# Patient Record
Sex: Female | Born: 1960 | Race: Black or African American | Hispanic: No | Smoking: Never smoker
Health system: Southern US, Community
[De-identification: ages and names within clinical notes are randomized; demographics above are authoritative.]

## PROBLEM LIST (undated history)

## (undated) ENCOUNTER — Emergency Department (HOSPITAL_COMMUNITY): Payer: Self-pay | Source: Home / Self Care

## (undated) DIAGNOSIS — Z9289 Personal history of other medical treatment: Secondary | ICD-10-CM

## (undated) DIAGNOSIS — I2510 Atherosclerotic heart disease of native coronary artery without angina pectoris: Secondary | ICD-10-CM

## (undated) DIAGNOSIS — N179 Acute kidney failure, unspecified: Secondary | ICD-10-CM

## (undated) DIAGNOSIS — I5042 Chronic combined systolic (congestive) and diastolic (congestive) heart failure: Secondary | ICD-10-CM

## (undated) DIAGNOSIS — D509 Iron deficiency anemia, unspecified: Secondary | ICD-10-CM

## (undated) DIAGNOSIS — K219 Gastro-esophageal reflux disease without esophagitis: Secondary | ICD-10-CM

## (undated) DIAGNOSIS — E785 Hyperlipidemia, unspecified: Secondary | ICD-10-CM

## (undated) DIAGNOSIS — N184 Chronic kidney disease, stage 4 (severe): Secondary | ICD-10-CM

## (undated) DIAGNOSIS — I10 Essential (primary) hypertension: Secondary | ICD-10-CM

## (undated) DIAGNOSIS — Z794 Long term (current) use of insulin: Secondary | ICD-10-CM

## (undated) DIAGNOSIS — Z8719 Personal history of other diseases of the digestive system: Secondary | ICD-10-CM

## (undated) DIAGNOSIS — E119 Type 2 diabetes mellitus without complications: Secondary | ICD-10-CM

## (undated) DIAGNOSIS — I213 ST elevation (STEMI) myocardial infarction of unspecified site: Secondary | ICD-10-CM

## (undated) DIAGNOSIS — I255 Ischemic cardiomyopathy: Secondary | ICD-10-CM

## (undated) HISTORY — PX: OOPHORECTOMY: SHX86

---

## 2005-05-25 ENCOUNTER — Ambulatory Visit: Payer: Self-pay | Admitting: Cardiology

## 2005-06-15 ENCOUNTER — Ambulatory Visit: Payer: Self-pay | Admitting: Cardiology

## 2011-11-24 DIAGNOSIS — R079 Chest pain, unspecified: Secondary | ICD-10-CM

## 2011-11-25 DIAGNOSIS — R079 Chest pain, unspecified: Secondary | ICD-10-CM

## 2011-12-01 DIAGNOSIS — R079 Chest pain, unspecified: Secondary | ICD-10-CM

## 2012-07-05 DIAGNOSIS — E119 Type 2 diabetes mellitus without complications: Secondary | ICD-10-CM

## 2012-08-23 DIAGNOSIS — I10 Essential (primary) hypertension: Secondary | ICD-10-CM | POA: Diagnosis present

## 2013-03-20 DIAGNOSIS — R002 Palpitations: Secondary | ICD-10-CM | POA: Insufficient documentation

## 2013-03-20 DIAGNOSIS — R9431 Abnormal electrocardiogram [ECG] [EKG]: Secondary | ICD-10-CM | POA: Insufficient documentation

## 2013-03-20 DIAGNOSIS — R0609 Other forms of dyspnea: Secondary | ICD-10-CM | POA: Insufficient documentation

## 2013-04-20 DIAGNOSIS — E559 Vitamin D deficiency, unspecified: Secondary | ICD-10-CM | POA: Insufficient documentation

## 2014-02-12 HISTORY — PX: THROAT SURGERY: SHX803

## 2014-02-27 DIAGNOSIS — D490 Neoplasm of unspecified behavior of digestive system: Secondary | ICD-10-CM | POA: Insufficient documentation

## 2015-03-16 DIAGNOSIS — Z8711 Personal history of peptic ulcer disease: Secondary | ICD-10-CM

## 2015-03-16 HISTORY — DX: Personal history of peptic ulcer disease: Z87.11

## 2015-12-30 DIAGNOSIS — K641 Second degree hemorrhoids: Secondary | ICD-10-CM | POA: Insufficient documentation

## 2017-02-12 DIAGNOSIS — N179 Acute kidney failure, unspecified: Secondary | ICD-10-CM

## 2017-02-12 DIAGNOSIS — I213 ST elevation (STEMI) myocardial infarction of unspecified site: Secondary | ICD-10-CM

## 2017-02-12 HISTORY — DX: Acute kidney failure, unspecified: N17.9

## 2017-02-12 HISTORY — DX: ST elevation (STEMI) myocardial infarction of unspecified site: I21.3

## 2017-03-10 ENCOUNTER — Other Ambulatory Visit (HOSPITAL_COMMUNITY): Payer: Self-pay

## 2017-03-10 ENCOUNTER — Inpatient Hospital Stay (HOSPITAL_COMMUNITY)
Admission: EM | Disposition: A | Discharge status: Home / Self Care | Payer: Self-pay | Source: Ambulatory Visit | Attending: Cardiology

## 2017-03-10 ENCOUNTER — Inpatient Hospital Stay (HOSPITAL_COMMUNITY)
Admission: EM | Admit: 2017-03-10 | Discharge: 2017-03-16 | DRG: 248 | Disposition: A | Discharge status: Home / Self Care | Payer: Medicare Other | Source: Ambulatory Visit | Attending: Cardiology | Admitting: Cardiology

## 2017-03-10 ENCOUNTER — Other Ambulatory Visit: Payer: Self-pay

## 2017-03-10 ENCOUNTER — Encounter (HOSPITAL_COMMUNITY): Payer: Self-pay | Admitting: Cardiology

## 2017-03-10 DIAGNOSIS — Z794 Long term (current) use of insulin: Secondary | ICD-10-CM

## 2017-03-10 DIAGNOSIS — I5041 Acute combined systolic (congestive) and diastolic (congestive) heart failure: Secondary | ICD-10-CM | POA: Diagnosis present

## 2017-03-10 DIAGNOSIS — I25110 Atherosclerotic heart disease of native coronary artery with unstable angina pectoris: Secondary | ICD-10-CM | POA: Diagnosis present

## 2017-03-10 DIAGNOSIS — R05 Cough: Secondary | ICD-10-CM | POA: Diagnosis present

## 2017-03-10 DIAGNOSIS — I255 Ischemic cardiomyopathy: Secondary | ICD-10-CM | POA: Diagnosis present

## 2017-03-10 DIAGNOSIS — N170 Acute kidney failure with tubular necrosis: Secondary | ICD-10-CM | POA: Diagnosis not present

## 2017-03-10 DIAGNOSIS — E876 Hypokalemia: Secondary | ICD-10-CM | POA: Diagnosis present

## 2017-03-10 DIAGNOSIS — I2102 ST elevation (STEMI) myocardial infarction involving left anterior descending coronary artery: Secondary | ICD-10-CM | POA: Diagnosis present

## 2017-03-10 DIAGNOSIS — Z888 Allergy status to other drugs, medicaments and biological substances status: Secondary | ICD-10-CM | POA: Diagnosis not present

## 2017-03-10 DIAGNOSIS — D72829 Elevated white blood cell count, unspecified: Secondary | ICD-10-CM | POA: Diagnosis present

## 2017-03-10 DIAGNOSIS — R059 Cough, unspecified: Secondary | ICD-10-CM

## 2017-03-10 DIAGNOSIS — I2510 Atherosclerotic heart disease of native coronary artery without angina pectoris: Secondary | ICD-10-CM

## 2017-03-10 DIAGNOSIS — I2582 Chronic total occlusion of coronary artery: Secondary | ICD-10-CM | POA: Diagnosis present

## 2017-03-10 DIAGNOSIS — Z880 Allergy status to penicillin: Secondary | ICD-10-CM

## 2017-03-10 DIAGNOSIS — I2109 ST elevation (STEMI) myocardial infarction involving other coronary artery of anterior wall: Secondary | ICD-10-CM | POA: Diagnosis present

## 2017-03-10 DIAGNOSIS — N990 Postprocedural (acute) (chronic) kidney failure: Secondary | ICD-10-CM

## 2017-03-10 DIAGNOSIS — E1159 Type 2 diabetes mellitus with other circulatory complications: Secondary | ICD-10-CM

## 2017-03-10 DIAGNOSIS — E785 Hyperlipidemia, unspecified: Secondary | ICD-10-CM | POA: Diagnosis present

## 2017-03-10 DIAGNOSIS — E119 Type 2 diabetes mellitus without complications: Secondary | ICD-10-CM

## 2017-03-10 DIAGNOSIS — Z955 Presence of coronary angioplasty implant and graft: Secondary | ICD-10-CM

## 2017-03-10 DIAGNOSIS — I5023 Acute on chronic systolic (congestive) heart failure: Secondary | ICD-10-CM | POA: Diagnosis present

## 2017-03-10 DIAGNOSIS — Z8249 Family history of ischemic heart disease and other diseases of the circulatory system: Secondary | ICD-10-CM

## 2017-03-10 DIAGNOSIS — I110 Hypertensive heart disease with heart failure: Secondary | ICD-10-CM | POA: Diagnosis present

## 2017-03-10 DIAGNOSIS — E118 Type 2 diabetes mellitus with unspecified complications: Secondary | ICD-10-CM | POA: Diagnosis not present

## 2017-03-10 DIAGNOSIS — E11649 Type 2 diabetes mellitus with hypoglycemia without coma: Secondary | ICD-10-CM | POA: Diagnosis present

## 2017-03-10 DIAGNOSIS — R053 Chronic cough: Secondary | ICD-10-CM | POA: Diagnosis present

## 2017-03-10 DIAGNOSIS — R110 Nausea: Secondary | ICD-10-CM | POA: Diagnosis present

## 2017-03-10 DIAGNOSIS — D649 Anemia, unspecified: Secondary | ICD-10-CM | POA: Diagnosis present

## 2017-03-10 DIAGNOSIS — I10 Essential (primary) hypertension: Secondary | ICD-10-CM | POA: Diagnosis present

## 2017-03-10 DIAGNOSIS — Z91018 Allergy to other foods: Secondary | ICD-10-CM

## 2017-03-10 DIAGNOSIS — Z833 Family history of diabetes mellitus: Secondary | ICD-10-CM

## 2017-03-10 DIAGNOSIS — Z9189 Other specified personal risk factors, not elsewhere classified: Secondary | ICD-10-CM

## 2017-03-10 DIAGNOSIS — Z79899 Other long term (current) drug therapy: Secondary | ICD-10-CM

## 2017-03-10 DIAGNOSIS — D61818 Other pancytopenia: Secondary | ICD-10-CM | POA: Diagnosis present

## 2017-03-10 DIAGNOSIS — E782 Mixed hyperlipidemia: Secondary | ICD-10-CM | POA: Diagnosis present

## 2017-03-10 HISTORY — DX: Type 2 diabetes mellitus without complications: E11.9

## 2017-03-10 HISTORY — PX: CORONARY/GRAFT ACUTE MI REVASCULARIZATION: CATH118305

## 2017-03-10 HISTORY — DX: Hyperlipidemia, unspecified: E78.5

## 2017-03-10 HISTORY — DX: Type 2 diabetes mellitus without complications: Z79.4

## 2017-03-10 HISTORY — DX: Essential (primary) hypertension: I10

## 2017-03-10 HISTORY — PX: LEFT HEART CATH AND CORONARY ANGIOGRAPHY: CATH118249

## 2017-03-10 LAB — CBC
HCT: 34.7 % — ABNORMAL LOW (ref 36.0–46.0)
HEMATOCRIT: 34.3 % — AB (ref 36.0–46.0)
Hemoglobin: 10.8 g/dL — ABNORMAL LOW (ref 12.0–15.0)
Hemoglobin: 11.1 g/dL — ABNORMAL LOW (ref 12.0–15.0)
MCH: 26.3 pg (ref 26.0–34.0)
MCH: 26.4 pg (ref 26.0–34.0)
MCHC: 31.5 g/dL (ref 30.0–36.0)
MCHC: 32 g/dL (ref 30.0–36.0)
MCV: 83.5 fL (ref 78.0–100.0)
MCV: 82.6 fL (ref 78.0–100.0)
PLATELETS: 229 10*3/uL (ref 150–400)
Platelets: 244 10*3/uL (ref 150–400)
RBC: 4.11 MIL/uL (ref 3.87–5.11)
RBC: 4.2 MIL/uL (ref 3.87–5.11)
RDW: 14.1 % (ref 11.5–15.5)
RDW: 14.2 % (ref 11.5–15.5)
WBC: 15.6 10*3/uL — AB (ref 4.0–10.5)
WBC: 15.6 10*3/uL — ABNORMAL HIGH (ref 4.0–10.5)

## 2017-03-10 LAB — BASIC METABOLIC PANEL
ANION GAP: 13 (ref 5–15)
BUN: 19 mg/dL (ref 6–20)
CO2: 19 mmol/L — AB (ref 22–32)
Calcium: 8.7 mg/dL — ABNORMAL LOW (ref 8.9–10.3)
Chloride: 101 mmol/L (ref 101–111)
Creatinine, Ser: 1.5 mg/dL — ABNORMAL HIGH (ref 0.44–1.00)
GFR, EST AFRICAN AMERICAN: 44 mL/min — AB (ref >60)
GFR, EST NON AFRICAN AMERICAN: 38 mL/min — AB (ref >60)
GLUCOSE: 326 mg/dL — AB (ref 65–99)
POTASSIUM: 4.7 mmol/L (ref 3.5–5.1)
Sodium: 133 mmol/L — ABNORMAL LOW (ref 135–145)

## 2017-03-10 LAB — MAGNESIUM: Magnesium: 1.7 mg/dL (ref 1.7–2.4)

## 2017-03-10 LAB — HEMOGLOBIN A1C
HEMOGLOBIN A1C: 8.6 % — AB (ref 4.8–5.6)
MEAN PLASMA GLUCOSE: 200.12 mg/dL

## 2017-03-10 LAB — GLUCOSE, CAPILLARY
GLUCOSE-CAPILLARY: 348 mg/dL — AB (ref 65–99)
GLUCOSE-CAPILLARY: 130 mg/dL — AB (ref 65–99)
Glucose-Capillary: 346 mg/dL — ABNORMAL HIGH (ref 65–99)
Glucose-Capillary: 388 mg/dL — ABNORMAL HIGH (ref 65–99)

## 2017-03-10 LAB — CREATININE, SERUM
CREATININE: 1.35 mg/dL — AB (ref 0.44–1.00)
GFR, EST AFRICAN AMERICAN: 49 mL/min — AB (ref >60)
GFR, EST NON AFRICAN AMERICAN: 43 mL/min — AB (ref >60)

## 2017-03-10 LAB — MRSA PCR SCREENING: MRSA by PCR: NEGATIVE

## 2017-03-10 LAB — LIPID PANEL
CHOL/HDL RATIO: 4.3 ratio
Cholesterol: 204 mg/dL — ABNORMAL HIGH (ref 0–200)
HDL: 47 mg/dL (ref >40)
LDL CALC: 149 mg/dL — AB (ref 0–99)
Triglycerides: 41 mg/dL (ref <150)
VLDL: 8 mg/dL (ref 0–40)

## 2017-03-10 LAB — POCT ACTIVATED CLOTTING TIME: Activated Clotting Time: 279 seconds

## 2017-03-10 SURGERY — LEFT HEART CATH AND CORONARY ANGIOGRAPHY
Anesthesia: LOCAL

## 2017-03-10 MED ORDER — CARVEDILOL 12.5 MG PO TABS
12.5000 mg | ORAL_TABLET | Freq: Two times a day (BID) | ORAL | Status: DC
  Administered 2017-03-10 – 2017-03-11 (×2): 12.5 mg via ORAL
  Filled 2017-03-10 (×3): qty 1

## 2017-03-10 MED ORDER — TIROFIBAN HCL IN NACL 5-0.9 MG/100ML-% IV SOLN
INTRAVENOUS | Status: AC | PRN
  Administered 2017-03-10: 0.15 ug/kg/min via INTRAVENOUS

## 2017-03-10 MED ORDER — NITROGLYCERIN 1 MG/10 ML FOR IR/CATH LAB
INTRA_ARTERIAL | Status: AC
  Filled 2017-03-10: qty 10

## 2017-03-10 MED ORDER — TICAGRELOR 90 MG PO TABS
ORAL_TABLET | ORAL | Status: DC | PRN
  Administered 2017-03-10: 180 mg via ORAL

## 2017-03-10 MED ORDER — INSULIN ASPART 100 UNIT/ML ~~LOC~~ SOLN
0.0000 [IU] | Freq: Three times a day (TID) | SUBCUTANEOUS | Status: DC
Start: 2017-03-10 — End: 2017-03-16
  Administered 2017-03-10: 15 [IU] via SUBCUTANEOUS
  Administered 2017-03-11: 5 [IU] via SUBCUTANEOUS
  Administered 2017-03-11: 3 [IU] via SUBCUTANEOUS
  Administered 2017-03-12 – 2017-03-13 (×4): 2 [IU] via SUBCUTANEOUS
  Administered 2017-03-14 (×2): 5 [IU] via SUBCUTANEOUS
  Administered 2017-03-15 – 2017-03-16 (×3): 2 [IU] via SUBCUTANEOUS
  Administered 2017-03-16: 13 [IU] via SUBCUTANEOUS

## 2017-03-10 MED ORDER — HYDRALAZINE HCL 20 MG/ML IJ SOLN: 5.0000 mg | INTRAMUSCULAR | Status: AC | PRN

## 2017-03-10 MED ORDER — LABETALOL HCL 5 MG/ML IV SOLN: 10.0000 mg | INTRAVENOUS | Status: AC | PRN

## 2017-03-10 MED ORDER — SODIUM CHLORIDE 0.9% FLUSH: 3.0000 mL | INTRAVENOUS | Status: DC | PRN

## 2017-03-10 MED ORDER — PANTOPRAZOLE SODIUM 40 MG PO TBEC
40.0000 mg | DELAYED_RELEASE_TABLET | Freq: Every day | ORAL | Status: DC
  Administered 2017-03-10 – 2017-03-16 (×8): 40 mg via ORAL
  Filled 2017-03-10 (×7): qty 1

## 2017-03-10 MED ORDER — TICAGRELOR 90 MG PO TABS
ORAL_TABLET | ORAL | Status: AC
  Filled 2017-03-10: qty 2

## 2017-03-10 MED ORDER — SODIUM CHLORIDE 0.9 % WEIGHT BASED INFUSION
1.0000 mL/kg/h | INTRAVENOUS | Status: AC
  Administered 2017-03-10: 1 mL/kg/h via INTRAVENOUS

## 2017-03-10 MED ORDER — VITAMIN D (ERGOCALCIFEROL) 1.25 MG (50000 UNIT) PO CAPS
50000.0000 [IU] | ORAL_CAPSULE | ORAL | Status: DC
  Administered 2017-03-11: 50000 [IU] via ORAL
  Filled 2017-03-10: qty 1

## 2017-03-10 MED ORDER — TIROFIBAN HCL IN NACL 5-0.9 MG/100ML-% IV SOLN
INTRAVENOUS | Status: AC
  Filled 2017-03-10: qty 100

## 2017-03-10 MED ORDER — TIROFIBAN HCL IN NACL 5-0.9 MG/100ML-% IV SOLN
0.1500 ug/kg/min | INTRAVENOUS | Status: AC
  Administered 2017-03-10 – 2017-03-11 (×4): 0.15 ug/kg/min via INTRAVENOUS
  Filled 2017-03-10 (×4): qty 100

## 2017-03-10 MED ORDER — SODIUM CHLORIDE 0.9 % IV SOLN: 250.0000 mL | INTRAVENOUS | Status: DC | PRN

## 2017-03-10 MED ORDER — NITROGLYCERIN IN D5W 200-5 MCG/ML-% IV SOLN
INTRAVENOUS | Status: AC | PRN
  Administered 2017-03-10: 10 ug/min via INTRAVENOUS

## 2017-03-10 MED ORDER — ENOXAPARIN SODIUM 40 MG/0.4ML ~~LOC~~ SOLN
40.0000 mg | SUBCUTANEOUS | Status: DC
  Administered 2017-03-11 – 2017-03-16 (×5): 40 mg via SUBCUTANEOUS
  Filled 2017-03-10 (×5): qty 0.4

## 2017-03-10 MED ORDER — TIROFIBAN (AGGRASTAT) BOLUS VIA INFUSION
INTRAVENOUS | Status: DC | PRN
  Administered 2017-03-10: 2665 ug via INTRAVENOUS

## 2017-03-10 MED ORDER — NITROGLYCERIN 1 MG/10 ML FOR IR/CATH LAB
INTRA_ARTERIAL | Status: DC | PRN
  Administered 2017-03-10 (×2): 200 ug via INTRACORONARY

## 2017-03-10 MED ORDER — TICAGRELOR 90 MG PO TABS
90.0000 mg | ORAL_TABLET | Freq: Two times a day (BID) | ORAL | Status: DC
  Administered 2017-03-10 – 2017-03-16 (×12): 90 mg via ORAL
  Filled 2017-03-10 (×14): qty 1

## 2017-03-10 MED ORDER — NITROGLYCERIN IN D5W 200-5 MCG/ML-% IV SOLN
INTRAVENOUS | Status: AC
  Filled 2017-03-10: qty 250

## 2017-03-10 MED ORDER — ONDANSETRON HCL 4 MG/2ML IJ SOLN
4.0000 mg | Freq: Four times a day (QID) | INTRAMUSCULAR | Status: DC | PRN
  Administered 2017-03-10 – 2017-03-11 (×2): 4 mg via INTRAVENOUS
  Filled 2017-03-10 (×3): qty 2

## 2017-03-10 MED ORDER — PROMETHAZINE HCL 25 MG/ML IJ SOLN
25.0000 mg | Freq: Four times a day (QID) | INTRAMUSCULAR | Status: AC | PRN
  Administered 2017-03-10: 25 mg via INTRAVENOUS
  Filled 2017-03-10: qty 1

## 2017-03-10 MED ORDER — VERAPAMIL HCL 2.5 MG/ML IV SOLN
INTRAVENOUS | Status: DC | PRN
  Administered 2017-03-10: 10 mL via INTRA_ARTERIAL

## 2017-03-10 MED ORDER — LIDOCAINE HCL (PF) 1 % IJ SOLN
INTRAMUSCULAR | Status: DC | PRN
  Administered 2017-03-10: 2 mL via SUBCUTANEOUS

## 2017-03-10 MED ORDER — HEPARIN SODIUM (PORCINE) 1000 UNIT/ML IJ SOLN
INTRAMUSCULAR | Status: DC | PRN
  Administered 2017-03-10: 10000 [IU] via INTRAVENOUS

## 2017-03-10 MED ORDER — SODIUM CHLORIDE 0.9% FLUSH
3.0000 mL | Freq: Two times a day (BID) | INTRAVENOUS | Status: DC
  Administered 2017-03-10 – 2017-03-16 (×11): 3 mL via INTRAVENOUS

## 2017-03-10 MED ORDER — ASPIRIN 81 MG PO CHEW
81.0000 mg | CHEWABLE_TABLET | Freq: Every day | ORAL | Status: DC
  Administered 2017-03-11 – 2017-03-16 (×6): 81 mg via ORAL
  Filled 2017-03-10 (×7): qty 1

## 2017-03-10 MED ORDER — HEPARIN (PORCINE) IN NACL 2-0.9 UNIT/ML-% IJ SOLN
INTRAMUSCULAR | Status: AC
  Filled 2017-03-10: qty 1000

## 2017-03-10 MED ORDER — LIDOCAINE HCL (PF) 1 % IJ SOLN
INTRAMUSCULAR | Status: AC
  Filled 2017-03-10: qty 30

## 2017-03-10 MED ORDER — ATORVASTATIN CALCIUM 80 MG PO TABS
80.0000 mg | ORAL_TABLET | Freq: Every day | ORAL | Status: DC
  Administered 2017-03-10 – 2017-03-16 (×7): 80 mg via ORAL
  Filled 2017-03-10 (×8): qty 1

## 2017-03-10 MED ORDER — IOPAMIDOL (ISOVUE-370) INJECTION 76%
INTRAVENOUS | Status: AC
  Filled 2017-03-10: qty 125

## 2017-03-10 MED ORDER — ACETAMINOPHEN 325 MG PO TABS
650.0000 mg | ORAL_TABLET | ORAL | Status: DC | PRN
  Administered 2017-03-10 – 2017-03-15 (×5): 650 mg via ORAL
  Filled 2017-03-10 (×5): qty 2

## 2017-03-10 MED ORDER — NITROGLYCERIN IN D5W 200-5 MCG/ML-% IV SOLN: 0.0000 ug/min | INTRAVENOUS | Status: DC

## 2017-03-10 MED ORDER — INSULIN ASPART 100 UNIT/ML ~~LOC~~ SOLN
10.0000 [IU] | Freq: Three times a day (TID) | SUBCUTANEOUS | Status: DC
  Administered 2017-03-10 – 2017-03-16 (×16): 10 [IU] via SUBCUTANEOUS

## 2017-03-10 MED ORDER — INSULIN ASPART 100 UNIT/ML ~~LOC~~ SOLN: 0.0000 [IU] | Freq: Three times a day (TID) | SUBCUTANEOUS | Status: DC

## 2017-03-10 MED ORDER — IOPAMIDOL (ISOVUE-370) INJECTION 76%
INTRAVENOUS | Status: AC
  Filled 2017-03-10: qty 100

## 2017-03-10 MED ORDER — HEPARIN SODIUM (PORCINE) 1000 UNIT/ML IJ SOLN
INTRAMUSCULAR | Status: AC
  Filled 2017-03-10: qty 1

## 2017-03-10 MED ORDER — INSULIN DETEMIR 100 UNIT/ML ~~LOC~~ SOLN
50.0000 [IU] | Freq: Two times a day (BID) | SUBCUTANEOUS | Status: DC
  Administered 2017-03-10 – 2017-03-13 (×6): 50 [IU] via SUBCUTANEOUS
  Filled 2017-03-10 (×7): qty 0.5

## 2017-03-10 MED ORDER — VERAPAMIL HCL 2.5 MG/ML IV SOLN
INTRAVENOUS | Status: DC | PRN
  Administered 2017-03-10: 200 ug via INTRACORONARY

## 2017-03-10 MED ORDER — ENALAPRIL MALEATE 20 MG PO TABS
20.0000 mg | ORAL_TABLET | Freq: Two times a day (BID) | ORAL | Status: DC
  Administered 2017-03-10 – 2017-03-13 (×7): 20 mg via ORAL
  Filled 2017-03-10 (×10): qty 1

## 2017-03-10 SURGICAL SUPPLY — 17 items
BALLN SAPPHIRE 2.0X12 (BALLOONS) ×2
BALLN SAPPHIRE 2.5X12 (BALLOONS) ×2
BALLOON SAPPHIRE 2.0X12 (BALLOONS) IMPLANT
BALLOON SAPPHIRE 2.5X12 (BALLOONS) IMPLANT
CATH 5FR JL3.5 JR4 ANG PIG MP (CATHETERS) ×1 IMPLANT
CATH VISTA GUIDE 6FR XBLAD3.5 (CATHETERS) ×1 IMPLANT
DEVICE RAD COMP TR BAND LRG (VASCULAR PRODUCTS) ×1 IMPLANT
GLIDESHEATH SLEND SS 6F .021 (SHEATH) ×1 IMPLANT
GUIDEWIRE INQWIRE 1.5J.035X260 (WIRE) IMPLANT
INQWIRE 1.5J .035X260CM (WIRE) ×2
KIT ENCORE 26 ADVANTAGE (KITS) ×1 IMPLANT
KIT HEART LEFT (KITS) ×2 IMPLANT
PACK CARDIAC CATHETERIZATION (CUSTOM PROCEDURE TRAY) ×2 IMPLANT
STENT SYNERGY DES 3X32 (Permanent Stent) ×1 IMPLANT
TRANSDUCER W/STOPCOCK (MISCELLANEOUS) ×2 IMPLANT
TUBING CIL FLEX 10 FLL-RA (TUBING) ×2 IMPLANT
WIRE ASAHI PROWATER 180CM (WIRE) ×1 IMPLANT

## 2017-03-10 NOTE — Progress Notes (Signed)
Dr. [name] paged regarding various issues. Patient had a 15 beat run vtach was asymptomatic. CBGs have been 348-388. Right radial cath site has a continuous slow oozing. Dr. [name] paged and made aware and new orders received. Will implement and continue to monitor.

## 2017-03-10 NOTE — H&P (Signed)
History & Physical    Patient ID: Stephanie Manning MRN: [id number], DOB/AGE: [birth year]   Admit date: 03/10/2017   Primary Physician: No primary care provider on file. Primary Cardiologist: New  Patient Profile    56 yo female with PMH of IDDM, HL, HTN, anemia who presented to [facility] with chest pain.   Past Medical History   Past Medical History:  Diagnosis Date  . Anemia   . Diabetes mellitus, type II, insulin dependent ([place])   . Hyperlipidemia   . Hypertension      Allergies  Allergies  Allergen Reactions  . Amoxicillin Other (See Comments)    Yeast infection during treatment  . Hydrocodone Rash    Palpatations, gi upset  . Lisinopril Other (See Comments)    Chest pain, palpatations  . Meloxicam Nausea Only    History of Present Illness    Stephanie Manning is a 56 yo female with PMH of IDDM, HL, HTN, anemia. She reports being followed by her PCP for chronic illnesses. Remote hx of stress test in the past. She presented to [facility] earlier this morning with chest pain. Reports developing SScp around midnight. Symptoms persisted and she presented to the ED there around 4am. Initial EKG at 0409am showed SR with ST changes in the V2-V3 that were somewhat similar to previous EKG in 12/17.  Troponin was 0.10. She was placed on IV heparin. Labs showed ProBNP 2215, Cr 1.2, Hgb 10.7. CXR reported concern for edema. Repeat EKG at 0801am showed worsening ST elevation in v2-v3 and she continued to have ongoing chest pain. Code STEMI was called and she was transferred to [facility] emergently for cardiac cath. She reported at 10/10 chest pain on arrival.   Home Medications    Prior to Admission medications   Not on File    Family History    Family History  Problem Relation Age of Onset  . Hypertension Mother   . Heart disease Mother   . Diabetes Mother   . Stroke Mother     Social History    Social History   Socioeconomic History  . Marital status: Single    Spouse name: Not on file  . Number of children: Not on file  . Years of education: Not on file  . Highest education level: Not on file  Social Needs  . Financial resource strain: Not on file  . Food insecurity - worry: Not on file  . Food insecurity - inability: Not on file  . Transportation needs - medical: Not on file  . Transportation needs - non-medical: Not on file  Occupational History  . Not on file  Tobacco Use  . Smoking status: Former Smoker  Substance and Sexual Activity  . Alcohol use: Not on file  . Drug use: Not on file  . Sexual activity: Not on file  Other Topics Concern  . Not on file  Social History Narrative  . Not on file     Review of Systems    See HPI  All other systems reviewed and are otherwise negative except as noted above.  Physical Exam    Height 5\' 7"  (1.702 m), weight 235 lb (106.6 kg), SpO2 99 %.  General: AAF, pale and diaphoretic  Psych: Normal affect. Neuro: Alert and oriented X 3. Moves all extremities spontaneously. HEENT: Normal  Neck: Supple without bruits or JVD. Lungs:  Resp regular and unlabored, CTA. Heart: RRR no s3, s4, or murmurs. Abdomen: Soft, non-tender, non-distended, BS +  x 4.  Extremities: No clubbing, cyanosis or edema. DP/PT/Radials 2+ and equal bilaterally.  Labs    Troponin (Point of Care Test) No results for input(s): TROPIPOC in the last 72 hours. No results for input(s): CKTOTAL, CKMB, TROPONINI in the last 72 hours. No results found for: WBC, HGB, HCT, MCV, PLT No results for input(s): NA, K, CL, CO2, BUN, CREATININE, CALCIUM, PROT, BILITOT, ALKPHOS, ALT, AST, GLUCOSE in the last 168 hours.  Invalid input(s): LABALBU No results found for: CHOL, HDL, LDLCALC, TRIG No results found for: [facility]   Radiology Studies    No results found.  ECG & Cardiac Imaging    EKG: 0801am SR with with ST elevation in v2-v3  Assessment & Plan    56 yo female with PMH of IDDM, HL, HTN, anemia who presented to [facility] with chest pain.   1. STEMI: Developed chest pain around midnight and presented to [facility]. EKG there showed ST elevation in v2-v3, CODE STEMI called and transferred to [facility] for emergent cath. She was given 324 ASA and 4000 heparin bolus prior to transfer. Further recommendations post cath.   2. IDDM: Check Hgb A1c -- SSI while inpatient  3. HTN: On ACEi, amlodipine and BB as outpatient. Rec's post cath.  4. HL: Reports a hx of same, but was not on any medications at home.  -- check lipids -- add high dose statin  4. Anemia: Hgb 10.7 on admission -- follow CBC  5. Acute systolic HF: CXR with pulmonary edema, and elevated ProBNP at OSH. Given IV lasix prior to admission.  -- may need additional therapy post cath  Signed, Reino Bellis, NP-C Pager [phone] 03/10/2017, 9:38 AM

## 2017-03-11 ENCOUNTER — Inpatient Hospital Stay (HOSPITAL_COMMUNITY): Payer: Medicare Other

## 2017-03-11 ENCOUNTER — Encounter (HOSPITAL_COMMUNITY): Payer: Self-pay | Admitting: Cardiology

## 2017-03-11 DIAGNOSIS — R053 Chronic cough: Secondary | ICD-10-CM | POA: Diagnosis present

## 2017-03-11 DIAGNOSIS — R05 Cough: Secondary | ICD-10-CM

## 2017-03-11 DIAGNOSIS — I361 Nonrheumatic tricuspid (valve) insufficiency: Secondary | ICD-10-CM

## 2017-03-11 DIAGNOSIS — I25110 Atherosclerotic heart disease of native coronary artery with unstable angina pectoris: Secondary | ICD-10-CM | POA: Diagnosis present

## 2017-03-11 DIAGNOSIS — R110 Nausea: Secondary | ICD-10-CM | POA: Diagnosis present

## 2017-03-11 DIAGNOSIS — E785 Hyperlipidemia, unspecified: Secondary | ICD-10-CM

## 2017-03-11 LAB — CBC
HEMATOCRIT: 33.5 % — AB (ref 36.0–46.0)
Hemoglobin: 10.7 g/dL — ABNORMAL LOW (ref 12.0–15.0)
MCH: 26.4 pg (ref 26.0–34.0)
MCHC: 31.9 g/dL (ref 30.0–36.0)
MCV: 82.7 fL (ref 78.0–100.0)
Platelets: 242 10*3/uL (ref 150–400)
RBC: 4.05 MIL/uL (ref 3.87–5.11)
RDW: 14.5 % (ref 11.5–15.5)
WBC: 17 10*3/uL — AB (ref 4.0–10.5)

## 2017-03-11 LAB — GLUCOSE, CAPILLARY
GLUCOSE-CAPILLARY: 134 mg/dL — AB (ref 65–99)
Glucose-Capillary: 234 mg/dL — ABNORMAL HIGH (ref 65–99)
Glucose-Capillary: 160 mg/dL — ABNORMAL HIGH (ref 65–99)
Glucose-Capillary: 74 mg/dL (ref 65–99)

## 2017-03-11 LAB — BASIC METABOLIC PANEL
ANION GAP: 13 (ref 5–15)
BUN: 20 mg/dL (ref 6–20)
CHLORIDE: 100 mmol/L — AB (ref 101–111)
CO2: 22 mmol/L (ref 22–32)
Calcium: 9 mg/dL (ref 8.9–10.3)
Creatinine, Ser: 1.4 mg/dL — ABNORMAL HIGH (ref 0.44–1.00)
GFR, EST AFRICAN AMERICAN: 47 mL/min — AB (ref >60)
GFR, EST NON AFRICAN AMERICAN: 41 mL/min — AB (ref >60)
Glucose, Bld: 229 mg/dL — ABNORMAL HIGH (ref 65–99)
POTASSIUM: 3.4 mmol/L — AB (ref 3.5–5.1)
SODIUM: 135 mmol/L (ref 135–145)

## 2017-03-11 LAB — TROPONIN I
TROPONIN I: 17.49 ng/mL — AB (ref <0.03)
TROPONIN I: 17.88 ng/mL — AB (ref <0.03)

## 2017-03-11 LAB — ECHOCARDIOGRAM COMPLETE
Height: 67 in
Weight: 3814.4 oz

## 2017-03-11 MED ORDER — PERFLUTREN LIPID MICROSPHERE
INTRAVENOUS | Status: AC
  Administered 2017-03-11: 3 mL via INTRAVENOUS
  Filled 2017-03-11: qty 10

## 2017-03-11 MED ORDER — POTASSIUM CHLORIDE CRYS ER 20 MEQ PO TBCR
20.0000 meq | EXTENDED_RELEASE_TABLET | Freq: Once | ORAL | Status: AC
  Administered 2017-03-11: 20 meq via ORAL
  Filled 2017-03-11: qty 1

## 2017-03-11 MED ORDER — PERFLUTREN LIPID MICROSPHERE
3.0000 mL | INTRAVENOUS | Status: AC | PRN
  Administered 2017-03-11: 3 mL via INTRAVENOUS
  Filled 2017-03-11 (×2): qty 4

## 2017-03-11 MED ORDER — PROMETHAZINE HCL 25 MG/ML IJ SOLN
12.5000 mg | Freq: Four times a day (QID) | INTRAMUSCULAR | Status: DC | PRN
  Administered 2017-03-11 – 2017-03-12 (×5): 12.5 mg via INTRAVENOUS
  Filled 2017-03-11 (×5): qty 1

## 2017-03-11 MED ORDER — METOPROLOL TARTRATE 5 MG/5ML IV SOLN
5.0000 mg | Freq: Four times a day (QID) | INTRAVENOUS | Status: DC | PRN
  Administered 2017-03-11: 5 mg via INTRAVENOUS
  Filled 2017-03-11: qty 5

## 2017-03-11 MED ORDER — FUROSEMIDE 10 MG/ML IJ SOLN
20.0000 mg | Freq: Once | INTRAMUSCULAR | Status: AC
  Administered 2017-03-11: 20 mg via INTRAVENOUS
  Filled 2017-03-11: qty 2

## 2017-03-11 MED ORDER — HYDRALAZINE HCL 20 MG/ML IJ SOLN
10.0000 mg | Freq: Four times a day (QID) | INTRAMUSCULAR | Status: DC | PRN
  Administered 2017-03-11 – 2017-03-12 (×6): 10 mg via INTRAVENOUS
  Filled 2017-03-11 (×6): qty 1

## 2017-03-11 MED ORDER — POTASSIUM CHLORIDE CRYS ER 20 MEQ PO TBCR
40.0000 meq | EXTENDED_RELEASE_TABLET | Freq: Once | ORAL | Status: AC
  Administered 2017-03-11: 40 meq via ORAL
  Filled 2017-03-11: qty 2

## 2017-03-11 MED ORDER — CARVEDILOL 6.25 MG PO TABS
18.7500 mg | ORAL_TABLET | Freq: Two times a day (BID) | ORAL | Status: DC
  Administered 2017-03-11 – 2017-03-16 (×11): 18.75 mg via ORAL
  Filled 2017-03-11 (×11): qty 1

## 2017-03-11 MED FILL — Heparin Sodium (Porcine) 2 Unit/ML in Sodium Chloride 0.9%: INTRAMUSCULAR | Qty: 500 | Status: AC

## 2017-03-11 NOTE — Progress Notes (Signed)
Received order to administer 10 mg IV Hydralazine Q6 PRN for SBP>160 per MD Philip at [phone]. Will administer and continue to monitor.

## 2017-03-11 NOTE — Progress Notes (Signed)
CRITICAL VALUE ALERT  Critical Value: tropnin 17.49  Date & Time Notied:   03/11/2017 Provider Notified:1430  Orders Received/Actions taken:

## 2017-03-11 NOTE — Progress Notes (Signed)
Came to try to ambulate however pt feels poorly. C/o SOB, persistent cough, feeling hot, blood tinged phlegm. Seems to not be able to get comfortable. Gave her MI book, stent card, and DM diet to begin reading. Discussed Brilinta as well. Will f/u tomorrow. [phone] Yves Dill CES, ACSM 3:00 PM 03/11/2017

## 2017-03-11 NOTE — Progress Notes (Addendum)
Progress Note  Patient Name: Stephanie Manning Date of Encounter: 03/11/2017  Primary Cardiologist: No primary care provider on file.   Patient Profile     56 y.o. female with no prior history of CAD who was transferred from [facility] for an anterior STEMI.  She does have history of diabetes mellitus type 2 on insulin, hypertension and hyperlipidemia. Her STEMI anginal pain consult was consistent with acute severe chest pain radiating to the ear and jaw associated with nausea and vomiting.  Initial EKG was relatively unremarkable, however troponin was slightly elevated and second EKG was more consistent with ST elevations.  She was then brought to the proximal, Cath Lab.  Cath showed 100% proximal-mid LAD treated with DES stent.  She also has distal LAD occlusion at the apex along with 80% RCA as well as 90% distal circumflex disease.  Subjective   No further chest pain.  Has had nausea and persistent cough - both began prior to onset of chest pain.  He Received hydralazine this morning for elevated blood pressure.  Pressure is now better, rate is still borderline tachycardic.  Inpatient Medications    Scheduled Meds: . aspirin  81 mg Oral Daily  . atorvastatin  80 mg Oral q1800  . carvedilol  12.5 mg Oral BID WC  . enalapril  20 mg Oral BID  . enoxaparin (LOVENOX) injection  40 mg Subcutaneous Q24H  . insulin aspart  0-15 Units Subcutaneous TID WC  . insulin aspart  10 Units Subcutaneous TID WC  . insulin detemir  50 Units Subcutaneous BID  . pantoprazole  40 mg Oral Daily  . sodium chloride flush  3 mL Intravenous Q12H  . ticagrelor  90 mg Oral BID  . Vitamin D (Ergocalciferol)  50,000 Units Oral Q7 days   Continuous Infusions: . sodium chloride    . nitroGLYCERIN Stopped (03/11/17 0317)   PRN Meds: sodium chloride, acetaminophen, hydrALAZINE, promethazine, sodium chloride flush   Vital Signs    Vitals:   03/11/17 0700 03/11/17 0800 03/11/17 0808 03/11/17  0900  BP: 101/88 (!) 190/95 (!) 190/95 (!) 180/108  Pulse: 98 91 96 (!) 102  Resp: (!) 22 (!) 27 19 (!) 32  Temp:   99.1 F (37.3 C)   TempSrc:   Oral   SpO2: 97% 92% 95% 95%  Weight:      Height:        Intake/Output Summary (Last 24 hours) at 03/11/2017 1126 Last data filed at 03/11/2017 0900 Gross per 24 hour  Intake 1629.32 ml  Output 2553 ml  Net -923.68 ml   Filed Weights   03/10/17 0918 03/11/17 0530  Weight: 235 lb (106.6 kg) 238 lb 6.4 oz (108.1 kg)    Telemetry    SR-STachy (90s-low 100s) - Personally Reviewed  ECG    Sinus rhythm, rate 88 bpm.  Evolving changes of her anterior ST elevation MI with lateral T wave inversions (appears ischemic) and now biphasic ST elevation with T wave inversion and persistent LC elevations in V1 and V2- Personally Reviewed  Physical Exam   Physical Exam  Constitutional: She is oriented to person, place, and time. She appears well-developed and well-nourished. No distress.  Obese  HENT:  Head: Normocephalic and atraumatic.  Eyes: EOM are normal.  Neck: No hepatojugular reflux and no JVD present. Carotid bruit is not present.  Cardiovascular: Regular rhythm, normal heart sounds and normal pulses.  No extrasystoles are present. Tachycardia present. PMI is not displaced (difficult  to palpate). Exam reveals no gallop, no friction rub and no decreased pulses.  No murmur heard. Pulmonary/Chest: Effort normal and breath sounds normal. No respiratory distress. She has no wheezes. She has no rales.  Frequent coughing  Abdominal: Soft. Bowel sounds are normal. She exhibits no distension. There is no tenderness. There is no rebound.  Musculoskeletal: Normal range of motion. She exhibits no edema.  Neurological: She is alert and oriented to person, place, and time.  Psychiatric: She has a normal mood and affect. Her behavior is normal. Judgment and thought content normal.  A bit groggy from sedation (Phenergan)  Nursing note and vitals  reviewed.   Labs    Chemistry Recent Labs  Lab 03/10/17 1227 03/10/17 1731 03/11/17 0354  NA  --  133* 135  K  --  4.7 3.4*  CL  --  101 100*  CO2  --  19* 22  GLUCOSE  --  326* 229*  BUN  --  19 20  CREATININE 1.35* 1.50* 1.40*  CALCIUM  --  8.7* 9.0  GFRNONAA 43* 38* 41*  GFRAA 49* 44* 47*  ANIONGAP  --  13 13     Hematology Recent Labs  Lab 03/10/17 1227 03/10/17 1632 03/11/17 0354  WBC 15.6* 15.6* 17.0*  RBC 4.11 4.20 4.05  HGB 10.8* 11.1* 10.7*  HCT 34.3* 34.7* 33.5*  MCV 83.5 82.6 82.7  MCH 26.3 26.4 26.4  MCHC 31.5 32.0 31.9  RDW 14.1 14.2 14.5  PLT 244 229 242    Cardiac EnzymesNo results for input(s): TROPONINI in the last 168 hours. No results for input(s): TROPIPOC in the last 168 hours.   BNPNo results for input(s): BNP, PROBNP in the last 168 hours.   DDimer No results for input(s): DDIMER in the last 168 hours.   Radiology    No results found.  Cardiac Studies    Left Heart Cath/Coronary Angiography/PCI 03/10/2017: PCI pLAD 100% STENT SYNERGY DES 3X32   1. 3 vessel obstructive CAD    - 100% mid LAD occlusion - this is the culprit lesion    - severe disease in distal LCx/OM3. This area is too small for revascularization    - diffuse 80% proximal to mid RCA 2. Mildly elevated LVEDP 3. Successful PCI with stenting of the mid LAD with DES. Persistent total occlusion of the the distal LAD  Plan: DAPT for one year. IV Aggrastat for 18 hours. BP control with IV Ntg. Assess LV function with Echo. Would consider PCI of the RCA prior to DC home.     2D Echo Pending  Assessment & Plan    Principal Problem:   Acute ST elevation myocardial infarction (STEMI) involving left anterior descending (LAD) coronary artery (HCC) Active Problems:   Type 2 diabetes mellitus with complication, with long-term current use of insulin (HCC)   Essential hypertension   Hyperlipidemia with target low density lipoprotein (LDL) cholesterol less than 70 mg/dL    Chronic anemia   Coronary artery disease involving native coronary artery of native heart with unstable angina pectoris (HCC)   Persistent cough   Nausea   Anterior STEMI with multivessel CAD noted on cath: Status post PCI to the LAD.  Plan will be to consider staged PCI to the RCA either prior to discharge versus as an outpatient. -->  Will cycle troponin levels.  EKG still shows signs of evolving changes.  Echocardiogram pending  On aspirin plus Brilinta (DAPT times 1 year)  On moderate dose carvedilol along  with lisinopril --will titrate up beta-blocker.  Has successfully weaned off nitroglycerin.  LVEDP was mildly elevated per calf, auto diuresed roughly 1 L.  HTN: On carvedilol plus lisinopril along with as needed hydralazine given this morning. -If pressure continues to be elevated, would increase carvedilol dose  HLD: High-dose statin-atorvastatin 80 mg  Insulin dependent diabetes (A1c 8.6): On standing NovoLog plus Levemir with sliding scale.  Glucose this morning 234 will cover sliding scale.  Persistent cough: Will check chest x-ray to exclude infectious etiology given elevated white blood cell count of 17  Nausea: Currently getting Phenergan.  I am not sure what this is related to, but it seems to have predated her anginal pain.  Hypokalemia: Replaced with oral potassium  Plan for now will be to monitor in the ICU/CCU overnight and expect transfer out tomorrow if stable.  Will titrate up beta-blocker dose and give IV Lopressor as a as needed as well. If she feels better tomorrow we will transfer to telemetry.  Pending evaluation of her symptoms and echocardiogram results, would tentatively planned staged PCI of RCA on Monday.      For questions or updates, please contact [place] Please consult [email or web address] for contact info under Cardiology/STEMI.      Signed, Glenetta Hew, MD  03/11/2017, 11:26 AM

## 2017-03-11 NOTE — Plan of Care (Signed)
  Progressing Education: Knowledge of General Education information will improve 03/11/2017 0121 - Progressing by Arletta Bale, RN Health Behavior/Discharge Planning: Ability to manage health-related needs will improve 03/11/2017 0121 - Progressing by Arletta Bale, RN Clinical Measurements: Ability to maintain clinical measurements within normal limits will improve 03/11/2017 0121 - Progressing by Arletta Bale, RN Will remain free from infection 03/11/2017 0121 - Progressing by Arletta Bale, RN Diagnostic test results will improve 03/11/2017 0121 - Progressing by Arletta Bale, RN Respiratory complications will improve 03/11/2017 0121 - Progressing by Arletta Bale, RN Cardiovascular complication will be avoided 03/11/2017 0121 - Progressing by Arletta Bale, RN Activity: Risk for activity intolerance will decrease 03/11/2017 0121 - Progressing by Arletta Bale, RN Nutrition: Adequate nutrition will be maintained 03/11/2017 0121 - Progressing by Arletta Bale, RN Coping: Level of anxiety will decrease 03/11/2017 0121 - Progressing by Arletta Bale, RN Elimination: Will not experience complications related to bowel motility 03/11/2017 0121 - Progressing by Arletta Bale, RN Will not experience complications related to urinary retention 03/11/2017 0121 - Progressing by Arletta Bale, RN Pain Managment: General experience of comfort will improve 03/11/2017 0121 - Progressing by Arletta Bale, RN Safety: Ability to remain free from injury will improve 03/11/2017 0121 - Progressing by Arletta Bale, RN Skin Integrity: Risk for impaired skin integrity will decrease 03/11/2017 0121 - Progressing by Arletta Bale, RN Education: Understanding of CV disease, CV risk reduction, and recovery process will improve 03/11/2017 0121 - Progressing by Arletta Bale, RN Activity: Ability to return to baseline activity level will  improve 03/11/2017 0121 - Progressing by Arletta Bale, RN Cardiovascular: Ability to achieve and maintain adequate cardiovascular perfusion will improve 03/11/2017 0121 - Progressing by Arletta Bale, RN Health Behavior/Discharge Planning: Ability to safely manage health-related needs after discharge will improve 03/11/2017 0121 - Progressing by Arletta Bale, RN Education: Understanding of cardiac disease, CV risk reduction, and recovery process will improve 03/11/2017 0121 - Progressing by Arletta Bale, RN Understanding of medication regimen will improve 03/11/2017 0121 - Progressing by Arletta Bale, RN Activity: Ability to tolerate increased activity will improve 03/11/2017 0121 - Progressing by Arletta Bale, RN Cardiac: Ability to achieve and maintain adequate cardiopulmonary perfusion will improve 03/11/2017 0121 - Progressing by Arletta Bale, RN Health Behavior/Discharge Planning: Ability to safely manage health-related needs after discharge will improve 03/11/2017 0121 - Progressing by Arletta Bale, RN

## 2017-03-11 NOTE — Plan of Care (Signed)
Will have a repeat stent on Monday. Resting weekend, no further chest pain, but nausea, coughing. Increase ambulation this afternoon.

## 2017-03-11 NOTE — Progress Notes (Signed)
EKG CRITICAL VALUE     12 lead EKG performed.  Critical value noted. Norberta Keens, RN notified.   Erubiel Manasco H, CCT 03/11/2017 6:54 AM

## 2017-03-11 NOTE — Progress Notes (Signed)
Pt is currently hypertensive, SBP> 170 since 2300 12/27 & >160 post cath 12/27/ hr 60s-70s, restarted nitro gtt at 2208 12/27 have titrated up to 60 mcg since & BP remains elevated. Will not titrate any further since it has proven to be ineffective; Pt received afternoon dose of 12.5mg  carvedilol at 1649 and pm dose of 20 mg Enalapril at 2136. MD paged @ [phone] awaiting call back for further orders. Will continue to monitor.

## 2017-03-11 NOTE — Progress Notes (Signed)
Cards in house fellow [place] text paged regarding pt's productive cough, fine crackles in lungs, dyspnea. Given pt's HF may need some Lasix. Awaiting orders/callback.

## 2017-03-11 NOTE — Progress Notes (Signed)
  Echocardiogram 2D Echocardiogram has been performed.  Jannett Celestine 03/11/2017, 2:14 PM

## 2017-03-11 NOTE — Progress Notes (Signed)
Responded  And  Discussed  Pt  Symptoms of  Dyspnea,  Crackles. n exam , reviewed  Respiratory notes,  Ordered  Lasix 20 mg Reviewed  Labs, WBC 17.0,  AM cards team considering further  eval , ?hospitalist consult?underlying infection.

## 2017-03-12 LAB — GLUCOSE, CAPILLARY
GLUCOSE-CAPILLARY: 102 mg/dL — AB (ref 65–99)
Glucose-Capillary: 130 mg/dL — ABNORMAL HIGH (ref 65–99)
Glucose-Capillary: 126 mg/dL — ABNORMAL HIGH (ref 65–99)
Glucose-Capillary: 125 mg/dL — ABNORMAL HIGH (ref 65–99)
Glucose-Capillary: 60 mg/dL — ABNORMAL LOW (ref 65–99)
Glucose-Capillary: 83 mg/dL (ref 65–99)

## 2017-03-12 LAB — BASIC METABOLIC PANEL
Anion gap: 13 (ref 5–15)
BUN: 22 mg/dL — AB (ref 6–20)
CO2: 21 mmol/L — ABNORMAL LOW (ref 22–32)
CREATININE: 1.57 mg/dL — AB (ref 0.44–1.00)
Calcium: 9 mg/dL (ref 8.9–10.3)
Chloride: 99 mmol/L — ABNORMAL LOW (ref 101–111)
GFR calc Af Amer: 41 mL/min — ABNORMAL LOW (ref >60)
GFR, EST NON AFRICAN AMERICAN: 36 mL/min — AB (ref >60)
GLUCOSE: 139 mg/dL — AB (ref 65–99)
POTASSIUM: 3.3 mmol/L — AB (ref 3.5–5.1)
SODIUM: 133 mmol/L — AB (ref 135–145)

## 2017-03-12 LAB — TROPONIN I: TROPONIN I: 16.46 ng/mL — AB (ref <0.03)

## 2017-03-12 MED ORDER — FUROSEMIDE 10 MG/ML IJ SOLN
20.0000 mg | Freq: Once | INTRAMUSCULAR | Status: AC
  Administered 2017-03-12: 20 mg via INTRAVENOUS
  Filled 2017-03-12: qty 2

## 2017-03-12 MED ORDER — POTASSIUM CHLORIDE CRYS ER 20 MEQ PO TBCR
40.0000 meq | EXTENDED_RELEASE_TABLET | Freq: Once | ORAL | Status: AC
  Administered 2017-03-12: 40 meq via ORAL
  Filled 2017-03-12: qty 2

## 2017-03-12 MED ORDER — WHITE PETROLATUM EX OINT
TOPICAL_OINTMENT | CUTANEOUS | Status: AC
  Administered 2017-03-12: 12:00:00
  Filled 2017-03-12: qty 28.35

## 2017-03-12 NOTE — Progress Notes (Signed)
Paged Dr. Teena Dunk for potassium replacement for K 3.3. Received order for 40 mEq.

## 2017-03-12 NOTE — Progress Notes (Signed)
Continues with intermittent nausea, ate a small amount of breakfast. C/o being tired and washed out. asssited up to [facility], stead on feet. Dyspnea on exertion, o2 sats > 95%, placed on o2 for comfort at 2L.

## 2017-03-12 NOTE — Progress Notes (Signed)
Hypoglycemic Event  CBG: 60  Treatment: 15 GM carbohydrate snack  Symptoms: Shaky  Follow-up CBG: Time:2043 CBG Result:83  Possible Reasons for Event: Inadequate meal intake  Comments/MD notified: n/a    Alonna Buckler

## 2017-03-12 NOTE — Plan of Care (Signed)
Continues with nausea and activity intolerance due to extreme tiredness. Nutritionally only eating maybe 10% of meals even with phenergan given routinely to curb nausea.  BP less labile and HR in the 70's currently. Will continue to discuss medication maintenance and sugar control. Plan to do another stent on Monday.

## 2017-03-12 NOTE — Progress Notes (Signed)
[place] note re nausea and weakness. We will continue to follow as pt sleeping now. Graylon Good RN BSN 03/12/2017 2:18 PM

## 2017-03-12 NOTE — Progress Notes (Signed)
DAILY PROGRESS NOTE   Patient Name: Stephanie Manning Date of Encounter: 03/12/2017  Chief Complaint   Short of breath overnight, nauseated today, cough  Patient Profile   56 y.o. female with no prior history of CAD who was transferred from [facility] rocking him for an anterior STEMI.  She does have history of diabetes mellitus type 2 on insulin, hypertension and hyperlipidemia. Her STEMI anginal pain consult was consistent with acute severe chest pain radiating to the ear and jaw associated with nausea and vomiting.  Initial EKG was relatively unremarkable, however troponin was slightly elevated and second EKG was more consistent with ST elevations.  She was then brought to the proximal, Cath Lab.  Cath showed 100% proximal-mid LAD treated with DES stent.  She also has distal LAD occlusion at the apex along with 80% RCA as well as 90% distal circumflex disease.  Subjective   Breathing is better today, but has cough. Mild blood tinged sputum. Given lasix overnight - was hypokalemic, repleted. Diuresed 2.3L negative. BP elevated this am. Weight up? 3 lbs. Echo yesterday shows LVEF 30-35% with LAD territory WMA and also with inferoseptal hypokinesis and grade 2DD and elevated LV filling pressure - CXR yesterday also c/w CHF, although LVEDP was not significantly elevated at cath.  Objective   Vitals:   03/12/17 0602 03/12/17 0700 03/12/17 0745 03/12/17 0817  BP: 135/74   (!) 186/94  Pulse:  86    Resp:  (!) 26    Temp:   98.8 F (37.1 C)   TempSrc:   Oral   SpO2:  97%    Weight:      Height:        Intake/Output Summary (Last 24 hours) at 03/12/2017 0931 Last data filed at 03/12/2017 0700 Gross per 24 hour  Intake 223 ml  Output 2400 ml  Net -2177 ml   Filed Weights   03/10/17 0918 03/11/17 0530  Weight: 235 lb (106.6 kg) 238 lb 6.4 oz (108.1 kg)    Physical Exam   General appearance: alert, mild distress and moderately obese Neck: JVD - 5 cm above sternal notch, no  carotid bruit and thyroid not enlarged, symmetric, no tenderness/mass/nodules Lungs: diminished breath sounds bilaterally and rales RLL Heart: regular rate and rhythm Abdomen: soft, non-tender; bowel sounds normal; no masses,  no organomegaly Extremities: extremities normal, atraumatic, no cyanosis or edema Pulses: 2+ and symmetric Skin: Skin color, texture, turgor normal. No rashes or lesions Neurologic: Mental status: Alert, oriented, thought content appropriate Psych: Appears uncomfortable\  Inpatient Medications    Scheduled Meds: . aspirin  81 mg Oral Daily  . atorvastatin  80 mg Oral q1800  . carvedilol  18.75 mg Oral BID WC  . enalapril  20 mg Oral BID  . enoxaparin (LOVENOX) injection  40 mg Subcutaneous Q24H  . insulin aspart  0-15 Units Subcutaneous TID WC  . insulin aspart  10 Units Subcutaneous TID WC  . insulin detemir  50 Units Subcutaneous BID  . pantoprazole  40 mg Oral Daily  . sodium chloride flush  3 mL Intravenous Q12H  . ticagrelor  90 mg Oral BID  . Vitamin D (Ergocalciferol)  50,000 Units Oral Q7 days    Continuous Infusions: . sodium chloride      PRN Meds: sodium chloride, acetaminophen, hydrALAZINE, metoprolol tartrate, promethazine, sodium chloride flush   Labs   Results for orders placed or performed during the hospital encounter of 03/10/17 (from the past 48 hour(s))  Glucose, capillary  Status: Abnormal   Collection Time: 03/10/17 10:26 AM  Result Value Ref Range   Glucose-Capillary 346 (H) 65 - 99 mg/dL   Comment 1 Capillary Specimen   MRSA PCR Screening     Status: None   Collection Time: 03/10/17 10:35 AM  Result Value Ref Range   MRSA by PCR NEGATIVE NEGATIVE    Comment:        The GeneXpert MRSA Assay (FDA approved for NASAL specimens only), is one component of a comprehensive MRSA colonization surveillance program. It is not intended to diagnose MRSA infection nor to guide or monitor treatment for MRSA infections.   CBC      Status: Abnormal   Collection Time: 03/10/17 12:27 PM  Result Value Ref Range   WBC 15.6 (H) 4.0 - 10.5 K/uL   RBC 4.11 3.87 - 5.11 MIL/uL   Hemoglobin 10.8 (L) 12.0 - 15.0 g/dL   HCT 34.3 (L) 36.0 - 46.0 %   MCV 83.5 78.0 - 100.0 fL   MCH 26.3 26.0 - 34.0 pg   MCHC 31.5 30.0 - 36.0 g/dL   RDW 14.1 11.5 - 15.5 %   Platelets 244 150 - 400 K/uL  Creatinine, serum     Status: Abnormal   Collection Time: 03/10/17 12:27 PM  Result Value Ref Range   Creatinine, Ser 1.35 (H) 0.44 - 1.00 mg/dL   GFR calc non Af Amer 43 (L) >60 mL/min   GFR calc Af Amer 49 (L) >60 mL/min    Comment: (NOTE) The eGFR has been calculated using the CKD EPI equation. This calculation has not been validated in all clinical situations. eGFR's persistently <60 mL/min signify possible Chronic Kidney Disease.   Lipid panel     Status: Abnormal   Collection Time: 03/10/17 12:27 PM  Result Value Ref Range   Cholesterol 204 (H) 0 - 200 mg/dL   Triglycerides 41 <150 mg/dL   HDL 47 >40 mg/dL   Total CHOL/HDL Ratio 4.3 RATIO   VLDL 8 0 - 40 mg/dL   LDL Cholesterol 149 (H) 0 - 99 mg/dL    Comment:        Total Cholesterol/HDL:CHD Risk Coronary Heart Disease Risk Table                     Men   Women  1/2 Average Risk   3.4   3.3  Average Risk       5.0   4.4  2 X Average Risk   9.6   7.1  3 X Average Risk  23.4   11.0        Use the calculated Patient Ratio above and the CHD Risk Table to determine the patient's CHD Risk.        ATP III CLASSIFICATION (LDL):  <100     mg/dL   Optimal  100-129  mg/dL   Near or Above                    Optimal  130-159  mg/dL   Borderline  160-189  mg/dL   High  >190     mg/dL   Very High   Hemoglobin A1c     Status: Abnormal   Collection Time: 03/10/17 12:27 PM  Result Value Ref Range   Hgb A1c MFr Bld 8.6 (H) 4.8 - 5.6 %    Comment: (NOTE) Pre diabetes:          5.7%-6.4% Diabetes:              >  6.4% Glycemic control for   <7.0% adults with diabetes    Mean  Plasma Glucose 200.12 mg/dL  Glucose, capillary     Status: Abnormal   Collection Time: 03/10/17 12:33 PM  Result Value Ref Range   Glucose-Capillary 348 (H) 65 - 99 mg/dL   Comment 1 Capillary Specimen   CBC     Status: Abnormal   Collection Time: 03/10/17  4:32 PM  Result Value Ref Range   WBC 15.6 (H) 4.0 - 10.5 K/uL   RBC 4.20 3.87 - 5.11 MIL/uL   Hemoglobin 11.1 (L) 12.0 - 15.0 g/dL   HCT 34.7 (L) 36.0 - 46.0 %   MCV 82.6 78.0 - 100.0 fL   MCH 26.4 26.0 - 34.0 pg   MCHC 32.0 30.0 - 36.0 g/dL   RDW 14.2 11.5 - 15.5 %   Platelets 229 150 - 400 K/uL  Glucose, capillary     Status: Abnormal   Collection Time: 03/10/17  4:46 PM  Result Value Ref Range   Glucose-Capillary 388 (H) 65 - 99 mg/dL   Comment 1 Notify RN   Basic metabolic panel     Status: Abnormal   Collection Time: 03/10/17  5:31 PM  Result Value Ref Range   Sodium 133 (L) 135 - 145 mmol/L   Potassium 4.7 3.5 - 5.1 mmol/L   Chloride 101 101 - 111 mmol/L   CO2 19 (L) 22 - 32 mmol/L   Glucose, Bld 326 (H) 65 - 99 mg/dL   BUN 19 6 - 20 mg/dL   Creatinine, Ser 1.50 (H) 0.44 - 1.00 mg/dL   Calcium 8.7 (L) 8.9 - 10.3 mg/dL   GFR calc non Af Amer 38 (L) >60 mL/min   GFR calc Af Amer 44 (L) >60 mL/min    Comment: (NOTE) The eGFR has been calculated using the CKD EPI equation. This calculation has not been validated in all clinical situations. eGFR's persistently <60 mL/min signify possible Chronic Kidney Disease.    Anion gap 13 5 - 15  Magnesium     Status: None   Collection Time: 03/10/17  5:31 PM  Result Value Ref Range   Magnesium 1.7 1.7 - 2.4 mg/dL  Glucose, capillary     Status: Abnormal   Collection Time: 03/10/17  9:35 PM  Result Value Ref Range   Glucose-Capillary 130 (H) 65 - 99 mg/dL   Comment 1 Notify RN   Basic metabolic panel     Status: Abnormal   Collection Time: 03/11/17  3:54 AM  Result Value Ref Range   Sodium 135 135 - 145 mmol/L   Potassium 3.4 (L) 3.5 - 5.1 mmol/L   Chloride 100 (L)  101 - 111 mmol/L   CO2 22 22 - 32 mmol/L   Glucose, Bld 229 (H) 65 - 99 mg/dL   BUN 20 6 - 20 mg/dL   Creatinine, Ser 1.40 (H) 0.44 - 1.00 mg/dL   Calcium 9.0 8.9 - 10.3 mg/dL   GFR calc non Af Amer 41 (L) >60 mL/min   GFR calc Af Amer 47 (L) >60 mL/min    Comment: (NOTE) The eGFR has been calculated using the CKD EPI equation. This calculation has not been validated in all clinical situations. eGFR's persistently <60 mL/min signify possible Chronic Kidney Disease.    Anion gap 13 5 - 15  CBC     Status: Abnormal   Collection Time: 03/11/17  3:54 AM  Result Value Ref Range   WBC 17.0 (H) 4.0 -  10.5 K/uL   RBC 4.05 3.87 - 5.11 MIL/uL   Hemoglobin 10.7 (L) 12.0 - 15.0 g/dL   HCT 33.5 (L) 36.0 - 46.0 %   MCV 82.7 78.0 - 100.0 fL   MCH 26.4 26.0 - 34.0 pg   MCHC 31.9 30.0 - 36.0 g/dL   RDW 14.5 11.5 - 15.5 %   Platelets 242 150 - 400 K/uL  Glucose, capillary     Status: Abnormal   Collection Time: 03/11/17  8:10 AM  Result Value Ref Range   Glucose-Capillary 234 (H) 65 - 99 mg/dL   Comment 1 Notify RN   Troponin I     Status: Abnormal   Collection Time: 03/11/17 11:31 AM  Result Value Ref Range   Troponin I 17.49 (HH) <0.03 ng/mL    Comment: CRITICAL RESULT CALLED TO, READ BACK BY AND VERIFIED WITH: S CORREA,RN 1259 03/11/17 D BRADLEY   Glucose, capillary     Status: Abnormal   Collection Time: 03/11/17 11:56 AM  Result Value Ref Range   Glucose-Capillary 160 (H) 65 - 99 mg/dL   Comment 1 Notify RN   Glucose, capillary     Status: Abnormal   Collection Time: 03/11/17  4:47 PM  Result Value Ref Range   Glucose-Capillary 134 (H) 65 - 99 mg/dL   Comment 1 Notify RN   Troponin I     Status: Abnormal   Collection Time: 03/11/17  5:29 PM  Result Value Ref Range   Troponin I 17.88 (HH) <0.03 ng/mL    Comment: CRITICAL VALUE NOTED.  VALUE IS CONSISTENT WITH PREVIOUSLY REPORTED AND CALLED VALUE.  Glucose, capillary     Status: None   Collection Time: 03/11/17  9:07 PM    Result Value Ref Range   Glucose-Capillary 74 65 - 99 mg/dL   Comment 1 Notify RN   Troponin I     Status: Abnormal   Collection Time: 03/12/17 12:25 AM  Result Value Ref Range   Troponin I 16.46 (HH) <0.03 ng/mL    Comment: CRITICAL VALUE NOTED.  VALUE IS CONSISTENT WITH PREVIOUSLY REPORTED AND CALLED VALUE.  Basic metabolic panel     Status: Abnormal   Collection Time: 03/12/17 12:25 AM  Result Value Ref Range   Sodium 133 (L) 135 - 145 mmol/L   Potassium 3.3 (L) 3.5 - 5.1 mmol/L   Chloride 99 (L) 101 - 111 mmol/L   CO2 21 (L) 22 - 32 mmol/L   Glucose, Bld 139 (H) 65 - 99 mg/dL   BUN 22 (H) 6 - 20 mg/dL   Creatinine, Ser 1.57 (H) 0.44 - 1.00 mg/dL   Calcium 9.0 8.9 - 10.3 mg/dL   GFR calc non Af Amer 36 (L) >60 mL/min   GFR calc Af Amer 41 (L) >60 mL/min    Comment: (NOTE) The eGFR has been calculated using the CKD EPI equation. This calculation has not been validated in all clinical situations. eGFR's persistently <60 mL/min signify possible Chronic Kidney Disease.    Anion gap 13 5 - 15  Glucose, capillary     Status: Abnormal   Collection Time: 03/12/17  2:51 AM  Result Value Ref Range   Glucose-Capillary 130 (H) 65 - 99 mg/dL   Comment 1 Capillary Specimen   Glucose, capillary     Status: Abnormal   Collection Time: 03/12/17  7:44 AM  Result Value Ref Range   Glucose-Capillary 126 (H) 65 - 99 mg/dL   Comment 1 Notify RN  ECG   N/A  Telemetry   NSR - Personally Reviewed  Radiology    Dg Chest Port 1 View  Result Date: 03/11/2017 CLINICAL DATA:  Myocardial infarction. Cardiac stents placed yesterday. EXAM: PORTABLE CHEST 1 VIEW COMPARISON:  None. FINDINGS: Heart size normal. Mild interstitial edema is present bilaterally. There is no significant airspace consolidation. No definite effusions are present. IMPRESSION: 1. Mild interstitial edema without focal airspace consolidation or effusions. This may represent early congestive heart failure.  Electronically Signed   By: San Morelle M.D.   On: 03/11/2017 13:22    Cardiac Studies   LV EF: 30% -   35%  ------------------------------------------------------------------- History:   PMH:  Anterior STEMI. CAD Native Vessel 414.01  Coronary artery disease.  Risk factors:  Hypertension. Diabetes mellitus. Dyslipidemia.  ------------------------------------------------------------------- Study Conclusions  - Left ventricle: The cavity size was normal. There was moderate   concentric hypertrophy. Systolic function was moderately to   severely reduced. The estimated ejection fraction was in the   range of 30% to 35%. Features are consistent with a pseudonormal   left ventricular filling pattern, with concomitant abnormal   relaxation and increased filling pressure (grade 2 diastolic   dysfunction). Doppler parameters are consistent with elevated   ventricular end-diastolic filling pressure. - Aortic valve: Trileaflet; normal thickness leaflets. There was no   regurgitation. - Mitral valve: There was mild regurgitation. - Right ventricle: The cavity size was normal. Wall thickness was   normal. Systolic function was normal. - Tricuspid valve: There was mild regurgitation. - Pulmonary arteries: The main pulmonary artery was normal-sized.   Systolic pressure was within the normal range. - Inferior vena cava: The vessel was normal in size. - Pericardium, extracardiac: There was no pericardial effusion.  Impressions:  - There is akinesis of the mid anteroseptal, anterior and apical   anterior, septal and inferior walls. No thrombus is seen on the   Definity echocontrast images.  Assessment   1. Principal Problem: 2.   Acute ST elevation myocardial infarction (STEMI) involving left anterior descending (LAD) coronary artery ([place]) 3. Active Problems: 4.   Type 2 diabetes mellitus with complication, with long-term current use of insulin ([place]) 5.   Essential  hypertension 6.   Hyperlipidemia with target low density lipoprotein (LDL) cholesterol less than 70 mg/dL 7.   Chronic anemia 8.   Coronary artery disease involving native coronary artery of native heart with unstable angina pectoris (HCC) 9.   Persistent cough 10.   Nausea 11.   Plan   1. Nauseated today, but no chest pain. Hypokalemic this am - repleted. Plan for repeat cath Monday with PCI to RCA. LVEF 30-35% - diuresed 2L overnight. Will give additional Lasix today. Nausea may be related to motility disorder since she has IDDM - consider reglan. She is on phenergan and zofran.   Time Spent Directly with Patient:  I have spent a total of 25 minutes with the patient reviewing hospital notes, telemetry, EKGs, labs and examining the patient as well as establishing an assessment and plan that was discussed personally with the patient. > 50% of time was spent in direct patient care.  Length of Stay:  LOS: 2 days   Pixie Casino, MD, [facility], [place] Director of the [facility] Lipid [facility] &  Card[facility] Reduction [facility] Attending Cardiologist  Direct Dial: [phone]  Fax: [phone]  Website:  [email or web address] 03/12/2017, 9:31 AM

## 2017-03-13 DIAGNOSIS — I5021 Acute systolic (congestive) heart failure: Secondary | ICD-10-CM

## 2017-03-13 LAB — BASIC METABOLIC PANEL
Anion gap: 11 (ref 5–15)
BUN: 27 mg/dL — ABNORMAL HIGH (ref 6–20)
CO2: 21 mmol/L — ABNORMAL LOW (ref 22–32)
Calcium: 8.3 mg/dL — ABNORMAL LOW (ref 8.9–10.3)
Chloride: 98 mmol/L — ABNORMAL LOW (ref 101–111)
Creatinine, Ser: 1.73 mg/dL — ABNORMAL HIGH (ref 0.44–1.00)
GFR calc Af Amer: 37 mL/min — ABNORMAL LOW (ref >60)
GFR calc non Af Amer: 32 mL/min — ABNORMAL LOW (ref >60)
Glucose, Bld: 70 mg/dL (ref 65–99)
Potassium: 2.9 mmol/L — ABNORMAL LOW (ref 3.5–5.1)
Sodium: 130 mmol/L — ABNORMAL LOW (ref 135–145)

## 2017-03-13 LAB — CBC
HCT: 32.2 % — ABNORMAL LOW (ref 36.0–46.0)
Hemoglobin: 10.3 g/dL — ABNORMAL LOW (ref 12.0–15.0)
MCH: 26.4 pg (ref 26.0–34.0)
MCHC: 32 g/dL (ref 30.0–36.0)
MCV: 82.6 fL (ref 78.0–100.0)
Platelets: 225 10*3/uL (ref 150–400)
RBC: 3.9 MIL/uL (ref 3.87–5.11)
RDW: 15 % (ref 11.5–15.5)
WBC: 15 10*3/uL — ABNORMAL HIGH (ref 4.0–10.5)

## 2017-03-13 LAB — GLUCOSE, CAPILLARY
GLUCOSE-CAPILLARY: 141 mg/dL — AB (ref 65–99)
GLUCOSE-CAPILLARY: 72 mg/dL (ref 65–99)
GLUCOSE-CAPILLARY: 56 mg/dL — AB (ref 65–99)
Glucose-Capillary: 104 mg/dL — ABNORMAL HIGH (ref 65–99)
Glucose-Capillary: 61 mg/dL — ABNORMAL LOW (ref 65–99)
Glucose-Capillary: 78 mg/dL (ref 65–99)

## 2017-03-13 LAB — MAGNESIUM: MAGNESIUM: 1.9 mg/dL (ref 1.7–2.4)

## 2017-03-13 MED ORDER — SODIUM CHLORIDE 0.9 % IV SOLN: 250.0000 mL | INTRAVENOUS | Status: DC | PRN

## 2017-03-13 MED ORDER — INSULIN DETEMIR 100 UNIT/ML ~~LOC~~ SOLN
40.0000 [IU] | Freq: Every day | SUBCUTANEOUS | Status: DC
  Administered 2017-03-14 – 2017-03-15 (×2): 40 [IU] via SUBCUTANEOUS
  Filled 2017-03-13 (×5): qty 0.4

## 2017-03-13 MED ORDER — INSULIN DETEMIR 100 UNIT/ML ~~LOC~~ SOLN: 50.0000 [IU] | Freq: Every morning | SUBCUTANEOUS | Status: DC

## 2017-03-13 MED ORDER — SODIUM CHLORIDE 0.9% FLUSH: 3.0000 mL | INTRAVENOUS | Status: DC | PRN

## 2017-03-13 MED ORDER — INSULIN DETEMIR 100 UNIT/ML ~~LOC~~ SOLN
50.0000 [IU] | Freq: Every day | SUBCUTANEOUS | Status: DC
  Filled 2017-03-13: qty 0.5

## 2017-03-13 MED ORDER — SODIUM CHLORIDE 0.9 % IV SOLN
INTRAVENOUS | Status: DC
  Administered 2017-03-14: 09:00:00 via INTRAVENOUS

## 2017-03-13 MED ORDER — SODIUM CHLORIDE 0.9% FLUSH
3.0000 mL | Freq: Two times a day (BID) | INTRAVENOUS | Status: DC
  Administered 2017-03-13 – 2017-03-14 (×2): 3 mL via INTRAVENOUS

## 2017-03-13 MED ORDER — POTASSIUM CHLORIDE 10 MEQ/100ML IV SOLN
10.0000 meq | INTRAVENOUS | Status: AC
  Administered 2017-03-13 (×4): 10 meq via INTRAVENOUS
  Filled 2017-03-13 (×4): qty 100

## 2017-03-13 MED ORDER — INSULIN GLARGINE 100 UNIT/ML ~~LOC~~ SOLN: 40.0000 [IU] | Freq: Every day | SUBCUTANEOUS | Status: DC

## 2017-03-13 MED ORDER — POTASSIUM CHLORIDE CRYS ER 20 MEQ PO TBCR
40.0000 meq | EXTENDED_RELEASE_TABLET | Freq: Once | ORAL | Status: AC
  Administered 2017-03-13: 40 meq via ORAL
  Filled 2017-03-13: qty 2

## 2017-03-13 NOTE — Plan of Care (Signed)
Progressing, no nausea eating, blood sugars under control. Will go to cath lab tomorrow to have another stent.

## 2017-03-13 NOTE — Progress Notes (Signed)
Attempted to call education channel to view cath education. No answer will try again later. Patient had a good shift, no nausea or pain, ambulating to Bathroom in room. Alert talkative smiling.

## 2017-03-13 NOTE — Progress Notes (Signed)
DAILY PROGRESS NOTE   Patient Name: Stephanie Manning Date of Encounter: 03/13/2017  Chief Complaint   Breathing better today - less nauseated, cough improved  Patient Profile   55 y.o. female with no prior history of CAD who was transferred from [facility] rocking him for an anterior STEMI.  She does have history of diabetes mellitus type 2 on insulin, hypertension and hyperlipidemia. Her STEMI anginal pain consult was consistent with acute severe chest pain radiating to the ear and jaw associated with nausea and vomiting.  Initial EKG was relatively unremarkable, however troponin was slightly elevated and second EKG was more consistent with ST elevations.  She was then brought to the proximal, Cath Lab.  Cath showed 100% proximal-mid LAD treated with DES stent.  She also has distal LAD occlusion at the apex along with 80% RCA as well as 90% distal circumflex disease.  Subjective   Diuresed another 1.3L negative overnight- potassium low this am, repleted. Will add-on magnesium to today's labs and replete if necessary. Noted to have am hypoglycemia - will address.  Objective   Vitals:   03/13/17 0400 03/13/17 0600 03/13/17 0800 03/13/17 0803  BP: (!) 93/57 (!) 164/78 (!) 146/71   Pulse: 63 67 70   Resp: '12 13 15   ' Temp: 99 F (37.2 C)   98 F (36.7 C)  TempSrc: Oral   Oral  SpO2: 98% 100% 99%   Weight:      Height:        Intake/Output Summary (Last 24 hours) at 03/13/2017 6269 Last data filed at 03/13/2017 0654 Gross per 24 hour  Intake 440 ml  Output 1800 ml  Net -1360 ml   Filed Weights   03/10/17 0918 03/11/17 0530  Weight: 235 lb (106.6 kg) 238 lb 6.4 oz (108.1 kg)    Physical Exam   General appearance: alert, mild distress and moderately obese Neck: JVD - 1 cm above sternal notch, no carotid bruit and thyroid not enlarged, symmetric, no tenderness/mass/nodules Lungs: clear to auscultation bilaterally Heart: regular rate and rhythm Abdomen: soft, non-tender;  bowel sounds normal; no masses,  no organomegaly Extremities: extremities normal, atraumatic, no cyanosis or edema Pulses: 2+ and symmetric Skin: Skin color, texture, turgor normal. No rashes or lesions Neurologic: Grossly normal Psych: more comfortable today  Inpatient Medications    Scheduled Meds: . aspirin  81 mg Oral Daily  . atorvastatin  80 mg Oral q1800  . carvedilol  18.75 mg Oral BID WC  . enalapril  20 mg Oral BID  . enoxaparin (LOVENOX) injection  40 mg Subcutaneous Q24H  . insulin aspart  0-15 Units Subcutaneous TID WC  . insulin aspart  10 Units Subcutaneous TID WC  . insulin detemir  50 Units Subcutaneous BID  . pantoprazole  40 mg Oral Daily  . sodium chloride flush  3 mL Intravenous Q12H  . ticagrelor  90 mg Oral BID  . Vitamin D (Ergocalciferol)  50,000 Units Oral Q7 days    Continuous Infusions: . sodium chloride    . potassium chloride 10 mEq (03/13/17 0913)    PRN Meds: sodium chloride, acetaminophen, hydrALAZINE, metoprolol tartrate, promethazine, sodium chloride flush   Labs   Results for orders placed or performed during the hospital encounter of 03/10/17 (from the past 48 hour(s))  Troponin I     Status: Abnormal   Collection Time: 03/11/17 11:31 AM  Result Value Ref Range   Troponin I 17.49 (HH) <0.03 ng/mL    Comment: CRITICAL  RESULT CALLED TO, READ BACK BY AND VERIFIED WITH: S CORREA,RN 1259 03/11/17 D BRADLEY   Glucose, capillary     Status: Abnormal   Collection Time: 03/11/17 11:56 AM  Result Value Ref Range   Glucose-Capillary 160 (H) 65 - 99 mg/dL   Comment 1 Notify RN   Glucose, capillary     Status: Abnormal   Collection Time: 03/11/17  4:47 PM  Result Value Ref Range   Glucose-Capillary 134 (H) 65 - 99 mg/dL   Comment 1 Notify RN   Troponin I     Status: Abnormal   Collection Time: 03/11/17  5:29 PM  Result Value Ref Range   Troponin I 17.88 (HH) <0.03 ng/mL    Comment: CRITICAL VALUE NOTED.  VALUE IS CONSISTENT WITH  PREVIOUSLY REPORTED AND CALLED VALUE.  Glucose, capillary     Status: None   Collection Time: 03/11/17  9:07 PM  Result Value Ref Range   Glucose-Capillary 74 65 - 99 mg/dL   Comment 1 Notify RN   Troponin I     Status: Abnormal   Collection Time: 03/12/17 12:25 AM  Result Value Ref Range   Troponin I 16.46 (HH) <0.03 ng/mL    Comment: CRITICAL VALUE NOTED.  VALUE IS CONSISTENT WITH PREVIOUSLY REPORTED AND CALLED VALUE.  Basic metabolic panel     Status: Abnormal   Collection Time: 03/12/17 12:25 AM  Result Value Ref Range   Sodium 133 (L) 135 - 145 mmol/L   Potassium 3.3 (L) 3.5 - 5.1 mmol/L   Chloride 99 (L) 101 - 111 mmol/L   CO2 21 (L) 22 - 32 mmol/L   Glucose, Bld 139 (H) 65 - 99 mg/dL   BUN 22 (H) 6 - 20 mg/dL   Creatinine, Ser 1.57 (H) 0.44 - 1.00 mg/dL   Calcium 9.0 8.9 - 10.3 mg/dL   GFR calc non Af Amer 36 (L) >60 mL/min   GFR calc Af Amer 41 (L) >60 mL/min    Comment: (NOTE) The eGFR has been calculated using the CKD EPI equation. This calculation has not been validated in all clinical situations. eGFR's persistently <60 mL/min signify possible Chronic Kidney Disease.    Anion gap 13 5 - 15  Glucose, capillary     Status: Abnormal   Collection Time: 03/12/17  2:51 AM  Result Value Ref Range   Glucose-Capillary 130 (H) 65 - 99 mg/dL   Comment 1 Capillary Specimen   Glucose, capillary     Status: Abnormal   Collection Time: 03/12/17  7:44 AM  Result Value Ref Range   Glucose-Capillary 126 (H) 65 - 99 mg/dL   Comment 1 Notify RN   Glucose, capillary     Status: Abnormal   Collection Time: 03/12/17 11:28 AM  Result Value Ref Range   Glucose-Capillary 102 (H) 65 - 99 mg/dL   Comment 1 Notify RN   Glucose, capillary     Status: Abnormal   Collection Time: 03/12/17  4:10 PM  Result Value Ref Range   Glucose-Capillary 125 (H) 65 - 99 mg/dL   Comment 1 Notify RN   Glucose, capillary     Status: Abnormal   Collection Time: 03/12/17  8:22 PM  Result Value Ref  Range   Glucose-Capillary 60 (L) 65 - 99 mg/dL   Comment 1 Capillary Specimen    Comment 2 Notify RN   Glucose, capillary     Status: None   Collection Time: 03/12/17  8:43 PM  Result Value Ref Range  Glucose-Capillary 83 65 - 99 mg/dL   Comment 1 Capillary Specimen    Comment 2 Notify RN   Basic metabolic panel     Status: Abnormal   Collection Time: 03/13/17  2:53 AM  Result Value Ref Range   Sodium 130 (L) 135 - 145 mmol/L   Potassium 2.9 (L) 3.5 - 5.1 mmol/L   Chloride 98 (L) 101 - 111 mmol/L   CO2 21 (L) 22 - 32 mmol/L   Glucose, Bld 70 65 - 99 mg/dL   BUN 27 (H) 6 - 20 mg/dL   Creatinine, Ser 1.73 (H) 0.44 - 1.00 mg/dL   Calcium 8.3 (L) 8.9 - 10.3 mg/dL   GFR calc non Af Amer 32 (L) >60 mL/min   GFR calc Af Amer 37 (L) >60 mL/min    Comment: (NOTE) The eGFR has been calculated using the CKD EPI equation. This calculation has not been validated in all clinical situations. eGFR's persistently <60 mL/min signify possible Chronic Kidney Disease.    Anion gap 11 5 - 15  CBC     Status: Abnormal   Collection Time: 03/13/17  2:53 AM  Result Value Ref Range   WBC 15.0 (H) 4.0 - 10.5 K/uL   RBC 3.90 3.87 - 5.11 MIL/uL   Hemoglobin 10.3 (L) 12.0 - 15.0 g/dL   HCT 32.2 (L) 36.0 - 46.0 %   MCV 82.6 78.0 - 100.0 fL   MCH 26.4 26.0 - 34.0 pg   MCHC 32.0 30.0 - 36.0 g/dL   RDW 15.0 11.5 - 15.5 %   Platelets 225 150 - 400 K/uL    ECG   N/A  Telemetry   NSR - Personally Reviewed  Radiology    Dg Chest Port 1 View  Result Date: 03/11/2017 CLINICAL DATA:  Myocardial infarction. Cardiac stents placed yesterday. EXAM: PORTABLE CHEST 1 VIEW COMPARISON:  None. FINDINGS: Heart size normal. Mild interstitial edema is present bilaterally. There is no significant airspace consolidation. No definite effusions are present. IMPRESSION: 1. Mild interstitial edema without focal airspace consolidation or effusions. This may represent early congestive heart failure. Electronically  Signed   By: San Morelle M.D.   On: 03/11/2017 13:22    Cardiac Studies   LV EF: 30% -   35%  ------------------------------------------------------------------- History:   PMH:  Anterior STEMI. CAD Native Vessel 414.01  Coronary artery disease.  Risk factors:  Hypertension. Diabetes mellitus. Dyslipidemia.  ------------------------------------------------------------------- Study Conclusions  - Left ventricle: The cavity size was normal. There was moderate   concentric hypertrophy. Systolic function was moderately to   severely reduced. The estimated ejection fraction was in the   range of 30% to 35%. Features are consistent with a pseudonormal   left ventricular filling pattern, with concomitant abnormal   relaxation and increased filling pressure (grade 2 diastolic   dysfunction). Doppler parameters are consistent with elevated   ventricular end-diastolic filling pressure. - Aortic valve: Trileaflet; normal thickness leaflets. There was no   regurgitation. - Mitral valve: There was mild regurgitation. - Right ventricle: The cavity size was normal. Wall thickness was   normal. Systolic function was normal. - Tricuspid valve: There was mild regurgitation. - Pulmonary arteries: The main pulmonary artery was normal-sized.   Systolic pressure was within the normal range. - Inferior vena cava: The vessel was normal in size. - Pericardium, extracardiac: There was no pericardial effusion.  Impressions:  - There is akinesis of the mid anteroseptal, anterior and apical   anterior, septal and inferior walls.  No thrombus is seen on the   Definity echocontrast images.  Assessment   Principal Problem:   Acute ST elevation myocardial infarction (STEMI) involving left anterior descending (LAD) coronary artery (HCC) Active Problems:   Type 2 diabetes mellitus with complication, with long-term current use of insulin (HCC)   Essential hypertension   Hyperlipidemia with  target low density lipoprotein (LDL) cholesterol less than 70 mg/dL   Chronic anemia   Coronary artery disease involving native coronary artery of native heart with unstable angina pectoris (HCC)   Persistent cough   Nausea   Plan   1. Cough and breathing improved with diuresis. Nausea somewhat improved - able to eat last night. BG 70 this am - she is on BID lantus.  Leukocytosis is resolving. Creatinine up some from 1.57 -> 1.73, in the face of aggressive diuresis. Will hold on diuretics today - gentle hydration tomorrow am for planned PCI. Keep NPO p MN.  Time Spent Directly with Patient:  I have spent a total of 15 minutes with the patient reviewing hospital notes, telemetry, EKGs, labs and examining the patient as well as establishing an assessment and plan that was discussed personally with the patient. > 50% of time was spent in direct patient care.  Length of Stay:  LOS: 3 days   Pixie Casino, MD, [facility] [place] Director of the Advanced Lipid Disorders &  Cardiovascular Risk Reduction Clinic Attending Cardiologist  Direct Dial: [phone]  Fax: [phone]  Website:  [email or web address] 03/13/2017, 9:17 AM

## 2017-03-13 NOTE — Progress Notes (Signed)
CRITICAL VALUE ALERT  Critical Value:  k 2.9  Date & Time Notied:  03/13/17 4:41 AM  Provider Notified: Delfina Redwood md   Orders Received/Actions taken: 40 PO replacement, 40 IV replacement

## 2017-03-14 DIAGNOSIS — I5041 Acute combined systolic (congestive) and diastolic (congestive) heart failure: Secondary | ICD-10-CM | POA: Diagnosis present

## 2017-03-14 DIAGNOSIS — I5023 Acute on chronic systolic (congestive) heart failure: Secondary | ICD-10-CM | POA: Diagnosis present

## 2017-03-14 DIAGNOSIS — N990 Postprocedural (acute) (chronic) kidney failure: Secondary | ICD-10-CM

## 2017-03-14 DIAGNOSIS — I255 Ischemic cardiomyopathy: Secondary | ICD-10-CM

## 2017-03-14 LAB — GLUCOSE, CAPILLARY
GLUCOSE-CAPILLARY: 114 mg/dL — AB (ref 65–99)
GLUCOSE-CAPILLARY: 92 mg/dL (ref 65–99)
GLUCOSE-CAPILLARY: 212 mg/dL — AB (ref 65–99)
GLUCOSE-CAPILLARY: 167 mg/dL — AB (ref 65–99)
Glucose-Capillary: 142 mg/dL — ABNORMAL HIGH (ref 65–99)
Glucose-Capillary: 237 mg/dL — ABNORMAL HIGH (ref 65–99)

## 2017-03-14 LAB — BASIC METABOLIC PANEL
ANION GAP: 11 (ref 5–15)
BUN: 33 mg/dL — ABNORMAL HIGH (ref 6–20)
CHLORIDE: 102 mmol/L (ref 101–111)
CO2: 19 mmol/L — AB (ref 22–32)
CREATININE: 2.05 mg/dL — AB (ref 0.44–1.00)
Calcium: 8.1 mg/dL — ABNORMAL LOW (ref 8.9–10.3)
GFR calc non Af Amer: 26 mL/min — ABNORMAL LOW (ref >60)
GFR, EST AFRICAN AMERICAN: 30 mL/min — AB (ref >60)
Glucose, Bld: 117 mg/dL — ABNORMAL HIGH (ref 65–99)
Potassium: 3.6 mmol/L (ref 3.5–5.1)
SODIUM: 132 mmol/L — AB (ref 135–145)

## 2017-03-14 MED ORDER — LOPERAMIDE HCL 2 MG PO CAPS
2.0000 mg | ORAL_CAPSULE | ORAL | Status: DC | PRN
  Administered 2017-03-14: 2 mg via ORAL
  Filled 2017-03-14: qty 1

## 2017-03-14 NOTE — Plan of Care (Signed)
Patient feeling much better this afternoon. She has been up walking with cardiac rehab and also ate most of her lunch. Her energy level is increasing. She is very receptive to education about the condition.

## 2017-03-14 NOTE — Progress Notes (Signed)
   Paperwork submitted and faxed for [place]. Notified Rep of request.   Signed, Reino Bellis, NP-C 03/14/2017, 3:26 PM Pager: [phone]

## 2017-03-14 NOTE — Progress Notes (Signed)
Patient has had several rounds of diarrhea overnight and this morning. She has been given imodium per order. She is currently receiving fluids and has been able to eat a small meal. She states she feels weak but is improving. She feels the loose stool is from eating some "greens" that did not agree with her as well as some apple juice. MD is aware, no changes at this time.

## 2017-03-14 NOTE — Progress Notes (Signed)
CARDIAC REHAB PHASE I   PRE:  Rate/Rhythm: 87 SR    BP: sitting 123/71    SaO2: 97 RA  MODE:  Ambulation: 290 ft   POST:  Rate/Rhythm: 96 SR    BP: sitting 143/77     SaO2: 97 RA   Pt able to ambulate this pm. Only c/o is feeling like she can't swallow well, like something is stuck. Sts she has been having this after eating here in the hospital. She is on Protonix at home. Sts she is slightly SOB but much improved compared to at home for the past 6 months. Sts she hasn't been able to walk to her mailbox  With stopping to rest for 6 months.  To recliner. Ed completed. Voiced understanding but does seem sleepy at times. Discussed MI, stent, Brilinta, restrictions, HF daily wts, low sodium, exercise, NTG, and CRPII. Voiced understanding. Will refer to [facility]. Encouraged more walking. [place], ACSM 03/14/2017 2:59 PM

## 2017-03-14 NOTE — Progress Notes (Signed)
Per Clinical biochemist on Brilinta  # 8.  S/W KARLON[facility] @ [facility] # [id number] OPT- 2   BRILINTA 90 MG BID   COVER- YES  CO-PAY- $ 8.50  PRIOR APPROVAL- NO   PREFERRED PHARMACY : [facility]

## 2017-03-14 NOTE — Plan of Care (Signed)
  Progressing Education: Knowledge of General Education information will improve 03/14/2017 0351 - Progressing by Alonna Buckler, RN Health Behavior/Discharge Planning: Ability to manage health-related needs will improve 03/14/2017 0351 - Progressing by Alonna Buckler, RN Clinical Measurements: Ability to maintain clinical measurements within normal limits will improve 03/14/2017 0351 - Progressing by Alonna Buckler, RN Activity: Risk for activity intolerance will decrease 03/14/2017 0351 - Progressing by Alonna Buckler, RN Elimination: Will not experience complications related to bowel motility 03/14/2017 0351 - Progressing by Alonna Buckler, RN Will not experience complications related to urinary retention 03/14/2017 0351 - Progressing by Alonna Buckler, RN Pain Managment: General experience of comfort will improve 03/14/2017 0351 - Progressing by Alonna Buckler, RN Safety: Ability to remain free from injury will improve 03/14/2017 0351 - Progressing by Alonna Buckler, RN Skin Integrity: Risk for impaired skin integrity will decrease 03/14/2017 0351 - Progressing by Alonna Buckler, RN Activity: Ability to return to baseline activity level will improve 03/14/2017 0351 - Progressing by Alonna Buckler, RN Cardiovascular: Ability to achieve and maintain adequate cardiovascular perfusion will improve 03/14/2017 0351 - Progressing by Alonna Buckler, RN Health Behavior/Discharge Planning: Ability to safely manage health-related needs after discharge will improve 03/14/2017 0351 - Progressing by Alonna Buckler, RN Activity: Ability to tolerate increased activity will improve 03/14/2017 0351 - Progressing by Alonna Buckler, RN

## 2017-03-14 NOTE — Care Management Note (Addendum)
Case Management Note Marvetta Gibbons RN, BSN Unit 4E-Case Manager--2 H coverage [phone]  Patient Details  Name: Stephanie Manning MRN: [id number] Date of Birth: 14-Jan-1961  Subjective/Objective:  Pt admitted with STEMI, s/p DES - started on Brilinta                  Action/Plan:  PTA Pt lived at home, independent- referral for Brilinta needs- spoke with pt at bedside- who states she has Medicare insurance with drug coverage through [employer]. Uses [facility] in [place]. Benefits check submitted to see if insurance opened today to find out copay cost- 30 day free card provided to pt to use on discharge. CM will f/u with pt   Expected Discharge Date:  03/15/17               Expected Discharge Plan:  Home/Self Care  In-House Referral:     Discharge planning Services  CM Consult, Medication Assistance  Post Acute Care Choice:    Choice offered to:     DME Arranged:    DME Agency:     HH Arranged:    HH Agency:     Status of Service:  In process, will continue to follow  If discussed at Long Length of Stay Meetings, dates discussed:    Discharge Disposition:   Additional Comments:  Dawayne Patricia, RN 03/14/2017, 1:43 PM

## 2017-03-14 NOTE — Progress Notes (Signed)
Report called to [facility] on 6E. Patient will be going to room 29. All personal items accounted for. Patient has notified family of the room change.

## 2017-03-14 NOTE — Progress Notes (Addendum)
Progress Note  Patient Name: Stephanie Manning Date of Encounter: 03/14/2017  Primary Cardiologist: No primary care provider on file.   Patient Profile     56 y.o. female with no prior history of CAD who was transferred from [facility] rocking him for an anterior STEMI.  She does have history of diabetes mellitus type 2 on insulin, hypertension and hyperlipidemia. Her STEMI anginal pain consult was consistent with acute severe chest pain radiating to the ear and jaw associated with nausea and vomiting.  Initial EKG was relatively unremarkable, however troponin was slightly elevated and second EKG was more consistent with ST elevations.  She was then brought to the proximal, Cath Lab.  Cath showed 100% proximal-mid LAD treated with DES stent.  She also has distal LAD occlusion at the apex along with 80% RCA as well as 90% distal circumflex disease.  Subjective   No further chest pain.  No further nausea Has had some loose stools overnight, likely related to something she ate.  Feeling better this morning however.  Breathing is notably improved.  Able to lie down flat without difficulty.  Inpatient Medications    Scheduled Meds: . aspirin  81 mg Oral Daily  . atorvastatin  80 mg Oral q1800  . carvedilol  18.75 mg Oral BID WC  . enoxaparin (LOVENOX) injection  40 mg Subcutaneous Q24H  . insulin aspart  0-15 Units Subcutaneous TID WC  . insulin aspart  10 Units Subcutaneous TID WC  . insulin detemir  50 Units Subcutaneous Daily   And  . insulin detemir  40 Units Subcutaneous QHS  . pantoprazole  40 mg Oral Daily  . sodium chloride flush  3 mL Intravenous Q12H  . sodium chloride flush  3 mL Intravenous Q12H  . ticagrelor  90 mg Oral BID  . Vitamin D (Ergocalciferol)  50,000 Units Oral Q7 days   Continuous Infusions: . sodium chloride    . sodium chloride    . sodium chloride 50 mL/hr at 03/14/17 0842   PRN Meds: sodium chloride, sodium chloride, acetaminophen, hydrALAZINE,  loperamide, metoprolol tartrate, promethazine, sodium chloride flush, sodium chloride flush   Vital Signs    Vitals:   03/14/17 0600 03/14/17 0700 03/14/17 0811 03/14/17 0850  BP: 125/80 127/71  131/69  Pulse: 73 75  83  Resp: 13 18    Temp:   98.1 F (36.7 C)   TempSrc:   Oral   SpO2: 96% 97%    Weight:      Height:        Intake/Output Summary (Last 24 hours) at 03/14/2017 0916 Last data filed at 03/14/2017 0830 Gross per 24 hour  Intake 280 ml  Output 1851 ml  Net -1571 ml   Filed Weights   03/10/17 0918 03/11/17 0530  Weight: 235 lb (106.6 kg) 238 lb 6.4 oz (108.1 kg)    Telemetry    SR- (96s-low 80s) - Personally Reviewed  ECG    No new EKG  Physical Exam   Physical Exam  Constitutional: She is oriented to person, place, and time. She appears well-developed and well-nourished. No distress.  Obese  HENT:  Head: Normocephalic and atraumatic.  Eyes: EOM are normal.  Neck: No hepatojugular reflux and no JVD present. Carotid bruit is not present.  Cardiovascular: Normal rate, regular rhythm, normal heart sounds, intact distal pulses and normal pulses.  No extrasystoles are present. PMI is not displaced (difficult to palpate). Exam reveals no gallop and no friction rub.  No murmur heard. Pulmonary/Chest: Effort normal and breath sounds normal. No respiratory distress. She has no wheezes. She has no rales.  Frequent coughing  Abdominal: Soft. Bowel sounds are normal. She exhibits no distension. There is no tenderness. There is no rebound.  Musculoskeletal: Normal range of motion. She exhibits no edema.  Neurological: She is alert and oriented to person, place, and time.  Skin: Skin is warm and dry.  Psychiatric: She has a normal mood and affect. Her behavior is normal. Judgment and thought content normal.  A bit groggy from sedation (Phenergan)  Nursing note and vitals reviewed.   Labs    Chemistry Recent Labs  Lab 03/12/17 0025 03/13/17 0253  03/14/17 0226  NA 133* 130* 132*  K 3.3* 2.9* 3.6  CL 99* 98* 102  CO2 21* 21* 19*  GLUCOSE 139* 70 117*  BUN 22* 27* 33*  CREATININE 1.57* 1.73* 2.05*  CALCIUM 9.0 8.3* 8.1*  GFRNONAA 36* 32* 26*  GFRAA 41* 37* 30*  ANIONGAP 13 11 11      Hematology Recent Labs  Lab 03/10/17 1632 03/11/17 0354 03/13/17 0253  WBC 15.6* 17.0* 15.0*  RBC 4.20 4.05 3.90  HGB 11.1* 10.7* 10.3*  HCT 34.7* 33.5* 32.2*  MCV 82.6 82.7 82.6  MCH 26.4 26.4 26.4  MCHC 32.0 31.9 32.0  RDW 14.2 14.5 15.0  PLT 229 242 225    Cardiac Enzymes Recent Labs  Lab 03/11/17 1131 03/11/17 1729 03/12/17 0025  TROPONINI 17.49* 17.88* 16.46*   No results for input(s): TROPIPOC in the last 168 hours.   BNPNo results for input(s): BNP, PROBNP in the last 168 hours.   DDimer No results for input(s): DDIMER in the last 168 hours.   Radiology    No results found.  Cardiac Studies    Left Heart Cath/Coronary Angiography/PCI 03/10/2017: PCI pLAD 100% STENT SYNERGY DES 3X32   1. 3 vessel obstructive CAD    - 100% mid LAD occlusion - this is the culprit lesion    - severe disease in distal LCx/OM3. This area is too small for revascularization    - diffuse 80% proximal to mid RCA 2. Mildly elevated LVEDP 3. Successful PCI with stenting of the mid LAD with DES. Persistent total occlusion of the the distal LAD  Plan: DAPT for one year. IV Aggrastat for 18 hours. BP control with IV Ntg. Assess LV function with Echo. Would consider PCI of the RCA prior to DC home.    2D Echo 03/11/2017: Moderate concentric LVH with akinesis of the mid anterior septal, anterior and apical anterior as well as septal and inferior walls.  Is severely reduced EF of 30-35%.  GRII DD.   Assessment & Plan    Principal Problem:   Acute ST elevation myocardial infarction (STEMI) involving left anterior descending (LAD) coronary artery (HCC) Active Problems:   Coronary artery disease involving native coronary artery of native  heart with unstable angina pectoris (HCC)   Acute combined systolic and diastolic heart failure (HCC)   Type 2 diabetes mellitus with complication, with long-term current use of insulin (HCC)   Essential hypertension   Hyperlipidemia with target low density lipoprotein (LDL) cholesterol less than 70 mg/dL   Chronic anemia   Persistent cough   Nausea   Acute renal failure d/t procedure   Ischemic cardiomyopathy   Anterior STEMI with multivessel CAD noted on cath: Status post PCI to the LAD.   Echocardiogram revealed severely reduced EF of 30-35%, anterior akinesis consistent with a  large LAD infarct this coincides with elevated LVEDP on cardiac catheterization..    On aspirin plus Brilinta (DAPT times 1 year)  Remains on carvedilol and lisinopril (holding lisinopril secondary to renal insufficiency)  Had planned on staged PCI to RCA today, but deferred due to worsening renal function.  At this point, would probably prefer to stabilize her medically and consider staged PCI at a later date when she has recovered more from her MI and CHF.  Ischemic cardiomyopathy with acute combined systolic and diastolic heart failure post MI: EF 30-35% by echo Was diuresed over the weekend, but this was discontinued secondary to acute renal insufficiency.  Now diuretics are on hold.->   Hold lisinopril due to renal insufficiency. ->  Would consider converting to [facility] once her renal insufficiency improves.  Acute renal insufficiency: Likely related to contrast and IV Lasix diuresis (combination ATN and contrast-induced)  Gentle hydration today as she is not still not eating and drinking well and having loose stools  Holding Lasix and lisinopril  HTN: Blood pressure currently stable  On carvedilol plus lisinopril -however will hold lisinopril today.  As needed hydralazine ordered (if renal function does not improve, would use hydralazine/nitrate in the above ACE inhibitor/ARB)  HLD: Continue  high-dose atorvastatin 80 mg daily  Insulin dependent diabetes (A1c 8.6): On standing NovoLog plus twice daily Levemir with sliding scale.    Stable glucose levels -if anything, somewhat hypoglycemic.  Will change Levemir to once daily as inpatient (may need to reassess for discharge) she usually doses herself based upon blood sugars. -   Persistent cough: Now resolved chest x-ray was nonfocal.  White blood cell count reducing  Nausea: Resolved, now having some loose stools  Hypokalemia: Continue to replace with oral potassium   Plan for now is to monitor her recovery renal function and ensure no further heart failure exacerbation.  Hold diuretic and ACE inhibitor for now.  Would like to convert to [facility] as outpatient. With low EF from an acute anterior infarct, will consider LifeVest for discharge.  Okay to transfer to telemetry. Anticipate at least 2 more days inpatient.   For questions or updates, please contact [place] Please consult [email or web address] for contact info under Cardiology/STEMI.      Signed, Glenetta Hew, MD  03/14/2017, 9:16 AM

## 2017-03-15 DIAGNOSIS — D509 Iron deficiency anemia, unspecified: Secondary | ICD-10-CM

## 2017-03-15 DIAGNOSIS — Z9189 Other specified personal risk factors, not elsewhere classified: Secondary | ICD-10-CM

## 2017-03-15 HISTORY — DX: Iron deficiency anemia, unspecified: D50.9

## 2017-03-15 LAB — COMPREHENSIVE METABOLIC PANEL
ALBUMIN: 2.6 g/dL — AB (ref 3.5–5.0)
ALK PHOS: 59 U/L (ref 38–126)
ALT: 22 U/L (ref 14–54)
AST: 30 U/L (ref 15–41)
Anion gap: 11 (ref 5–15)
BILIRUBIN TOTAL: 0.6 mg/dL (ref 0.3–1.2)
BUN: 35 mg/dL — AB (ref 6–20)
CO2: 20 mmol/L — ABNORMAL LOW (ref 22–32)
CREATININE: 1.86 mg/dL — AB (ref 0.44–1.00)
Calcium: 8 mg/dL — ABNORMAL LOW (ref 8.9–10.3)
Chloride: 103 mmol/L (ref 101–111)
GFR calc Af Amer: 34 mL/min — ABNORMAL LOW (ref >60)
GFR, EST NON AFRICAN AMERICAN: 29 mL/min — AB (ref >60)
GLUCOSE: 159 mg/dL — AB (ref 65–99)
POTASSIUM: 3.6 mmol/L (ref 3.5–5.1)
Sodium: 134 mmol/L — ABNORMAL LOW (ref 135–145)
TOTAL PROTEIN: 6.3 g/dL — AB (ref 6.5–8.1)

## 2017-03-15 LAB — CBC
HEMATOCRIT: 29.4 % — AB (ref 36.0–46.0)
Hemoglobin: 9.3 g/dL — ABNORMAL LOW (ref 12.0–15.0)
MCH: 26.3 pg (ref 26.0–34.0)
MCHC: 31.6 g/dL (ref 30.0–36.0)
MCV: 83.3 fL (ref 78.0–100.0)
PLATELETS: 224 10*3/uL (ref 150–400)
RBC: 3.53 MIL/uL — ABNORMAL LOW (ref 3.87–5.11)
RDW: 14.9 % (ref 11.5–15.5)
WBC: 11.7 10*3/uL — ABNORMAL HIGH (ref 4.0–10.5)

## 2017-03-15 LAB — GLUCOSE, CAPILLARY
GLUCOSE-CAPILLARY: 89 mg/dL (ref 65–99)
GLUCOSE-CAPILLARY: 193 mg/dL — AB (ref 65–99)
Glucose-Capillary: 131 mg/dL — ABNORMAL HIGH (ref 65–99)
Glucose-Capillary: 140 mg/dL — ABNORMAL HIGH (ref 65–99)
Glucose-Capillary: 143 mg/dL — ABNORMAL HIGH (ref 65–99)
Glucose-Capillary: 161 mg/dL — ABNORMAL HIGH (ref 65–99)

## 2017-03-15 MED ORDER — NITROGLYCERIN 0.4 MG SL SUBL
SUBLINGUAL_TABLET | SUBLINGUAL | Status: AC
  Filled 2017-03-15: qty 1

## 2017-03-15 MED ORDER — POTASSIUM CHLORIDE CRYS ER 20 MEQ PO TBCR: 30.0000 meq | EXTENDED_RELEASE_TABLET | Freq: Two times a day (BID) | ORAL | Status: DC

## 2017-03-15 MED ORDER — ALUM & MAG HYDROXIDE-SIMETH 200-200-20 MG/5ML PO SUSP
30.0000 mL | ORAL | Status: DC | PRN
  Administered 2017-03-15: 30 mL via ORAL
  Filled 2017-03-15: qty 30

## 2017-03-15 MED ORDER — NITROGLYCERIN 0.4 MG SL SUBL
0.4000 mg | SUBLINGUAL_TABLET | SUBLINGUAL | Status: DC | PRN
  Administered 2017-03-15: 0.4 mg via SUBLINGUAL

## 2017-03-15 MED ORDER — POTASSIUM CHLORIDE CRYS ER 20 MEQ PO TBCR
20.0000 meq | EXTENDED_RELEASE_TABLET | Freq: Two times a day (BID) | ORAL | Status: DC
  Administered 2017-03-15 – 2017-03-16 (×3): 20 meq via ORAL
  Filled 2017-03-15 (×4): qty 1

## 2017-03-15 NOTE — Progress Notes (Signed)
Progress Note  Patient Name: Stephanie Manning Date of Encounter: 03/15/2017  Primary Cardiologist: No primary care provider on file.   Patient Profile     57 y.o. female with no prior history of CAD who was transferred from [facility] rocking him for an anterior STEMI.  She does have history of diabetes mellitus type 2 on insulin, hypertension and hyperlipidemia. Her STEMI anginal pain consult was consistent with acute severe chest pain radiating to the ear and jaw associated with nausea and vomiting.  Initial EKG was relatively unremarkable, however troponin was slightly elevated and second EKG was more consistent with ST elevations.  She was then brought to the proximal, Cath Lab.  Cath showed 100% proximal-mid LAD treated with DES stent.  She also has distal LAD occlusion at the apex along with 80% RCA as well as 90% distal circumflex disease.  Subjective   Transferred out of ICU yesterday.  Did relatively well until roughly midnight when she had some chest tightness and indigestion.  Minimal relief was able to nitroglycerin, improved relief with Maalox. -She notes that she still feels a little bit bloated and gassy, but no more diarrhea.  Overall, she says she feels much better  Paperwork initiated for LifeVest  Inpatient Medications    Scheduled Meds: . aspirin  81 mg Oral Daily  . atorvastatin  80 mg Oral q1800  . carvedilol  18.75 mg Oral BID WC  . enoxaparin (LOVENOX) injection  40 mg Subcutaneous Q24H  . insulin aspart  0-15 Units Subcutaneous TID WC  . insulin aspart  10 Units Subcutaneous TID WC  . insulin detemir  40 Units Subcutaneous QHS  . nitroGLYCERIN      . pantoprazole  40 mg Oral Daily  . sodium chloride flush  3 mL Intravenous Q12H  . ticagrelor  90 mg Oral BID  . Vitamin D (Ergocalciferol)  50,000 Units Oral Q7 days   Continuous Infusions: . sodium chloride     PRN Meds: sodium chloride, acetaminophen, alum & mag hydroxide-simeth, hydrALAZINE, loperamide,  metoprolol tartrate, nitroGLYCERIN, promethazine, sodium chloride flush   Vital Signs    Vitals:   03/14/17 2044 03/15/17 0010 03/15/17 0510 03/15/17 0831  BP: 135/64 121/73 125/69 110/69  Pulse: 80 78 72 85  Resp: 18 19 14    Temp: 98.4 F (36.9 C)  98.1 F (36.7 C)   TempSrc: Oral  Oral   SpO2: 100% 100% 98%   Weight:   237 lb 4.8 oz (107.6 kg)   Height:        Intake/Output Summary (Last 24 hours) at 03/15/2017 1038 Last data filed at 03/15/2017 0859 Gross per 24 hour  Intake 890 ml  Output 1100 ml  Net -210 ml   Filed Weights   03/11/17 0530 03/14/17 1700 03/15/17 0510  Weight: 238 lb 6.4 oz (108.1 kg) 237 lb 14.4 oz (107.9 kg) 237 lb 4.8 oz (107.6 kg)    Telemetry    SR- (70-80s) - Personally Reviewed  ECG    EKG from midnight: Sinus rhythm, rate 80 bpm.  LVH with lateral T wave inversions/repolarization abnormality versus ischemia (versus evolutionary changes of anterior MI)  Physical Exam   Physical Exam  Constitutional: She is oriented to person, place, and time. She appears well-developed and well-nourished. No distress.  Obese; feels much better than she has over the last few days.  HENT:  Head: Normocephalic and atraumatic.  Eyes: EOM are normal.  Neck: No hepatojugular reflux and no JVD present. Carotid  bruit is not present.  Cardiovascular: Normal rate, regular rhythm, normal heart sounds, intact distal pulses and normal pulses.  No extrasystoles are present. PMI is not displaced (difficult to palpate). Exam reveals no gallop and no friction rub.  No murmur heard. Pulmonary/Chest: Effort normal and breath sounds normal. No respiratory distress. She has no wheezes. She has no rales.  Abdominal: Soft. Bowel sounds are normal. She exhibits no distension. There is no tenderness. There is no rebound.  Musculoskeletal: Normal range of motion. She exhibits no edema.  Neurological: She is alert and oriented to person, place, and time.  Skin: Skin is warm and dry.    Psychiatric: She has a normal mood and affect. Her behavior is normal. Judgment and thought content normal.  Nursing note and vitals reviewed.   Labs    Chemistry Recent Labs  Lab 03/13/17 0253 03/14/17 0226 03/15/17 0253  NA 130* 132* 134*  K 2.9* 3.6 3.6  CL 98* 102 103  CO2 21* 19* 20*  GLUCOSE 70 117* 159*  BUN 27* 33* 35*  CREATININE 1.73* 2.05* 1.86*  CALCIUM 8.3* 8.1* 8.0*  PROT  --   --  6.3*  ALBUMIN  --   --  2.6*  AST  --   --  30  ALT  --   --  22  ALKPHOS  --   --  59  BILITOT  --   --  0.6  GFRNONAA 32* 26* 29*  GFRAA 37* 30* 34*  ANIONGAP 11 11 11      Hematology Recent Labs  Lab 03/11/17 0354 03/13/17 0253 03/15/17 0253  WBC 17.0* 15.0* 11.7*  RBC 4.05 3.90 3.53*  HGB 10.7* 10.3* 9.3*  HCT 33.5* 32.2* 29.4*  MCV 82.7 82.6 83.3  MCH 26.4 26.4 26.3  MCHC 31.9 32.0 31.6  RDW 14.5 15.0 14.9  PLT 242 225 224    Cardiac Enzymes Recent Labs  Lab 03/11/17 1131 03/11/17 1729 03/12/17 0025  TROPONINI 17.49* 17.88* 16.46*   No results for input(s): TROPIPOC in the last 168 hours.   BNPNo results for input(s): BNP, PROBNP in the last 168 hours.   DDimer No results for input(s): DDIMER in the last 168 hours.   Radiology    No results found.  Cardiac Studies    Left Heart Cath/Coronary Angiography/PCI 03/10/2017: PCI pLAD 100% STENT SYNERGY DES 3X32   1. 3 vessel obstructive CAD    - 100% mid LAD occlusion - this is the culprit lesion    - severe disease in distal LCx/OM3. This area is too small for revascularization    - diffuse 80% proximal to mid RCA - consider staged PCI 2. Mildly elevated LVEDP 3. Successful DES PCI of the mid LAD with Persistent total occlusion of the the distal LAD  Plan: DAPT for one year. IV Aggrastat for 18 hours. BP control with IV Ntg. Assess LV function with Echo. Would consider PCI of the RCA prior to DC home.    2D Echo 03/11/2017: Moderate concentric LVH with akinesis of the mid anterior septal,  anterior and apical anterior as well as septal and inferior walls.  Is severely reduced EF of 30-35%.  GRII DD.   Assessment & Plan    Principal Problem:   Acute ST elevation myocardial infarction (STEMI) involving left anterior descending (LAD) coronary artery (HCC) Active Problems:   Coronary artery disease involving native coronary artery of native heart with unstable angina pectoris (HCC)   Acute combined systolic and diastolic heart  failure (HCC)   At risk for sudden cardiac death   Type 2 diabetes mellitus with complication, with long-term current use of insulin (HCC)   Essential hypertension   Hyperlipidemia with target low density lipoprotein (LDL) cholesterol less than 70 mg/dL   Chronic anemia   Persistent cough   Nausea   Acute renal failure d/t procedure   Ischemic cardiomyopathy   Anterior STEMI with multivessel CAD noted on cath: Status post PCI to the culprit lesion in LAD.  Echo confirmed severely reduced EF of 30-35% with anterior akinesis consistent with LAD infarct.  Also findings consistent with elevated LVEDP.   Continue DAPT times 1 year (aspirin/Brilinta)  On stable dose carvedilol.  Lisinopril being held because of renal insufficiency.  Original plan was staged PCI to the RCA on Monday 12/31.  Deferred due to worsening renal function.   Depending on symptoms, potentially best bet would be to discharge home and have her return in 2-3 weeks for PCI to the RCA. -->  However, his renal function stabilized, and her discharge will be delayed secondary to LifeVest, can consider staged PCI tomorrow (will determine based on renal function and any potential recurrent chest pain in the morning)   Ischemic cardiomyopathy with acute combined systolic and diastolic heart failure post MI: EF 30-35% by echo Diuresis over the weekend discontinued secondary to worsening renal insufficiency.  Now diuretic on hold along with ACE inhibitor.  Once renal function stabilizes,  would consider low-dose Entresto instead of restarting ACE inhibitor --> however this would likely be done as an outpatient  We will discharge on at least a as needed Lasix dose  Acute renal insufficiency: Likely related to contrast and IV Lasix diuresis (combination ATN and contrast-induced) -creatinine back down to 1.86 from 2.04 with gentle hydration.  Holding Lasix and lisinopril  Follow renal function. -Would not restart ACE inhibitor or Entresto during this hospitalization based on normalization of renal function.  Would add at first outpatient visit.   HTN: Blood pressure currently stable on beta-blocker alone.  Has not required as needed hydralazine or metoprolol.  HLD: High-dose statin  Insulin dependent diabetes (A1c 8.6): Glucose levels stable.  Converted from twice daily Levemir to once daily along with NovoLog meal coverage and sliding scale.   Persistent cough: Resolved  Nausea: Resolved, --> more indigestion  Loose stools: Resolved  Hypokalemia: Continue to replace with oral potassium; K-Dur 20 mg twice daily order   Plan for now is to monitor her recovery renal function and ensure no further heart failure exacerbation.  Hold diuretic and ACE inhibitor for now.  Would like to convert to [facility] as outpatient. With low EF from an acute anterior infarct, (AT [place]) have ordered LifeVest for discharge.   Anticipate d/c pending LifeVest.   For questions or updates, please contact [place] Please consult [email or web address] for contact info under Cardiology/STEMI.      Signed, Glenetta Hew, MD  03/15/2017, 10:38 AM

## 2017-03-15 NOTE — Progress Notes (Signed)
Pt reported chest tightness 7/10 and indigestion at 0005 to NT after ambulating to bathroom.  VSS, EKG obtained and reviewed by Dr. Kenton Kingfisher who was on department at that time.  Pt received some relief (pt declined to rate on pain scale) with 1 SL ntg and oxygen at 2L per [place].  Pt requested antacid.  Pt given maalox @ 0033 per orders with relief of symptoms.  Will continue to monitor pt closely.

## 2017-03-16 ENCOUNTER — Encounter (HOSPITAL_COMMUNITY)
Admission: EM | Disposition: A | Discharge status: Home / Self Care | Payer: Self-pay | Source: Ambulatory Visit | Attending: Cardiology

## 2017-03-16 DIAGNOSIS — Z9189 Other specified personal risk factors, not elsewhere classified: Secondary | ICD-10-CM

## 2017-03-16 DIAGNOSIS — Z955 Presence of coronary angioplasty implant and graft: Secondary | ICD-10-CM

## 2017-03-16 LAB — BASIC METABOLIC PANEL
Anion gap: 6 (ref 5–15)
BUN: 23 mg/dL — AB (ref 6–20)
CHLORIDE: 108 mmol/L (ref 101–111)
CO2: 22 mmol/L (ref 22–32)
CREATININE: 1.64 mg/dL — AB (ref 0.44–1.00)
Calcium: 8.4 mg/dL — ABNORMAL LOW (ref 8.9–10.3)
GFR calc Af Amer: 39 mL/min — ABNORMAL LOW (ref >60)
GFR calc non Af Amer: 34 mL/min — ABNORMAL LOW (ref >60)
Glucose, Bld: 97 mg/dL (ref 65–99)
Potassium: 3.8 mmol/L (ref 3.5–5.1)
SODIUM: 136 mmol/L (ref 135–145)

## 2017-03-16 LAB — GLUCOSE, CAPILLARY
GLUCOSE-CAPILLARY: 87 mg/dL (ref 65–99)
Glucose-Capillary: 86 mg/dL (ref 65–99)
Glucose-Capillary: 129 mg/dL — ABNORMAL HIGH (ref 65–99)
Glucose-Capillary: 181 mg/dL — ABNORMAL HIGH (ref 65–99)
Glucose-Capillary: 93 mg/dL (ref 65–99)

## 2017-03-16 LAB — CBC
HCT: 28 % — ABNORMAL LOW (ref 36.0–46.0)
HEMOGLOBIN: 8.8 g/dL — AB (ref 12.0–15.0)
MCH: 26.3 pg (ref 26.0–34.0)
MCHC: 31.4 g/dL (ref 30.0–36.0)
MCV: 83.6 fL (ref 78.0–100.0)
PLATELETS: 239 10*3/uL (ref 150–400)
RBC: 3.35 MIL/uL — ABNORMAL LOW (ref 3.87–5.11)
RDW: 14.6 % (ref 11.5–15.5)
WBC: 10.6 10*3/uL — ABNORMAL HIGH (ref 4.0–10.5)

## 2017-03-16 LAB — PROTIME-INR
INR: 1
PROTHROMBIN TIME: 13.1 s (ref 11.4–15.2)

## 2017-03-16 SURGERY — CORONARY STENT INTERVENTION
Anesthesia: LOCAL

## 2017-03-16 MED ORDER — POTASSIUM CHLORIDE CRYS ER 20 MEQ PO TBCR: 20.0000 meq | EXTENDED_RELEASE_TABLET | Freq: Two times a day (BID) | ORAL | 1 refills | Status: DC

## 2017-03-16 MED ORDER — PANTOPRAZOLE SODIUM 40 MG PO TBEC: 40.0000 mg | DELAYED_RELEASE_TABLET | Freq: Every day | ORAL | 2 refills | Status: DC

## 2017-03-16 MED ORDER — ASPIRIN 81 MG PO CHEW: 81.0000 mg | CHEWABLE_TABLET | Freq: Every day | ORAL | Status: AC

## 2017-03-16 MED ORDER — NITROGLYCERIN 0.4 MG SL SUBL: 0.4000 mg | SUBLINGUAL_TABLET | SUBLINGUAL | 2 refills | Status: DC | PRN

## 2017-03-16 MED ORDER — ATORVASTATIN CALCIUM 80 MG PO TABS: 80.0000 mg | ORAL_TABLET | Freq: Every day | ORAL | 1 refills | Status: DC

## 2017-03-16 MED ORDER — TICAGRELOR 90 MG PO TABS: 90.0000 mg | ORAL_TABLET | Freq: Two times a day (BID) | ORAL | 2 refills | Status: DC

## 2017-03-16 MED ORDER — CARVEDILOL 6.25 MG PO TABS: 18.7500 mg | ORAL_TABLET | Freq: Two times a day (BID) | ORAL | 1 refills | Status: DC

## 2017-03-16 NOTE — Discharge Summary (Signed)
Discharge Summary    Patient ID: Stephanie Manning,  MRN: [id number], DOB/AGE: January 16, 1961 57 y.o.  Admit date: 03/10/2017 Discharge date: 03/16/2017  Primary Care Provider: Patient, No Pcp Per Primary Cardiologist: [name]   Discharge Diagnoses    Principal Problem:   Acute ST elevation myocardial infarction (STEMI) involving left anterior descending (LAD) coronary artery (HCC) Active Problems:   Type 2 diabetes mellitus with complication, with long-term current use of insulin (HCC)   Essential hypertension   Hyperlipidemia with target low density lipoprotein (LDL) cholesterol less than 70 mg/dL   Chronic anemia   Coronary artery disease involving native coronary artery of native heart with unstable angina pectoris (HCC)   Persistent cough   Nausea   Acute renal failure d/t procedure   Ischemic cardiomyopathy   Acute combined systolic and diastolic heart failure (HCC)   At risk for sudden cardiac death   Allergies Allergies  Allergen Reactions  . Amoxicillin Other (See Comments)    Yeast infection during treatment  . Hydrocodone Rash    Palpatations, gi upset  . Lisinopril Other (See Comments)    Chest pain, palpatations  . Meloxicam Nausea Only  . Strawberry Extract Hives    Does not always happen  . Tape Rash    Some tapes cause rashes    Diagnostic Studies/Procedures    Cath: 03/10/17  Conclusion     Prox RCA to Mid RCA lesion is 80% stenosed.  Dist LAD lesion is 100% stenosed.  Prox LAD lesion is 100% stenosed.  Prox Cx to Mid Cx lesion is 50% stenosed.  Mid Cx lesion is 85% stenosed.  Dist Cx lesion is 90% stenosed.  3rd Mrg lesion is 90% stenosed.  A drug-eluting stent was successfully placed using a STENT SYNERGY DES 3X32.  Post intervention, there is a 0% residual stenosis.  LV end diastolic pressure is mildly elevated.   1. 3 vessel obstructive CAD    - 100% mid LAD occlusion - this is the culprit lesion    - severe disease in  distal LCx/OM3. This area is too small for revascularization    - diffuse 80% proximal to mid RCA 2. Mildly elevated LVEDP 3. Successful PCI with stenting of the mid LAD with DES. Persistent total occlusion of the the distal LAD  Plan: DAPT for one year. IV Aggrastat for 18 hours. BP control with IV Ntg. Assess LV function with Echo. Would consider PCI of the RCA prior to DC home.    TTE: 03/11/17  Study Conclusions  - Left ventricle: The cavity size was normal. There was moderate   concentric hypertrophy. Systolic function was moderately to   severely reduced. The estimated ejection fraction was in the   range of 30% to 35%. Features are consistent with a pseudonormal   left ventricular filling pattern, with concomitant abnormal   relaxation and increased filling pressure (grade 2 diastolic   dysfunction). Doppler parameters are consistent with elevated   ventricular end-diastolic filling pressure. - Aortic valve: Trileaflet; normal thickness leaflets. There was no   regurgitation. - Mitral valve: There was mild regurgitation. - Right ventricle: The cavity size was normal. Wall thickness was   normal. Systolic function was normal. - Tricuspid valve: There was mild regurgitation. - Pulmonary arteries: The main pulmonary artery was normal-sized.   Systolic pressure was within the normal range. - Inferior vena cava: The vessel was normal in size. - Pericardium, extracardiac: There was no pericardial effusion.  Impressions:  - There  is akinesis of the mid anteroseptal, anterior and apical   anterior, septal and inferior walls. No thrombus is seen on the   Definity echocontrast images. _____________   History of Present Illness     Mrs. Stephanie Manning is a 57 yo female with PMH of IDDM, HL, HTN, anemia. She reports being followed by her PCP for chronic illnesses. Remote hx of stress test in the past. She presented to [facility] earlier the morning of admission with chest pain.  Reported developing SScp around midnight the night prior. Symptoms persisted and she presented to the ED there around 4am. Initial EKG at 0409am showed SR with ST changes in the V2-V3 that were somewhat similar to previous EKG in 12/17.  Troponin was 0.10. She was placed on IV heparin. Labs showed ProBNP 2215, Cr 1.2, Hgb 10.7. CXR reported concern for edema. Repeat EKG at 0801am showed worsening ST elevation in v2-v3 and she continued to have ongoing chest pain. Code STEMI was called and she was transferred to [facility] emergently for cardiac cath. She reported at 10/10 chest pain on arrival.   Hospital Course     Underwent cardiac cath noted above with Dr. [name], three-vessel obstructive CAD noted.  100% mid LAD occlusion, which was felt to be the culprit lesion with successful PCI/DES x1 with persistent occlusion of the distal LAD.  Also noted severe disease in the distal circumflex, and diffuse disease in the proximal to mid RCA.  She is continued on IV Aggrastat for 18 hours, along with IV nitroglycerin for her blood pressure.  Noted to have a moderately elevated LVEDP.  Follow-up echocardiogram showed EF of 30-35%, with grade 2 diastolic dysfunction.  Plan was for staged intervention to her RCA, but creatinine elevated  and peaked at 2.  Staged intervention was therefore delayed, and she was given gentle IV hydration. Creatinine improved to 1.64 by the time of discharge.  She was started on Coreg, which was titrated up as blood pressure tolerated.  Initially was on ACEi but this was stopped secondary to her rising creatinine.  LDL was 149, and she was placed on high-dose statin.  She is an insulin-dependent diabetic, and her hemoglobin A1c was 8.6.  Did have some transient hypokalemia which resolved.  Troponin peaked at 17.88.  She did have some intermittent nausea/indigestion post cath but this resolved.  She was able to work with cardiac rehab without any further chest pain.  Decision was made to allow her  to further recover from her MI, and allow for her kidney function to improve.  Plan will be to discharge her, and arrange for close follow-up.  At that time we will plan for scheduling staged intervention.  Given her decline in her EF, she was set up for the LifeVest prior to discharge.   [name] was seen by Dr. Ellyn Hack and determined stable for discharge home. Follow up in the office has been arranged. Medications are listed below.   _____________  Discharge Vitals Blood pressure 119/63, pulse 79, temperature 97.9 F (36.6 C), temperature source Oral, resp. rate 18, height 5\' 8"  (1.727 m), weight 238 lb 8 oz (108.2 kg), SpO2 100 %.  Filed Weights   03/14/17 1700 03/15/17 0510 03/16/17 0509  Weight: 237 lb 14.4 oz (107.9 kg) 237 lb 4.8 oz (107.6 kg) 238 lb 8 oz (108.2 kg)    Labs & Radiologic Studies    CBC Recent Labs    03/15/17 0253 03/16/17 0411  WBC 11.7* 10.6*  HGB  9.3* 8.8*  HCT 29.4* 28.0*  MCV 83.3 83.6  PLT 224 622   Basic Metabolic Panel Recent Labs    03/15/17 0253 03/16/17 0411  NA 134* 136  K 3.6 3.8  CL 103 108  CO2 20* 22  GLUCOSE 159* 97  BUN 35* 23*  CREATININE 1.86* 1.64*  CALCIUM 8.0* 8.4*   Liver Function Tests Recent Labs    03/15/17 0253  AST 30  ALT 22  ALKPHOS 59  BILITOT 0.6  PROT 6.3*  ALBUMIN 2.6*   No results for input(s): LIPASE, AMYLASE in the last 72 hours. Cardiac Enzymes No results for input(s): CKTOTAL, CKMB, CKMBINDEX, TROPONINI in the last 72 hours. BNP Invalid input(s): POCBNP D-Dimer No results for input(s): DDIMER in the last 72 hours. Hemoglobin A1C No results for input(s): HGBA1C in the last 72 hours. Fasting Lipid Panel No results for input(s): CHOL, HDL, LDLCALC, TRIG, CHOLHDL, LDLDIRECT in the last 72 hours. Thyroid Function Tests No results for input(s): TSH, T4TOTAL, T3FREE, THYROIDAB in the last 72 hours.  Invalid input(s): FREET3 _____________  Dg Chest Port 1 View  Result Date:  03/11/2017 CLINICAL DATA:  Myocardial infarction. Cardiac stents placed yesterday. EXAM: PORTABLE CHEST 1 VIEW COMPARISON:  None. FINDINGS: Heart size normal. Mild interstitial edema is present bilaterally. There is no significant airspace consolidation. No definite effusions are present. IMPRESSION: 1. Mild interstitial edema without focal airspace consolidation or effusions. This may represent early congestive heart failure. Electronically Signed   By: San Morelle M.D.   On: 03/11/2017 13:22   Disposition   Pt is being discharged home today in good condition.  Follow-up Plans & Appointments    Follow-up Information    Kateri Mc Follow up on 03/24/2017.   Why:  at 10am for your follow up appt.  Contact information: [facility] [address] [place] [address]         Discharge Instructions    (HEART FAILURE PATIENTS) Call MD:  Anytime you have any of the following symptoms: 1) 3 pound weight gain in 24 hours or 5 pounds in 1 week 2) shortness of breath, with or without a dry hacking cough 3) swelling in the hands, feet or stomach 4) if you have to sleep on extra pillows at night in order to breathe.   Complete by:  As directed    Amb Referral to Cardiac Rehabilitation   Complete by:  As directed    Diagnosis:   Coronary Stents PTCA STEMI     Call MD for:  redness, tenderness, or signs of infection (pain, swelling, redness, odor or green/yellow discharge around incision site)   Complete by:  As directed    Diet - low sodium heart healthy   Complete by:  As directed    Discharge instructions   Complete by:  As directed    Radial Site Care Refer to this sheet in the next few weeks. These instructions provide you with information on caring for yourself after your procedure. Your caregiver may also give you more specific instructions. Your treatment has been planned according to current medical practices, but problems sometimes  occur. Call your caregiver if you have any problems or questions after your procedure. HOME CARE INSTRUCTIONS You may shower the day after the procedure.Remove the bandage (dressing) and gently wash the site with plain soap and water.Gently pat the site dry.  Do not apply powder or lotion to the site.  Do not submerge the affected site in  water for 3 to 5 days.  Inspect the site at least twice daily.  Do not flex or bend the affected arm for 24 hours.  No lifting over 5 pounds (2.3 kg) for 5 days after your procedure.  Do not drive home if you are discharged the same day of the procedure. Have someone else drive you.  You may drive 24 hours after the procedure unless otherwise instructed by your caregiver.  What to expect: Any bruising will usually fade within 1 to 2 weeks.  Blood that collects in the tissue (hematoma) may be painful to the touch. It should usually decrease in size and tenderness within 1 to 2 weeks.  SEEK IMMEDIATE MEDICAL CARE IF: You have unusual pain at the radial site.  You have redness, warmth, swelling, or pain at the radial site.  You have drainage (other than a small amount of blood on the dressing).  You have chills.  You have a fever or persistent symptoms for more than 72 hours.  You have a fever and your symptoms suddenly get worse.  Your arm becomes pale, cool, tingly, or numb.  You have heavy bleeding from the site. Hold pressure on the site.   PLEASE DO NOT MISS ANY DOSES OF YOUR BRILINTA!!!!! Also keep a log of you blood pressures and bring back to your follow up appt. Please call the office with any questions.   Patients taking blood thinners should generally stay away from medicines like ibuprofen, Advil, Motrin, naproxen, and Aleve due to risk of stomach bleeding. You may take Tylenol as directed or talk to your primary doctor about alternatives.   Increase activity slowly   Complete by:  As directed       Discharge Medications     Medication  List    STOP taking these medications   amLODipine 10 MG tablet Commonly known as:  NORVASC   atenolol 50 MG tablet Commonly known as:  TENORMIN   enalapril 10 MG tablet Commonly known as:  VASOTEC   ibuprofen 400 MG tablet Commonly known as:  ADVIL,MOTRIN     TAKE these medications   aspirin 81 MG chewable tablet Chew 1 tablet (81 mg total) by mouth daily. Start taking on:  03/17/2017   atorvastatin 80 MG tablet Commonly known as:  LIPITOR Take 1 tablet (80 mg total) by mouth daily at 6 PM.   carvedilol 6.25 MG tablet Commonly known as:  COREG Take 3 tablets (18.75 mg total) by mouth 2 (two) times daily with a meal.   ECHINACEA EXTRACT PO Take 400 mg by mouth daily.   gabapentin 300 MG capsule Commonly known as:  NEURONTIN Take 600 mg by mouth at bedtime.   insulin aspart 100 UNIT/ML injection Commonly known as:  novoLOG Inject 10 Units into the skin 3 (three) times daily with meals.   insulin detemir 100 UNIT/ML injection Commonly known as:  LEVEMIR Inject 50 Units into the skin 2 (two) times daily.   multivitamin with minerals Tabs tablet Take 1 tablet by mouth daily.   nitroGLYCERIN 0.4 MG SL tablet Commonly known as:  NITROSTAT Place 1 tablet (0.4 mg total) under the tongue every 5 (five) minutes as needed for chest pain.   pantoprazole 40 MG tablet Commonly known as:  PROTONIX Take 1 tablet (40 mg total) by mouth daily. Start taking on:  03/17/2017   potassium chloride SA 20 MEQ tablet Commonly known as:  K-DUR,KLOR-CON Take 1 tablet (20 mEq total) by mouth 2 (two)  times daily.   sodium chloride 0.65 % Soln nasal spray Commonly known as:  OCEAN Place 1 spray into both nostrils as needed for congestion.   ticagrelor 90 MG Tabs tablet Commonly known as:  BRILINTA Take 1 tablet (90 mg total) by mouth 2 (two) times daily.   tiZANidine 4 MG tablet Commonly known as:  ZANAFLEX Take 4 mg by mouth at bedtime as needed for muscle spasms.   Vitamin D  (Ergocalciferol) 50000 units Caps capsule Commonly known as:  DRISDOL Take 50,000 Units by mouth every 7 (seven) days.        Aspirin prescribed at discharge?  Yes High Intensity Statin Prescribed? (Lipitor 40-80mg  or Crestor 20-40mg ): Yes Beta Blocker Prescribed? Yes For EF <40%, was ACEI/ARB Prescribed? No: Elevated Cr ADP Receptor Inhibitor Prescribed? (i.e. Plavix etc.-Includes Medically Managed Patients): Yes For EF <40%, Aldosterone Inhibitor Prescribed? No: Consider at outpatient appt if appropriate Was EF assessed during THIS hospitalization? Yes Was Cardiac Rehab II ordered? (Included Medically managed Patients): Yes   Outstanding Labs/Studies   FLP/LFTs in 6 weeks. BMET/CBC at follow up appt.   Duration of Discharge Encounter   Greater than 30 minutes including physician time.  Signed, Reino Bellis NP-C 03/16/2017, 2:02 PM

## 2017-03-16 NOTE — Progress Notes (Signed)
The patient has been given discharge instructions along with a new medication list and what medications to take today. He has been educated radial cath site care. She is leaving via car with her boyfriend.   Saddie Benders RN

## 2017-03-16 NOTE — Progress Notes (Signed)
Progress Note  Patient Name: Stephanie Manning Date of Encounter: 03/16/2017  Primary Cardiologist: Ellyn Hack ( Lives in [place])  Subjective   No chest pain.   Inpatient Medications    Scheduled Meds: . aspirin  81 mg Oral Daily  . atorvastatin  80 mg Oral q1800  . carvedilol  18.75 mg Oral BID WC  . enoxaparin (LOVENOX) injection  40 mg Subcutaneous Q24H  . insulin aspart  0-15 Units Subcutaneous TID WC  . insulin aspart  10 Units Subcutaneous TID WC  . insulin detemir  40 Units Subcutaneous QHS  . pantoprazole  40 mg Oral Daily  . potassium chloride  20 mEq Oral BID  . sodium chloride flush  3 mL Intravenous Q12H  . ticagrelor  90 mg Oral BID  . Vitamin D (Ergocalciferol)  50,000 Units Oral Q7 days   Continuous Infusions: . sodium chloride     PRN Meds: sodium chloride, acetaminophen, alum & mag hydroxide-simeth, hydrALAZINE, loperamide, metoprolol tartrate, nitroGLYCERIN, promethazine, sodium chloride flush   Vital Signs    Vitals:   03/15/17 1524 03/15/17 1722 03/15/17 2055 03/16/17 0509  BP: (!) 110/56 128/61 133/69 121/66  Pulse: 82 83 87 74  Resp: 16  18 18   Temp: 98.7 F (37.1 C)  99.6 F (37.6 C) 97.9 F (36.6 C)  TempSrc: Oral  Oral Oral  SpO2: 100%  100% 100%  Weight:    238 lb 8 oz (108.2 kg)  Height:        Intake/Output Summary (Last 24 hours) at 03/16/2017 0804 Last data filed at 03/16/2017 4765 Gross per 24 hour  Intake 1080 ml  Output 1900 ml  Net -820 ml   Filed Weights   03/14/17 1700 03/15/17 0510 03/16/17 0509  Weight: 237 lb 14.4 oz (107.9 kg) 237 lb 4.8 oz (107.6 kg) 238 lb 8 oz (108.2 kg)    Telemetry    SR - Personally Reviewed  Physical Exam   General: Well developed, well nourished, female appearing in no acute distress. Head: Normocephalic, atraumatic.  Neck: Supple without bruits, JVD. Lungs:  Resp regular and unlabored, CTA. Heart: RRR, S1, S2, no S3, S4, or murmur; no rub. Abdomen: Soft, non-tender, non-distended with  normoactive bowel sounds. No hepatomegaly. No rebound/guarding. No obvious abdominal masses. Extremities: No clubbing, cyanosis, edema. Distal pedal pulses are 2+ bilaterally. Right radial site bruised.  Neuro: Alert and oriented X 3. Moves all extremities spontaneously. Psych: Normal affect.  Labs    Chemistry Recent Labs  Lab 03/14/17 0226 03/15/17 0253 03/16/17 0411  NA 132* 134* 136  K 3.6 3.6 3.8  CL 102 103 108  CO2 19* 20* 22  GLUCOSE 117* 159* 97  BUN 33* 35* 23*  CREATININE 2.05* 1.86* 1.64*  CALCIUM 8.1* 8.0* 8.4*  PROT  --  6.3*  --   ALBUMIN  --  2.6*  --   AST  --  30  --   ALT  --  22  --   ALKPHOS  --  59  --   BILITOT  --  0.6  --   GFRNONAA 26* 29* 34*  GFRAA 30* 34* 39*  ANIONGAP 11 11 6      Hematology Recent Labs  Lab 03/13/17 0253 03/15/17 0253 03/16/17 0411  WBC 15.0* 11.7* 10.6*  RBC 3.90 3.53* 3.35*  HGB 10.3* 9.3* 8.8*  HCT 32.2* 29.4* 28.0*  MCV 82.6 83.3 83.6  MCH 26.4 26.3 26.3  MCHC 32.0 31.6 31.4  RDW 15.0 14.9  14.6  PLT 225 224 239    Cardiac Enzymes Recent Labs  Lab 03/11/17 1131 03/11/17 1729 03/12/17 0025  TROPONINI 17.49* 17.88* 16.46*   No results for input(s): TROPIPOC in the last 168 hours.   BNPNo results for input(s): BNP, PROBNP in the last 168 hours.   DDimer No results for input(s): DDIMER in the last 168 hours.    Radiology    No results found.  Cardiac Studies   Left Heart Cath/Coronary Angiography/PCI 03/10/2017: PCI pLAD 100% STENT SYNERGY DES 3X32   1. 3 vessel obstructive CAD - 100% mid LAD occlusion - this is the culprit lesion - severe disease in distal LCx/OM3. This area is too small for revascularization - diffuse 80% proximal to mid RCA 2. Mildly elevated LVEDP 3. Successful PCI with stenting of the mid LAD with DES. Persistent total occlusion of the the distal LAD  Plan: DAPT for one year. IV Aggrastat for 18 hours. BP control with IV Ntg. Assess LV function with Echo. Would  consider PCI of the RCA prior to DC home.   TTE: 03/11/17  Study Conclusions  - Left ventricle: The cavity size was normal. There was moderate   concentric hypertrophy. Systolic function was moderately to   severely reduced. The estimated ejection fraction was in the   range of 30% to 35%. Features are consistent with a pseudonormal   left ventricular filling pattern, with concomitant abnormal   relaxation and increased filling pressure (grade 2 diastolic   dysfunction). Doppler parameters are consistent with elevated   ventricular end-diastolic filling pressure. - Aortic valve: Trileaflet; normal thickness leaflets. There was no   regurgitation. - Mitral valve: There was mild regurgitation. - Right ventricle: The cavity size was normal. Wall thickness was   normal. Systolic function was normal. - Tricuspid valve: There was mild regurgitation. - Pulmonary arteries: The main pulmonary artery was normal-sized.   Systolic pressure was within the normal range. - Inferior vena cava: The vessel was normal in size. - Pericardium, extracardiac: There was no pericardial effusion.  Impressions:  - There is akinesis of the mid anteroseptal, anterior and apical   anterior, septal and inferior walls. No thrombus is seen on the   Definity echocontrast images.  Patient Profile     57 y.o. female with no prior history of CAD who was transferred from [facility] rocking him for an anterior STEMI.  She does have history of diabetes mellitus type 2 on insulin, hypertension and hyperlipidemia. Her STEMI anginal pain consult was consistent with acute severe chest pain radiating to the ear and jaw associated with nausea and vomiting.  Initial EKG was relatively unremarkable, however troponin was slightly elevated and second EKG was more consistent with ST elevations.  She was then brought to the proximal, Cath Lab.  Assessment & Plan    1. Anterior STEMI: s/p PCI/DES to the LAD. Plan for DAPT with  ASA/Brilinta. Will need staged intervention to the RCA, but Cr has been elevated. Improving but still needs time to recover. Troponin peaked at 17.88. No chest pain overnight.  -- on BB, ACEi stopped 2/2 to Cr.  -- awaiting Lifevest, discussed with Rep hopefully placed today  2. Acute Systolic HF: Echo showed EF of 30-35%. Was diuresed, but now on hold given elevated Cr. Volume stable on exam. On BB, but no ACEi/ARB given Cr. Consider Entresto as outpatient if renal function stabilizes.   3. HTN: stable with current therapy.  4. HL: LDL 149, on high dose statin  5. AKI: Cr peaked at 2.05, now down to 1.64 today.   6. IDDM: Hgb A1c 8.6. On SSI  7. Hypokalemia: Resolved  Signed, Reino Bellis, NP  03/16/2017, 8:04 AM  Pager # [phone]   For questions or updates, please contact [place] Please consult [email or web address] for contact info under Cardiology/STEMI.

## 2017-03-16 NOTE — Progress Notes (Addendum)
Patient is to go home on Life Vest; Clair Gulling with [employer] called, he is awaiting for insurance approval, then the patient will be fitted for the Vest; Mindi Slicker RN,MHA,BSn [phone]  1:43 pm- Patient has been approved for Life Vest; Clair Gulling will be in around 4 pm to fit the patient for his Vest. B Tamera Punt RN,MHA,BSN

## 2017-03-16 NOTE — Progress Notes (Signed)
Pt has been up in room. She declines ambulating now as she wants to get washed up. I encouraged her to walk in hall afterward. I talked with RN about helping her get set up with her monitor. We reviewed MI, Brilinta, HF, exercise, and diet. She has good understanding. We also briefly discussed the lifevest.  [phone] Yves Dill CES, ACSM 11:10 AM 03/16/2017

## 2017-03-21 DIAGNOSIS — I213 ST elevation (STEMI) myocardial infarction of unspecified site: Secondary | ICD-10-CM | POA: Diagnosis present

## 2017-03-24 ENCOUNTER — Encounter: Payer: Self-pay | Admitting: Adult Health

## 2017-03-24 ENCOUNTER — Ambulatory Visit (INDEPENDENT_AMBULATORY_CARE_PROVIDER_SITE_OTHER): Payer: Medicare Other | Admitting: Adult Health

## 2017-03-24 VITALS — BP 150/82 | HR 81 | Ht 68.0 in | Wt 246.0 lb

## 2017-03-24 DIAGNOSIS — I5043 Acute on chronic combined systolic (congestive) and diastolic (congestive) heart failure: Secondary | ICD-10-CM

## 2017-03-24 DIAGNOSIS — I2510 Atherosclerotic heart disease of native coronary artery without angina pectoris: Secondary | ICD-10-CM

## 2017-03-24 DIAGNOSIS — I10 Essential (primary) hypertension: Secondary | ICD-10-CM

## 2017-03-24 MED ORDER — CARVEDILOL 25 MG PO TABS: 25.0000 mg | ORAL_TABLET | Freq: Two times a day (BID) | ORAL | 3 refills | Status: DC

## 2017-03-24 NOTE — Progress Notes (Signed)
Cardiology Office Note   Date:  03/24/2017   ID:  Stephanie Manning, Strayer 02/22/1961, MRN [id number]  PCP:  Patient, No Pcp Per  Cardiologist:  Peter [name], MD Chief Complaint  Patient presents with  . Hospitalization Follow-up  . Coronary Artery Disease    Status post stent to LAD, with need for staged RCA intervention     History of Present Illness: [name] is a 57 y.o. 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  The patient was transferred from [facility] in the setting of ST elevated MI with complaints of subscapular pain which began the evening before.  Patient also had elevated proBNP of 2215.  Evidence of pulmonary edema per chest x-ray.  Cardiac catheterization was completed emergently which revealed a 100% LAD occlusion, with successful PCI/DES x1 with persistent occlusion of the distal LAD.  The patient also was found to have severe disease in the distal circumflex, mild diffuse disease in the proximal to mid RCA.    Echocardiogram revealed an LVEF of 30% to 35% with grade 2 diastolic dysfunction.  Subsequently the patient was planned for a staged intervention to her right coronary artery , however, creatinine was elevated.    He was given IV hydration strted arted on carvedilol, ACE inhibitor was not begun due to renal insufficiency.  The patient troponin peaked at 17.88.  Hemoglobin A1c was elevated at 8.5.  On follow-up appointment she was to be scheduled for staged intervention after recovering from MI.  LifeVest was scheduled prior to discharge.  She comes today with continued generalized fatigue, but denies any chest pain or dizziness.  She is very unhappy with LifeVest stating it is  ill fitting, beats frequently, and is very uncomfortable.  She would like to have this removed.  She denies any bleeding hemoptysis or melena on dual antiplatelet therapy. She is medically compliant.  Past Medical History:  Diagnosis Date  . Acute ST elevation myocardial infarction (STEMI) involving left anterior descending (LAD) coronary artery ([place]) 03/10/2017   100% pLAD - PCI with Synergy DES 3 x 32.  Residual distal LAD occlusion, proximal RCA 80%, distal circumflex disease.  . Anemia   . Coronary artery disease involving native coronary artery of native heart with unstable angina pectoris ([place]) 03/11/2017   Left Heart Cath/Coronary Angiography/PCI 03/10/2017: PCI pLAD 100% STENT SYNERGY DES; 1. 3 vessel obstructive CAD    - 100% mid LAD occlusion - this is the culprit lesion    - severe disease in distal LCx/OM3. This area is too small for revascularization    - diffuse 80% proximal to mid RCA 2. Mildly elevated LVEDP 3. Successful PCI with stenting of the mid LAD with DES. Persistent total occlusion  . Diabetes mellitus, type II, insulin dependent ([place])   . Hyperlipidemia   . Hypertension     Past Surgical History:  Procedure Laterality Date  . BILATERAL OOPHORECTOMY    . CESAREAN SECTION    . CORONARY/GRAFT ACUTE MI REVASCULARIZATION N/A 03/10/2017   Procedure: Coronary/Graft Acute MI Revascularization;  Surgeon: [name], [name], MD;  Location: [place];  Service: Cardiovascular;  Laterality: N/A;  . LEFT HEART CATH AND CORONARY ANGIOGRAPHY N/A 03/10/2017   Procedure: LEFT HEART CATH AND CORONARY ANGIOGRAPHY;  Surgeon: [name], Peter  M, MD;  Location: [facility];  Service: Cardiovascular;  Laterality: N/A;     Current Outpatient Medications  Medication Sig Dispense Refill  . aspirin 81 MG chewable tablet Chew 1 tablet (81 mg total) by mouth daily.    Marland Kitchen atorvastatin (LIPITOR) 80 MG tablet Take 1 tablet (80 mg total) by mouth daily at 6 PM. 90 tablet 1  .  carvedilol (COREG) 25 MG tablet Take 1 tablet (25 mg total) by mouth 2 (two) times daily with a meal. 30 tablet 3  . ECHINACEA EXTRACT PO Take 400 mg by mouth daily.    Marland Kitchen gabapentin (NEURONTIN) 300 MG capsule Take 600 mg by mouth at bedtime.    . insulin aspart (NOVOLOG) 100 UNIT/ML injection Inject 10 Units into the skin 3 (three) times daily with meals.    . insulin detemir (LEVEMIR) 100 UNIT/ML injection Inject 50 Units into the skin 2 (two) times daily.    . Multiple Vitamin (MULTIVITAMIN WITH MINERALS) TABS tablet Take 1 tablet by mouth daily.    . nitroGLYCERIN (NITROSTAT) 0.4 MG SL tablet Place 1 tablet (0.4 mg total) under the tongue every 5 (five) minutes as needed for chest pain. 25 tablet 2  . pantoprazole (PROTONIX) 40 MG tablet Take 1 tablet (40 mg total) by mouth daily. 30 tablet 2  . potassium chloride SA (K-DUR,KLOR-CON) 20 MEQ tablet Take 1 tablet (20 mEq total) by mouth 2 (two) times daily. 60 tablet 1  . sodium chloride (OCEAN) 0.65 % SOLN nasal spray Place 1 spray into both nostrils as needed for congestion.    . ticagrelor (BRILINTA) 90 MG TABS tablet Take 1 tablet (90 mg total) by mouth 2 (two) times daily. 180 tablet 2  . tiZANidine (ZANAFLEX) 4 MG tablet Take 4 mg by mouth at bedtime as needed for muscle spasms.    . Vitamin D, Ergocalciferol, (DRISDOL) 50000 units CAPS capsule Take 50,000 Units by mouth every 7 (seven) days.     No current Manning-administered medications for this visit.     Allergies:   Amoxicillin; Hydrocodone; Lisinopril; Meloxicam; Strawberry extract; and Tape    Social History:  The patient  reports that she has quit smoking. she has never used smokeless tobacco.   Family History:  The patient's family history includes Diabetes in her mother; Heart disease in her mother; Hypertension in her mother; Stroke in her mother.    ROS: All other systems are reviewed and negative. Unless otherwise mentioned in H&P    PHYSICAL EXAM: VS:  BP (!)  150/82   Pulse 81   Ht 5\' 8"  (1.727 m)   Wt 246 lb (111.6 kg)   BMI 37.40 kg/m  , BMI Body mass index is 37.4 kg/m. GEN: Well nourished, well developed, in no acute distress  HEENT: normal  Neck: no JVD, carotid bruits, or masses Cardiac: RRR; no murmurs, rubs, or gallops,no edema  Respiratory:  clear to auscultation bilaterally, normal work of breathing GI: soft, nontender, nondistended, + BS MS: no deformity or atrophy LifeVest in place.  Ecchymosis and mild edema noted at the right wrist catheter insertion site, nonpainful. Skin: warm and dry, no rash Neuro:  Strength and sensation are intact Psych: euthymic mood, full affect   EKG: Sinus rhythm with evidence of septal infarct, inferior and anterior lateral ischemia heart rate of 81 bpm  Recent Labs: 03/13/2017: Magnesium 1.9 03/15/2017: ALT 22 03/16/2017: BUN 23; Creatinine, Ser 1.64; Hemoglobin 8.8; Platelets 239; Potassium 3.8; Sodium 136  Lipid Panel    Component Value Date/Time   CHOL 204 (H) 03/10/2017 1227   TRIG 41 03/10/2017 1227   HDL 47 03/10/2017 1227   CHOLHDL 4.3 03/10/2017 1227   VLDL 8 03/10/2017 1227   LDLCALC 149 (H) 03/10/2017 1227      Wt Readings from Last 3 Encounters:  03/24/17 246 lb (111.6 kg)  03/16/17 238 lb 8 oz (108.2 kg)      Other studies Reviewed: Conclusion     Prox RCA to Mid RCA lesion is 80% stenosed.  Dist LAD lesion is 100% stenosed.  Prox LAD lesion is 100% stenosed.  Prox Cx to Mid Cx lesion is 50% stenosed.  Mid Cx lesion is 85% stenosed.  Dist Cx lesion is 90% stenosed.  3rd Mrg lesion is 90% stenosed.  A drug-eluting stent was successfully placed using a STENT SYNERGY DES 3X32.  Post intervention, there is a 0% residual stenosis.  LV end diastolic pressure is mildly elevated.  1. 3 vessel obstructive CAD - 100% mid LAD occlusion - this is the culprit lesion - severe disease in distal LCx/OM3. This area is too small for revascularization -  diffuse 80% proximal to mid RCA 2. Mildly elevated LVEDP 3. Successful PCI with stenting of the mid LAD with DES. Persistent total occlusion of the the distal LAD  Plan: DAPT for one year. IV Aggrastat for 18 hours. BP control with IV Ntg. Assess LV function with Echo. Would consider PCI of the RCA prior to DC home.    TTE: 03/11/17  Study Conclusions  - Left ventricle: The cavity size was normal. There was moderate concentric hypertrophy. Systolic function was moderately to severely reduced. The estimated ejection fraction was in the range of 30% to 35%. Features are consistent with a pseudonormal left ventricular filling pattern, with concomitant abnormal relaxation and increased filling pressure (grade 2 diastolic dysfunction). Doppler parameters are consistent with elevated ventricular end-diastolic filling pressure. - Aortic valve: Trileaflet; normal thickness leaflets. There was no regurgitation. - Mitral valve: There was mild regurgitation. - Right ventricle: The cavity size was normal. Wall thickness was normal. Systolic function was normal. - Tricuspid valve: There was mild regurgitation. - Pulmonary arteries: The main pulmonary artery was normal-sized. Systolic pressure was within the normal range. - Inferior vena cava: The vessel was normal in size. - Pericardium, extracardiac: There was no pericardial effusion.     ASSESSMENT AND PLAN:  1.  Coronary artery disease: Status post ST elevation MI.  Cardiac catheterization revealing 100% mid LAD requiring PCI with drug-eluting stent.  The patient has 90% right coronary artery stenosis which will require staged cardiac catheterization and intervention.  I have discussed this with Dr. Ellyn Hack, DOD on site today, who is in agreement to have this patient scheduled with Dr. [name] for staged intervention.  The patient will hold insulin prior to procedure, but will continue Brilinta and aspirin as  directed.  The patient understands that risks include but are not limited to stroke (1 in 1000), death (1 in 45), kidney failure [usually temporary] (1 in 500), bleeding (1 in 200), allergic reaction [possibly serious] (1 in 200), and agrees to proceed.   She is scheduled with Dr. [name] on January 18, at 10 AM.  Will likely need to use left radial artery access as she continues to have some edema and ecchymosis on the right wrist.  This will be decided by interventional cardiologist when seen that day.  2. HFrEF: Most recent echocardiogram and catheterization revealed  an ejection fraction of 20% to 25%.  She is currently not on diuretic therapy, ACE/ARB, or Entresto due to elevated creatinine during hospitalization.  I will increase her carvedilol to 25 mg twice daily as her blood pressures not currently well controlled on medication regimen.  This is been explained to her and she verbalizes understanding.  I have advised that she may feel a little more fatigued.  If she does she is to rest.  Hopefully be able to begin her on optimal medical therapy post catheterization which will include Entresto and diuretic.  The patient adamantly refuses to continue to wear LifeVest.  This is taken off of her on this office visit and she is to return it.  She is aware of the risks of removing the LifeVest and is willing to accept them.  Is also verbalized to me that if her heart stops she does not wish to be shocked.   3.  Acute renal insufficiency: The patient creatinine peaked at 2.05 during recent hospitalization dated 03/14/2017.  Most recent creatinine has improved to 1.6 on 03/16/2016.  Labs will be performed prior to cardiac catheterization for reevaluation of kidney function prior to procedure.  She is not currently on diuretics.  Current medicines are reviewed at length with the patient today.    Labs/ tests ordered today include: Pre-cardiac catheterization orders.  She is to have catheterization with  Dr. [name] on April 01, 2017.  Phill Myron. West Pugh, ANP, AACC   03/24/2017 12:25 PM    [facility] [address] [place], [place] [address] Phone: [phone]; Fax: [phone]

## 2017-03-24 NOTE — Patient Instructions (Signed)
Medication Instructions:  INCREASE CARVEDILOL 25MG  DAILY  If you need a refill on your cardiac medications before your next appointment, please call your pharmacy.  Labwork: CBC, PT/INR AND BMET TODAY HERE IN OUR OFFICE AT [facility]  Testing/Procedures: Your physician has requested that you have a cardiac catheterization. Cardiac catheterization is used to diagnose and/or treat various heart conditions. Doctors may recommend this procedure for a number of different reasons. The most common reason is to evaluate chest pain. Chest pain can be a symptom of coronary artery disease (CAD), and cardiac catheterization can show whether plaque is narrowing or blocking your heart's arteries. This procedure is also used to evaluate the valves, as well as measure the blood flow and oxygen levels in different parts of your heart. For further information please visit [email or web address]. Please follow instruction sheet, as given.  Special Instructions: YOU MAY SWITCH TO OU OFFICE CLOSE TO YOUR HOME THE DOCTORS THERE ARE DR [place], [place] DR [address]onson Ing.  OK TO STOP USING YOUR LIFE VEST.  Follow-Up: Your physician wants you to follow-up in: AFTER CATH.  Thank you for choosing [facility] at [facility]!!      [facility] [address] [place] [address] Dept: [phone] Loc: [place]  03/24/2017  You are scheduled for a Cardiac Catheterization on Friday, January 18 with Dr. Peter [name].  1. Please arrive at the [facility] (Main Entrance A) at [facility]: [address] [place], [place] [address] at 8:00 AM (two hours before your procedure to ensure your preparation). Free valet parking service is available.   Special note: Every effort is made to have your procedure done on time. Please understand that emergencies sometimes delay scheduled procedures.  2. Diet:  Do not eat or drink anything after midnight prior to your procedure except sips of water to take medications.  3. Labs: You will need to have blood drawn on Thursday, January 10 at [place]  Open: [place] (Lunch 12:30 - 1:30)   Phone: [phone]. You do not need to be fasting.  4. Medication instructions in preparation for your procedure:  HOLD INSULIN THE MORNING OF YOUR CATH  On the morning of your procedure, take your Aspirin, BRILINTA and any morning medicines NOT listed above.  You may use sips of water.  5. Plan for one night stay--bring personal belongings. 6. Bring a current list of your medications and current insurance cards. 7. You MUST have a responsible person to drive you home. 8. Someone MUST be with you the first 24 hours after you arrive home or your discharge will be delayed. 9. Please wear clothes that are easy to get on and off and wear slip-on shoes.  Thank you for allowing [name] to care for you!   -- [facility]

## 2017-03-24 NOTE — H&P (View-Only) (Signed)
Cardiology Office Note   Date:  03/24/2017   ID:  Stephanie, Manning [birth year], MRN [id number]  PCP:  Patient, No Pcp Per  Cardiologist:  Peter [name], MD Chief Complaint  Patient presents with  . Hospitalization Follow-up  . Coronary Artery Disease    Status post stent to LAD, with need for staged RCA intervention     History of Present Illness: Stephanie Manning is a 57 y.o. 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  The patient was transferred from [facility] in the setting of ST elevated MI with complaints of subscapular pain which began the evening before.  Patient also had elevated proBNP of 2215.  Evidence of pulmonary edema per chest x-ray.  Cardiac catheterization was completed emergently which revealed a 100% LAD occlusion, with successful PCI/DES x1 with persistent occlusion of the distal LAD.  The patient also was found to have severe disease in the distal circumflex, mild diffuse disease in the proximal to mid RCA.    Echocardiogram revealed an LVEF of 30% to 35% with grade 2 diastolic dysfunction.  Subsequently the patient was planned for a staged intervention to her right coronary artery , however, creatinine was elevated.    He was given IV hydration strted arted on carvedilol, ACE inhibitor was not begun due to renal insufficiency.  The patient troponin peaked at 17.88.  Hemoglobin A1c was elevated at 8.5.  On follow-up appointment she was to be scheduled for staged intervention after recovering from MI.  LifeVest was scheduled prior to discharge.  She comes today with continued generalized fatigue, but denies any chest pain or dizziness.  She is very unhappy with LifeVest stating it is  ill fitting, beats frequently, and is very uncomfortable.  She would like to have this removed.  She denies any bleeding hemoptysis or melena on dual antiplatelet therapy. She is medically compliant.  Past Medical History:  Diagnosis Date  . Acute ST elevation myocardial infarction (STEMI) involving left anterior descending (LAD) coronary artery ([place]) 03/10/2017   100% pLAD - PCI with Synergy DES 3 x 32.  Residual distal LAD occlusion, proximal RCA 80%, distal circumflex disease.  . Anemia   . Coronary artery disease involving native coronary artery of native heart with unstable angina pectoris (Stephanie Manning) 03/11/2017   Left Heart Cath/Coronary Angiography/PCI 03/10/2017: PCI pLAD 100% STENT SYNERGY DES; 1. 3 vessel obstructive CAD    - 100% mid LAD occlusion - this is the culprit lesion    - severe disease in distal LCx/OM3. This area is too small for revascularization    - diffuse 80% proximal to mid RCA 2. Mildly elevated LVEDP 3. Successful PCI with stenting of the mid LAD with DES. Persistent total occlusion  . Diabetes mellitus, type II, insulin dependent ([place])   . Hyperlipidemia   . Hypertension     Past Surgical History:  Procedure Laterality Date  . BILATERAL OOPHORECTOMY    . CESAREAN SECTION    . CORONARY/GRAFT ACUTE MI REVASCULARIZATION N/A 03/10/2017   Procedure: Coronary/Graft Acute MI Revascularization;  Surgeon: [name], [name], MD;  Location: [facility];  Service: Cardiovascular;  Laterality: N/A;  . LEFT HEART CATH AND CORONARY ANGIOGRAPHY N/A 03/10/2017   Procedure: LEFT HEART CATH AND CORONARY ANGIOGRAPHY;  Surgeon: [name], Peter  M, MD;  Location: [facility];  Service: Cardiovascular;  Laterality: N/A;     Current Outpatient Medications  Medication Sig Dispense Refill  . aspirin 81 MG chewable tablet Chew 1 tablet (81 mg total) by mouth daily.    Marland Kitchen atorvastatin (LIPITOR) 80 MG tablet Take 1 tablet (80 mg total) by mouth daily at 6 PM. 90 tablet 1  .  carvedilol (COREG) 25 MG tablet Take 1 tablet (25 mg total) by mouth 2 (two) times daily with a meal. 30 tablet 3  . ECHINACEA EXTRACT PO Take 400 mg by mouth daily.    Marland Kitchen gabapentin (NEURONTIN) 300 MG capsule Take 600 mg by mouth at bedtime.    . insulin aspart (NOVOLOG) 100 UNIT/ML injection Inject 10 Units into the skin 3 (three) times daily with meals.    . insulin detemir (LEVEMIR) 100 UNIT/ML injection Inject 50 Units into the skin 2 (two) times daily.    . Multiple Vitamin (MULTIVITAMIN WITH MINERALS) TABS tablet Take 1 tablet by mouth daily.    . nitroGLYCERIN (NITROSTAT) 0.4 MG SL tablet Place 1 tablet (0.4 mg total) under the tongue every 5 (five) minutes as needed for chest pain. 25 tablet 2  . pantoprazole (PROTONIX) 40 MG tablet Take 1 tablet (40 mg total) by mouth daily. 30 tablet 2  . potassium chloride SA (K-DUR,KLOR-CON) 20 MEQ tablet Take 1 tablet (20 mEq total) by mouth 2 (two) times daily. 60 tablet 1  . sodium chloride (OCEAN) 0.65 % SOLN nasal spray Place 1 spray into both nostrils as needed for congestion.    . ticagrelor (BRILINTA) 90 MG TABS tablet Take 1 tablet (90 mg total) by mouth 2 (two) times daily. 180 tablet 2  . tiZANidine (ZANAFLEX) 4 MG tablet Take 4 mg by mouth at bedtime as needed for muscle spasms.    . Vitamin D, Ergocalciferol, (DRISDOL) 50000 units CAPS capsule Take 50,000 Units by mouth every 7 (seven) days.     No current facility-administered medications for this visit.     Allergies:   Amoxicillin; Hydrocodone; Lisinopril; Meloxicam; Strawberry extract; and Tape    Social History:  The patient  reports that she has quit smoking. she has never used smokeless tobacco.   Family History:  The patient's family history includes Diabetes in her mother; Heart disease in her mother; Hypertension in her mother; Stroke in her mother.    ROS: All other systems are reviewed and negative. Unless otherwise mentioned in H&P    PHYSICAL EXAM: VS:  BP (!)  150/82   Pulse 81   Ht 5\' 8"  (1.727 m)   Wt 246 lb (111.6 kg)   BMI 37.40 kg/m  , BMI Body mass index is 37.4 kg/m. GEN: Well nourished, well developed, in no acute distress  HEENT: normal  Neck: no JVD, carotid bruits, or masses Cardiac: RRR; no murmurs, rubs, or gallops,no edema  Respiratory:  clear to auscultation bilaterally, normal work of breathing GI: soft, nontender, nondistended, + BS MS: no deformity or atrophy LifeVest in place.  Ecchymosis and mild edema noted at the right wrist catheter insertion site, nonpainful. Skin: warm and dry, no rash Neuro:  Strength and sensation are intact Psych: euthymic mood, full affect   EKG: Sinus rhythm with evidence of septal infarct, inferior and anterior lateral ischemia heart rate of 81 bpm  Recent Labs: 03/13/2017: Magnesium 1.9 03/15/2017: ALT 22 03/16/2017: BUN 23; Creatinine, Ser 1.64; Hemoglobin 8.8; Platelets 239; Potassium 3.8; Sodium 136  Lipid Panel    Component Value Date/Time   CHOL 204 (H) 03/10/2017 1227   TRIG 41 03/10/2017 1227   HDL 47 03/10/2017 1227   CHOLHDL 4.3 03/10/2017 1227   VLDL 8 03/10/2017 1227   LDLCALC 149 (H) 03/10/2017 1227      Wt Readings from Last 3 Encounters:  03/24/17 246 lb (111.6 kg)  03/16/17 238 lb 8 oz (108.2 kg)      Other studies Reviewed: Conclusion     Prox RCA to Mid RCA lesion is 80% stenosed.  Dist LAD lesion is 100% stenosed.  Prox LAD lesion is 100% stenosed.  Prox Cx to Mid Cx lesion is 50% stenosed.  Mid Cx lesion is 85% stenosed.  Dist Cx lesion is 90% stenosed.  3rd Mrg lesion is 90% stenosed.  A drug-eluting stent was successfully placed using a STENT SYNERGY DES 3X32.  Post intervention, there is a 0% residual stenosis.  LV end diastolic pressure is mildly elevated.  1. 3 vessel obstructive CAD - 100% mid LAD occlusion - this is the culprit lesion - severe disease in distal LCx/OM3. This area is too small for revascularization -  diffuse 80% proximal to mid RCA 2. Mildly elevated LVEDP 3. Successful PCI with stenting of the mid LAD with DES. Persistent total occlusion of the the distal LAD  Plan: DAPT for one year. IV Aggrastat for 18 hours. BP control with IV Ntg. Assess LV function with Echo. Would consider PCI of the RCA prior to DC home.    TTE: 03/11/17  Study Conclusions  - Left ventricle: The cavity size was normal. There was moderate concentric hypertrophy. Systolic function was moderately to severely reduced. The estimated ejection fraction was in the range of 30% to 35%. Features are consistent with a pseudonormal left ventricular filling pattern, with concomitant abnormal relaxation and increased filling pressure (grade 2 diastolic dysfunction). Doppler parameters are consistent with elevated ventricular end-diastolic filling pressure. - Aortic valve: Trileaflet; normal thickness leaflets. There was no regurgitation. - Mitral valve: There was mild regurgitation. - Right ventricle: The cavity size was normal. Wall thickness was normal. Systolic function was normal. - Tricuspid valve: There was mild regurgitation. - Pulmonary arteries: The main pulmonary artery was normal-sized. Systolic pressure was within the normal range. - Inferior vena cava: The vessel was normal in size. - Pericardium, extracardiac: There was no pericardial effusion.     ASSESSMENT AND PLAN:  1.  Coronary artery disease: Status post ST elevation MI.  Cardiac catheterization revealing 100% mid LAD requiring PCI with drug-eluting stent.  The patient has 90% right coronary artery stenosis which will require staged cardiac catheterization and intervention.  I have discussed this with Dr. Ellyn Hack, DOD on site today, who is in agreement to have this patient scheduled with Dr. [name] for staged intervention.  The patient will hold insulin prior to procedure, but will continue Brilinta and aspirin as  directed.  The patient understands that risks include but are not limited to stroke (1 in 1000), death (1 in 51), kidney failure [usually temporary] (1 in 500), bleeding (1 in 200), allergic reaction [possibly serious] (1 in 200), and agrees to proceed.   She is scheduled with Dr. [name] on January 18, at 10 AM.  Will likely need to use left radial artery access as she continues to have some edema and ecchymosis on the right wrist.  This will be decided by interventional cardiologist when seen that day.  2. HFrEF: Most recent echocardiogram and catheterization revealed  an ejection fraction of 20% to 25%.  She is currently not on diuretic therapy, ACE/ARB, or Entresto due to elevated creatinine during hospitalization.  I will increase her carvedilol to 25 mg twice daily as her blood pressures not currently well controlled on medication regimen.  This is been explained to her and she verbalizes understanding.  I have advised that she may feel a little more fatigued.  If she does she is to rest.  Hopefully be able to begin her on optimal medical therapy post catheterization which will include Entresto and diuretic.  The patient adamantly refuses to continue to wear LifeVest.  This is taken off of her on this office visit and she is to return it.  She is aware of the risks of removing the LifeVest and is willing to accept them.  Is also verbalized to me that if her heart stops she does not wish to be shocked.   3.  Acute renal insufficiency: The patient creatinine peaked at 2.05 during recent hospitalization dated 03/14/2017.  Most recent creatinine has improved to 1.6 on 03/16/2016.  Labs will be performed prior to cardiac catheterization for reevaluation of kidney function prior to procedure.  She is not currently on diuretics.  Current medicines are reviewed at length with the patient today.    Labs/ tests ordered today include: Pre-cardiac catheterization orders.  She is to have catheterization with  Dr. [name] on April 01, 2017.  Phill Myron. West Pugh, ANP, AACC   03/24/2017 12:25 PM    [facility] [address] [place], [place] [address] Phone: [phone]; Fax: [phone]

## 2017-03-25 LAB — BASIC METABOLIC PANEL
BUN/Creatinine Ratio: 10 (ref 9–23)
BUN: 14 mg/dL (ref 6–24)
CHLORIDE: 107 mmol/L — AB (ref 96–106)
CO2: 19 mmol/L — AB (ref 20–29)
Calcium: 9.3 mg/dL (ref 8.7–10.2)
Creatinine, Ser: 1.47 mg/dL — ABNORMAL HIGH (ref 0.57–1.00)
GFR calc Af Amer: 45 mL/min/{1.73_m2} — ABNORMAL LOW (ref >59)
GFR, EST NON AFRICAN AMERICAN: 39 mL/min/{1.73_m2} — AB (ref >59)
GLUCOSE: 219 mg/dL — AB (ref 65–99)
POTASSIUM: 5.3 mmol/L — AB (ref 3.5–5.2)
SODIUM: 139 mmol/L (ref 134–144)

## 2017-03-25 LAB — PROTIME-INR
INR: 1 (ref 0.8–1.2)
PROTHROMBIN TIME: 10.7 s (ref 9.1–12.0)

## 2017-03-25 LAB — CBC
Hematocrit: 31.8 % — ABNORMAL LOW (ref 34.0–46.6)
Hemoglobin: 9.6 g/dL — ABNORMAL LOW (ref 11.1–15.9)
MCH: 26 pg — ABNORMAL LOW (ref 26.6–33.0)
MCHC: 30.2 g/dL — ABNORMAL LOW (ref 31.5–35.7)
MCV: 86 fL (ref 79–97)
Platelets: 347 10*3/uL (ref 150–379)
RBC: 3.69 x10E6/uL — AB (ref 3.77–5.28)
RDW: 14.8 % (ref 12.3–15.4)
WBC: 9.5 10*3/uL (ref 3.4–10.8)

## 2017-03-30 ENCOUNTER — Encounter (HOSPITAL_COMMUNITY): Payer: Self-pay | Admitting: *Deleted

## 2017-03-30 ENCOUNTER — Other Ambulatory Visit: Payer: Self-pay

## 2017-03-30 ENCOUNTER — Telehealth: Payer: Self-pay | Admitting: Adult Health

## 2017-03-30 ENCOUNTER — Emergency Department (HOSPITAL_COMMUNITY)
Admission: EM | Admit: 2017-03-30 | Discharge: 2017-03-30 | Disposition: A | Discharge status: Home / Self Care | Payer: Medicare Other | Source: Home / Self Care | Attending: Emergency Medicine | Admitting: Emergency Medicine

## 2017-03-30 ENCOUNTER — Emergency Department (HOSPITAL_COMMUNITY): Payer: Medicare Other

## 2017-03-30 DIAGNOSIS — Z87891 Personal history of nicotine dependence: Secondary | ICD-10-CM | POA: Insufficient documentation

## 2017-03-30 DIAGNOSIS — I25119 Atherosclerotic heart disease of native coronary artery with unspecified angina pectoris: Secondary | ICD-10-CM | POA: Diagnosis not present

## 2017-03-30 DIAGNOSIS — I2510 Atherosclerotic heart disease of native coronary artery without angina pectoris: Secondary | ICD-10-CM | POA: Insufficient documentation

## 2017-03-30 DIAGNOSIS — R0609 Other forms of dyspnea: Secondary | ICD-10-CM | POA: Insufficient documentation

## 2017-03-30 DIAGNOSIS — I5041 Acute combined systolic (congestive) and diastolic (congestive) heart failure: Secondary | ICD-10-CM

## 2017-03-30 DIAGNOSIS — I10 Essential (primary) hypertension: Secondary | ICD-10-CM | POA: Insufficient documentation

## 2017-03-30 DIAGNOSIS — E119 Type 2 diabetes mellitus without complications: Secondary | ICD-10-CM | POA: Insufficient documentation

## 2017-03-30 LAB — BRAIN NATRIURETIC PEPTIDE: B Natriuretic Peptide: 462.4 pg/mL — ABNORMAL HIGH (ref 0.0–100.0)

## 2017-03-30 LAB — BASIC METABOLIC PANEL
ANION GAP: 10 (ref 5–15)
BUN: 14 mg/dL (ref 6–20)
CHLORIDE: 113 mmol/L — AB (ref 101–111)
CO2: 19 mmol/L — ABNORMAL LOW (ref 22–32)
Calcium: 9.1 mg/dL (ref 8.9–10.3)
Creatinine, Ser: 1.38 mg/dL — ABNORMAL HIGH (ref 0.44–1.00)
GFR calc non Af Amer: 42 mL/min — ABNORMAL LOW (ref >60)
GFR, EST AFRICAN AMERICAN: 48 mL/min — AB (ref >60)
Glucose, Bld: 116 mg/dL — ABNORMAL HIGH (ref 65–99)
Potassium: 4.3 mmol/L (ref 3.5–5.1)
Sodium: 142 mmol/L (ref 135–145)

## 2017-03-30 LAB — I-STAT TROPONIN, ED: TROPONIN I, POC: 0 ng/mL (ref 0.00–0.08)

## 2017-03-30 LAB — CBC
HEMATOCRIT: 29 % — AB (ref 36.0–46.0)
HEMOGLOBIN: 9.1 g/dL — AB (ref 12.0–15.0)
MCH: 26.7 pg (ref 26.0–34.0)
MCHC: 31.4 g/dL (ref 30.0–36.0)
MCV: 85 fL (ref 78.0–100.0)
Platelets: 248 10*3/uL (ref 150–400)
RBC: 3.41 MIL/uL — AB (ref 3.87–5.11)
RDW: 14.9 % (ref 11.5–15.5)
WBC: 7.4 10*3/uL (ref 4.0–10.5)

## 2017-03-30 LAB — TROPONIN I

## 2017-03-30 NOTE — ED Notes (Signed)
Pt was called for by both this tech and x-ray tech at 16:10 in waiting room, triage rooms, as well as hallways in pod A. No reply.

## 2017-03-30 NOTE — Telephone Encounter (Signed)
New message   Patient scheduled for stent on 04/01/17  Pt c/o Shortness Of Breath: STAT if SOB developed within the last 24 hours or pt is noticeably SOB on the phone  1. Are you currently SOB (can you hear that pt is SOB on the phone)? Yes  2. How long have you been experiencing SOB? 3 days  3. Are you SOB when sitting or when up moving around? Sitting and moving  4. Are you currently experiencing any other symptoms? Lightheaded, coughing

## 2017-03-30 NOTE — ED Notes (Signed)
Lab work, radiology results and vital signs reviewed, no critical results at this time, no change in acuity indicated. Staff have been unable to locate pt for radiology, will attempt again.

## 2017-03-30 NOTE — ED Triage Notes (Signed)
Pt reports recent MI and was dc on 1/2. Having productive cough since being discharged and progressively getting more sob. Denies swelling. No acute distress is noted on arrival and ekg done.

## 2017-03-30 NOTE — ED Provider Notes (Signed)
Emergency Department Provider Note   I have reviewed the triage vital signs and the nursing notes.   HISTORY  Chief Complaint Shortness of Breath   HPI Stephanie Manning is a 57 y.o. female with recent admission for STEMI requiring DES placement of 100% mid LAD lesion and also noted to have 80% RCA, DM, HTN, and HLD presents to the ED for evaluation of worsening exertional dyspnea.  Patient states that she has been doing well since discharge and has remained compliant with her medications.  She reports that since discharge she has been progressively more short of breath.  Symptoms are primarily with exertion and improved with rest.  She denies associated chest tightness or other pain.  She notes occasional tingling in the fingers of the left hand but that is intermittent and does not seem to correlate with her shortness of breath.  She has been weighing herself every day and has not gained significant amount of weight.  Denies lower extremity swelling.  No fevers, chills, productive cough, hemoptysis.  Past Medical History:  Diagnosis Date  . Acute ST elevation myocardial infarction (STEMI) involving left anterior descending (LAD) coronary artery ([place]) 03/10/2017   100% pLAD - PCI with Synergy DES 3 x 32.  Residual distal LAD occlusion, proximal RCA 80%, distal circumflex disease.  . Anemia   . Coronary artery disease involving native coronary artery of native heart with unstable angina pectoris ([place]) 03/11/2017   Left Heart Cath/Coronary Angiography/PCI 03/10/2017: PCI pLAD 100% STENT SYNERGY DES; 1. 3 vessel obstructive CAD    - 100% mid LAD occlusion - this is the culprit lesion    - severe disease in distal LCx/OM3. This area is too small for revascularization    - diffuse 80% proximal to mid RCA 2. Mildly elevated LVEDP 3. Successful PCI with stenting of the mid LAD with DES. Persistent total occlusion  . Diabetes mellitus, type II, insulin dependent ([place])   . Hyperlipidemia   .  Hypertension     Patient Active Problem List   Diagnosis Date Noted  . At risk for sudden cardiac death 04-13-17  . Acute renal failure d/t procedure 03/14/2017  . Ischemic cardiomyopathy 03/14/2017  . Acute combined systolic and diastolic heart failure ([place]) 03/14/2017  . Coronary artery disease involving native coronary artery of native heart with unstable angina pectoris ([place]) 03/11/2017  . Persistent cough 03/11/2017  . Nausea 03/11/2017  . Acute ST elevation myocardial infarction (STEMI) involving left anterior descending (LAD) coronary artery ([place]) 03/10/2017  . Type 2 diabetes mellitus with complication, with long-term current use of insulin ([place]) 03/10/2017  . Essential hypertension 03/10/2017  . Hyperlipidemia with target low density lipoprotein (LDL) cholesterol less than 70 mg/dL 03/10/2017  . Chronic anemia 03/10/2017    Past Surgical History:  Procedure Laterality Date  . BILATERAL OOPHORECTOMY    . CESAREAN SECTION    . CORONARY/GRAFT ACUTE MI REVASCULARIZATION N/A 03/10/2017   Procedure: Coronary/Graft Acute MI Revascularization;  Surgeon: [name], [name], MD;  Location: [facility];  Service: Cardiovascular;  Laterality: N/A;  . LEFT HEART CATH AND CORONARY ANGIOGRAPHY N/A 03/10/2017   Procedure: LEFT HEART CATH AND CORONARY ANGIOGRAPHY;  Surgeon: [name], [name], MD;  Location: [facility];  Service: Cardiovascular;  Laterality: N/A;    Current Outpatient Rx  . Order #: [id number] Class: OTC  . Order #: [id number] Class: Normal  . Order #: [id number] Class: Normal  . Order #: [id number] Class: Historical Med  . Order #: [id number] Class:  Historical Med  . Order #: [id number] Class: Historical Med  . Order #: [id number] Class: Historical Med  . Order #: [id number] Class: Historical Med  . Order #: [id number] Class: Historical Med  . Order #: [id number] Class: Historical Med  . Order #: [id number] Class: Normal  . Order #: [id number] Class: Normal  . Order #:  [id number] Class: Normal  . Order #: [id number] Class: Historical Med  . Order #: [id number] Class: Normal  . Order #: [id number] Class: Historical Med    Allergies Amoxicillin; Hydrocodone; Lisinopril; Meloxicam; Strawberry extract; and Tape  Family History  Problem Relation Age of Onset  . Hypertension Mother   . Heart disease Mother   . Diabetes Mother   . Stroke Mother     Social History Social History   Tobacco Use  . Smoking status: Former Research scientist (life sciences)  . Smokeless tobacco: Never Used  Substance Use Topics  . Alcohol use: Not on file  . Drug use: Not on file    Review of Systems  Constitutional: No fever/chills Eyes: No visual changes. ENT: No sore throat. Cardiovascular: Denies chest pain. Respiratory: Positive exertional shortness of breath. Gastrointestinal: No abdominal pain.  No nausea, no vomiting.  No diarrhea.  No constipation. Genitourinary: Negative for dysuria. Musculoskeletal: Negative for back pain. Skin: Negative for rash. Neurological: Negative for headaches, focal weakness or numbness.  10-point ROS otherwise negative.  ____________________________________________   PHYSICAL EXAM:  VITAL SIGNS: ED Triage Vitals  Enc Vitals Group     BP 03/30/17 1456 (!) 142/83     Pulse Rate 03/30/17 1456 71     Resp 03/30/17 1456 18     Temp 03/30/17 1456 98.5 F (36.9 C)     Temp Source 03/30/17 1456 Oral     SpO2 03/30/17 1456 100 %     Pain Score 03/30/17 1455 0   Constitutional: Alert and oriented. Well appearing and in no acute distress. Eyes: Conjunctivae are normal. Head: Atraumatic. Nose: No congestion/rhinnorhea. Mouth/Throat: Mucous membranes are moist.  Oropharynx non-erythematous. Neck: No stridor. Cardiovascular: Normal rate, regular rhythm. Good peripheral circulation. Grossly normal heart sounds.   Respiratory: Normal respiratory effort.  No retractions. Lungs CTAB. Gastrointestinal: Soft and nontender. No distention.  Musculoskeletal: No  lower extremity tenderness with trace B/L LE edema. No gross deformities of extremities. Neurologic:  Normal speech and language. No gross focal neurologic deficits are appreciated.  Skin:  Skin is warm, dry and intact. No rash noted.   ____________________________________________   LABS (all labs ordered are listed, but only abnormal results are displayed)  Labs Reviewed  BASIC METABOLIC PANEL - Abnormal; Notable for the following components:      Result Value   Chloride 113 (*)    CO2 19 (*)    Glucose, Bld 116 (*)    Creatinine, Ser 1.38 (*)    GFR calc non Af Amer 42 (*)    GFR calc Af Amer 48 (*)    All other components within normal limits  CBC - Abnormal; Notable for the following components:   RBC 3.41 (*)    Hemoglobin 9.1 (*)    HCT 29.0 (*)    All other components within normal limits  BRAIN NATRIURETIC PEPTIDE - Abnormal; Notable for the following components:   B Natriuretic Peptide 462.4 (*)    All other components within normal limits  TROPONIN I  I-STAT TROPONIN, ED   ____________________________________________  EKG   EKG Interpretation  Date/Time:  Wednesday March 30 2017 14:50:59 EST Ventricular Rate:  70 PR Interval:  146 QRS Duration:  82 QT Interval:  428 QTC Calculation: 462 R Axis:   2 Text Interpretation:  Normal sinus rhythm Anterior infarct , age undetermined T wave abnormality, consider inferolateral ischemia Abnormal ECG No STEMI. Similar to discharge EKG.  Confirmed by Nanda Quinton [phone]) on 03/30/2017 8:02:42 PM       ____________________________________________  RADIOLOGY  Dg Chest 2 View  Result Date: 03/30/2017 CLINICAL DATA:  Productive cough and worsening shortness of breath for 2 weeks. Recent myocardial infarction. EXAM: CHEST  2 VIEW COMPARISON:  03/11/2017 FINDINGS: The heart size and mediastinal contours are within normal limits. Both lungs are clear. The visualized skeletal structures are unremarkable. IMPRESSION: No  active cardiopulmonary disease. Electronically Signed   By: Earle Gell M.D.   On: 03/30/2017 19:36    ____________________________________________   PROCEDURES  Procedure(s) performed:   Procedures  None ____________________________________________   INITIAL IMPRESSION / ASSESSMENT AND PLAN / ED COURSE  Pertinent labs & imaging results that were available during my care of the patient were reviewed by me and considered in my medical decision making (see chart for details).  Patient with recent admission for STEMI requiring drug-eluting stent placement in the LAD and known 80% RCA stenosis presents with exertional dyspnea.  No chest pain.  No nausea or other anginal equivalent.  Symptoms only with exertion.  Patient does not seem volume overloaded.  Chest x-ray not consistent with pneumonia or fluid overload.  Plan for repeat troponin along with BNP. Reassess after ambulation with pulse ox.   11:16 PM  Spoke with Cardiology regarding the patient's presentation.  The patient is having a staged intervention with cardiology in 2 days.  Patient was ambulatory here in the emergency department without hypoxemia or shortness of breath.  She is not showing signs of volume overload clinically.  Chest x-ray clear.  Repeat troponin clear.  Patient and cardiology team comfortable with discharge at this time with very strict return precautions discussed.   At this time, I do not feel there is any life-threatening condition present. I have reviewed and discussed all results (EKG, imaging, lab, urine as appropriate), exam findings with patient. I have reviewed nursing notes and appropriate previous records.  I feel the patient is safe to be discharged home without further emergent workup. Discussed usual and customary return precautions. Patient and family (if present) verbalize understanding and are comfortable with this plan.  Patient will follow-up with their primary care provider. If they do not have  a primary care provider, information for follow-up has been provided to them. All questions have been answered.  ____________________________________________  FINAL CLINICAL IMPRESSION(S) / ED DIAGNOSES  Final diagnoses:  Dyspnea on exertion    Note:  This document was prepared using Dragon voice recognition software and may include unintentional dictation errors.  Nanda Quinton, MD Emergency Medicine    Long, [facility], MD 03/31/17 0000

## 2017-03-30 NOTE — Discharge Instructions (Signed)
You were seen in the ED with difficulty breathing with walking. Your labs and x-rays are normal here. I spoke with the Cardiology team and they will see you on Friday for your planned procedure. Return to the ED sooner if you develop ANY chest pain or worsening shortness of breath.

## 2017-03-30 NOTE — ED Notes (Signed)
Pt ambulated in hall and to restroom. Pt stated she felt "short of breath some". O2 sat maintained at 100%. Pt denied dizziness. Stable gait noted. Rn notified.

## 2017-03-30 NOTE — Telephone Encounter (Signed)
The patient called in with worsening shortness of breath and feelings of light headedness. She stated that the shortness of breath comes with activity and at rest. The patient was audibly short of breath. She was recently discharged from the hospital after having a cardiac cath and stent.   Status post ST elevation MI.  Cardiac catheterization revealing 100% mid LAD requiring PCI with drug-eluting stent.  The patient has 90% right coronary artery stenosis which will require staged cardiac catheterization and intervention  The patient is scheduled for a cardiac catheterization on Friday. She has been advised to go to the nearest ED. An offer was made to call 911 for her but she declined stating that she would get someone to take her.

## 2017-04-01 ENCOUNTER — Other Ambulatory Visit: Payer: Self-pay

## 2017-04-01 ENCOUNTER — Encounter (HOSPITAL_COMMUNITY): Payer: Self-pay | Admitting: General Practice

## 2017-04-01 ENCOUNTER — Encounter (HOSPITAL_COMMUNITY)
Admission: RE | Disposition: A | Discharge status: Home / Self Care | Payer: Self-pay | Source: Ambulatory Visit | Attending: Cardiology

## 2017-04-01 ENCOUNTER — Inpatient Hospital Stay (HOSPITAL_COMMUNITY)
Admission: RE | Admit: 2017-04-01 | Discharge: 2017-04-03 | DRG: 246 | Disposition: A | Discharge status: Home / Self Care | Payer: Medicare Other | Source: Ambulatory Visit | Attending: Cardiology | Admitting: Cardiology

## 2017-04-01 DIAGNOSIS — I5041 Acute combined systolic (congestive) and diastolic (congestive) heart failure: Secondary | ICD-10-CM | POA: Diagnosis present

## 2017-04-01 DIAGNOSIS — I209 Angina pectoris, unspecified: Secondary | ICD-10-CM | POA: Diagnosis present

## 2017-04-01 DIAGNOSIS — Z7982 Long term (current) use of aspirin: Secondary | ICD-10-CM

## 2017-04-01 DIAGNOSIS — Z880 Allergy status to penicillin: Secondary | ICD-10-CM

## 2017-04-01 DIAGNOSIS — Z885 Allergy status to narcotic agent status: Secondary | ICD-10-CM

## 2017-04-01 DIAGNOSIS — Z91018 Allergy to other foods: Secondary | ICD-10-CM

## 2017-04-01 DIAGNOSIS — I25119 Atherosclerotic heart disease of native coronary artery with unspecified angina pectoris: Principal | ICD-10-CM

## 2017-04-01 DIAGNOSIS — I2510 Atherosclerotic heart disease of native coronary artery without angina pectoris: Secondary | ICD-10-CM

## 2017-04-01 DIAGNOSIS — E785 Hyperlipidemia, unspecified: Secondary | ICD-10-CM | POA: Diagnosis present

## 2017-04-01 DIAGNOSIS — E782 Mixed hyperlipidemia: Secondary | ICD-10-CM | POA: Diagnosis present

## 2017-04-01 DIAGNOSIS — D649 Anemia, unspecified: Secondary | ICD-10-CM | POA: Diagnosis present

## 2017-04-01 DIAGNOSIS — Z87891 Personal history of nicotine dependence: Secondary | ICD-10-CM

## 2017-04-01 DIAGNOSIS — I5023 Acute on chronic systolic (congestive) heart failure: Secondary | ICD-10-CM | POA: Diagnosis present

## 2017-04-01 DIAGNOSIS — I130 Hypertensive heart and chronic kidney disease with heart failure and stage 1 through stage 4 chronic kidney disease, or unspecified chronic kidney disease: Secondary | ICD-10-CM | POA: Diagnosis present

## 2017-04-01 DIAGNOSIS — K5900 Constipation, unspecified: Secondary | ICD-10-CM | POA: Diagnosis present

## 2017-04-01 DIAGNOSIS — E119 Type 2 diabetes mellitus without complications: Secondary | ICD-10-CM

## 2017-04-01 DIAGNOSIS — E118 Type 2 diabetes mellitus with unspecified complications: Secondary | ICD-10-CM

## 2017-04-01 DIAGNOSIS — N184 Chronic kidney disease, stage 4 (severe): Secondary | ICD-10-CM

## 2017-04-01 DIAGNOSIS — I255 Ischemic cardiomyopathy: Secondary | ICD-10-CM | POA: Diagnosis present

## 2017-04-01 DIAGNOSIS — I10 Essential (primary) hypertension: Secondary | ICD-10-CM | POA: Diagnosis present

## 2017-04-01 DIAGNOSIS — E876 Hypokalemia: Secondary | ICD-10-CM | POA: Diagnosis present

## 2017-04-01 DIAGNOSIS — Z888 Allergy status to other drugs, medicaments and biological substances status: Secondary | ICD-10-CM

## 2017-04-01 DIAGNOSIS — N183 Chronic kidney disease, stage 3 (moderate): Secondary | ICD-10-CM | POA: Diagnosis present

## 2017-04-01 DIAGNOSIS — I213 ST elevation (STEMI) myocardial infarction of unspecified site: Secondary | ICD-10-CM | POA: Diagnosis present

## 2017-04-01 DIAGNOSIS — E1159 Type 2 diabetes mellitus with other circulatory complications: Secondary | ICD-10-CM

## 2017-04-01 DIAGNOSIS — E1122 Type 2 diabetes mellitus with diabetic chronic kidney disease: Secondary | ICD-10-CM | POA: Diagnosis present

## 2017-04-01 DIAGNOSIS — Z794 Long term (current) use of insulin: Secondary | ICD-10-CM

## 2017-04-01 DIAGNOSIS — D61818 Other pancytopenia: Secondary | ICD-10-CM | POA: Diagnosis present

## 2017-04-01 DIAGNOSIS — Z955 Presence of coronary angioplasty implant and graft: Secondary | ICD-10-CM

## 2017-04-01 DIAGNOSIS — Z79899 Other long term (current) drug therapy: Secondary | ICD-10-CM

## 2017-04-01 DIAGNOSIS — I5043 Acute on chronic combined systolic (congestive) and diastolic (congestive) heart failure: Secondary | ICD-10-CM | POA: Diagnosis present

## 2017-04-01 DIAGNOSIS — E11649 Type 2 diabetes mellitus with hypoglycemia without coma: Secondary | ICD-10-CM | POA: Diagnosis present

## 2017-04-01 HISTORY — DX: Atherosclerotic heart disease of native coronary artery without angina pectoris: I25.10

## 2017-04-01 HISTORY — PX: CORONARY STENT INTERVENTION: CATH118234

## 2017-04-01 HISTORY — DX: Chronic combined systolic (congestive) and diastolic (congestive) heart failure: I50.42

## 2017-04-01 HISTORY — DX: Acute kidney failure, unspecified: N17.9

## 2017-04-01 HISTORY — PX: CORONARY ANGIOPLASTY WITH STENT PLACEMENT: SHX49

## 2017-04-01 HISTORY — PX: LEFT HEART CATH AND CORONARY ANGIOGRAPHY: CATH118249

## 2017-04-01 HISTORY — DX: Ischemic cardiomyopathy: I25.5

## 2017-04-01 LAB — GLUCOSE, CAPILLARY
GLUCOSE-CAPILLARY: 72 mg/dL (ref 65–99)
Glucose-Capillary: 73 mg/dL (ref 65–99)
Glucose-Capillary: 72 mg/dL (ref 65–99)
Glucose-Capillary: 65 mg/dL (ref 65–99)
Glucose-Capillary: 174 mg/dL — ABNORMAL HIGH (ref 65–99)
Glucose-Capillary: 146 mg/dL — ABNORMAL HIGH (ref 65–99)

## 2017-04-01 LAB — POCT ACTIVATED CLOTTING TIME: Activated Clotting Time: 478 seconds

## 2017-04-01 SURGERY — CORONARY STENT INTERVENTION
Anesthesia: LOCAL

## 2017-04-01 MED ORDER — ONDANSETRON HCL 4 MG/2ML IJ SOLN
4.0000 mg | Freq: Four times a day (QID) | INTRAMUSCULAR | Status: DC | PRN
  Administered 2017-04-01: 4 mg via INTRAVENOUS
  Filled 2017-04-01: qty 2

## 2017-04-01 MED ORDER — NITROGLYCERIN 1 MG/10 ML FOR IR/CATH LAB
INTRA_ARTERIAL | Status: AC
  Filled 2017-04-01: qty 10

## 2017-04-01 MED ORDER — MIDAZOLAM HCL 2 MG/2ML IJ SOLN
INTRAMUSCULAR | Status: AC
  Filled 2017-04-01: qty 2

## 2017-04-01 MED ORDER — SODIUM CHLORIDE 0.9 % IV SOLN: 250.0000 mL | INTRAVENOUS | Status: DC | PRN

## 2017-04-01 MED ORDER — HEPARIN (PORCINE) IN NACL 2-0.9 UNIT/ML-% IJ SOLN
INTRAMUSCULAR | Status: DC | PRN
  Administered 2017-04-01: 10 mL via INTRA_ARTERIAL

## 2017-04-01 MED ORDER — LIDOCAINE HCL (PF) 1 % IJ SOLN
INTRAMUSCULAR | Status: DC | PRN
Start: 2017-04-01 — End: 2017-04-01
  Administered 2017-04-01: 2 mL

## 2017-04-01 MED ORDER — ACETAMINOPHEN 325 MG PO TABS
650.0000 mg | ORAL_TABLET | ORAL | Status: DC | PRN
  Administered 2017-04-01 – 2017-04-02 (×2): 650 mg via ORAL
  Filled 2017-04-01 (×2): qty 2

## 2017-04-01 MED ORDER — POTASSIUM CHLORIDE CRYS ER 20 MEQ PO TBCR
20.0000 meq | EXTENDED_RELEASE_TABLET | Freq: Two times a day (BID) | ORAL | Status: DC
  Administered 2017-04-02 – 2017-04-03 (×3): 20 meq via ORAL
  Filled 2017-04-01 (×5): qty 1

## 2017-04-01 MED ORDER — ANGIOPLASTY BOOK
Freq: Once | Status: AC
  Administered 2017-04-01: 23:00:00 1
  Filled 2017-04-01: qty 1

## 2017-04-01 MED ORDER — NITROGLYCERIN 1 MG/10 ML FOR IR/CATH LAB
INTRA_ARTERIAL | Status: DC | PRN
  Administered 2017-04-01: 200 ug via INTRACORONARY
  Administered 2017-04-01: 90 mL via INTRA_ARTERIAL

## 2017-04-01 MED ORDER — HEPARIN (PORCINE) IN NACL 2-0.9 UNIT/ML-% IJ SOLN
INTRAMUSCULAR | Status: AC
  Filled 2017-04-01: qty 1000

## 2017-04-01 MED ORDER — HEPARIN SODIUM (PORCINE) 1000 UNIT/ML IJ SOLN
INTRAMUSCULAR | Status: DC | PRN
  Administered 2017-04-01: 10000 [IU] via INTRAVENOUS

## 2017-04-01 MED ORDER — FENTANYL CITRATE (PF) 100 MCG/2ML IJ SOLN
INTRAMUSCULAR | Status: DC | PRN
  Administered 2017-04-01: 25 ug via INTRAVENOUS

## 2017-04-01 MED ORDER — CARVEDILOL 25 MG PO TABS
25.0000 mg | ORAL_TABLET | Freq: Two times a day (BID) | ORAL | Status: DC
  Administered 2017-04-01 – 2017-04-03 (×5): 25 mg via ORAL
  Filled 2017-04-01: qty 2
  Filled 2017-04-01 (×3): qty 1
  Filled 2017-04-01 (×2): qty 2

## 2017-04-01 MED ORDER — SODIUM CHLORIDE 0.9% FLUSH: 3.0000 mL | INTRAVENOUS | Status: DC | PRN

## 2017-04-01 MED ORDER — ADULT MULTIVITAMIN W/MINERALS CH
1.0000 | ORAL_TABLET | Freq: Every day | ORAL | Status: DC
  Administered 2017-04-01 – 2017-04-03 (×3): 1 via ORAL
  Filled 2017-04-01 (×3): qty 1

## 2017-04-01 MED ORDER — NITROGLYCERIN 0.4 MG SL SUBL: 0.4000 mg | SUBLINGUAL_TABLET | SUBLINGUAL | Status: DC | PRN

## 2017-04-01 MED ORDER — LIDOCAINE HCL (PF) 1 % IJ SOLN
INTRAMUSCULAR | Status: AC
  Filled 2017-04-01: qty 30

## 2017-04-01 MED ORDER — SODIUM CHLORIDE 0.9% FLUSH: 3.0000 mL | Freq: Two times a day (BID) | INTRAVENOUS | Status: DC

## 2017-04-01 MED ORDER — FUROSEMIDE 10 MG/ML IJ SOLN
20.0000 mg | Freq: Once | INTRAMUSCULAR | Status: AC
  Administered 2017-04-01: 15:00:00 20 mg via INTRAVENOUS
  Filled 2017-04-01: qty 2

## 2017-04-01 MED ORDER — FERROUS SULFATE 325 (65 FE) MG PO TABS
325.0000 mg | ORAL_TABLET | Freq: Every day | ORAL | Status: DC
  Administered 2017-04-01 – 2017-04-03 (×3): 325 mg via ORAL
  Filled 2017-04-01 (×3): qty 1

## 2017-04-01 MED ORDER — IOPAMIDOL (ISOVUE-370) INJECTION 76%
INTRAVENOUS | Status: AC
  Filled 2017-04-01: qty 100

## 2017-04-01 MED ORDER — HEPARIN (PORCINE) IN NACL 2-0.9 UNIT/ML-% IJ SOLN
INTRAMUSCULAR | Status: AC | PRN
  Administered 2017-04-01: 1000 mL

## 2017-04-01 MED ORDER — SODIUM CHLORIDE 0.9 % IV SOLN
INTRAVENOUS | Status: DC
  Administered 2017-04-01: 09:00:00 via INTRAVENOUS

## 2017-04-01 MED ORDER — VERAPAMIL HCL 2.5 MG/ML IV SOLN
INTRAVENOUS | Status: AC
  Filled 2017-04-01: qty 2

## 2017-04-01 MED ORDER — SODIUM CHLORIDE 0.9% FLUSH
3.0000 mL | Freq: Two times a day (BID) | INTRAVENOUS | Status: DC
  Administered 2017-04-01 – 2017-04-03 (×4): 3 mL via INTRAVENOUS

## 2017-04-01 MED ORDER — INSULIN DETEMIR 100 UNIT/ML ~~LOC~~ SOLN
50.0000 [IU] | Freq: Two times a day (BID) | SUBCUTANEOUS | Status: DC
  Administered 2017-04-01 (×2): 50 [IU] via SUBCUTANEOUS
  Filled 2017-04-01 (×3): qty 0.5

## 2017-04-01 MED ORDER — GABAPENTIN 300 MG PO CAPS
600.0000 mg | ORAL_CAPSULE | Freq: Every day | ORAL | Status: DC
  Administered 2017-04-01 – 2017-04-02 (×2): 600 mg via ORAL
  Filled 2017-04-01 (×2): qty 2

## 2017-04-01 MED ORDER — INSULIN ASPART 100 UNIT/ML ~~LOC~~ SOLN
10.0000 [IU] | Freq: Three times a day (TID) | SUBCUTANEOUS | Status: DC
  Administered 2017-04-01: 18:00:00 10 [IU] via SUBCUTANEOUS
  Administered 2017-04-02: 5 [IU] via SUBCUTANEOUS

## 2017-04-01 MED ORDER — TICAGRELOR 90 MG PO TABS
90.0000 mg | ORAL_TABLET | Freq: Two times a day (BID) | ORAL | Status: DC
  Administered 2017-04-01 – 2017-04-03 (×4): 90 mg via ORAL
  Filled 2017-04-01 (×4): qty 1

## 2017-04-01 MED ORDER — VITAMIN D 1000 UNITS PO TABS
2500.0000 [IU] | ORAL_TABLET | Freq: Every day | ORAL | Status: DC
  Administered 2017-04-01 – 2017-04-03 (×3): 2500 [IU] via ORAL
  Filled 2017-04-01: qty 2.5
  Filled 2017-04-01 (×2): qty 3
  Filled 2017-04-01: qty 2.5

## 2017-04-01 MED ORDER — SODIUM CHLORIDE 0.9 % WEIGHT BASED INFUSION: 1.0000 mL/kg/h | INTRAVENOUS | Status: AC

## 2017-04-01 MED ORDER — MIDAZOLAM HCL 2 MG/2ML IJ SOLN
INTRAMUSCULAR | Status: DC | PRN
Start: 2017-04-01 — End: 2017-04-01
  Administered 2017-04-01: 1 mg via INTRAVENOUS

## 2017-04-01 MED ORDER — ATORVASTATIN CALCIUM 80 MG PO TABS
80.0000 mg | ORAL_TABLET | Freq: Every day | ORAL | Status: DC
  Administered 2017-04-01 – 2017-04-03 (×3): 80 mg via ORAL
  Filled 2017-04-01 (×3): qty 1

## 2017-04-01 MED ORDER — FUROSEMIDE 20 MG PO TABS
20.0000 mg | ORAL_TABLET | Freq: Every day | ORAL | Status: DC
  Administered 2017-04-02 – 2017-04-03 (×2): 20 mg via ORAL
  Filled 2017-04-01 (×2): qty 1

## 2017-04-01 MED ORDER — ASPIRIN 81 MG PO CHEW: 81.0000 mg | CHEWABLE_TABLET | ORAL | Status: DC

## 2017-04-01 MED ORDER — ASPIRIN 81 MG PO CHEW
81.0000 mg | CHEWABLE_TABLET | Freq: Every day | ORAL | Status: DC
  Administered 2017-04-02 – 2017-04-03 (×2): 81 mg via ORAL
  Filled 2017-04-01 (×2): qty 1

## 2017-04-01 MED ORDER — FENTANYL CITRATE (PF) 100 MCG/2ML IJ SOLN
INTRAMUSCULAR | Status: AC
  Filled 2017-04-01: qty 2

## 2017-04-01 MED ORDER — ISOSORBIDE MONONITRATE ER 30 MG PO TB24
30.0000 mg | ORAL_TABLET | Freq: Every day | ORAL | Status: DC
  Administered 2017-04-01 – 2017-04-03 (×3): 30 mg via ORAL
  Filled 2017-04-01 (×3): qty 1

## 2017-04-01 MED ORDER — PANTOPRAZOLE SODIUM 40 MG PO TBEC
40.0000 mg | DELAYED_RELEASE_TABLET | Freq: Every day | ORAL | Status: DC
  Filled 2017-04-01 (×2): qty 1

## 2017-04-01 MED ORDER — TIZANIDINE HCL 4 MG PO TABS
4.0000 mg | ORAL_TABLET | Freq: Every evening | ORAL | Status: DC | PRN
  Filled 2017-04-01: qty 1

## 2017-04-01 MED ORDER — HEPARIN SODIUM (PORCINE) 1000 UNIT/ML IJ SOLN
INTRAMUSCULAR | Status: AC
  Filled 2017-04-01: qty 1

## 2017-04-01 SURGICAL SUPPLY — 18 items
BALLN SAPPHIRE 2.5X15 (BALLOONS) ×2
BALLN SAPPHIRE ~~LOC~~ 3.0X18 (BALLOONS) ×1 IMPLANT
BALLN SAPPHIRE ~~LOC~~ 3.25X15 (BALLOONS) ×1 IMPLANT
BALLOON SAPPHIRE 2.5X15 (BALLOONS) IMPLANT
CATH INFINITI 5 FR JL3.5 (CATHETERS) ×1 IMPLANT
CATH LAUNCHER 6FR JR4 (CATHETERS) ×1 IMPLANT
DEVICE RAD COMP TR BAND LRG (VASCULAR PRODUCTS) ×1 IMPLANT
ELECT DEFIB PAD ADLT CADENCE (PAD) ×1 IMPLANT
GLIDESHEATH SLEND SS 6F .021 (SHEATH) ×1 IMPLANT
GUIDEWIRE INQWIRE 1.5J.035X260 (WIRE) IMPLANT
INQWIRE 1.5J .035X260CM (WIRE) ×2
KIT ENCORE 26 ADVANTAGE (KITS) ×1 IMPLANT
KIT HEART LEFT (KITS) ×2 IMPLANT
PACK CARDIAC CATHETERIZATION (CUSTOM PROCEDURE TRAY) ×2 IMPLANT
STENT SYNERGY DES 2.75X38 (Permanent Stent) ×1 IMPLANT
TRANSDUCER W/STOPCOCK (MISCELLANEOUS) ×2 IMPLANT
TUBING CIL FLEX 10 FLL-RA (TUBING) ×2 IMPLANT
WIRE ASAHI PROWATER 180CM (WIRE) ×1 IMPLANT

## 2017-04-01 NOTE — Progress Notes (Signed)
TR BAND REMOVAL  LOCATION:    right radial  DEFLATED PER PROTOCOL:    Yes.    TIME BAND OFF / DRESSING APPLIED:    1530   SITE UPON ARRIVAL:    Level 0  SITE AFTER BAND REMOVAL:    Level 0  CIRCULATION SENSATION AND MOVEMENT:    Within Normal Limits   Yes.    COMMENTS:   TOLERATED PROCEDURE WELL

## 2017-04-01 NOTE — Progress Notes (Signed)
While getting Stephanie Manning ready for her procedure she stated she had left her LifeVest at home. She finds it very uncomfortable and hates wearing it.

## 2017-04-01 NOTE — Care Management Note (Addendum)
Case Management Note  Patient Details  Name: Breaunna Gottlieb MRN: [id number] Date of Birth: [birth year]  Subjective/Objective:  From home,pta indep, s/p coronary stent intervention,will be on brilinta, which he was previously on pta.    1/19 1055 Tomi Bamberger RN, BSN- patient with low grade temp and hgb dropping today.                 Action/Plan: NCM will follow for dc needs.   Expected Discharge Date:                  Expected Discharge Plan:  Home/Self Care  In-House Referral:     Discharge planning Services  CM Consult  Post Acute Care Choice:    Choice offered to:     DME Arranged:    DME Agency:     HH Arranged:    [place] Agency:     Status of Service:  Completed, signed off  If discussed at [employer] of Stay Meetings, dates discussed:    Additional Comments:  Zenon Mayo, RN 04/01/2017, 1:48 PM

## 2017-04-01 NOTE — Interval H&P Note (Signed)
History and Physical Interval Note:  04/01/2017 9:46 AM  Stephanie Manning  has presented today for surgery, with the diagnosis of cad  The various methods of treatment have been discussed with the patient and family. After consideration of risks, benefits and other options for treatment, the patient has consented to  Procedure(s): CORONARY STENT INTERVENTION (N/A) as a surgical intervention .  The patient's history has been reviewed, patient examined, no change in status, stable for surgery.  I have reviewed the patient's chart and labs.  Questions were answered to the patient's satisfaction.   Cath Lab Visit (complete for each Cath Lab visit)  Clinical Evaluation Leading to the Procedure:   ACS: No.  Non-ACS:    Anginal Classification: CCS III  Anti-ischemic medical therapy: Minimal Therapy (1 class of medications)  Non-Invasive Test Results: No non-invasive testing performed  Prior CABG: No previous CABG        Stephanie Manning [facility] 04/01/2017 9:46 AM

## 2017-04-02 ENCOUNTER — Other Ambulatory Visit: Payer: Self-pay

## 2017-04-02 DIAGNOSIS — I130 Hypertensive heart and chronic kidney disease with heart failure and stage 1 through stage 4 chronic kidney disease, or unspecified chronic kidney disease: Secondary | ICD-10-CM | POA: Diagnosis present

## 2017-04-02 DIAGNOSIS — E785 Hyperlipidemia, unspecified: Secondary | ICD-10-CM | POA: Diagnosis present

## 2017-04-02 DIAGNOSIS — E876 Hypokalemia: Secondary | ICD-10-CM | POA: Diagnosis present

## 2017-04-02 DIAGNOSIS — N183 Chronic kidney disease, stage 3 (moderate): Secondary | ICD-10-CM | POA: Diagnosis present

## 2017-04-02 DIAGNOSIS — Z955 Presence of coronary angioplasty implant and graft: Secondary | ICD-10-CM | POA: Diagnosis not present

## 2017-04-02 DIAGNOSIS — Z880 Allergy status to penicillin: Secondary | ICD-10-CM | POA: Diagnosis not present

## 2017-04-02 DIAGNOSIS — D500 Iron deficiency anemia secondary to blood loss (chronic): Secondary | ICD-10-CM | POA: Diagnosis not present

## 2017-04-02 DIAGNOSIS — Z87891 Personal history of nicotine dependence: Secondary | ICD-10-CM | POA: Diagnosis not present

## 2017-04-02 DIAGNOSIS — D649 Anemia, unspecified: Secondary | ICD-10-CM | POA: Diagnosis present

## 2017-04-02 DIAGNOSIS — Z794 Long term (current) use of insulin: Secondary | ICD-10-CM

## 2017-04-02 DIAGNOSIS — E118 Type 2 diabetes mellitus with unspecified complications: Secondary | ICD-10-CM

## 2017-04-02 DIAGNOSIS — I209 Angina pectoris, unspecified: Secondary | ICD-10-CM

## 2017-04-02 DIAGNOSIS — I10 Essential (primary) hypertension: Secondary | ICD-10-CM

## 2017-04-02 DIAGNOSIS — Z79899 Other long term (current) drug therapy: Secondary | ICD-10-CM | POA: Diagnosis not present

## 2017-04-02 DIAGNOSIS — E1122 Type 2 diabetes mellitus with diabetic chronic kidney disease: Secondary | ICD-10-CM | POA: Diagnosis present

## 2017-04-02 DIAGNOSIS — K5900 Constipation, unspecified: Secondary | ICD-10-CM | POA: Diagnosis present

## 2017-04-02 DIAGNOSIS — Z91018 Allergy to other foods: Secondary | ICD-10-CM | POA: Diagnosis not present

## 2017-04-02 DIAGNOSIS — Z7982 Long term (current) use of aspirin: Secondary | ICD-10-CM | POA: Diagnosis not present

## 2017-04-02 DIAGNOSIS — E11649 Type 2 diabetes mellitus with hypoglycemia without coma: Secondary | ICD-10-CM | POA: Diagnosis present

## 2017-04-02 DIAGNOSIS — I255 Ischemic cardiomyopathy: Secondary | ICD-10-CM | POA: Diagnosis present

## 2017-04-02 DIAGNOSIS — I5041 Acute combined systolic (congestive) and diastolic (congestive) heart failure: Secondary | ICD-10-CM

## 2017-04-02 DIAGNOSIS — I213 ST elevation (STEMI) myocardial infarction of unspecified site: Secondary | ICD-10-CM | POA: Diagnosis present

## 2017-04-02 DIAGNOSIS — Z885 Allergy status to narcotic agent status: Secondary | ICD-10-CM | POA: Diagnosis not present

## 2017-04-02 DIAGNOSIS — Z888 Allergy status to other drugs, medicaments and biological substances status: Secondary | ICD-10-CM | POA: Diagnosis not present

## 2017-04-02 DIAGNOSIS — I5043 Acute on chronic combined systolic (congestive) and diastolic (congestive) heart failure: Secondary | ICD-10-CM | POA: Diagnosis present

## 2017-04-02 DIAGNOSIS — I25119 Atherosclerotic heart disease of native coronary artery with unspecified angina pectoris: Secondary | ICD-10-CM | POA: Diagnosis present

## 2017-04-02 LAB — IRON AND TIBC
IRON: 48 ug/dL (ref 28–170)
SATURATION RATIOS: 14 % (ref 10.4–31.8)
TIBC: 349 ug/dL (ref 250–450)
UIBC: 301 ug/dL

## 2017-04-02 LAB — LACTATE DEHYDROGENASE: LDH: 189 U/L (ref 98–192)

## 2017-04-02 LAB — BASIC METABOLIC PANEL
ANION GAP: 8 (ref 5–15)
BUN: 19 mg/dL (ref 6–20)
CALCIUM: 8.7 mg/dL — AB (ref 8.9–10.3)
CO2: 22 mmol/L (ref 22–32)
Chloride: 111 mmol/L (ref 101–111)
Creatinine, Ser: 1.68 mg/dL — ABNORMAL HIGH (ref 0.44–1.00)
GFR calc non Af Amer: 33 mL/min — ABNORMAL LOW (ref >60)
GFR, EST AFRICAN AMERICAN: 38 mL/min — AB (ref >60)
Glucose, Bld: 78 mg/dL (ref 65–99)
Potassium: 4.1 mmol/L (ref 3.5–5.1)
Sodium: 141 mmol/L (ref 135–145)

## 2017-04-02 LAB — CBC
HCT: 25.7 % — ABNORMAL LOW (ref 36.0–46.0)
HEMOGLOBIN: 7.9 g/dL — AB (ref 12.0–15.0)
MCH: 26.1 pg (ref 26.0–34.0)
MCHC: 30.7 g/dL (ref 30.0–36.0)
MCV: 84.8 fL (ref 78.0–100.0)
Platelets: 207 10*3/uL (ref 150–400)
RBC: 3.03 MIL/uL — AB (ref 3.87–5.11)
RDW: 14.6 % (ref 11.5–15.5)
WBC: 7.6 10*3/uL (ref 4.0–10.5)

## 2017-04-02 LAB — GLUCOSE, CAPILLARY
GLUCOSE-CAPILLARY: 65 mg/dL (ref 65–99)
GLUCOSE-CAPILLARY: 78 mg/dL (ref 65–99)
GLUCOSE-CAPILLARY: 65 mg/dL (ref 65–99)
Glucose-Capillary: 107 mg/dL — ABNORMAL HIGH (ref 65–99)
Glucose-Capillary: 126 mg/dL — ABNORMAL HIGH (ref 65–99)

## 2017-04-02 LAB — FERRITIN: Ferritin: 111 ng/mL (ref 11–307)

## 2017-04-02 MED ORDER — INSULIN DETEMIR 100 UNIT/ML ~~LOC~~ SOLN
20.0000 [IU] | Freq: Two times a day (BID) | SUBCUTANEOUS | Status: DC
  Administered 2017-04-02 – 2017-04-03 (×2): 20 [IU] via SUBCUTANEOUS
  Filled 2017-04-02 (×3): qty 0.2

## 2017-04-02 MED ORDER — INSULIN ASPART 100 UNIT/ML ~~LOC~~ SOLN
0.0000 [IU] | Freq: Three times a day (TID) | SUBCUTANEOUS | Status: DC
  Administered 2017-04-03: 1 [IU] via SUBCUTANEOUS
  Administered 2017-04-03 (×2): 2 [IU] via SUBCUTANEOUS

## 2017-04-02 MED ORDER — INSULIN DETEMIR 100 UNIT/ML ~~LOC~~ SOLN
25.0000 [IU] | Freq: Once | SUBCUTANEOUS | Status: AC
  Administered 2017-04-02: 10:00:00 25 [IU] via SUBCUTANEOUS
  Filled 2017-04-02: qty 0.25

## 2017-04-02 MED ORDER — INSULIN DETEMIR 100 UNIT/ML ~~LOC~~ SOLN: 50.0000 [IU] | Freq: Two times a day (BID) | SUBCUTANEOUS | Status: DC

## 2017-04-02 MED ORDER — PANTOPRAZOLE SODIUM 40 MG PO TBEC
40.0000 mg | DELAYED_RELEASE_TABLET | Freq: Two times a day (BID) | ORAL | Status: DC
  Administered 2017-04-02 – 2017-04-03 (×4): 40 mg via ORAL
  Filled 2017-04-02 (×3): qty 1

## 2017-04-02 MED ORDER — INSULIN ASPART 100 UNIT/ML ~~LOC~~ SOLN: 0.0000 [IU] | Freq: Every day | SUBCUTANEOUS | Status: DC

## 2017-04-02 MED ORDER — DOCUSATE SODIUM 100 MG PO CAPS
100.0000 mg | ORAL_CAPSULE | Freq: Every day | ORAL | Status: DC
  Administered 2017-04-02: 100 mg via ORAL
  Filled 2017-04-02 (×2): qty 1

## 2017-04-02 MED ORDER — INSULIN ASPART 100 UNIT/ML ~~LOC~~ SOLN
5.0000 [IU] | Freq: Once | SUBCUTANEOUS | Status: AC
Start: 2017-04-02 — End: 2017-04-02

## 2017-04-02 NOTE — Progress Notes (Signed)
Called by nursing for [facility] on lower side, inquiring about scheduled 10 units of insulin. I see where Levemir was halved for tonight which I presume was also for low blood sugars. She does have scheduled short acting ordered as well, 10 units TID. Will d/c the short-acting scheduled insulin for now. Any breakthrough higher levels can be addressed by SSI in the short term. Will need to follow blood sugars on lower dose of Levemir.  Fabion Gatson PA-C

## 2017-04-02 NOTE — Progress Notes (Signed)
NURSING PROGRESS NOTE  Illene Sweeting [id number] Transfer Data: 04/02/2017 11:24 AM Attending Provider: [name], [name], MD ONG:[id number], Pcp Not In Code Status: full   Makeyla Govan is a 57 y.o. female patient transferred from short stay  -No acute distress noted.  -No complaints of shortness of breath.  -No complaints of chest pain.   Cardiac Monitoring: Box # 30 in place. Cardiac monitor yields:NSR.  Blood pressure 120/75, pulse 78, temperature 99.1 F (37.3 C), temperature source Oral, resp. rate 16, height 5' 8.5" (1.74 m), weight 110.8 kg (244 lb 4.3 oz), SpO2 99 %.   IV Fluids:  IV L hand & R Hand in place, occlusive dsg intact without redness, IV cath Saline Locked.   Allergies:  Amoxicillin; Hydrocodone; Lisinopril; Meloxicam; Strawberry extract; and Tape  Past Medical History:   has a past medical history of Acute ST elevation myocardial infarction (STEMI) involving left anterior descending (LAD) coronary artery ([place]) (03/10/2017), Anemia, Coronary artery disease involving native coronary artery of native heart with unstable angina pectoris ([place]) (03/11/2017), Diabetes mellitus, type II, insulin dependent ([place]), Hyperlipidemia, and Hypertension.  Past Surgical History:   has a past surgical history that includes Bilateral oophorectomy; Cesarean section; LEFT HEART CATH AND CORONARY ANGIOGRAPHY (N/A, 03/10/2017); Coronary/Graft Acute MI Revascularization (N/A, 03/10/2017); Coronary stent placement (04/01/2017); CORONARY STENT INTERVENTION (N/A, 04/01/2017); and LEFT HEART CATH AND CORONARY ANGIOGRAPHY (N/A, 04/01/2017).  Social History:   reports that  has never smoked. she has never used smokeless tobacco. She reports that she does not drink alcohol or use drugs.  Skin: Intact  Patient/Family orientated to room. Information packet given to patient/family. Admission inpatient armband information verified with patient/family to include name and date of birth and placed  on patient arm. Side rails up x 2, fall assessment and education completed with patient/family. Patient/family able to verbalize understanding of risk associated with falls and verbalized understanding to call for assistance before getting out of bed. Call light within reach. Patient/family able to voice and demonstrate understanding of unit orientation instructions.    Will continue to evaluate and treat per MD orders.

## 2017-04-02 NOTE — Progress Notes (Addendum)
CARDIAC REHAB PHASE I   PRE:  Rate/Rhythm: 91 SR    BP: sitting 132/62    SaO2: 98 RA  MODE:  Ambulation: 400 ft   POST:  Rate/Rhythm: 110 ST    BP: sitting 167/90     SaO2: 99 RA  Pt c/o slight HA. Able to walk slowly, independently. Felt fairly well walking but did have significant SOB once she sat down. HR 110 ST, SaO2 99 RA. Reviewed Brilinta, HF, ex, NTG, and CRPII. Good reception although she a little overwhelmed. Encouraged more walking here in the hospital. [facility] has called her but her staged PCI was scheduled. She is to call them once she gets home. [place], ACSM 04/02/2017 9:00 AM

## 2017-04-02 NOTE — Progress Notes (Signed)
Progress Note  Patient Name: Stephanie Manning Date of Encounter: 04/02/2017  Primary Cardiologist:Dr. [name]  Subjective   Feeling well. No chest pain, sob or palpitations.   Inpatient Medications    Scheduled Meds: . aspirin  81 mg Oral Daily  . atorvastatin  80 mg Oral q1800  . carvedilol  25 mg Oral BID WC  . cholecalciferol  2,500 Units Oral Daily  . ferrous sulfate  325 mg Oral Q breakfast  . furosemide  20 mg Oral Daily  . gabapentin  600 mg Oral QHS  . insulin aspart  10 Units Subcutaneous TID WC  . insulin detemir  50 Units Subcutaneous BID  . isosorbide mononitrate  30 mg Oral Daily  . multivitamin with minerals  1 tablet Oral Daily  . pantoprazole  40 mg Oral Daily  . potassium chloride SA  20 mEq Oral BID  . sodium chloride flush  3 mL Intravenous Q12H  . ticagrelor  90 mg Oral BID   Continuous Infusions: . sodium chloride     PRN Meds: sodium chloride, acetaminophen, nitroGLYCERIN, ondansetron (ZOFRAN) IV, sodium chloride flush, tiZANidine   Vital Signs    Vitals:   04/01/17 1931 04/01/17 2000 04/02/17 0302 04/02/17 0654  BP: (!) 118/51 (!) 105/47 (!) 138/57   Pulse: 70 71 83   Resp: 20 18 15    Temp: 98.8 F (37.1 C)  (!) 100.8 F (38.2 C)   TempSrc: Oral  Oral   SpO2: 98% 98% 99%   Weight:    244 lb 4.3 oz (110.8 kg)  Height:        Intake/Output Summary (Last 24 hours) at 04/02/2017 0804 Last data filed at 04/02/2017 0500 Gross per 24 hour  Intake 240 ml  Output 1000 ml  Net -760 ml   Filed Weights   04/01/17 0810 04/02/17 0654  Weight: 237 lb (107.5 kg) 244 lb 4.3 oz (110.8 kg)    Telemetry    SR- Personally Reviewed  ECG    None today   Physical Exam   GEN: No acute distress.   Neck: No JVD Cardiac: RRR, no murmurs, rubs, or gallops. R radial cath site stable Respiratory: Clear to auscultation bilaterally. GI: Soft, nontender, non-distended  MS: No edema; No deformity. Neuro:  Nonfocal  Psych: Normal affect   Labs      Chemistry Recent Labs  Lab 03/30/17 1530 04/02/17 0244  NA 142 141  K 4.3 4.1  CL 113* 111  CO2 19* 22  GLUCOSE 116* 78  BUN 14 19  CREATININE 1.38* 1.68*  CALCIUM 9.1 8.7*  GFRNONAA 42* 33*  GFRAA 48* 38*  ANIONGAP 10 8     Hematology Recent Labs  Lab 03/30/17 1530 04/02/17 0244  WBC 7.4 7.6  RBC 3.41* 3.03*  HGB 9.1* 7.9*  HCT 29.0* 25.7*  MCV 85.0 84.8  MCH 26.7 26.1  MCHC 31.4 30.7  RDW 14.9 14.6  PLT 248 207    Cardiac Enzymes Recent Labs  Lab 03/30/17 2132  TROPONINI <0.03    Recent Labs  Lab 03/30/17 1602  TROPIPOC 0.00     BNP Recent Labs  Lab 03/30/17 2122  BNP 462.4*     DDimer No results for input(s): DDIMER in the last 168 hours.   Radiology    No results found.  Cardiac Studies   CORONARY STENT INTERVENTION  LEFT HEART CATH AND CORONARY ANGIOGRAPHY  Conclusion     Dist LAD lesion is 100% stenosed.  Previously placed  Prox LAD drug eluting stent is widely patent.  Ost 1st Diag to 1st Diag lesion is 90% stenosed.  Prox Cx to Mid Cx lesion is 50% stenosed.  Dist Cx lesion is 90% stenosed.  3rd Mrg lesion is 90% stenosed.  Mid Cx lesion is 85% stenosed.  Prox RCA to Mid RCA lesion is 80% stenosed.  A drug-eluting stent was successfully placed using a STENT SYNERGY DES [id number].  Post intervention, there is a 0% residual stenosis.  LV end diastolic pressure is moderately elevated.   1. 3 vessel obstructive CAD   - patent stent in the proximal to mid LAD. CTO of the distal LAD   - 90% diffuse small first diagonal   - 90% distal LCx/OM   - 80% diffuse proximal RCA 2. Moderately elevated LVEDP- 28 mm Hg 3. Successful stenting of the proximal to mid RCA with DES  Plan: continue DAPT for at least one year. Will start lasix for elevated LVEDP. Add nitrates for treatment of residual disease-vessels too small for PCI. Anticipate DC in am.     Patient Profile     58 y.o. 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  The patient was transferred from [facility] in the setting of ST elevated MI with complaints of subscapular pain which began the evening before.  Patient also had elevated proBNP of 2215.  Evidence of pulmonary edema per chest x-ray.  Cardiac catheterization was completed emergently which revealed a 100% LAD occlusion, with successful PCI/DES x1 with persistent occlusion of the distal LAD.  The patient also was found to have severe disease in the distal circumflex, mild diffuse disease in the proximal to mid RCA.  Echocardiogram revealed an LVEF of 30% to 35% with grade 2 diastolic dysfunction.  Subsequently the patient was planned for a staged intervention to her right coronary artery , however, creatinine was elevated.    He was given IV hydration strted arted on carvedilol, ACE inhibitor was not begun due to renal insufficiency.  The patient troponin peaked at 17.88.  Hemoglobin A1c was elevated at 8.5.  On follow-up appointment she was to be scheduled for staged intervention after recovering from MI.  LifeVest was scheduled prior to discharge.  Seen in clinic 03/24/17 and set up for staged PCI.    Assessment & Plan    1. CAD - Cath showed patent pLAD stent. S/p successful stenting of the proximal to mid RCA with DES. Added Imdur for residual disease-vessels too small for PCI. - Continue ASA, statin, Brillinta, Imdue and BB.   2. ICM/Chronic systolic CHF - cath showed elevated LVEDP. Started on low dose lasix. Volume status stable.  - Continue BB. Not on ACE/ARB due to CKD.   3. AonCKD stage II - Scr elevated to 1.68. Follow closely  4. Anemia  -Hemoglobin gradually trending down. Today 7.9.   Ref. Range 03/10/2017 16:32 03/11/2017  03:54 03/13/2017 02:53 03/15/2017 02:53 03/16/2017 04:11 03/24/2017 11:55 03/30/2017 15:30 04/02/2017 02:44  Hemoglobin Latest Ref Range: 12.0 - 15.0 g/dL 11.1 (L) 10.7 (L) 10.3 (L) 9.3 (L) 8.8 (L) 9.6 (L) 9.1 (L) 7.9 (L)  - Will check stool guiac. Continue ASA and brillinta for now.   For questions or updates, please contact [place] Please consult [email or web address] for contact info under Cardiology/STEMI.  Jarrett Soho, PA  04/02/2017, 8:04 AM

## 2017-04-02 NOTE — Progress Notes (Signed)
Hypoglycemic Event  CBG: 65 (06:09)  Treatment: 15 GM carbohydrate snack  Symptoms: None  Follow-up CBG: Time:06:39 CBG Result:78  Possible Reasons for Event: Medication Regimen      Stephanie Manning

## 2017-04-02 NOTE — Progress Notes (Addendum)
Inpatient Diabetes Program Recommendations  AACE/ADA: New Consensus Statement on Inpatient Glycemic Control (2015)  Target Ranges:  Prepandial:   less than 140 mg/dL      Peak postprandial:   less than 180 mg/dL (1-2 hours)      Critically ill patients:  140 - 180 mg/dL   Results for GLENDALE, WHERRY (MRN [id number]) as of 04/02/2017 10:03  Ref. Range 04/01/2017 12:42 04/01/2017 16:54 04/01/2017 21:20 04/02/2017 06:09 04/02/2017 06:39  Glucose-Capillary Latest Ref Range: 65 - 99 mg/dL 72 174 (H) 146 (H) 65 78   Review of Glycemic Control  Diabetes history:  DM2 Inpatient Diabetes medications: Levemir 50 units BID, Novolog 10 units TID with meals Current orders for Inpatient glycemic control: Levemir 50 units BID, Levemir 25 units x 1 at 10:00 am today, Novolog 10 units TID with meals, Novolog 5 units x 1 today at 8:30 am  Inpatient Diabetes Program Recommendations: Insulin - Basal: Noted hypoglycemia today. Patient received Levemir 50 units at 14:59 and Levemir 50 units at 21:40 on 04/01/17. Anticipate hypoglycemia due to patient getting Levemir doses within 6 hours and 40 mins of each other. Recommend decreasing Levemir.  Talked to RN and she reports that patient received Levemir 25 units this morning at 10am. Recommend decreasing Levemir to 20 units BID.  Insulin-Correction: Please consider ordering Novolog 0-9 units TID with meals and Novolog 0-5 units QHS for bedtime correction scale.  NOTE: In reviewing chart, noted patient ws hospitalized from 03/10/17 to 03/16/17 and experienced hypoglycemia when ordered Levemir 50 units BID. Basal insulin was changed to Levemir 40 units QHS and glucose was fairly controlled with basal, meal coverage and correction insulin. Recommend decreasing Levemir.  Thanks, Barnie Alderman, RN, MSN, CDE Diabetes Coordinator Inpatient Diabetes Program [phone] (Team Pager from 8am to 5pm)

## 2017-04-03 ENCOUNTER — Encounter (HOSPITAL_COMMUNITY): Payer: Self-pay | Admitting: Physician Assistant

## 2017-04-03 DIAGNOSIS — D649 Anemia, unspecified: Secondary | ICD-10-CM

## 2017-04-03 DIAGNOSIS — N183 Chronic kidney disease, stage 3 (moderate): Secondary | ICD-10-CM

## 2017-04-03 DIAGNOSIS — N184 Chronic kidney disease, stage 4 (severe): Secondary | ICD-10-CM

## 2017-04-03 DIAGNOSIS — I2510 Atherosclerotic heart disease of native coronary artery without angina pectoris: Secondary | ICD-10-CM

## 2017-04-03 LAB — BASIC METABOLIC PANEL
Anion gap: 9 (ref 5–15)
BUN: 19 mg/dL (ref 6–20)
CALCIUM: 8.7 mg/dL — AB (ref 8.9–10.3)
CO2: 21 mmol/L — ABNORMAL LOW (ref 22–32)
CREATININE: 1.64 mg/dL — AB (ref 0.44–1.00)
Chloride: 107 mmol/L (ref 101–111)
GFR calc Af Amer: 39 mL/min — ABNORMAL LOW (ref >60)
GFR, EST NON AFRICAN AMERICAN: 34 mL/min — AB (ref >60)
Glucose, Bld: 152 mg/dL — ABNORMAL HIGH (ref 65–99)
Potassium: 4.4 mmol/L (ref 3.5–5.1)
SODIUM: 137 mmol/L (ref 135–145)

## 2017-04-03 LAB — SOLUBLE TRANSFERRIN RECEPTOR: TRANSFERRIN RECEPTOR: 33.2 nmol/L — AB (ref 12.2–27.3)

## 2017-04-03 LAB — CBC
HCT: 25.5 % — ABNORMAL LOW (ref 36.0–46.0)
Hemoglobin: 8.3 g/dL — ABNORMAL LOW (ref 12.0–15.0)
MCH: 27.5 pg (ref 26.0–34.0)
MCHC: 32.5 g/dL (ref 30.0–36.0)
MCV: 84.4 fL (ref 78.0–100.0)
PLATELETS: 194 10*3/uL (ref 150–400)
RBC: 3.02 MIL/uL — ABNORMAL LOW (ref 3.87–5.11)
RDW: 14.6 % (ref 11.5–15.5)
WBC: 7.9 10*3/uL (ref 4.0–10.5)

## 2017-04-03 LAB — HAPTOGLOBIN: Haptoglobin: 319 mg/dL — ABNORMAL HIGH (ref 34–200)

## 2017-04-03 LAB — GLUCOSE, CAPILLARY
GLUCOSE-CAPILLARY: 135 mg/dL — AB (ref 65–99)
Glucose-Capillary: 163 mg/dL — ABNORMAL HIGH (ref 65–99)
Glucose-Capillary: 192 mg/dL — ABNORMAL HIGH (ref 65–99)

## 2017-04-03 LAB — OCCULT BLOOD X 1 CARD TO LAB, STOOL: Fecal Occult Bld: NEGATIVE

## 2017-04-03 MED ORDER — FUROSEMIDE 20 MG PO TABS: 20.0000 mg | ORAL_TABLET | Freq: Every day | ORAL | 1 refills | Status: DC

## 2017-04-03 MED ORDER — INSULIN DETEMIR 100 UNIT/ML ~~LOC~~ SOLN: 25.0000 [IU] | Freq: Two times a day (BID) | SUBCUTANEOUS | Status: DC

## 2017-04-03 MED ORDER — PANTOPRAZOLE SODIUM 40 MG PO TBEC: 40.0000 mg | DELAYED_RELEASE_TABLET | Freq: Two times a day (BID) | ORAL | 1 refills | Status: DC

## 2017-04-03 MED ORDER — POLYETHYLENE GLYCOL 3350 17 G PO PACK
17.0000 g | PACK | Freq: Every day | ORAL | Status: DC
  Administered 2017-04-03: 17 g via ORAL
  Filled 2017-04-03: qty 1

## 2017-04-03 MED ORDER — ISOSORBIDE MONONITRATE ER 30 MG PO TB24: 30.0000 mg | ORAL_TABLET | Freq: Every day | ORAL | 6 refills | Status: DC

## 2017-04-03 NOTE — Discharge Summary (Signed)
Discharge Summary    Patient ID: Stephanie Manning,  MRN: [id number], DOB/AGE: 58-15-1962 58 y.o.  Admit date: 08-Apr-2017 Discharge date: 04/03/2017  Primary Care Provider: System, Pcp Not In Primary Cardiologist: Dr. [name]  Discharge Diagnoses    Principal Problem:   Angina pectoris [facility]) Active Problems:   CAD in native artery   Type 2 diabetes mellitus with complication, with long-term current use of insulin ([place])   Essential hypertension   Hyperlipidemia with target low density lipoprotein (LDL) cholesterol less than 70 mg/dL   Chronic anemia   Ischemic cardiomyopathy   Acute combined systolic and diastolic heart failure (HCC)   CKD (chronic kidney disease), stage III ([place])    Diagnostic Studies/Procedures    Procedures 04/08/17  CORONARY STENT INTERVENTION  LEFT HEART CATH AND CORONARY ANGIOGRAPHY  Conclusion     Dist LAD lesion is 100% stenosed.  Previously placed Prox LAD drug eluting stent is widely patent.  Ost 1st Diag to 1st Diag lesion is 90% stenosed.  Prox Cx to Mid Cx lesion is 50% stenosed.  Dist Cx lesion is 90% stenosed.  3rd Mrg lesion is 90% stenosed.  Mid Cx lesion is 85% stenosed.  Prox RCA to Mid RCA lesion is 80% stenosed.  A drug-eluting stent was successfully placed using a STENT SYNERGY DES [id number].  Post intervention, there is a 0% residual stenosis.  LV end diastolic pressure is moderately elevated.   1. 3 vessel obstructive CAD   - patent stent in the proximal to mid LAD. CTO of the distal LAD   - 90% diffuse small first diagonal   - 90% distal LCx/OM   - 80% diffuse proximal RCA 2. Moderately elevated LVEDP- 28 mm Hg 3. Successful stenting of the proximal to mid RCA with DES  Plan: continue DAPT for at least one year. Will start lasix for elevated LVEDP. Add nitrates for treatment of residual disease-vessels too small for PCI. Anticipate DC in am.      _____________     History of Present Illness     Stephanie Manning is a 57 y.o. female with history of IDDM, HL, HTN, obesity, anemia, recently diagnosed CAD s/p DES to dLAD 02/2017), AKI (probable CKD stage III), ischemic cardiomyopathy who presented for staged PCI.  She was recently admitted 12/27-03/16/17 with chest pain and NSTEMI with peak troponin of 17. She underwent cardiac cath with three-vessel obstructive CAD noted -100% mid LAD occlusion, which was felt to be the culprit lesion with successful PCI/DES x1 with persistent occlusion of the distal LAD, also noted to have severe disease in the distal circumflex, and diffuse disease in the proximal to mid RCA. 2D echo showed E 30-35%, grade 2 DD, mild MR/TR, +WMA. She qualified for [facility]. Hospital course notable for AKI with Cr up to 2 and hypokalemia. She was seen in follow-up and staged PCI was planned. She did continue to refuse to wear the LifeVest and risks were discussed with patient.   Hospital Course    Repeat cath 04/08/17 showed 3 vessel obstructive CAD with patent stent in the proximal to mid LAD. CTO of the distal LAD, 90% diffuse small first diagonal, 90% distal LCx/OM, 80% diffuse prox RCA, moderately elevated LVEDP 76mmHg, s/p DES to mRCA. Nitrates were added for treatment of residual disease, vessels felt too small for PCI. She was started on Lasix for LVEDP elevation (CHF).   Hospital course was notable for anemia that appeared to be continued from last admission  as well. This was 10.8 on admission 12/17, was 8.8 last discharge, down to 7.9 on 1/19. She has history of gastric ulcer in the past - had a colonoscopy in [place], [place] last year which reportedly showed a few polyps and possibly some diverticulum. She noted some maroon coloring to her stool this past week, but thought it was due to eating beets. She otherwise denied overt beleding. Protonix was increased. Iron studies were normal. Repeat Hgb today was 8.3. Hemoccult was negative. Her insulin dose was adjusted due to  hypoglycemia noted here in the hospital. It is not clear if this will need to be long term change as it is frequently the case that patients are eating differently with hospital diet compared to home habits. The patient reports she was actually doing two different sliding scales per her PCP on both Novolog and Levemir. She was asked to continue Levemir at lower dose (25 units BID) and d/w primary care as soon as they open Monday. She also had mild temp elevation to 100.8 which Dr. Debara Pickett did not feel was clinically significant; no infective symptoms. Her Lasix, Imdur and protonix prescriptions were sent into her pharmacy. She already received the Lasix and Imdur as inpatient today, and has Protonix at home, so this is a non-issue if pharmacy is closed by the time discharge occurs - she knows to pick up ASAP tomorrow.  Discharge weight is 239lb. Discharge Cr 1.64. Dr. Debara Pickett has seen and examined the patient today and feels he is stable for discharge. I have sent a message to our office's scheduling team requesting a follow-up appointment within 7 days, and our office will call the patient with this information. Recommend f/u BMET and CBC at next OV. _____________  Discharge Vitals Blood pressure 121/65, pulse 72, temperature 98.9 F (37.2 C), temperature source Oral, resp. rate 16, height 5' 8.5" (1.74 m), weight 239 lb 8 oz (108.6 kg), SpO2 98 %.  Filed Weights   04/01/17 0810 04/02/17 0654 04/03/17 0558  Weight: 237 lb (107.5 kg) 244 lb 4.3 oz (110.8 kg) 239 lb 8 oz (108.6 kg)    Labs & Radiologic Studies    CBC Recent Labs    04/02/17 0244 04/03/17 0715  WBC 7.6 7.9  HGB 7.9* 8.3*  HCT 25.7* 25.5*  MCV 84.8 84.4  PLT 207 619   Basic Metabolic Panel Recent Labs    04/02/17 0244 04/03/17 0715  NA 141 137  K 4.1 4.4  CL 111 107  CO2 22 21*  GLUCOSE 78 152*  BUN 19 19  CREATININE 1.68* 1.64*  CALCIUM 8.7* 8.7*   ____________  Dg Chest 2 View  Result Date: 03/30/2017 CLINICAL  DATA:  Productive cough and worsening shortness of breath for 2 weeks. Recent myocardial infarction. EXAM: CHEST  2 VIEW COMPARISON:  03/11/2017 FINDINGS: The heart size and mediastinal contours are within normal limits. Both lungs are clear. The visualized skeletal structures are unremarkable. IMPRESSION: No active cardiopulmonary disease. Electronically Signed   By: Earle Gell M.D.   On: 03/30/2017 19:36   Dg Chest Port 1 View  Result Date: 03/11/2017 CLINICAL DATA:  Myocardial infarction. Cardiac stents placed yesterday. EXAM: PORTABLE CHEST 1 VIEW COMPARISON:  None. FINDINGS: Heart size normal. Mild interstitial edema is present bilaterally. There is no significant airspace consolidation. No definite effusions are present. IMPRESSION: 1. Mild interstitial edema without focal airspace consolidation or effusions. This may represent early congestive heart failure. Electronically Signed   By: Wynetta Fines.D.  On: 03/11/2017 13:22   Disposition   Pt is being discharged home today in stable condition.  Follow-up Plans & Appointments    Follow-up Information    [name], [name], MD Follow up.   Specialty:  Cardiology Why:  Dr. Doug Sou office will call you for a follow-up appointment Contact information: [address] [place] [address] [phone]        Primary Care Provider Follow up.   Why:  Call your doctor tomorrow to discuss workup of your anemia (repeat blood count and stool cards). You will also need to discuss your insulin regimen.         Discharge Instructions    Diet - low sodium heart healthy   Complete by:  As directed    Increase activity slowly   Complete by:  As directed    No driving until cleared by your cardiologist. No lifting over 5 lbs for 1 week. No sexual activity for 1 week. You may not return to work until cleared by your cardiologist, if applicable. Keep procedure site clean & dry. If you notice increased pain, swelling,  bleeding or pus, call/return!  You may shower, but no soaking baths/hot tubs/pools for 1 week.   If you notice any bleeding such as blood in stool, black tarry stools, blood in urine, nosebleeds or any other unusual bleeding, call your doctor immediately.  Echinacea can interact with several medications therefore this was discontinued from your medicine list.  Your insulin regimen was adjusted due to low blood sugars in the hospital. Please talk to primary care doctor as soon as their office opens to develop a plan for monitoring.      Discharge Medications   Allergies as of 04/03/2017      Reactions   Amoxicillin Other (See Comments)   Yeast infection during treatment   Hydrocodone Rash   Palpatations, gi upset   Lisinopril Other (See Comments)   Chest pain, palpatations Other reaction(s): Other - See Comments Chest pain, palpatations   Meloxicam Nausea Only   Strawberry Extract Hives   Does not always happen   Tape Rash   Some tapes cause rashes      Medication List    STOP taking these medications   ECHINACEA EXTRACT PO   insulin aspart 100 UNIT/ML injection Commonly known as:  novoLOG     TAKE these medications   aspirin 81 MG chewable tablet Chew 1 tablet (81 mg total) by mouth daily.   atorvastatin 80 MG tablet Commonly known as:  LIPITOR Take 1 tablet (80 mg total) by mouth daily at 6 PM.   carvedilol 25 MG tablet Commonly known as:  COREG Take 1 tablet (25 mg total) by mouth 2 (two) times daily with a meal.   ferrous sulfate 325 (65 FE) MG tablet Take 325 mg by mouth daily with breakfast.   furosemide 20 MG tablet Commonly known as:  LASIX Take 1 tablet (20 mg total) by mouth daily. Start taking on:  04/04/2017   gabapentin 300 MG capsule Commonly known as:  NEURONTIN Take 600 mg by mouth at bedtime.   insulin detemir 100 UNIT/ML injection Commonly known as:  LEVEMIR Inject 0.25 mLs (25 Units total) into the skin 2 (two) times daily. Patient uses  sliding scale at home per primary care provider. What changed:    how much to take  additional instructions   isosorbide mononitrate 30 MG 24 hr tablet Commonly known as:  IMDUR Take 1 tablet (30  mg total) by mouth daily. Start taking on:  04/04/2017   multivitamin with minerals Tabs tablet Take 1 tablet by mouth daily.   nitroGLYCERIN 0.4 MG SL tablet Commonly known as:  NITROSTAT Place 1 tablet (0.4 mg total) under the tongue every 5 (five) minutes as needed for chest pain.   pantoprazole 40 MG tablet Commonly known as:  PROTONIX Take 1 tablet (40 mg total) by mouth 2 (two) times daily. What changed:  when to take this   potassium chloride SA 20 MEQ tablet Commonly known as:  K-DUR,KLOR-CON Take 1 tablet (20 mEq total) by mouth 2 (two) times daily.   Saline 0.2 % Soln Place 1 spray into the nose daily as needed (congestion).   ticagrelor 90 MG Tabs tablet Commonly known as:  BRILINTA Take 1 tablet (90 mg total) by mouth 2 (two) times daily.   tiZANidine 4 MG tablet Commonly known as:  ZANAFLEX Take 4 mg by mouth at bedtime as needed for muscle spasms.   VITAMIN D3 PO Take 2,500 Units by mouth daily.        Allergies:  Allergies  Allergen Reactions  . Amoxicillin Other (See Comments)    Yeast infection during treatment  . Hydrocodone Rash    Palpatations, gi upset  . Lisinopril Other (See Comments)    Chest pain, palpatations Other reaction(s): Other - See Comments Chest pain, palpatations  . Meloxicam Nausea Only  . Strawberry Extract Hives    Does not always happen  . Tape Rash    Some tapes cause rashes   Outstanding Labs/Studies   n/a  Duration of Discharge Encounter   Greater than 30 minutes including physician time.  Signed, Charlie Pitter PA-C 04/03/2017, 5:43 PM

## 2017-04-03 NOTE — Progress Notes (Signed)
[name] to be D/C'd home per MD order.  Discussed with the patient and all questions fully answered.  VSS, Skin clean, dry and intact without evidence of skin break down, no evidence of skin tears noted. IV catheter discontinued intact. Site without signs and symptoms of complications. Dressing and pressure applied.  An After Visit Summary was printed and given to the patient. Patient received prescription.  D/c education completed with patient/family including follow up instructions, medication list, d/c activities limitations if indicated, with other d/c instructions as indicated by MD - patient able to verbalize understanding, all questions fully answered.   Patient instructed to return to ED, call 911, or call MD for any changes in condition.   Patient escorted via [place], and D/C home via private auto.  Stephanie Manning 04/03/2017 6:54 PM

## 2017-04-03 NOTE — Progress Notes (Addendum)
Notified by nurse hemoccult obtained and pending. Per their report no evidence of bleeding in stool. Dr. Debara Pickett left signout message with fellow to follow up on this. If normal - just needs discharge order. Everything else is done including med rec, and I will sign DC summary as soon as it becomes available. If abnormal - has CBC ordered for AM; will need to consult GI tomorrow. Ilamae Geng PA-C

## 2017-04-03 NOTE — Progress Notes (Addendum)
Progress Note  Patient Name: Stephanie Manning Date of Encounter: 04/03/2017  Primary Cardiologist:Dr. [name]  Subjective   No chest pain overnight. Noted to have hypoglycemia. Short-acting insulin discontinued - levemir dose has been cut in half.   Inpatient Medications    Scheduled Meds: . aspirin  81 mg Oral Daily  . atorvastatin  80 mg Oral q1800  . carvedilol  25 mg Oral BID WC  . cholecalciferol  2,500 Units Oral Daily  . docusate sodium  100 mg Oral Daily  . ferrous sulfate  325 mg Oral Q breakfast  . furosemide  20 mg Oral Daily  . gabapentin  600 mg Oral QHS  . insulin aspart  0-5 Units Subcutaneous QHS  . insulin aspart  0-9 Units Subcutaneous TID WC  . insulin detemir  20 Units Subcutaneous BID  . isosorbide mononitrate  30 mg Oral Daily  . multivitamin with minerals  1 tablet Oral Daily  . pantoprazole  40 mg Oral BID AC  . potassium chloride SA  20 mEq Oral BID  . sodium chloride flush  3 mL Intravenous Q12H  . ticagrelor  90 mg Oral BID   Continuous Infusions: . sodium chloride     PRN Meds: sodium chloride, acetaminophen, nitroGLYCERIN, ondansetron (ZOFRAN) IV, sodium chloride flush, tiZANidine   Vital Signs    Vitals:   04/02/17 1120 04/02/17 1304 04/02/17 2100 04/03/17 0558  BP: 120/75  134/63 125/60  Pulse:   76 82  Resp:   16 16  Temp: 99.1 F (37.3 C) 99.3 F (37.4 C) 99.6 F (37.6 C) 99.9 F (37.7 C)  TempSrc: Oral Oral Oral Oral  SpO2:   100% 98%  Weight:    239 lb 8 oz (108.6 kg)  Height:        Intake/Output Summary (Last 24 hours) at 04/03/2017 0856 Last data filed at 04/03/2017 0601 Gross per 24 hour  Intake 243 ml  Output 1200 ml  Net -957 ml   Filed Weights   04/01/17 0810 04/02/17 0654 04/03/17 0558  Weight: 237 lb (107.5 kg) 244 lb 4.3 oz (110.8 kg) 239 lb 8 oz (108.6 kg)    Telemetry    SR- Personally Reviewed  ECG    None today   Physical Exam   GEN: No acute distress.   Neck: No JVD Cardiac: RRR, no  murmurs, rubs, or gallops. R radial cath site stable Respiratory: Clear to auscultation bilaterally. GI: Soft, nontender, non-distended  MS: No edema; No deformity. Neuro:  Nonfocal  Psych: Normal affect   Labs    Chemistry Recent Labs  Lab 03/30/17 1530 04/02/17 0244  NA 142 141  K 4.3 4.1  CL 113* 111  CO2 19* 22  GLUCOSE 116* 78  BUN 14 19  CREATININE 1.38* 1.68*  CALCIUM 9.1 8.7*  GFRNONAA 42* 33*  GFRAA 48* 38*  ANIONGAP 10 8     Hematology Recent Labs  Lab 03/30/17 1530 04/02/17 0244  WBC 7.4 7.6  RBC 3.41* 3.03*  HGB 9.1* 7.9*  HCT 29.0* 25.7*  MCV 85.0 84.8  MCH 26.7 26.1  MCHC 31.4 30.7  RDW 14.9 14.6  PLT 248 207    Cardiac Enzymes Recent Labs  Lab 03/30/17 2132  TROPONINI <0.03    Recent Labs  Lab 03/30/17 1602  TROPIPOC 0.00     BNP Recent Labs  Lab 03/30/17 2122  BNP 462.4*     DDimer No results for input(s): DDIMER in the last 168 hours.  Radiology    No results found.  Cardiac Studies   CORONARY STENT INTERVENTION  LEFT HEART CATH AND CORONARY ANGIOGRAPHY  Conclusion     Dist LAD lesion is 100% stenosed.  Previously placed Prox LAD drug eluting stent is widely patent.  Ost 1st Diag to 1st Diag lesion is 90% stenosed.  Prox Cx to Mid Cx lesion is 50% stenosed.  Dist Cx lesion is 90% stenosed.  3rd Mrg lesion is 90% stenosed.  Mid Cx lesion is 85% stenosed.  Prox RCA to Mid RCA lesion is 80% stenosed.  A drug-eluting stent was successfully placed using a STENT SYNERGY DES [id number].  Post intervention, there is a 0% residual stenosis.  LV end diastolic pressure is moderately elevated.   1. 3 vessel obstructive CAD   - patent stent in the proximal to mid LAD. CTO of the distal LAD   - 90% diffuse small first diagonal   - 90% distal LCx/OM   - 80% diffuse proximal RCA 2. Moderately elevated LVEDP- 28 mm Hg 3. Successful stenting of the proximal to mid RCA with DES  Plan: continue DAPT for at least  one year. Will start lasix for elevated LVEDP. Add nitrates for treatment of residual disease-vessels too small for PCI. Anticipate DC in am.     Patient Profile     57 y.o. female with recent admission for acute ST elevation MI of the left anterior descending artery, type 2 diabetes, hypertension, hyperlipidemia, chronic anemia, acute on chronic combined systolic diastolic heart failure and acute renal failure post procedure presented for staged PCI.  The patient was transferred from [facility] in the setting of ST elevated MI with complaints of subscapular pain which began the evening before.  Patient also had elevated proBNP of 2215.  Evidence of pulmonary edema per chest x-ray.  Cardiac catheterization was completed emergently which revealed a 100% LAD occlusion, with successful PCI/DES x1 with persistent occlusion of the distal LAD.  The patient also was found to have severe disease in the distal circumflex, mild diffuse disease in the proximal to mid RCA.  Echocardiogram revealed an LVEF of 30% to 35% with grade 2 diastolic dysfunction.  Subsequently the patient was planned for a staged intervention to her right coronary artery , however, creatinine was elevated.    He was given IV hydration strted arted on carvedilol, ACE inhibitor was not begun due to renal insufficiency.  The patient troponin peaked at 17.88.  Hemoglobin A1c was elevated at 8.5.  On follow-up appointment she was to be scheduled for staged intervention after recovering from MI.  LifeVest was scheduled prior to discharge.  Seen in clinic 03/24/17 and set up for staged PCI.    Assessment & Plan    1. CAD - Cath showed patent pLAD stent. S/p successful stenting of the proximal to mid RCA with DES. Added Imdur for residual disease-vessels too small for PCI. - Continue ASA, statin, Brillinta, Imdue and BB.   2. ICM/Chronic systolic CHF - cath showed elevated LVEDP. Started on low dose lasix. Volume status  stable.  - Continue BB. Not on ACE/ARB due to CKD.   3. AonCKD stage II - Scr elevated to 1.68. Follow closely  4. Anemia  -Hemoglobin gradually trending down. Awaiting CBC result today- do not note a stool guaiac. Will inquire about this.   5. Constipation - No BM - she is eating. Will give miralax today.  6. IDDM - hypoglycemia overnight. Levemir dose reduced. Holding short-acting  insulin. She is eating.  If guaiac negative and hemoglobin stable - will likely be ok for d/c and recommend outpatient work-up for anemia.  Pixie Casino, MD, [facility], [place] Director of the [facility] Lipid Disorders &  Cardiovascular Risk Reduction Clinic Diplomate of the American Board of Clinical Lipidology Attending Cardiologist  Direct Dial: [phone]  Fax: [phone]  Website:  [email or web address]  Pixie Casino, MD  04/03/2017, 8:56 AM

## 2017-04-26 ENCOUNTER — Telehealth: Payer: Self-pay | Admitting: Cardiology

## 2017-04-26 NOTE — Telephone Encounter (Signed)
Returned call to patient, patient states her PCP wants to send her to a kidney specialist and she was wanting Dr. Doug Sou opinion on this and if he thought her kidneys would improve after the cath and procedure or if she should proceed with this.   After chart review-patient had cath and intervention on 1/18, discharged on 1/20, no TOC appointment made.    Patient had labs on 1/21 (avaliable in care everywhere) Cr 1.91 (1.64 at d/c).   Apologized that appt has not been made but advised it would be best to schedule appointment for follow up after recent procedure and intervention.  As plan of care discussion and medication rec could be done at this time, post hospital stay.      Patient agreed, appt made for tomorrow 2/15 with Stephanie Manning.   Patient aware and verbalized understanding.

## 2017-04-26 NOTE — Telephone Encounter (Signed)
Ms. Stephanie Manning is calling to find out how long does it take for here kidneys to respond after her stents were placed .  Also she wants to know should she see a kidney specialist , Her pcp is wanting her to see the kidney specialist . Please call   Thanks

## 2017-04-27 ENCOUNTER — Ambulatory Visit: Payer: Medicare Other | Admitting: Adult Health

## 2017-04-29 NOTE — Telephone Encounter (Signed)
I am not working on Friday 04/29/2017.

## 2017-05-01 NOTE — Progress Notes (Signed)
Cardiology Office Note   Date:  05/03/2017   ID:  Nashya, Garlington 17-May-1960, MRN [id number]  PCP:  [name], [name], MD  Cardiologist:  Dr. [name]  Chief Complaint  Patient presents with  . Hospitalization Follow-up  . Cardiomyopathy  . Coronary Artery Disease     History of Present Illness: Stephanie Manning is a 57 y.o. female who presents for post hospitalization follow-up after admission for angina, non-STEMI with a peak troponin of 17.  With known history of coronary artery disease, type 2 diabetes, essential hypertension, hyperlipidemia, and chronic anemia.  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  During hospitalization, the patient underwent cardiac catheterization on 04/01/2017. Procedures 04/01/17  CORONARY STENT INTERVENTION  LEFT HEART CATH AND CORONARY ANGIOGRAPHY  Conclusion     Dist LAD lesion is 100% stenosed.  Previously placed Prox LAD drug eluting stent is widely patent.  Ost 1st Diag to 1st Diag lesion is 90% stenosed.  Prox Cx to Mid Cx lesion is 50% stenosed.  Dist Cx lesion is 90% stenosed.  3rd Mrg lesion is 90% stenosed.  Mid Cx lesion is 85% stenosed.  Prox RCA to Mid RCA lesion is 80% stenosed.  A drug-eluting stent was successfully placed using a STENT SYNERGY DES [id number].  Post intervention, there is a 0% residual stenosis.  LV end diastolic pressure is moderately elevated.  1. 3 vessel obstructive CAD - patent stent in the proximal to mid LAD. CTO of the distal LAD - 90% diffuse small first diagonal - 90% distal LCx/OM - 80% diffuse proximal RCA 2. Moderately elevated LVEDP- 28 mm Hg 3. Successful stenting of the proximal to mid RCA with DES  Plan: continue DAPT for at least one year. Will start lasix for elevated LVEDP. Add nitrates for treatment of residual disease-vessels too small for PCI.    Due  to reduced ejection fraction noted to be 30-35% with grade 2 diastolic dysfunction, the patient qualified for LifeVest at discharge.  She was also noted to have acute kidney injury with creatinine up to 2, with hypokalemia.  She was also noted to have anemia.  She has a history of gastric ulcer and is followed by gastroenterology.  Diabetic medications were adjusted, it was found that she was on 2 different sliding scale as per her PCP on both NovoLog and Levemir.  She was to continue Levemir at 25 units twice daily, and follow-up with PCP as soon as possible.  The patient's discharge weight was 239 pounds with a creatinine 1.64.  The patient will need a follow-up BMET and CBC at this office visit.  Patient comes today with multiple questions. She continues to have some mild dyspnea but no recurrent chest discomfort. She has not been as active and wants to start going to [place] She has not yet participated in cardiac rehabilitation. He denies recurrent chest pain dizziness or fatigue. She denies any hemoptysis or melena or excessive bruising. She is medically compliant.   Past Medical History:  Diagnosis Date  . AKI (acute kidney injury) ([place]) 02/2017  . Anemia   . CAD in native artery    a. NSTEMI 02/2017 s/p DES to mLAD, diffuse disease otherwise, EF 30-35%. b. Staged cath 03/2017 - patent stent in the proximal to mid LAD. CTO of the distal LAD, 90% diffuse small first diagonal, 90% distal LCx/OM, 80% diffuse prox RCA, moderately elevated LVEDP 56mmHg, s/p DES to  mRCA, residual disease treated medically (too small for PCI), EF 30-35%.  . Chronic combined systolic and diastolic CHF (congestive heart failure) ([place])   . CKD (chronic kidney disease), stage III ([place])   . Diabetes mellitus, type II, insulin dependent ([place])   . Hyperlipidemia   . Hypertension   . Ischemic cardiomyopathy     Past Surgical History:  Procedure Laterality Date  . BILATERAL OOPHORECTOMY    . CESAREAN SECTION    .  CORONARY STENT INTERVENTION N/A 04/01/2017   Procedure: CORONARY STENT INTERVENTION;  Surgeon: [name], [name], MD;  Location: [facility];  Service: Cardiovascular;  Laterality: N/A;  . CORONARY STENT PLACEMENT  04/01/2017   drug-eluting stent was successfully placed using a STENT SYNERGY DES [id number]  . CORONARY/GRAFT ACUTE MI REVASCULARIZATION N/A 03/10/2017   Procedure: Coronary/Graft Acute MI Revascularization;  Surgeon: [name], [name], MD;  Location: [facility];  Service: Cardiovascular;  Laterality: N/A;  . LEFT HEART CATH AND CORONARY ANGIOGRAPHY N/A 03/10/2017   Procedure: LEFT HEART CATH AND CORONARY ANGIOGRAPHY;  Surgeon: [name], [name], MD;  Location: [facility];  Service: Cardiovascular;  Laterality: N/A;  . LEFT HEART CATH AND CORONARY ANGIOGRAPHY N/A 04/01/2017   Procedure: LEFT HEART CATH AND CORONARY ANGIOGRAPHY;  Surgeon: [name], [name], MD;  Location: [facility];  Service: Cardiovascular;  Laterality: N/A;     Current Outpatient Medications  Medication Sig Dispense Refill  . aspirin 81 MG chewable tablet Chew 1 tablet (81 mg total) by mouth daily.    Marland Kitchen atorvastatin (LIPITOR) 80 MG tablet Take 1 tablet (80 mg total) by mouth daily at 6 PM. 90 tablet 1  . carvedilol (COREG) 25 MG tablet Take 1 tablet (25 mg total) by mouth 2 (two) times daily with a meal. 30 tablet 3  . Cholecalciferol (VITAMIN D3 PO) Take 2,500 Units by mouth daily.    . ferrous sulfate 325 (65 FE) MG tablet Take 325 mg by mouth daily with breakfast.    . furosemide (LASIX) 20 MG tablet Take 1 tablet (20 mg total) by mouth daily. 30 tablet 1  . gabapentin (NEURONTIN) 300 MG capsule Take 600 mg by mouth at bedtime.    . insulin detemir (LEVEMIR) 100 UNIT/ML injection Inject 0.25 mLs (25 Units total) into the skin 2 (two) times daily. Patient uses sliding scale at home per primary care provider.    . isosorbide mononitrate (IMDUR) 30 MG 24 hr tablet Take 1 tablet (30 mg total) by  mouth daily. 30 tablet 6  . Multiple Vitamin (MULTIVITAMIN WITH MINERALS) TABS tablet Take 1 tablet by mouth daily.    . nitroGLYCERIN (NITROSTAT) 0.4 MG SL tablet Place 1 tablet (0.4 mg total) under the tongue every 5 (five) minutes as needed for chest pain. 25 tablet 2  . pantoprazole (PROTONIX) 40 MG tablet Take 1 tablet (40 mg total) by mouth 2 (two) times daily. 60 tablet 1  . potassium chloride SA (K-DUR,KLOR-CON) 20 MEQ tablet Take 1 tablet (20 mEq total) by mouth 2 (two) times daily. 60 tablet 1  . Saline 0.2 % SOLN Place 1 spray into the nose daily as needed (congestion).    . ticagrelor (BRILINTA) 90 MG TABS tablet Take 1 tablet (90 mg total) by mouth 2 (two) times daily. 180 tablet 2  . tiZANidine (ZANAFLEX) 4 MG tablet Take 4 mg by mouth at bedtime as needed for muscle spasms.     No current facility-administered medications for  this visit.     Allergies:   Amoxicillin; Hydrocodone; Lisinopril; Meloxicam; Strawberry extract; and Tape    Social History:  The patient  reports that  has never smoked. she has never used smokeless tobacco. She reports that she does not drink alcohol or use drugs.   Family History:  The patient's family history includes Diabetes in her mother; Heart disease in her mother; Hypertension in her mother; Stroke in her mother.    ROS: All other systems are reviewed and negative. Unless otherwise mentioned in H&P     PHYSICAL EXAM: VS:  BP 126/70   Pulse 75   Ht 5' 8.5" (1.74 m)   Wt 249 lb 3.2 oz (113 kg)   BMI 37.34 kg/m  , BMI Body mass index is 37.34 kg/m. GEN: Well nourished, well developed, in no acute distress  HEENT: normal  Neck: no JVD, carotid bruits, or masses Cardiac: RRR; distant heart sounds no murmurs, rubs, or gallops,NO  edema  Respiratory:  clear to auscultation bilaterally, normal work of breathing GI: soft, nontender, nondistended, + BS MS: no deformity or atrophy  Skin: warm and dry, no rash Neuro:  Strength and sensation  are intact Psych: euthymic mood, full affect   EKG: Normal sinus rhythm evidence of septal infarct, T wave abnormality noted inversion in the lateral leads and  T-wave inversion in 23 and T-wave flattening in aVF, also T-wave inversion in aVL.   Recent Labs: 03/13/2017: Magnesium 1.9 03/15/2017: ALT 22 03/30/2017: B Natriuretic Peptide 462.4 04/03/2017: BUN 19; Creatinine, Ser 1.64; Hemoglobin 8.3; Platelets 194; Potassium 4.4; Sodium 137    Lipid Panel    Component Value Date/Time   CHOL 204 (H) 03/10/2017 1227   TRIG 41 03/10/2017 1227   HDL 47 03/10/2017 1227   CHOLHDL 4.3 03/10/2017 1227   VLDL 8 03/10/2017 1227   LDLCALC 149 (H) 03/10/2017 1227      Wt Readings from Last 3 Encounters:  05/03/17 249 lb 3.2 oz (113 kg)  04/03/17 239 lb 8 oz (108.6 kg)  03/24/17 246 lb (111.6 kg)      Other studies Reviewed: Echocardiogram 03/28/2017 Left ventricle: The cavity size was normal. There was moderate   concentric hypertrophy. Systolic function was moderately to   severely reduced. The estimated ejection fraction was in the   range of 30% to 35%. Features are consistent with a pseudonormal   left ventricular filling pattern, with concomitant abnormal   relaxation and increased filling pressure (grade 2 diastolic   dysfunction). Doppler parameters are consistent with elevated   ventricular end-diastolic filling pressure. - Aortic valve: Trileaflet; normal thickness leaflets. There was no   regurgitation. - Mitral valve: There was mild regurgitation. - Right ventricle: The cavity size was normal. Wall thickness was   normal. Systolic function was normal. - Tricuspid valve: There was mild regurgitation. - Pulmonary arteries: The main pulmonary artery was normal-sized.   Systolic pressure was within the normal range. - Inferior vena cava: The vessel was normal in size. - Pericardium, extracardiac: There was no pericardial effusion.  Impressions:  - There is akinesis of  the mid anteroseptal, anterior and apical   anterior, septal and inferior walls. No thrombus is seen on the   Definity echocontrast images.   ASSESSMENT AND PLAN:  1. Coronary artery disease: Status post drug-eluting stent to the LAD, end-stage drug-eluting stent to the right coronary artery. Continues on dual platelet therapy. She denies any side effects from this. She is still complaining of some  mild shortness of breath not with exertion, but all sitting stating that she sometimes has to make her stop taking deep breaths. She comes multiple questions which have been answered. She has been referred to cardiac rehabilitation to assist with conditioning and ongoing monitoring physical activity with blood pressure and heart rate response. I've asked her not go to Zumba class as she cannot be monitored during that exercise.  2. Ischemic cardiomyopathy: Most recent echocardiogram revealed EF of 30-35%. She was a candidate for [employer] but has refused this. I will repeat echocardiogram at the end of March 2019 for evaluation of LV function recovery. For now she will continue on current medication regimen. She is not yet on Lasix. She had elevated creatinine at 1.6. I'm repeating her BMET along with a CBC this office visit. I see no evidence of decompensation with fluid retention on exam today. Her weight has been stable.  3. Anemia: CBC is being repeated for ongoing evaluation.  4. Hyperlipidemia: Continue statin therapy.  5. Diabetes mellitus type 2: Continue management by PCP.  Current medicines are reviewed at length with the patient today.  I have spent greater than 45 minutes with this patient answering questions and providing explanations of treatment plan.   Labs/ tests ordered today include: CBC, BMET echo in 2 months  Phill Myron. West Pugh, ANP, AACC   05/03/2017 12:35 PM    [facility] [address], [place], [place] [address] Phone: [phone]; Fax:  [phone]

## 2017-05-03 ENCOUNTER — Encounter: Payer: Self-pay | Admitting: Adult Health

## 2017-05-03 ENCOUNTER — Ambulatory Visit (INDEPENDENT_AMBULATORY_CARE_PROVIDER_SITE_OTHER): Payer: Medicare Other | Admitting: Adult Health

## 2017-05-03 VITALS — BP 126/70 | HR 75 | Ht 68.5 in | Wt 249.2 lb

## 2017-05-03 DIAGNOSIS — I2510 Atherosclerotic heart disease of native coronary artery without angina pectoris: Secondary | ICD-10-CM | POA: Diagnosis not present

## 2017-05-03 DIAGNOSIS — E7800 Pure hypercholesterolemia, unspecified: Secondary | ICD-10-CM

## 2017-05-03 DIAGNOSIS — Z79899 Other long term (current) drug therapy: Secondary | ICD-10-CM

## 2017-05-03 DIAGNOSIS — I5041 Acute combined systolic (congestive) and diastolic (congestive) heart failure: Secondary | ICD-10-CM

## 2017-05-03 DIAGNOSIS — I10 Essential (primary) hypertension: Secondary | ICD-10-CM

## 2017-05-03 NOTE — Patient Instructions (Addendum)
Medication Instructions:  NO CHANGES-Your physician recommends that you continue on your current medications as directed. Please refer to the Current Medication list given to you today.  If you need a refill on your cardiac medications before your next appointment, please call your pharmacy.  Labwork: CBC AND BMET TODAY HERE IN OUR OFFICE AT [facility]  Testing/Procedures: Echocardiogram - Your physician has requested that you have an echocardiogram. SCHEDULE AT THE END OF MARCH. Echocardiography is a painless test that uses sound waves to create images of your heart. It provides your doctor with information about the size and shape of your heart and how well your heart's chambers and valves are working. This procedure takes approximately one hour. There are no restrictions for this procedure. This will be performed at our [facility] location - [address].   Special Instructions: WE WILL FORWARD NOTE-OK TO START CARDIAC REHAB TO [phone]  Follow-Up: Your physician wants you to follow-up: AFTER ECHO WITH DR [name] -OR- [name], DNP.    Thank you for choosing [facility] at [facility]!!

## 2017-05-03 NOTE — Telephone Encounter (Signed)
Appt was 2/13-provider out sick.   Patient has been rescheduled.

## 2017-05-04 LAB — BASIC METABOLIC PANEL
BUN/Creatinine Ratio: 16 (ref 9–23)
BUN: 23 mg/dL (ref 6–24)
CALCIUM: 9 mg/dL (ref 8.7–10.2)
CHLORIDE: 106 mmol/L (ref 96–106)
CO2: 19 mmol/L — AB (ref 20–29)
Creatinine, Ser: 1.48 mg/dL — ABNORMAL HIGH (ref 0.57–1.00)
GFR calc Af Amer: 45 mL/min/{1.73_m2} — ABNORMAL LOW (ref >59)
GFR calc non Af Amer: 39 mL/min/{1.73_m2} — ABNORMAL LOW (ref >59)
GLUCOSE: 165 mg/dL — AB (ref 65–99)
Potassium: 4.7 mmol/L (ref 3.5–5.2)
Sodium: 140 mmol/L (ref 134–144)

## 2017-05-04 LAB — CBC
Hematocrit: 25.6 % — ABNORMAL LOW (ref 34.0–46.6)
Hemoglobin: 8.1 g/dL — ABNORMAL LOW (ref 11.1–15.9)
MCH: 26.6 pg (ref 26.6–33.0)
MCHC: 31.6 g/dL (ref 31.5–35.7)
MCV: 84 fL (ref 79–97)
PLATELETS: 222 10*3/uL (ref 150–379)
RBC: 3.05 x10E6/uL — AB (ref 3.77–5.28)
RDW: 15.3 % (ref 12.3–15.4)
WBC: 7.6 10*3/uL (ref 3.4–10.8)

## 2017-05-05 ENCOUNTER — Ambulatory Visit: Payer: Medicare Other | Admitting: Physician Assistant

## 2017-05-05 ENCOUNTER — Ambulatory Visit: Payer: Medicare Other | Admitting: Adult Health

## 2017-05-05 ENCOUNTER — Telehealth: Payer: Self-pay

## 2017-05-05 NOTE — Telephone Encounter (Signed)
Pt called back and reiterated lab instructions below. She states that Dr Purcell Nails said that I do not need to go to nephrology right now. Informed pt to ask PCP and she states that she does not want to she wants to know what Dr Purcell Nails want to do. Please advise. Notes recorded by Lendon Colonel, NP on 05/04/2017 at 8:04 AM EST Still continues to be anemic, Hgb of 8.1/Hct 25.6/ She will need to follow up with PCP or nephrology. Creatinine, her kidney function, is slightly improved.

## 2017-05-07 NOTE — Telephone Encounter (Signed)
Please let her know based upon her labs, she will need to follow up with PCP AND will likely be referred to nephrology. I think it is best that Nephrology weigh in now.

## 2017-05-09 NOTE — Telephone Encounter (Signed)
[id number] 

## 2017-05-09 NOTE — Telephone Encounter (Signed)
PT NOTIFIED  

## 2017-05-17 DIAGNOSIS — M4810 Ankylosing hyperostosis [Forestier], site unspecified: Secondary | ICD-10-CM | POA: Insufficient documentation

## 2017-05-17 DIAGNOSIS — IMO0002 Reserved for concepts with insufficient information to code with codable children: Secondary | ICD-10-CM | POA: Insufficient documentation

## 2017-05-24 ENCOUNTER — Other Ambulatory Visit: Payer: Self-pay | Admitting: Cardiology

## 2017-06-03 ENCOUNTER — Telehealth: Payer: Self-pay | Admitting: Cardiology

## 2017-06-03 NOTE — Telephone Encounter (Signed)
Returned call to patient. She reports her meds were changed after her hospitalization. She states she is doing well on her current meds. She states her pharmacy is questioning this change. Advised will call [facility] to clarify

## 2017-06-03 NOTE — Telephone Encounter (Signed)
[facility]. They state another prescriber sent in Rx for enalapril so they wanted to clarify if she should/should not be taking. Explained this was stopped at hospital discharge in Jan.

## 2017-06-03 NOTE — Telephone Encounter (Signed)
New Message:    Pt at pharmacy wanting to know if she still needs to get  Enalapril 10mg . Pt states the last time she was here to see [place] they discontinued but pt not sure on what to do.

## 2017-06-07 ENCOUNTER — Ambulatory Visit (HOSPITAL_COMMUNITY): Payer: Medicare Other | Attending: Cardiology

## 2017-06-07 ENCOUNTER — Other Ambulatory Visit: Payer: Self-pay

## 2017-06-07 DIAGNOSIS — I2510 Atherosclerotic heart disease of native coronary artery without angina pectoris: Secondary | ICD-10-CM | POA: Diagnosis present

## 2017-06-07 DIAGNOSIS — I5041 Acute combined systolic (congestive) and diastolic (congestive) heart failure: Secondary | ICD-10-CM

## 2017-06-07 DIAGNOSIS — I420 Dilated cardiomyopathy: Secondary | ICD-10-CM | POA: Insufficient documentation

## 2017-06-07 MED ORDER — PERFLUTREN LIPID MICROSPHERE
1.0000 mL | INTRAVENOUS | Status: AC | PRN
  Administered 2017-06-07: 2 mL via INTRAVENOUS

## 2017-06-13 NOTE — Progress Notes (Signed)
Cardiology Office Note   Date:  06/14/2017   ID:  Zamzam, Whinery 22-Dec-1960, MRN [id number]  PCP:  [name], MD  Cardiologist: Dr. [name] Chief Complaint  Patient presents with  . Cardiomyopathy  . Congestive Heart Failure  . Coronary Artery Disease  . Hypertension     History of Present Illness: Stephanie Manning is a 57 y.o. female who presents for ongoing assessment and management of coronary artery disease, recent hospitalization in January 2019 cardiac catheterization completed in the setting of a non-STEMI.  The patient was found to have multivessel disease, proximal to mid RCA lesion of 80%, status post drug-eluting stent placed.  The patient had a patent stent to the proximal LAD and chronic total occlusion of the distal LAD.  The patient was to continue DAPT for 1 year, Lasix was restarted, and nitrates were added for treatment of residual disease in vessels too small for PCI.  On last office visit on 05/03/2017 echocardiogram was repeated, as most recent evaluation of LVEF was 30-35%.  She was on appropriate medications but has not started taking Lasix yet.  Creatinine was elevated at 1.6.  Due to history of anemia we repeated CBC.  Echocardiogram: 06/07/2017  Transthoracic echocardiography. Image   quality was suboptimal. The study was technically difficult.   Intravenous contrast (Definity) was administered. - Left ventricle: The cavity size was mildly dilated. Systolic   function was moderately to severely reduced. The estimated   ejection fraction was in the range of 30% to 35%. Akinesis and   scarring of the mid-apicalanteroseptal, anterior, and apical   myocardium; consistent with infarction in the distribution of the   left anterior descending coronary artery. Severe hypokinesis of   the inferolateral myocardium; consistent with ischemia in the   distribution of the left circumflex coronary artery. Features are   consistent with a pseudonormal left  ventricular filling pattern,   with concomitant abnormal relaxation and increased filling   pressure (grade 2 diastolic dysfunction). Acoustic contrast   opacification revealed no evidence ofthrombus. - Left atrium: The atrium was mildly dilated. - Right ventricle: The cavity size was mildly dilated.  Labs: Hemoglobin 8.1, hematocrit 25.6, white blood cell 7.6, platelets 222.  This is similar to previous CBC on 04/03/2017.  She is here today feeling about the same.  She had one episode of an irregular heart rate about 3 weeks ago.  This was not associated with dizziness or chest discomfort.  Blood pressures not well controlled today.  She denies chest pain, dizziness, or palpitations.  She states that she is avoiding salt and taking medications as directed.  She continues to be in make and has had iron infusions completed via hematology.  Last infusion was on May 29, 2017 with follow-up visit with them again on June 29, 2017.  Past Medical History:  Diagnosis Date  . AKI (acute kidney injury) ([place]) 02/2017  . Anemia   . CAD in native artery    a. NSTEMI 02/2017 s/p DES to mLAD, diffuse disease otherwise, EF 30-35%. b. Staged cath 03/2017 - patent stent in the proximal to mid LAD. CTO of the distal LAD, 90% diffuse small first diagonal, 90% distal LCx/OM, 80% diffuse prox RCA, moderately elevated LVEDP 39mmHg, s/p DES to mRCA, residual disease treated medically (too small for PCI), EF 30-35%.  . Chronic combined systolic and diastolic CHF (congestive heart failure) ([place])   . CKD (chronic kidney disease), stage III ([place])   . Diabetes mellitus, type  II, insulin dependent ([place])   . Hyperlipidemia   . Hypertension   . Ischemic cardiomyopathy     Past Surgical History:  Procedure Laterality Date  . BILATERAL OOPHORECTOMY    . CESAREAN SECTION    . CORONARY STENT INTERVENTION N/A 04/01/2017   Procedure: CORONARY STENT INTERVENTION;  Surgeon: [name], [name], MD;  Location: [facility];   Service: Cardiovascular;  Laterality: N/A;  . CORONARY STENT PLACEMENT  04/01/2017   drug-eluting stent was successfully placed using a STENT SYNERGY DES 1.60F09  . CORONARY/GRAFT ACUTE MI REVASCULARIZATION N/A 03/10/2017   Procedure: Coronary/Graft Acute MI Revascularization;  Surgeon: [name], [name], MD;  Location: [facility];  Service: Cardiovascular;  Laterality: N/A;  . LEFT HEART CATH AND CORONARY ANGIOGRAPHY N/A 03/10/2017   Procedure: LEFT HEART CATH AND CORONARY ANGIOGRAPHY;  Surgeon: [name], [name], MD;  Location: [facility];  Service: Cardiovascular;  Laterality: N/A;  . LEFT HEART CATH AND CORONARY ANGIOGRAPHY N/A 04/01/2017   Procedure: LEFT HEART CATH AND CORONARY ANGIOGRAPHY;  Surgeon: [name], [name], MD;  Location: [facility];  Service: Cardiovascular;  Laterality: N/A;     Current Outpatient Medications  Medication Sig Dispense Refill  . aspirin 81 MG chewable tablet Chew 1 tablet (81 mg total) by mouth daily.    Marland Kitchen atorvastatin (LIPITOR) 80 MG tablet Take 1 tablet (80 mg total) by mouth daily at 6 PM. 90 tablet 1  . carvedilol (COREG) 25 MG tablet Take 1 tablet (25 mg total) by mouth 2 (two) times daily with a meal. 180 tablet 1  . Cholecalciferol (VITAMIN D3 PO) Take 2,500 Units by mouth daily.    Marland Kitchen gabapentin (NEURONTIN) 300 MG capsule Take 600 mg by mouth at bedtime.    . insulin detemir (LEVEMIR) 100 UNIT/ML injection Inject 0.25 mLs (25 Units total) into the skin 2 (two) times daily. Patient uses sliding scale at home per primary care provider.    . isosorbide mononitrate (IMDUR) 30 MG 24 hr tablet Take 1 tablet (30 mg total) by mouth daily. 30 tablet 6  . Multiple Vitamin (MULTIVITAMIN WITH MINERALS) TABS tablet Take 1 tablet by mouth daily.    . nitroGLYCERIN (NITROSTAT) 0.4 MG SL tablet Place 1 tablet (0.4 mg total) under the tongue every 5 (five) minutes as needed for chest pain. 25 tablet 2  . pantoprazole (PROTONIX) 40 MG tablet Take 1  tablet (40 mg total) by mouth 2 (two) times daily. 180 tablet 1  . potassium chloride SA (K-DUR,KLOR-CON) 20 MEQ tablet Take 1 tablet (20 mEq total) by mouth 2 (two) times daily. 180 tablet 1  . Saline 0.2 % SOLN Place 1 spray into the nose daily as needed (congestion).    . ticagrelor (BRILINTA) 90 MG TABS tablet Take 1 tablet (90 mg total) by mouth 2 (two) times daily. 180 tablet 2  . tiZANidine (ZANAFLEX) 4 MG tablet Take 4 mg by mouth at bedtime as needed for muscle spasms.    . furosemide (LASIX) 20 MG tablet Take 1 tablet (20 mg total) by mouth daily. 30 tablet 1  . sacubitril-valsartan (ENTRESTO) 24-26 MG Take 1 tablet by mouth 2 (two) times daily. 180 tablet 1   No current facility-administered medications for this visit.     Allergies:   Amoxicillin; Hydrocodone; Lisinopril; Meloxicam; Strawberry extract; and Tape    Social History:  The patient  reports that she has never smoked. She has never used smokeless tobacco. She reports  that she does not drink alcohol or use drugs.   Family History:  The patient's family history includes Diabetes in her mother; Heart disease in her mother; Hypertension in her mother; Stroke in her mother.    ROS: All other systems are reviewed and negative. Unless otherwise mentioned in H&P    PHYSICAL EXAM: VS:  BP 132/70   Pulse 76   Ht 5' 8.5" (1.74 m)   Wt 241 lb (109.3 kg)   SpO2 98%   BMI 36.11 kg/m  , BMI Body mass index is 36.11 kg/m. GEN: Well nourished, well developed, in no acute distress  HEENT: normal  Neck: no JVD, carotid bruits, or masses Cardiac: RRR; soft systolic murmur, rubs, or gallops,no edema  Respiratory:  clear to auscultation bilaterally, normal work of breathing GI: soft, nontender, nondistended, + BS MS: no deformity or atrophy  Skin: warm and dry, no rash Neuro:  Strength and sensation are intact Psych: euthymic mood, full affect   EKG: Not completed this office visit.  Recent Labs: 03/13/2017: Magnesium  1.9 03/15/2017: ALT 22 03/30/2017: B Natriuretic Peptide 462.4 05/03/2017: BUN 23; Creatinine, Ser 1.48; Hemoglobin 8.1; Platelets 222; Potassium 4.7; Sodium 140    Lipid Panel    Component Value Date/Time   CHOL 204 (H) 03/10/2017 1227   TRIG 41 03/10/2017 1227   HDL 47 03/10/2017 1227   CHOLHDL 4.3 03/10/2017 1227   VLDL 8 03/10/2017 1227   LDLCALC 149 (H) 03/10/2017 1227      Wt Readings from Last 3 Encounters:  06/14/17 241 lb (109.3 kg)  05/03/17 249 lb 3.2 oz (113 kg)  04/03/17 239 lb 8 oz (108.6 kg)    Other studies Reviewed: Conclusion     Prox RCA to Mid RCA lesion is 80% stenosed.  Dist LAD lesion is 100% stenosed.  Prox LAD lesion is 100% stenosed.  Prox Cx to Mid Cx lesion is 50% stenosed.  Mid Cx lesion is 85% stenosed.  Dist Cx lesion is 90% stenosed.  3rd Mrg lesion is 90% stenosed.  A drug-eluting stent was successfully placed using a STENT SYNERGY DES 3X32.  Post intervention, there is a 0% residual stenosis.  LV end diastolic pressure is mildly elevated.   1. 3 vessel obstructive CAD    - 100% mid LAD occlusion - this is the culprit lesion    - severe disease in distal LCx/OM3. This area is too small for revascularization    - diffuse 80% proximal to mid RCA 2. Mildly elevated LVEDP 3. Successful PCI with stenting of the mid LAD with DES. Persistent total occlusion of the the distal LAD  Plan: DAPT for one year. IV Aggrastat for 18 hours. BP control with IV Ntg. Assess LV function with Echo. Would consider PCI of the RCA prior to DC home.     ASSESSMENT AND PLAN:  1.  Ischemic cardiomyopathy: Reduced systolic function with a EF of 30-35%.  The patient has had repeat echocardiogram which did not show any improvement since December 2018.  She refuses LifeVest.  Blood pressure is not controlled on this office visit.  I plan on starting Entresto 24/26 mg to her regimen.    She will continue on carvedilol at maximum dose of 25 mg twice a day,  she should continue Lasix 20 mg daily with new prescription provided.  She should also continue isosorbide mononitrate.  I plan on sending her to advanced heart failure clinic for ongoing management and recommendations.  2.  Coronary artery  disease: Successful PCI with stenting of the mid LAD with DES. Persistent total occlusion of the the distal LAD.  She continues on dual antiplatelet therapy.  Continue statin therapy.  3.  Insulin-dependent diabetes: Followed by PCP  Current medicines are reviewed at length with the patient today.    Labs/ tests ordered today include: None today.  Will need follow-up BMET after Entresto on board for 2 weeks.  Return to see her back for her response to medications.  Phill Myron. West Pugh, ANP, AACC   06/14/2017 4:36 PM    [facility] [address], [place], [place] [address] Phone: [phone]; Fax: [phone]

## 2017-06-14 ENCOUNTER — Encounter: Payer: Self-pay | Admitting: Adult Health

## 2017-06-14 ENCOUNTER — Ambulatory Visit (INDEPENDENT_AMBULATORY_CARE_PROVIDER_SITE_OTHER): Payer: Medicare Other | Admitting: Adult Health

## 2017-06-14 VITALS — BP 132/70 | HR 76 | Ht 68.5 in | Wt 241.0 lb

## 2017-06-14 DIAGNOSIS — I5022 Chronic systolic (congestive) heart failure: Secondary | ICD-10-CM

## 2017-06-14 DIAGNOSIS — I43 Cardiomyopathy in diseases classified elsewhere: Secondary | ICD-10-CM | POA: Diagnosis not present

## 2017-06-14 DIAGNOSIS — I2510 Atherosclerotic heart disease of native coronary artery without angina pectoris: Secondary | ICD-10-CM

## 2017-06-14 DIAGNOSIS — E7800 Pure hypercholesterolemia, unspecified: Secondary | ICD-10-CM

## 2017-06-14 MED ORDER — PANTOPRAZOLE SODIUM 40 MG PO TBEC: 40.0000 mg | DELAYED_RELEASE_TABLET | Freq: Two times a day (BID) | ORAL | 1 refills | Status: DC

## 2017-06-14 MED ORDER — SACUBITRIL-VALSARTAN 24-26 MG PO TABS: 1.0000 | ORAL_TABLET | Freq: Two times a day (BID) | ORAL | 1 refills | Status: DC

## 2017-06-14 MED ORDER — POTASSIUM CHLORIDE CRYS ER 20 MEQ PO TBCR: 20.0000 meq | EXTENDED_RELEASE_TABLET | Freq: Two times a day (BID) | ORAL | 1 refills | Status: DC

## 2017-06-14 MED ORDER — FUROSEMIDE 20 MG PO TABS: 20.0000 mg | ORAL_TABLET | Freq: Every day | ORAL | 1 refills | Status: DC

## 2017-06-14 MED ORDER — ATORVASTATIN CALCIUM 80 MG PO TABS: 80.0000 mg | ORAL_TABLET | Freq: Every day | ORAL | 1 refills | Status: DC

## 2017-06-14 MED ORDER — CARVEDILOL 25 MG PO TABS: 25.0000 mg | ORAL_TABLET | Freq: Two times a day (BID) | ORAL | 1 refills | Status: DC

## 2017-06-14 NOTE — Patient Instructions (Signed)
Medication Instructions:  Start Stephanie Manning 24-26 twice daily  If you need a refill on your cardiac medications before your next appointment, please call your pharmacy.  Special Instructions: Refer to advanced heart failure clinic  Follow-Up: Your physician wants you to follow-up in: 1 month with Dr [name] -OR- [name] ([place]), DNP.    Thank you for choosing [facility] at [facility]!!

## 2017-06-17 ENCOUNTER — Other Ambulatory Visit: Payer: Self-pay | Admitting: Adult Health

## 2017-06-17 MED ORDER — SACUBITRIL-VALSARTAN 24-26 MG PO TABS: 1.0000 | ORAL_TABLET | Freq: Two times a day (BID) | ORAL | 5 refills | Status: DC

## 2017-06-17 NOTE — Telephone Encounter (Signed)
Rx(s) sent to pharmacy electronically.  

## 2017-06-17 NOTE — Telephone Encounter (Signed)
New Message:     *STAT* If patient is at the pharmacy, call can be transferred to refill team.   1. Which medications need to be refilled? (please list name of each medication and dose if known) sacubitril-valsartan (ENTRESTO) 24-26 MG  2. Which pharmacy/location (including street and city if local pharmacy) is medication to be sent to? [facility] [place], [place]  3. Do they need a 30 day or 90 day supply? [place]

## 2017-06-24 ENCOUNTER — Telehealth: Payer: Self-pay | Admitting: Adult Health

## 2017-06-24 NOTE — Telephone Encounter (Signed)
Returned call to patient, patient reports she has been having episodes of low BP since started Belk.  Reports she started [facility] 4/6.   She has had 2 episodes of low BP and felt as if she was going to pass out.  She becomes lightheaded and dizzy and fells like she is going to pass out.  BP was 89/43 and 86/49.   She had episode last night and called EMS, states she elevated her legs and her symptoms improved before EMS arrived therefore did not go to the hospital.   BP readings: 108/57, 110/58, 103/59, 117/62, 129/72, HR 70s.  She states she is not having any other side effects.     Advised I would route to pharmD/MD for review/recommendations.  Patient aware and verbalized understanding.

## 2017-06-24 NOTE — Telephone Encounter (Signed)
Patient taking   Carvedilol 25mg  twice daily  Entresto 24/26  Furosemide 20mg   Isosorbide monotitrate 30mg   Recommendation:  1. Hold Entresto dose tonight 2. Decrease carvedilol dose to 12.5mg  twice daily 3. Resume Entresto 24/26 twice daily tomorrow 4/13 4. Call back if symptoms continue

## 2017-06-24 NOTE — Telephone Encounter (Signed)
New Message  Pt c/o medication issue:  1. Name of Medication: sacubitril-valsartan (ENTRESTO) 24-26 MG  2. How are you currently taking this medication (dosage and times per day)? Take 1 tablet by mouth 2 (two) times daily  3. Are you having a reaction (difficulty breathing--STAT)? yes  4. What is your medication issue? Pt states that whenever sh takes the Entresto  her bp drops down low, 98/43 and 86/49. Please call

## 2017-07-25 NOTE — Progress Notes (Signed)
Cardiology Office Note   Date:  07/28/2017   ID:  Stephanie Manning, [name] 1960/10/31, MRN [id number]  PCP:  [name], MD  Cardiologist: Dr. [name] Chief Complaint  Patient presents with  . Follow-up  . Congestive Heart Failure     History of Present Illness: Stephanie Manning is a 57 y.o. female who presents for follow up  of coronary artery disease and ischemic CM. She was hospitalized in January 2019 with a non-STEMI.  The patient was found to have multivessel disease, proximal to mid RCA lesion of 80%- DES placed.  The patient had a patent stent to the proximal LAD and chronic total occlusion of the distal LAD.  Recommended to continue DAPT for 1 year, Lasix was restarted, and nitrates were added for treatment of residual disease.  On last visit echocardiogram was repeated, as most recent evaluation of LVEF was 30-35%.  She was started on Entresto. In April dose reduced due to hypotension. Coreg reduced from 25 to 18.75 mg tid. She is followed by primary care and hematology for anemia. Has recently received 2 iron infusions. Still notes some SOB. No edema. Weight stable.   She quit wearing Lifevest. Is scheduled to be seen in Heart failure clinic next week. No chest pain. No palpitations.   Past Medical History:  Diagnosis Date  . AKI (acute kidney injury) ([place]) 02/2017  . Anemia   . CAD in native artery    a. NSTEMI 02/2017 s/p DES to mLAD, diffuse disease otherwise, EF 30-35%. b. Staged cath 03/2017 - patent stent in the proximal to mid LAD. CTO of the distal LAD, 90% diffuse small first diagonal, 90% distal LCx/OM, 80% diffuse prox RCA, moderately elevated LVEDP 65mmHg, s/p DES to mRCA, residual disease treated medically (too small for PCI), EF 30-35%.  . Chronic combined systolic and diastolic CHF (congestive heart failure) ([place])   . CKD (chronic kidney disease), stage III ([place])   . Diabetes mellitus, type II, insulin dependent ([place])   . Hyperlipidemia   . Hypertension    . Ischemic cardiomyopathy     Past Surgical History:  Procedure Laterality Date  . BILATERAL OOPHORECTOMY    . CESAREAN SECTION    . CORONARY STENT INTERVENTION N/A 04/01/2017   Procedure: CORONARY STENT INTERVENTION;  Surgeon: [name], Council Munguia M, MD;  Location: [facility];  Service: Cardiovascular;  Laterality: N/A;  . CORONARY STENT PLACEMENT  04/01/2017   drug-eluting stent was successfully placed using a STENT SYNERGY DES 9.32I71  . CORONARY/GRAFT ACUTE MI REVASCULARIZATION N/A 03/10/2017   Procedure: Coronary/Graft Acute MI Revascularization;  Surgeon: [name], Nathalie Cavendish M, MD;  Location: [place] [facility];  Service: Cardiovascular;  Laterality: N/A;  . LEFT HEART CATH AND CORONARY ANGIOGRAPHY N/A 03/10/2017   Procedure: LEFT HEART CATH AND CORONARY ANGIOGRAPHY;  Surgeon: [name], Aundre Hietala M, MD;  Location: [facility];  Service: Cardiovascular;  Laterality: N/A;  . LEFT HEART CATH AND CORONARY ANGIOGRAPHY N/A 04/01/2017   Procedure: LEFT HEART CATH AND CORONARY ANGIOGRAPHY;  Surgeon: [name], Zylon Creamer M, MD;  Location: [facility];  Service: Cardiovascular;  Laterality: N/A;     Current Outpatient Medications  Medication Sig Dispense Refill  . aspirin 81 MG chewable tablet Chew 1 tablet (81 mg total) by mouth daily.    Marland Kitchen atorvastatin (LIPITOR) 80 MG tablet Take 1 tablet (80 mg total) by mouth daily at 6 PM. 90 tablet 1  . Cholecalciferol (VITAMIN D3 PO) Take 2,500 Units by mouth  daily.    . furosemide (LASIX) 20 MG tablet Take 1 tablet (20 mg total) by mouth daily. 30 tablet 1  . gabapentin (NEURONTIN) 300 MG capsule Take 600 mg by mouth at bedtime.    . insulin detemir (LEVEMIR) 100 UNIT/ML injection Inject 0.25 mLs (25 Units total) into the skin 2 (two) times daily. Patient uses sliding scale at home per primary care provider.    . isosorbide mononitrate (IMDUR) 30 MG 24 hr tablet Take 1 tablet (30 mg total) by mouth daily. 30 tablet 6  . Multiple Vitamin (MULTIVITAMIN  WITH MINERALS) TABS tablet Take 1 tablet by mouth daily.    . nitroGLYCERIN (NITROSTAT) 0.4 MG SL tablet Place 1 tablet (0.4 mg total) under the tongue every 5 (five) minutes as needed for chest pain. 25 tablet 2  . pantoprazole (PROTONIX) 40 MG tablet Take 1 tablet (40 mg total) by mouth 2 (two) times daily. 180 tablet 1  . potassium chloride SA (K-DUR,KLOR-CON) 20 MEQ tablet Take 1 tablet (20 mEq total) by mouth 2 (two) times daily. 180 tablet 1  . sacubitril-valsartan (ENTRESTO) 24-26 MG Take 1 tablet by mouth 2 (two) times daily. 60 tablet 5  . Saline 0.2 % SOLN Place 1 spray into the nose daily as needed (congestion).    . ticagrelor (BRILINTA) 90 MG TABS tablet Take 1 tablet (90 mg total) by mouth 2 (two) times daily. 180 tablet 2  . tiZANidine (ZANAFLEX) 4 MG tablet Take 4 mg by mouth at bedtime as needed for muscle spasms.    . carvedilol (COREG) 6.25 MG tablet Take 3 tablets twice a day 180 tablet 3   No current facility-administered medications for this visit.     Allergies:   Amoxicillin; Hydrocodone; Lisinopril; Meloxicam; Strawberry extract; and Tape    Social History:  The patient  reports that she has never smoked. She has never used smokeless tobacco. She reports that she does not drink alcohol or use drugs.   Family History:  The patient's family history includes Diabetes in her mother; Heart disease in her mother; Hypertension in her mother; Stroke in her mother.    ROS: All other systems are reviewed and negative. Unless otherwise mentioned in H&P    PHYSICAL EXAM: VS:  BP 123/68   Pulse 66   Ht 5' 8.5" (1.74 m)   Wt 238 lb (108 kg)   BMI 35.66 kg/m  , BMI Body mass index is 35.66 kg/m. GEN: Well nourished, well developed, in no acute distress  HEENT: normal  Neck: no JVD, carotid bruits, or masses Cardiac: RRR; soft systolic murmur, rubs, or gallops,no edema  Respiratory:  clear to auscultation bilaterally, normal work of breathing GI: soft, nontender,  nondistended, + BS MS: no deformity or atrophy  Skin: warm and dry, no rash Neuro:  Strength and sensation are intact Psych: euthymic mood, full affect   EKG: Not completed this office visit.  Recent Labs: 03/13/2017: Magnesium 1.9 03/15/2017: ALT 22 03/30/2017: B Natriuretic Peptide 462.4 05/03/2017: BUN 23; Creatinine, Ser 1.48; Hemoglobin 8.1; Platelets 222; Potassium 4.7; Sodium 140    Lipid Panel    Component Value Date/Time   CHOL 204 (H) 03/10/2017 1227   TRIG 41 03/10/2017 1227   HDL 47 03/10/2017 1227   CHOLHDL 4.3 03/10/2017 1227   VLDL 8 03/10/2017 1227   LDLCALC 149 (H) 03/10/2017 1227    last LDL 60 on lab in January 2019  Wt Readings from Last 3 Encounters:  07/28/17 238 lb (  108 kg)  06/14/17 241 lb (109.3 kg)  05/03/17 249 lb 3.2 oz (113 kg)    Other studies Reviewed: Conclusion     Prox RCA to Mid RCA lesion is 80% stenosed.  Dist LAD lesion is 100% stenosed.  Prox LAD lesion is 100% stenosed.  Prox Cx to Mid Cx lesion is 50% stenosed.  Mid Cx lesion is 85% stenosed.  Dist Cx lesion is 90% stenosed.  3rd Mrg lesion is 90% stenosed.  A drug-eluting stent was successfully placed using a STENT SYNERGY DES 3X32.  Post intervention, there is a 0% residual stenosis.  LV end diastolic pressure is mildly elevated.   1. 3 vessel obstructive CAD    - 100% mid LAD occlusion - this is the culprit lesion    - severe disease in distal LCx/OM3. This area is too small for revascularization    - diffuse 80% proximal to mid RCA 2. Mildly elevated LVEDP 3. Successful PCI with stenting of the mid LAD with DES. Persistent total occlusion of the the distal LAD  Plan: DAPT for one year. IV Aggrastat for 18 hours. BP control with IV Ntg. Assess LV function with Echo. Would consider PCI of the RCA prior to DC home.    Echocardiogram: 06/07/2017  Transthoracic echocardiography. Image   quality was suboptimal. The study was technically difficult.   Intravenous  contrast (Definity) was administered. - Left ventricle: The cavity size was mildly dilated. Systolic   function was moderately to severely reduced. The estimated   ejection fraction was in the range of 30% to 35%. Akinesis and   scarring of the mid-apicalanteroseptal, anterior, and apical   myocardium; consistent with infarction in the distribution of the   left anterior descending coronary artery. Severe hypokinesis of   the inferolateral myocardium; consistent with ischemia in the   distribution of the left circumflex coronary artery. Features are   consistent with a pseudonormal left ventricular filling pattern,   with concomitant abnormal relaxation and increased filling   pressure (grade 2 diastolic dysfunction). Acoustic contrast   opacification revealed no evidence ofthrombus. - Left atrium: The atrium was mildly dilated. - Right ventricle: The cavity size was mildly dilated.   ASSESSMENT AND PLAN:  1.  Ischemic cardiomyopathy: Reduced systolic function with a EF of 30-35%.  The patient has had repeat echocardiogram 3 months post MI which did not show any improvement since December 2018.  Quit wearing Lifevest.  BP low in April so meds scaled back. Due for follow up in Heart failure clinic next week. Will allow them to adjust meds. I have recommended EP evaluation for ICD implant.  2.  Coronary artery disease: Successful PCI with stenting of the mid LAD with DES. Persistent total occlusion of the the distal LAD.  She continues on dual antiplatelet therapy for one year.  Continue statin therapy.  3.  Insulin-dependent diabetes: Followed by PCP  4. HLD. Well controlled on statin therapy  5. Anemia. Iron deficient. Receiving IV iron per hematology.  Noha Karasik [name] MD, [facility] 07/28/2017 4:11 PM    [facility] [address], [place],  [address] Phone: [phone]; Fax: [phone]

## 2017-07-28 ENCOUNTER — Ambulatory Visit (INDEPENDENT_AMBULATORY_CARE_PROVIDER_SITE_OTHER): Payer: Medicare Other | Admitting: Cardiology

## 2017-07-28 ENCOUNTER — Encounter: Payer: Self-pay | Admitting: Cardiology

## 2017-07-28 VITALS — BP 123/68 | HR 66 | Ht 68.5 in | Wt 238.0 lb

## 2017-07-28 DIAGNOSIS — E7800 Pure hypercholesterolemia, unspecified: Secondary | ICD-10-CM

## 2017-07-28 DIAGNOSIS — I2510 Atherosclerotic heart disease of native coronary artery without angina pectoris: Secondary | ICD-10-CM

## 2017-07-28 DIAGNOSIS — I5022 Chronic systolic (congestive) heart failure: Secondary | ICD-10-CM | POA: Diagnosis not present

## 2017-07-28 DIAGNOSIS — I10 Essential (primary) hypertension: Secondary | ICD-10-CM

## 2017-07-28 NOTE — Patient Instructions (Signed)
Continue your current medication  Keep your follow up with Dr. [name] in the heart failure clinic  We will have you see one of our rhythm specialist to consider a defibrillator  I will see you in 6 months.

## 2017-08-02 ENCOUNTER — Ambulatory Visit (HOSPITAL_COMMUNITY)
Admission: RE | Admit: 2017-08-02 | Discharge: 2017-08-02 | Disposition: A | Discharge status: Home / Self Care | Payer: Medicare Other | Source: Ambulatory Visit | Attending: Cardiology | Admitting: Cardiology

## 2017-08-02 ENCOUNTER — Encounter (HOSPITAL_COMMUNITY): Payer: Self-pay | Admitting: Cardiology

## 2017-08-02 ENCOUNTER — Other Ambulatory Visit: Payer: Self-pay

## 2017-08-02 VITALS — BP 143/70 | HR 66 | Wt 241.8 lb

## 2017-08-02 DIAGNOSIS — I255 Ischemic cardiomyopathy: Secondary | ICD-10-CM | POA: Insufficient documentation

## 2017-08-02 DIAGNOSIS — I252 Old myocardial infarction: Secondary | ICD-10-CM | POA: Insufficient documentation

## 2017-08-02 DIAGNOSIS — E1122 Type 2 diabetes mellitus with diabetic chronic kidney disease: Secondary | ICD-10-CM | POA: Diagnosis not present

## 2017-08-02 DIAGNOSIS — I5022 Chronic systolic (congestive) heart failure: Secondary | ICD-10-CM | POA: Insufficient documentation

## 2017-08-02 DIAGNOSIS — Z79899 Other long term (current) drug therapy: Secondary | ICD-10-CM | POA: Insufficient documentation

## 2017-08-02 DIAGNOSIS — Z7982 Long term (current) use of aspirin: Secondary | ICD-10-CM | POA: Insufficient documentation

## 2017-08-02 DIAGNOSIS — D509 Iron deficiency anemia, unspecified: Secondary | ICD-10-CM | POA: Diagnosis not present

## 2017-08-02 DIAGNOSIS — I130 Hypertensive heart and chronic kidney disease with heart failure and stage 1 through stage 4 chronic kidney disease, or unspecified chronic kidney disease: Secondary | ICD-10-CM | POA: Diagnosis present

## 2017-08-02 DIAGNOSIS — N183 Chronic kidney disease, stage 3 (moderate): Secondary | ICD-10-CM | POA: Insufficient documentation

## 2017-08-02 DIAGNOSIS — Z794 Long term (current) use of insulin: Secondary | ICD-10-CM | POA: Diagnosis not present

## 2017-08-02 DIAGNOSIS — I2510 Atherosclerotic heart disease of native coronary artery without angina pectoris: Secondary | ICD-10-CM | POA: Diagnosis not present

## 2017-08-02 DIAGNOSIS — K219 Gastro-esophageal reflux disease without esophagitis: Secondary | ICD-10-CM | POA: Diagnosis not present

## 2017-08-02 DIAGNOSIS — I5042 Chronic combined systolic (congestive) and diastolic (congestive) heart failure: Secondary | ICD-10-CM | POA: Diagnosis not present

## 2017-08-02 DIAGNOSIS — E785 Hyperlipidemia, unspecified: Secondary | ICD-10-CM | POA: Insufficient documentation

## 2017-08-02 LAB — LIPID PANEL
CHOL/HDL RATIO: 2.9 ratio
CHOLESTEROL: 117 mg/dL (ref 0–200)
HDL: 40 mg/dL — ABNORMAL LOW (ref >40)
LDL CALC: 65 mg/dL (ref 0–99)
TRIGLYCERIDES: 61 mg/dL (ref <150)
VLDL: 12 mg/dL (ref 0–40)

## 2017-08-02 LAB — BASIC METABOLIC PANEL
Anion gap: 7 (ref 5–15)
BUN: 24 mg/dL — AB (ref 6–20)
CHLORIDE: 108 mmol/L (ref 101–111)
CO2: 26 mmol/L (ref 22–32)
CREATININE: 1.66 mg/dL — AB (ref 0.44–1.00)
Calcium: 9.4 mg/dL (ref 8.9–10.3)
GFR calc Af Amer: 38 mL/min — ABNORMAL LOW (ref >60)
GFR calc non Af Amer: 33 mL/min — ABNORMAL LOW (ref >60)
Glucose, Bld: 164 mg/dL — ABNORMAL HIGH (ref 65–99)
POTASSIUM: 4.7 mmol/L (ref 3.5–5.1)
SODIUM: 141 mmol/L (ref 135–145)

## 2017-08-02 LAB — CBC
HEMATOCRIT: 33.8 % — AB (ref 36.0–46.0)
HEMOGLOBIN: 10.4 g/dL — AB (ref 12.0–15.0)
MCH: 26.9 pg (ref 26.0–34.0)
MCHC: 30.8 g/dL (ref 30.0–36.0)
MCV: 87.6 fL (ref 78.0–100.0)
Platelets: 196 10*3/uL (ref 150–400)
RBC: 3.86 MIL/uL — AB (ref 3.87–5.11)
RDW: 16.2 % — ABNORMAL HIGH (ref 11.5–15.5)
WBC: 8.1 10*3/uL (ref 4.0–10.5)

## 2017-08-02 MED ORDER — SPIRONOLACTONE 25 MG PO TABS: 12.5000 mg | ORAL_TABLET | Freq: Every day | ORAL | 3 refills | Status: DC

## 2017-08-02 NOTE — Patient Instructions (Signed)
Stop IMDUR  Start Spironolactone 12.5 (1/2 tab) every night  Labs drawn today (if we do not call you, then your lab work was stable)   Your physician recommends that you schedule a follow-up appointment in: pharmacy and labs in 2 weeks  Your physician recommends that you schedule a follow-up appointment in: 6 weeks with Dr. [name]

## 2017-08-03 NOTE — Progress Notes (Signed)
PCP: Dr. Margo Common Cardiology: Dr. [name] HF Cardiology: Dr. [name]  57 yo with history of CAD and ischemic cardiomyopathy was referred by Jory Sims for evaluation of CHF.  In 12/18, she had anterior MI with DES to totally occluded mid LAD (culprit). She returned in 1/19 for staged DES to 80% RCA stenosis. Echo in 12/18 showed EF 30-35%.  Repeat echo in 3/19 showed EF stable at 30-35%.    Her cardiac meds have been titrated up.  However, the Coreg dose was decreased to 18.75 mg bid due to low BP. Currently, no significant exertional dyspnea.  She is able to do yardwork without problems.  No chest pain. No lightheadedness.  No orthopnea/PND. She is doing cardiac rehab at [facility] (lives in [place]).   Labs (2/19): K 4.7, creatinine 1.48, hgb 8.4  ECG (2/19, personally reviewed): NSR, lateral TWIs, narrow QRS  PMH: 1. Type II diabetes 2. CKD stage 3.  3. Hyperlipidemia 4. HTN 5. Fe deficiency anemia 6. GERD 7. CAD: Anterior MI in 12/18 with DES to 100% stenosed mid LAD (culprit), also with CTO of distal LAD, 80% pRCA, 85% mLCx/90% dLCx, 90% OM3.  - 1/19 staged PCI of proximal RCA.  8. Chronic systolic CHF: Ischemic cardiomyopathy.  - Echo (12/18): EF 30-35%.  - Echo (3/19): EF 30-35%, WMAs in LAD territory, RV mildly dilated.   Social History   Socioeconomic History  . Marital status: Single    Spouse name: Not on file  . Number of children: Not on file  . Years of education: Not on file  . Highest education level: Not on file  Occupational History  . Not on file  Social Needs  . Financial resource strain: Not on file  . Food insecurity:    Worry: Not on file    Inability: Not on file  . Transportation needs:    Medical: Not on file    Non-medical: Not on file  Tobacco Use  . Smoking status: Never Smoker  . Smokeless tobacco: Never Used  Substance and Sexual Activity  . Alcohol use: No    Frequency: Never  . Drug use: No  . Sexual activity: Not on file   Lifestyle  . Physical activity:    Days per week: Not on file    Minutes per session: Not on file  . Stress: Not on file  Relationships  . Social connections:    Talks on phone: Not on file    Gets together: Not on file    Attends religious service: Not on file    Active member of club or organization: Not on file    Attends meetings of clubs or organizations: Not on file    Relationship status: Not on file  . Intimate partner violence:    Fear of current or ex partner: Not on file    Emotionally abused: Not on file    Physically abused: Not on file    Forced sexual activity: Not on file  Other Topics Concern  . Not on file  Social History Narrative  . Not on file   Family History  Problem Relation Age of Onset  . Hypertension Mother   . Heart disease Mother   . Diabetes Mother   . Stroke Mother    ROS: All systems reviewed and negative except as per HPI.   Current Outpatient Medications  Medication Sig Dispense Refill  . aspirin 81 MG chewable tablet Chew 1 tablet (81 mg total) by mouth daily.    Marland Kitchen  atorvastatin (LIPITOR) 80 MG tablet Take 1 tablet (80 mg total) by mouth daily at 6 PM. 90 tablet 1  . carvedilol (COREG) 6.25 MG tablet Take 3 tablets twice a day 180 tablet 3  . Cholecalciferol (VITAMIN D3 PO) Take 2,500 Units by mouth daily.    . furosemide (LASIX) 20 MG tablet Take 1 tablet (20 mg total) by mouth daily. 30 tablet 1  . gabapentin (NEURONTIN) 300 MG capsule Take 600 mg by mouth at bedtime.    . insulin detemir (LEVEMIR) 100 UNIT/ML injection Inject 0.25 mLs (25 Units total) into the skin 2 (two) times daily. Patient uses sliding scale at home per primary care provider.    . Multiple Vitamin (MULTIVITAMIN WITH MINERALS) TABS tablet Take 1 tablet by mouth daily.    . nitroGLYCERIN (NITROSTAT) 0.4 MG SL tablet Place 1 tablet (0.4 mg total) under the tongue every 5 (five) minutes as needed for chest pain. 25 tablet 2  . pantoprazole (PROTONIX) 40 MG tablet Take  1 tablet (40 mg total) by mouth 2 (two) times daily. 180 tablet 1  . potassium chloride SA (K-DUR,KLOR-CON) 20 MEQ tablet Take 1 tablet (20 mEq total) by mouth 2 (two) times daily. 180 tablet 1  . sacubitril-valsartan (ENTRESTO) 24-26 MG Take 1 tablet by mouth 2 (two) times daily. 60 tablet 5  . Saline 0.2 % SOLN Place 1 spray into the nose daily as needed (congestion).    . ticagrelor (BRILINTA) 90 MG TABS tablet Take 1 tablet (90 mg total) by mouth 2 (two) times daily. 180 tablet 2  . tiZANidine (ZANAFLEX) 4 MG tablet Take 4 mg by mouth at bedtime as needed for muscle spasms.    Marland Kitchen spironolactone (ALDACTONE) 25 MG tablet Take 0.5 tablets (12.5 mg total) by mouth at bedtime. 15 tablet 3   No current facility-administered medications for this encounter.    BP (!) 143/70   Pulse 66   Wt 241 lb 12 oz (109.7 kg)   SpO2 100%   BMI 36.22 kg/m  General: NAD Neck: No JVD, no thyromegaly or thyroid nodule.  Lungs: Clear to auscultation bilaterally with normal respiratory effort. CV: Nondisplaced PMI.  Heart regular S1/S2, no S3/S4, no murmur.  No peripheral edema.  No carotid bruit.  Normal pedal pulses.  Abdomen: Soft, nontender, no hepatosplenomegaly, no distention.  Skin: Intact without lesions or rashes.  Neurologic: Alert and oriented x 3.  Psych: Normal affect. Extremities: No clubbing or cyanosis.  HEENT: Normal.   Assessment/Plan: 1. CAD: Anterior MI in 12/18 with DES to proximal LAD (culprit) followed by staged DES to proximal RCA.  No chest pain.  She is doing cardiac rehab in [facility].  - I think that she can stop Imdur, will allow more BP room for other meds.  - Continue atorvastatin, check lipids today.  - Continue ASA 81 daily and ticagrelor 90 bid.  2. Chronic systolic CHF: Ischemic cardiomyopathy. EF has been stably low in the 30-35% range.  NYHA class II symptoms.  She is not volume overloaded on exam.  - Continue Coreg 18.75 mg bid.  - Continue Entresto 24/26 bid.  -  Add spironolactone 12.5 mg qhs.  BMET today and again in 10 days.  - We discussed ICD at length today.  She qualifies for ICD with EF persistently < 35%.  She is not a CRT candidate.  I think ICD would be a good idea for her.  She has appt with Dr. Caryl Comes soon to discuss.  3. CKD: stage 3 . Check BMET today.  4. Fe deficiency anemia: Long-standing.  She has had IV Fe infusions.   Followup in HF pharmacy in 2 wks for med titration, then see me in 6 wks.   Loralie Champagne 08/03/2017

## 2017-08-16 ENCOUNTER — Institutional Professional Consult (permissible substitution): Payer: Medicare Other | Admitting: Internal Medicine

## 2017-08-18 ENCOUNTER — Other Ambulatory Visit: Payer: Self-pay | Admitting: Adult Health

## 2017-08-18 ENCOUNTER — Other Ambulatory Visit: Payer: Self-pay | Admitting: Cardiology

## 2017-08-18 NOTE — Telephone Encounter (Signed)
Rx sent to pharmacy   

## 2017-08-18 NOTE — Telephone Encounter (Signed)
This is Dr. Jordan's pt. °

## 2017-08-29 ENCOUNTER — Other Ambulatory Visit: Payer: Self-pay

## 2017-08-29 MED ORDER — SACUBITRIL-VALSARTAN 24-26 MG PO TABS: 1.0000 | ORAL_TABLET | Freq: Two times a day (BID) | ORAL | 11 refills | Status: DC

## 2017-08-29 NOTE — Telephone Encounter (Signed)
Rx(s) sent to pharmacy electronically.  

## 2017-08-30 ENCOUNTER — Inpatient Hospital Stay (HOSPITAL_COMMUNITY)
Admission: RE | Admit: 2017-08-30 | Discharge: 2017-08-30 | Disposition: A | Discharge status: Home / Self Care | Payer: Medicare Other | Source: Ambulatory Visit

## 2017-09-19 ENCOUNTER — Other Ambulatory Visit: Payer: Self-pay | Admitting: Cardiology

## 2017-09-20 NOTE — Telephone Encounter (Signed)
Rx sent to pharmacy   

## 2017-09-27 ENCOUNTER — Ambulatory Visit (HOSPITAL_COMMUNITY)
Admission: RE | Admit: 2017-09-27 | Discharge: 2017-09-27 | Disposition: A | Discharge status: Home / Self Care | Payer: Medicare Other | Source: Ambulatory Visit | Attending: Cardiology | Admitting: Cardiology

## 2017-09-27 VITALS — BP 135/71 | HR 73 | Wt 242.1 lb

## 2017-09-27 DIAGNOSIS — D509 Iron deficiency anemia, unspecified: Secondary | ICD-10-CM | POA: Diagnosis not present

## 2017-09-27 DIAGNOSIS — I2510 Atherosclerotic heart disease of native coronary artery without angina pectoris: Secondary | ICD-10-CM | POA: Insufficient documentation

## 2017-09-27 DIAGNOSIS — Z79899 Other long term (current) drug therapy: Secondary | ICD-10-CM | POA: Diagnosis not present

## 2017-09-27 DIAGNOSIS — Z955 Presence of coronary angioplasty implant and graft: Secondary | ICD-10-CM | POA: Diagnosis not present

## 2017-09-27 DIAGNOSIS — I5022 Chronic systolic (congestive) heart failure: Secondary | ICD-10-CM | POA: Insufficient documentation

## 2017-09-27 DIAGNOSIS — E785 Hyperlipidemia, unspecified: Secondary | ICD-10-CM | POA: Diagnosis not present

## 2017-09-27 DIAGNOSIS — Z794 Long term (current) use of insulin: Secondary | ICD-10-CM | POA: Insufficient documentation

## 2017-09-27 DIAGNOSIS — I252 Old myocardial infarction: Secondary | ICD-10-CM | POA: Insufficient documentation

## 2017-09-27 DIAGNOSIS — I130 Hypertensive heart and chronic kidney disease with heart failure and stage 1 through stage 4 chronic kidney disease, or unspecified chronic kidney disease: Secondary | ICD-10-CM | POA: Insufficient documentation

## 2017-09-27 DIAGNOSIS — I5041 Acute combined systolic (congestive) and diastolic (congestive) heart failure: Secondary | ICD-10-CM

## 2017-09-27 DIAGNOSIS — E1122 Type 2 diabetes mellitus with diabetic chronic kidney disease: Secondary | ICD-10-CM | POA: Insufficient documentation

## 2017-09-27 DIAGNOSIS — I255 Ischemic cardiomyopathy: Secondary | ICD-10-CM | POA: Insufficient documentation

## 2017-09-27 DIAGNOSIS — N183 Chronic kidney disease, stage 3 (moderate): Secondary | ICD-10-CM | POA: Insufficient documentation

## 2017-09-27 DIAGNOSIS — Z7982 Long term (current) use of aspirin: Secondary | ICD-10-CM | POA: Insufficient documentation

## 2017-09-27 DIAGNOSIS — K219 Gastro-esophageal reflux disease without esophagitis: Secondary | ICD-10-CM | POA: Diagnosis not present

## 2017-09-27 LAB — BASIC METABOLIC PANEL
ANION GAP: 8 (ref 5–15)
BUN: 21 mg/dL — ABNORMAL HIGH (ref 6–20)
CALCIUM: 9.4 mg/dL (ref 8.9–10.3)
CO2: 23 mmol/L (ref 22–32)
Chloride: 110 mmol/L (ref 98–111)
Creatinine, Ser: 1.83 mg/dL — ABNORMAL HIGH (ref 0.44–1.00)
GFR calc Af Amer: 34 mL/min — ABNORMAL LOW (ref >60)
GFR, EST NON AFRICAN AMERICAN: 29 mL/min — AB (ref >60)
GLUCOSE: 81 mg/dL (ref 70–99)
Potassium: 4.6 mmol/L (ref 3.5–5.1)
SODIUM: 141 mmol/L (ref 135–145)

## 2017-09-27 LAB — MAGNESIUM: Magnesium: 2 mg/dL (ref 1.7–2.4)

## 2017-09-27 MED ORDER — POTASSIUM CHLORIDE CRYS ER 20 MEQ PO TBCR: 20.0000 meq | EXTENDED_RELEASE_TABLET | Freq: Every day | ORAL | 1 refills | Status: DC

## 2017-09-27 MED ORDER — ISOSORBIDE MONONITRATE ER 30 MG PO TB24: 30.0000 mg | ORAL_TABLET | Freq: Every day | ORAL | 11 refills | Status: DC

## 2017-09-27 MED ORDER — SACUBITRIL-VALSARTAN 49-51 MG PO TABS: 1.0000 | ORAL_TABLET | Freq: Two times a day (BID) | ORAL | 3 refills | Status: DC

## 2017-09-27 MED ORDER — FUROSEMIDE 20 MG PO TABS: 20.0000 mg | ORAL_TABLET | ORAL | 4 refills | Status: DC

## 2017-09-27 NOTE — Patient Instructions (Addendum)
Increase Entresto to 49/51mg  twice daily.  Decrease Lasix to 20mg  every other day.  Decrease Potassium to 20mg  daily.  Routine lab work today. Will notify you of abnormal results  Repeat labs in 10 days.  Follow up with Dr.McLean in 6 weeks.   You have been referred to Electrophysiology with Dr.Allred they will contact you to schedule appointment.

## 2017-09-27 NOTE — Progress Notes (Signed)
PCP: Dr. Margo Common Cardiology: Dr. [name] HF Cardiology: Dr. [name]  57 yo with history of CAD and ischemic cardiomyopathy was referred by Jory Sims for evaluation of CHF.  In 12/18, she had anterior MI with DES to totally occluded mid LAD (culprit). She returned in 1/19 for staged DES to 80% RCA stenosis. Echo in 12/18 showed EF 30-35%.  Repeat echo in 3/19 showed EF stable at 30-35%.    She returns for followup of CHF and CAD.  She has chest pain rarely, every 2-3 weeks, generally when she is working in the yard and carrying a heavy load.  No claudication.  No dyspnea walking on flat ground or with yardwork.  She has been very active. Rare dizziness.  Weight stable.   Labs (2/19): K 4.7, creatinine 1.48, hgb 8.4 Labs (5/19): LDL 65, K 4.7, creatinine 1.66  PMH: 1. Type II diabetes 2. CKD stage 3.  3. Hyperlipidemia 4. HTN 5. Fe deficiency anemia 6. GERD 7. CAD: Anterior MI in 12/18 with DES to 100% stenosed mid LAD (culprit), also with CTO of distal LAD, 80% pRCA, 85% mLCx/90% dLCx, 90% OM3.  - 1/19 staged PCI of proximal RCA.  8. Chronic systolic CHF: Ischemic cardiomyopathy.  - Echo (12/18): EF 30-35%.  - Echo (3/19): EF 30-35%, WMAs in LAD territory, RV mildly dilated.   Social History   Socioeconomic History  . Marital status: Single    Spouse name: Not on file  . Number of children: Not on file  . Years of education: Not on file  . Highest education level: Not on file  Occupational History  . Not on file  Social Needs  . Financial resource strain: Not on file  . Food insecurity:    Worry: Not on file    Inability: Not on file  . Transportation needs:    Medical: Not on file    Non-medical: Not on file  Tobacco Use  . Smoking status: Never Smoker  . Smokeless tobacco: Never Used  Substance and Sexual Activity  . Alcohol use: No    Frequency: Never  . Drug use: No  . Sexual activity: Not on file  Lifestyle  . Physical activity:    Days per week: Not on  file    Minutes per session: Not on file  . Stress: Not on file  Relationships  . Social connections:    Talks on phone: Not on file    Gets together: Not on file    Attends religious service: Not on file    Active member of club or organization: Not on file    Attends meetings of clubs or organizations: Not on file    Relationship status: Not on file  . Intimate partner violence:    Fear of current or ex partner: Not on file    Emotionally abused: Not on file    Physically abused: Not on file    Forced sexual activity: Not on file  Other Topics Concern  . Not on file  Social History Narrative  . Not on file   Family History  Problem Relation Age of Onset  . Hypertension Mother   . Heart disease Mother   . Diabetes Mother   . Stroke Mother    ROS: All systems reviewed and negative except as per HPI.   Current Outpatient Medications  Medication Sig Dispense Refill  . aspirin 81 MG chewable tablet Chew 1 tablet (81 mg total) by mouth daily.    Marland Kitchen atorvastatin (LIPITOR)  80 MG tablet Take 1 tablet (80 mg total) by mouth daily at 6 PM. 90 tablet 1  . carvedilol (COREG) 6.25 MG tablet Take 3 tablets twice a day 180 tablet 3  . furosemide (LASIX) 20 MG tablet Take 1 tablet (20 mg total) by mouth every other day. 15 tablet 4  . gabapentin (NEURONTIN) 300 MG capsule Take 600 mg by mouth at bedtime.    . insulin detemir (LEVEMIR) 100 UNIT/ML injection Inject 0.25 mLs (25 Units total) into the skin 2 (two) times daily. Patient uses sliding scale at home per primary care provider.    . Multiple Vitamin (MULTIVITAMIN WITH MINERALS) TABS tablet Take 1 tablet by mouth daily.    . nitroGLYCERIN (NITROSTAT) 0.4 MG SL tablet Place 1 tablet (0.4 mg total) under the tongue every 5 (five) minutes as needed for chest pain. 25 tablet 2  . pantoprazole (PROTONIX) 40 MG tablet Take 1 tablet (40 mg total) by mouth 2 (two) times daily. 180 tablet 1  . potassium chloride SA (K-DUR,KLOR-CON) 20 MEQ tablet  Take 1 tablet (20 mEq total) by mouth daily. 90 tablet 1  . Saline 0.2 % SOLN Place 1 spray into the nose daily as needed (congestion).    Marland Kitchen spironolactone (ALDACTONE) 25 MG tablet Take 0.5 tablets (12.5 mg total) by mouth at bedtime. 15 tablet 3  . ticagrelor (BRILINTA) 90 MG TABS tablet Take 1 tablet (90 mg total) by mouth 2 (two) times daily. 180 tablet 2  . tiZANidine (ZANAFLEX) 4 MG tablet Take 4 mg by mouth at bedtime as needed for muscle spasms.    . isosorbide mononitrate (IMDUR) 30 MG 24 hr tablet Take 1 tablet (30 mg total) by mouth daily. 30 tablet 11  . sacubitril-valsartan (ENTRESTO) 49-51 MG Take 1 tablet by mouth 2 (two) times daily. 60 tablet 3   No current facility-administered medications for this encounter.    BP 135/71   Pulse 73   Wt 242 lb 1.9 oz (109.8 kg)   SpO2 100%   BMI 36.28 kg/m  General: NAD Neck: No JVD, no thyromegaly or thyroid nodule.  Lungs: Clear to auscultation bilaterally with normal respiratory effort. CV: Nondisplaced PMI.  Heart regular S1/S2, no S3/S4, no murmur. Trace ankle edema.  No carotid bruit.  Normal pedal pulses.  Abdomen: Soft, nontender, no hepatosplenomegaly, no distention.  Skin: Intact without lesions or rashes.  Neurologic: Alert and oriented x 3.  Psych: Normal affect. Extremities: No clubbing or cyanosis.  HEENT: Normal.   Assessment/Plan: 1. CAD: Anterior MI in 12/18 with DES to proximal LAD (culprit) followed by staged DES to proximal RCA.  Rare chest pain, possibly due to LCx territory ischemia.   - Continue Imdur 30 mg daily.  - Continue atorvastatin, good lipids in 5/19.   - Continue ASA 81 daily and ticagrelor 90 bid.   2. Chronic systolic CHF: Ischemic cardiomyopathy. EF has been stably low in the 30-35% range.  NYHA class II symptoms.  She is not volume overloaded on exam.  - Continue Coreg 18.75 mg bid.  - Increase Entresto to 49/51 bid with BMET today and again in 10 days.   - Decrease Lasix to 20 mg every other  day.  - Continue spironolactone 12.5 daily.  Decrease KCl to 20 daily.  - She qualifies for ICD with EF persistently < 35%.  She is not a CRT candidate.  I think ICD would be a good idea for her. She missed her EP appointment (due to  grand-daughter's birth) but is willing to reschedule.  I will set appointment up with Dr. Rayann Heman in the [place] office for her convenience.  3. CKD: stage 3 . Check BMET today.  4. Fe deficiency anemia: Long-standing.  She has had IV Fe infusions.   Followup in 6 wks.   Stephanie Manning 09/27/2017

## 2017-10-10 ENCOUNTER — Other Ambulatory Visit: Payer: Self-pay

## 2017-10-10 ENCOUNTER — Inpatient Hospital Stay (HOSPITAL_COMMUNITY)
Admission: AD | Admit: 2017-10-10 | Discharge: 2017-10-13 | DRG: 286 | Disposition: A | Discharge status: Home / Self Care | Payer: Medicare Other | Attending: Cardiology | Admitting: Cardiology

## 2017-10-10 ENCOUNTER — Encounter (HOSPITAL_COMMUNITY): Payer: Self-pay | Admitting: General Practice

## 2017-10-10 DIAGNOSIS — N184 Chronic kidney disease, stage 4 (severe): Secondary | ICD-10-CM | POA: Diagnosis present

## 2017-10-10 DIAGNOSIS — Z794 Long term (current) use of insulin: Secondary | ICD-10-CM | POA: Diagnosis not present

## 2017-10-10 DIAGNOSIS — I252 Old myocardial infarction: Secondary | ICD-10-CM | POA: Diagnosis not present

## 2017-10-10 DIAGNOSIS — E785 Hyperlipidemia, unspecified: Secondary | ICD-10-CM | POA: Diagnosis present

## 2017-10-10 DIAGNOSIS — E7800 Pure hypercholesterolemia, unspecified: Secondary | ICD-10-CM

## 2017-10-10 DIAGNOSIS — N179 Acute kidney failure, unspecified: Secondary | ICD-10-CM | POA: Diagnosis present

## 2017-10-10 DIAGNOSIS — I10 Essential (primary) hypertension: Secondary | ICD-10-CM | POA: Diagnosis not present

## 2017-10-10 DIAGNOSIS — I255 Ischemic cardiomyopathy: Secondary | ICD-10-CM | POA: Diagnosis present

## 2017-10-10 DIAGNOSIS — N183 Chronic kidney disease, stage 3 (moderate): Secondary | ICD-10-CM | POA: Diagnosis present

## 2017-10-10 DIAGNOSIS — Z91048 Other nonmedicinal substance allergy status: Secondary | ICD-10-CM

## 2017-10-10 DIAGNOSIS — Z888 Allergy status to other drugs, medicaments and biological substances status: Secondary | ICD-10-CM

## 2017-10-10 DIAGNOSIS — I5042 Chronic combined systolic (congestive) and diastolic (congestive) heart failure: Secondary | ICD-10-CM | POA: Diagnosis not present

## 2017-10-10 DIAGNOSIS — I2510 Atherosclerotic heart disease of native coronary artery without angina pectoris: Secondary | ICD-10-CM | POA: Diagnosis not present

## 2017-10-10 DIAGNOSIS — Z79899 Other long term (current) drug therapy: Secondary | ICD-10-CM | POA: Diagnosis not present

## 2017-10-10 DIAGNOSIS — I25110 Atherosclerotic heart disease of native coronary artery with unstable angina pectoris: Principal | ICD-10-CM | POA: Diagnosis present

## 2017-10-10 DIAGNOSIS — I200 Unstable angina: Secondary | ICD-10-CM | POA: Diagnosis present

## 2017-10-10 DIAGNOSIS — E118 Type 2 diabetes mellitus with unspecified complications: Secondary | ICD-10-CM

## 2017-10-10 DIAGNOSIS — E119 Type 2 diabetes mellitus without complications: Secondary | ICD-10-CM

## 2017-10-10 DIAGNOSIS — E1122 Type 2 diabetes mellitus with diabetic chronic kidney disease: Secondary | ICD-10-CM | POA: Diagnosis present

## 2017-10-10 DIAGNOSIS — Z7982 Long term (current) use of aspirin: Secondary | ICD-10-CM | POA: Diagnosis not present

## 2017-10-10 DIAGNOSIS — Z91018 Allergy to other foods: Secondary | ICD-10-CM | POA: Diagnosis not present

## 2017-10-10 DIAGNOSIS — K219 Gastro-esophageal reflux disease without esophagitis: Secondary | ICD-10-CM | POA: Diagnosis present

## 2017-10-10 DIAGNOSIS — I110 Hypertensive heart disease with heart failure: Secondary | ICD-10-CM | POA: Diagnosis not present

## 2017-10-10 DIAGNOSIS — I5043 Acute on chronic combined systolic (congestive) and diastolic (congestive) heart failure: Secondary | ICD-10-CM | POA: Diagnosis present

## 2017-10-10 DIAGNOSIS — E1159 Type 2 diabetes mellitus with other circulatory complications: Secondary | ICD-10-CM

## 2017-10-10 DIAGNOSIS — Z955 Presence of coronary angioplasty implant and graft: Secondary | ICD-10-CM

## 2017-10-10 DIAGNOSIS — I130 Hypertensive heart and chronic kidney disease with heart failure and stage 1 through stage 4 chronic kidney disease, or unspecified chronic kidney disease: Secondary | ICD-10-CM | POA: Diagnosis present

## 2017-10-10 DIAGNOSIS — R079 Chest pain, unspecified: Secondary | ICD-10-CM | POA: Diagnosis present

## 2017-10-10 DIAGNOSIS — I5041 Acute combined systolic (congestive) and diastolic (congestive) heart failure: Secondary | ICD-10-CM | POA: Diagnosis present

## 2017-10-10 DIAGNOSIS — I5023 Acute on chronic systolic (congestive) heart failure: Secondary | ICD-10-CM | POA: Diagnosis present

## 2017-10-10 HISTORY — DX: Iron deficiency anemia, unspecified: D50.9

## 2017-10-10 HISTORY — DX: ST elevation (STEMI) myocardial infarction of unspecified site: I21.3

## 2017-10-10 HISTORY — DX: Personal history of other medical treatment: Z92.89

## 2017-10-10 HISTORY — DX: Chronic kidney disease, stage 4 (severe): N18.4

## 2017-10-10 HISTORY — DX: Gastro-esophageal reflux disease without esophagitis: K21.9

## 2017-10-10 HISTORY — DX: Personal history of other diseases of the digestive system: Z87.19

## 2017-10-10 LAB — GLUCOSE, CAPILLARY: Glucose-Capillary: 220 mg/dL — ABNORMAL HIGH (ref 70–99)

## 2017-10-10 LAB — TROPONIN I: Troponin I: <0.03 ng/mL (ref <0.03)

## 2017-10-10 MED ORDER — HEPARIN (PORCINE) IN NACL 100-0.45 UNIT/ML-% IJ SOLN
750.0000 [IU]/h | INTRAMUSCULAR | Status: DC
  Administered 2017-10-10: 21:00:00 1200 [IU]/h via INTRAVENOUS
  Filled 2017-10-10 (×2): qty 250

## 2017-10-10 MED ORDER — GABAPENTIN 300 MG PO CAPS
600.0000 mg | ORAL_CAPSULE | Freq: Every day | ORAL | Status: DC
  Administered 2017-10-10 – 2017-10-12 (×3): 600 mg via ORAL
  Filled 2017-10-10 (×3): qty 2

## 2017-10-10 MED ORDER — ONDANSETRON HCL 4 MG/2ML IJ SOLN: 4.0000 mg | Freq: Four times a day (QID) | INTRAMUSCULAR | Status: DC | PRN

## 2017-10-10 MED ORDER — ATORVASTATIN CALCIUM 80 MG PO TABS
80.0000 mg | ORAL_TABLET | Freq: Every day | ORAL | Status: DC
  Administered 2017-10-10 – 2017-10-12 (×3): 80 mg via ORAL
  Filled 2017-10-10 (×3): qty 1

## 2017-10-10 MED ORDER — TICAGRELOR 90 MG PO TABS
90.0000 mg | ORAL_TABLET | Freq: Two times a day (BID) | ORAL | Status: DC
  Administered 2017-10-10 – 2017-10-13 (×6): 90 mg via ORAL
  Filled 2017-10-10 (×6): qty 1

## 2017-10-10 MED ORDER — NITROGLYCERIN 0.4 MG SL SUBL: 0.4000 mg | SUBLINGUAL_TABLET | SUBLINGUAL | Status: DC | PRN

## 2017-10-10 MED ORDER — PANTOPRAZOLE SODIUM 40 MG PO TBEC
40.0000 mg | DELAYED_RELEASE_TABLET | Freq: Two times a day (BID) | ORAL | Status: DC
  Administered 2017-10-10 – 2017-10-13 (×6): 40 mg via ORAL
  Filled 2017-10-10 (×7): qty 1

## 2017-10-10 MED ORDER — SODIUM CHLORIDE 0.9 % IV SOLN
INTRAVENOUS | Status: DC
  Administered 2017-10-10: 23:00:00 via INTRAVENOUS

## 2017-10-10 MED ORDER — ACETAMINOPHEN 325 MG PO TABS
650.0000 mg | ORAL_TABLET | ORAL | Status: DC | PRN
  Administered 2017-10-11: 16:00:00 650 mg via ORAL
  Filled 2017-10-10: qty 2

## 2017-10-10 MED ORDER — HEPARIN BOLUS VIA INFUSION
4000.0000 [IU] | Freq: Once | INTRAVENOUS | Status: AC
  Administered 2017-10-10: 21:00:00 4000 [IU] via INTRAVENOUS
  Filled 2017-10-10: qty 4000

## 2017-10-10 MED ORDER — ADULT MULTIVITAMIN W/MINERALS CH: 1.0000 | ORAL_TABLET | Freq: Every day | ORAL | Status: DC

## 2017-10-10 MED ORDER — INSULIN ASPART 100 UNIT/ML ~~LOC~~ SOLN
0.0000 [IU] | Freq: Three times a day (TID) | SUBCUTANEOUS | Status: DC
  Administered 2017-10-10: 5 [IU] via SUBCUTANEOUS
  Administered 2017-10-11: 07:00:00 2 [IU] via SUBCUTANEOUS
  Administered 2017-10-11: 3 [IU] via SUBCUTANEOUS
  Administered 2017-10-11 – 2017-10-12 (×2): 2 [IU] via SUBCUTANEOUS
  Administered 2017-10-12 (×2): 3 [IU] via SUBCUTANEOUS
  Administered 2017-10-13: 07:00:00 5 [IU] via SUBCUTANEOUS

## 2017-10-10 MED ORDER — ASPIRIN EC 81 MG PO TBEC
81.0000 mg | DELAYED_RELEASE_TABLET | Freq: Every day | ORAL | Status: DC
  Administered 2017-10-11 – 2017-10-13 (×3): 81 mg via ORAL
  Filled 2017-10-10 (×3): qty 1

## 2017-10-10 MED ORDER — NITROGLYCERIN 2 % TD OINT
1.0000 [in_us] | TOPICAL_OINTMENT | Freq: Four times a day (QID) | TRANSDERMAL | Status: DC
  Administered 2017-10-10 – 2017-10-11 (×5): 1 [in_us] via TOPICAL
  Filled 2017-10-10: qty 30

## 2017-10-10 MED ORDER — TIZANIDINE HCL 4 MG PO TABS
4.0000 mg | ORAL_TABLET | Freq: Every evening | ORAL | Status: DC | PRN
  Administered 2017-10-11 – 2017-10-12 (×2): 4 mg via ORAL
  Filled 2017-10-10 (×3): qty 1

## 2017-10-10 MED ORDER — CARVEDILOL 3.125 MG PO TABS
6.2500 mg | ORAL_TABLET | Freq: Two times a day (BID) | ORAL | Status: DC
  Administered 2017-10-10 – 2017-10-13 (×6): 6.25 mg via ORAL
  Filled 2017-10-10 (×5): qty 2

## 2017-10-10 NOTE — Progress Notes (Signed)
ANTICOAGULATION CONSULT NOTE - Initial Consult  Pharmacy Consult for Heparin Indication: chest pain/ACS  Allergies  Allergen Reactions  . Amoxicillin Other (See Comments)    Yeast infection during treatment  . Hydrocodone Rash    Palpatations, gi upset  . Lisinopril Other (See Comments)    Chest pain, palpatations Other reaction(s): Other - See Comments Chest pain, palpatations  . Meloxicam Nausea Only  . Strawberry Extract Hives    Does not always happen  . Tape Rash    Some tapes cause rashes    Patient Measurements: Height: 5\' 8"  (172.7 cm) Weight: 242 lb 1 oz (109.8 kg) IBW/kg (Calculated) : 63.9 Heparin Dosing Weight: 88.9 kg  Vital Signs: Temp: 97.8 F (36.6 C) (07/29 1937) Temp Source: Oral (07/29 1937) BP: 138/75 (07/29 2000) Pulse Rate: 64 (07/29 2000)  Labs: No results for input(s): HGB, HCT, PLT, APTT, LABPROT, INR, HEPARINUNFRC, HEPRLOWMOCWT, CREATININE, CKTOTAL, CKMB, TROPONINI in the last 72 hours.  Estimated Creatinine Clearance: 43.5 mL/min (A) (by C-G formula based on SCr of 1.83 mg/dL (H)).   Medical History: Past Medical History:  Diagnosis Date  . AKI (acute kidney injury) ([place]) 02/2017  . Anemia   . CAD in native artery    a. NSTEMI 02/2017 s/p DES to mLAD, diffuse disease otherwise, EF 30-35%. b. Staged cath 03/2017 - patent stent in the proximal to mid LAD. CTO of the distal LAD, 90% diffuse small first diagonal, 90% distal LCx/OM, 80% diffuse prox RCA, moderately elevated LVEDP 51mmHg, s/p DES to mRCA, residual disease treated medically (too small for PCI), EF 30-35%.  . Chronic combined systolic and diastolic CHF (congestive heart failure) ([place])   . CKD (chronic kidney disease), stage III ([place])   . Diabetes mellitus, type II, insulin dependent ([place])   . Hyperlipidemia   . Hypertension   . Ischemic cardiomyopathy     Medications:  Medications Prior to Admission  Medication Sig Dispense Refill Last Dose  . aspirin 81 MG chewable tablet  Chew 1 tablet (81 mg total) by mouth daily.   Taking  . atorvastatin (LIPITOR) 80 MG tablet Take 1 tablet (80 mg total) by mouth daily at 6 PM. 90 tablet 1 Taking  . carvedilol (COREG) 6.25 MG tablet Take 3 tablets twice a day 180 tablet 3 Taking  . furosemide (LASIX) 20 MG tablet Take 1 tablet (20 mg total) by mouth every other day. 15 tablet 4   . gabapentin (NEURONTIN) 300 MG capsule Take 600 mg by mouth at bedtime.   Taking  . insulin detemir (LEVEMIR) 100 UNIT/ML injection Inject 0.25 mLs (25 Units total) into the skin 2 (two) times daily. Patient uses sliding scale at home per primary care provider.   Taking  . isosorbide mononitrate (IMDUR) 30 MG 24 hr tablet Take 1 tablet (30 mg total) by mouth daily. 30 tablet 11   . Multiple Vitamin (MULTIVITAMIN WITH MINERALS) TABS tablet Take 1 tablet by mouth daily.   Taking  . nitroGLYCERIN (NITROSTAT) 0.4 MG SL tablet Place 1 tablet (0.4 mg total) under the tongue every 5 (five) minutes as needed for chest pain. 25 tablet 2 Taking  . pantoprazole (PROTONIX) 40 MG tablet Take 1 tablet (40 mg total) by mouth 2 (two) times daily. 180 tablet 1 Taking  . potassium chloride SA (K-DUR,KLOR-CON) 20 MEQ tablet Take 1 tablet (20 mEq total) by mouth daily. 90 tablet 1   . sacubitril-valsartan (ENTRESTO) 49-51 MG Take 1 tablet by mouth 2 (two) times daily. 60 tablet  3   . Saline 0.2 % SOLN Place 1 spray into the nose daily as needed (congestion).   Taking  . spironolactone (ALDACTONE) 25 MG tablet Take 0.5 tablets (12.5 mg total) by mouth at bedtime. 15 tablet 3 Taking  . ticagrelor (BRILINTA) 90 MG TABS tablet Take 1 tablet (90 mg total) by mouth 2 (two) times daily. 180 tablet 2 Taking  . tiZANidine (ZANAFLEX) 4 MG tablet Take 4 mg by mouth at bedtime as needed for muscle spasms.   Taking    Assessment: Stephanie Manning is a 57 y.o. female was transferred here for unstable angina. Pharmacy is consulted to start heparin. Per chart from [facility], pt was given a  heparin bolus of 7500 units today before 0900. This morning Hgb was 9.8 and platelets were 195. Pt confirmed she was not on anticoagulation prior to admission.   Goal of Therapy:  Heparin level 0.3-0.7 units/ml Monitor platelets by anticoagulation protocol: Yes   Plan:  Give 4000 units bolus x 1 Start heparin infusion at 1200 units/hr Check anti-Xa level in 6 hours and daily while on heparin Continue to monitor H&H and platelets   Thank you for allowing [name] to participate in this patients care.   Jens Som, PharmD Please utilize [facility] (under [place]) for appropriate number for your unit pharmacist. 10/10/2017 8:41 PM

## 2017-10-10 NOTE — H&P (Addendum)
Cardiology Admission History and Physical:   Patient ID: Stephanie Manning; MRN: [id number]; DOB: 11-06-1960   Admission date: 10/10/2017  Primary Care Provider: [name], MD Primary Cardiologist:DR. [name] CHF: Dr. [name]  Chief Complaint:  Chest pain   Patient Profile:   Stephanie Manning is a 57 y.o. female with a history of CAD status post DES to mid LAD and RCA, ischemic cardiomyopathy, chronic kidney disease stage III, diabetes, hypertension and hyperlipidemia transferred from [facility] for unstable angina.  In 12/18, she had anterior MI with DES to totally occluded mid LAD (culprit). She returned in 1/19 for staged DES to 80% RCA stenosis. Echo in 12/18 showed EF 30-35%.  Repeat echo in 3/19 showed EF stable at 30-35%.  Follows by Dr. [name] for heart failure.  Started on Entresto and Spironolactone. Increased Entresto to 49/51 bid on 7/16. Plan to follow up with Dr. Rayann Heman as outpatient for ICD.   History of Present Illness:   Stephanie Manning transfer from [facility] rocking home with symptoms concerning for unstable angina.  At baseline, patient has intermittent chest pain requiring sublingual nitroglycerin 2 or 3 times per week.  This episode started yesterday while at church resolved with nitroglycerin x 1.  She had a recurrent chest pain last night at 11 pm x resolved with nitro.  However, she had a recurrent chest pain at 3 AM this morning waking up from sleep.  Felt like prior MI.  Radiating to her back and jaw.  Minimally improved with nitro and went to ER for further evaluation.  Pain improved with Nitropaste and transferred to [facility]  Currently chest pain-free.  Denies orthopnea, PND, syncope, lower extremity edema or melena.    Troponin negative.  Creatinine elevated at 2.02 (worsened from baseline).  Compliant with medication.   Past Medical History:  Diagnosis Date  . AKI (acute kidney injury) ([place]) 02/2017  . Anemia   . CAD in native artery    a.  NSTEMI 02/2017 s/p DES to mLAD, diffuse disease otherwise, EF 30-35%. b. Staged cath 03/2017 - patent stent in the proximal to mid LAD. CTO of the distal LAD, 90% diffuse small first diagonal, 90% distal LCx/OM, 80% diffuse prox RCA, moderately elevated LVEDP 20mmHg, s/p DES to mRCA, residual disease treated medically (too small for PCI), EF 30-35%.  . Chronic combined systolic and diastolic CHF (congestive heart failure) ([place])   . CKD (chronic kidney disease), stage III ([place])   . Diabetes mellitus, type II, insulin dependent ([place])   . Hyperlipidemia   . Hypertension   . Ischemic cardiomyopathy     Past Surgical History:  Procedure Laterality Date  . BILATERAL OOPHORECTOMY    . CESAREAN SECTION    . CORONARY STENT INTERVENTION N/A 04/01/2017   Procedure: CORONARY STENT INTERVENTION;  Surgeon: [name], [name], MD;  Location: [place] [facility];  Service: Cardiovascular;  Laterality: N/A;  . CORONARY STENT PLACEMENT  04/01/2017   drug-eluting stent was successfully placed using a STENT SYNERGY DES 5.45G25  . CORONARY/GRAFT ACUTE MI REVASCULARIZATION N/A 03/10/2017   Procedure: Coronary/Graft Acute MI Revascularization;  Surgeon: [name], [name], MD;  Location: [place] [facility];  Service: Cardiovascular;  Laterality: N/A;  . LEFT HEART CATH AND CORONARY ANGIOGRAPHY N/A 03/10/2017   Procedure: LEFT HEART CATH AND CORONARY ANGIOGRAPHY;  Surgeon: [name], [name], MD;  Location: [place] [facility];  Service: Cardiovascular;  Laterality: N/A;  . LEFT HEART CATH AND CORONARY ANGIOGRAPHY N/A 04/01/2017  Procedure: LEFT HEART CATH AND CORONARY ANGIOGRAPHY;  Surgeon: [name], [name], MD;  Location: [place] [facility];  Service: Cardiovascular;  Laterality: N/A;     Medications Prior to Admission: Prior to Admission medications   Medication Sig Start Date End Date Taking? Authorizing Provider  aspirin 81 MG chewable tablet Chew 1 tablet (81 mg total) by mouth daily. 03/17/17   Cheryln Manly, NP    atorvastatin (LIPITOR) 80 MG tablet Take 1 tablet (80 mg total) by mouth daily at 6 PM. 06/14/17   Lendon Colonel, NP  carvedilol (COREG) 6.25 MG tablet Take 3 tablets twice a day 07/28/17   [name], [name], MD  furosemide (LASIX) 20 MG tablet Take 1 tablet (20 mg total) by mouth every other day. 09/27/17   [name], MD  gabapentin (NEURONTIN) 300 MG capsule Take 600 mg by mouth at bedtime. 02/08/17   [provider]  insulin detemir (LEVEMIR) 100 UNIT/ML injection Inject 0.25 mLs (25 Units total) into the skin 2 (two) times daily. Patient uses sliding scale at home per primary care provider. 04/03/17   Dunn, Nedra Hai, PA-C  isosorbide mononitrate (IMDUR) 30 MG 24 hr tablet Take 1 tablet (30 mg total) by mouth daily. 09/27/17 09/27/18  [name], MD  Multiple Vitamin (MULTIVITAMIN WITH MINERALS) TABS tablet Take 1 tablet by mouth daily.    [provider]  nitroGLYCERIN (NITROSTAT) 0.4 MG SL tablet Place 1 tablet (0.4 mg total) under the tongue every 5 (five) minutes as needed for chest pain. 03/16/17   Cheryln Manly, NP  pantoprazole (PROTONIX) 40 MG tablet Take 1 tablet (40 mg total) by mouth 2 (two) times daily. 06/14/17   Lendon Colonel, NP  potassium chloride SA (K-DUR,KLOR-CON) 20 MEQ tablet Take 1 tablet (20 mEq total) by mouth daily. 09/27/17   [name], MD  sacubitril-valsartan (ENTRESTO) 49-51 MG Take 1 tablet by mouth 2 (two) times daily. 09/27/17   [name], MD  Saline 0.2 % SOLN Place 1 spray into the nose daily as needed (congestion).    [provider]  spironolactone (ALDACTONE) 25 MG tablet Take 0.5 tablets (12.5 mg total) by mouth at bedtime. 08/02/17 10/31/17  [name], MD  ticagrelor (BRILINTA) 90 MG TABS tablet Take 1 tablet (90 mg total) by mouth 2 (two) times daily. 03/16/17   Cheryln Manly, NP  tiZANidine (ZANAFLEX) 4 MG tablet Take 4 mg by mouth at bedtime as needed for muscle spasms. 02/08/17   [provider]     Allergies:    Allergies  Allergen Reactions  . Amoxicillin Other (See Comments)    Yeast infection during treatment  . Hydrocodone Rash    Palpatations, gi upset  . Lisinopril Other (See Comments)    Chest pain, palpatations Other reaction(s): Other - See Comments Chest pain, palpatations  . Meloxicam Nausea Only  . Strawberry Extract Hives    Does not always happen  . Tape Rash    Some tapes cause rashes    Social History:   Social History   Socioeconomic History  . Marital status: Single    Spouse name: Not on file  . Number of children: Not on file  . Years of education: Not on file  . Highest education level: Not on file  Occupational History  . Not on file  Social Needs  . Financial resource strain: Not on file  . Food insecurity:    Worry: Not  on file    Inability: Not on file  . Transportation needs:    Medical: Not on file    Non-medical: Not on file  Tobacco Use  . Smoking status: Never Smoker  . Smokeless tobacco: Never Used  Substance and Sexual Activity  . Alcohol use: No    Frequency: Never  . Drug use: No  . Sexual activity: Not on file  Lifestyle  . Physical activity:    Days per week: Not on file    Minutes per session: Not on file  . Stress: Not on file  Relationships  . Social connections:    Talks on phone: Not on file    Gets together: Not on file    Attends religious service: Not on file    Active member of club or organization: Not on file    Attends meetings of clubs or organizations: Not on file    Relationship status: Not on file  . Intimate partner violence:    Fear of current or ex partner: Not on file    Emotionally abused: Not on file    Physically abused: Not on file    Forced sexual activity: Not on file  Other Topics Concern  . Not on file  Social History Narrative  . Not on file    Family History: The patient's family history includes Diabetes in her mother; Heart disease in her mother;  Hypertension in her mother; Stroke in her mother.    ROS:  Please see the history of present illness.  All other ROS reviewed and negative.     Physical Exam/Data:  There were no vitals filed for this visit. No intake or output data in the 24 hours ending 10/10/17 1937 There were no vitals filed for this visit. There is no height or weight on file to calculate BMI.  General:  Well nourished, well developed, in no acute distress HEENT: normal Lymph: no adenopathy Neck: no JVD Endocrine:  No thryomegaly Vascular: No carotid bruits; FA pulses 2+ bilaterally without bruits  Cardiac:  normal S1, S2; RRR; no murmur Lungs:  clear to auscultation bilaterally, no wheezing, rhonchi or rales  Abd: soft, nontender, no hepatomegaly  Ext: n0  edema Musculoskeletal:  No deformities, BUE and BLE strength normal and equal Skin: warm and dry  Neuro:  CNs 2-12 intact, no focal abnormalities noted Psych:  Normal affect    EKG:  The ECG that was done at [facility] was personally reviewed and demonstrates:   Relevant CV Studies:  Echo 06/07/17 Study Conclusions  - Procedure narrative: Transthoracic echocardiography. Image   quality was suboptimal. The study was technically difficult.   Intravenous contrast (Definity) was administered. - Left ventricle: The cavity size was mildly dilated. Systolic   function was moderately to severely reduced. The estimated   ejection fraction was in the range of 30% to 35%. Akinesis and   scarring of the mid-apicalanteroseptal, anterior, and apical   myocardium; consistent with infarction in the distribution of the   left anterior descending coronary artery. Severe hypokinesis of   the inferolateral myocardium; consistent with ischemia in the   distribution of the left circumflex coronary artery. Features are   consistent with a pseudonormal left ventricular filling pattern,   with concomitant abnormal relaxation and increased filling   pressure (grade 2 diastolic  dysfunction). Acoustic contrast   opacification revealed no evidence ofthrombus. - Left atrium: The atrium was mildly dilated. - Right ventricle: The cavity size was mildly dilated.  CORONARY  STENT INTERVENTION  04/01/17  LEFT HEART CATH AND CORONARY ANGIOGRAPHY  Conclusion     Dist LAD lesion is 100% stenosed.  Previously placed Prox LAD drug eluting stent is widely patent.  Ost 1st Diag to 1st Diag lesion is 90% stenosed.  Prox Cx to Mid Cx lesion is 50% stenosed.  Dist Cx lesion is 90% stenosed.  3rd Mrg lesion is 90% stenosed.  Mid Cx lesion is 85% stenosed.  Prox RCA to Mid RCA lesion is 80% stenosed.  A drug-eluting stent was successfully placed using a STENT SYNERGY DES [id number].  Post intervention, there is a 0% residual stenosis.  LV end diastolic pressure is moderately elevated.   1. 3 vessel obstructive CAD   - patent stent in the proximal to mid LAD. CTO of the distal LAD   - 90% diffuse small first diagonal   - 90% distal LCx/OM   - 80% diffuse proximal RCA 2. Moderately elevated LVEDP- 28 mm Hg 3. Successful stenting of the proximal to mid RCA with DES  Plan: continue DAPT for at least one year. Will start lasix for elevated LVEDP. Add nitrates for treatment of residual disease-vessels too small for PCI. Anticipate DC in am.   Radiology/Studies:  No results found.  Assessment and Plan:   1. Unstable angina -Symptoms similar to prior MI.  Chest pain-free on Nitropaste.  Cycle troponin.  Start IV heparin.  Start hydration at 50 mL/h.  Likely cath in a.m. pending renal function.  2.  Acute on chronic kidney disease stage III -Baseline creatinine around 1.6.  Hydration as above.  Hold Spironolactone, Lasix and Entresto.  3.  Ischemic cardiomyopathy -Hold medication as above.  Watch volume status closely.   Severity of Illness: The appropriate patient status for this patient is INPATIENT. Inpatient status is judged to be reasonable and necessary in  order to provide the required intensity of service to ensure the patient's safety. The patient's presenting symptoms, physical exam findings, and initial radiographic and laboratory data in the context of their chronic comorbidities is felt to place them at high risk for further clinical deterioration. Furthermore, it is not anticipated that the patient will be medically stable for discharge from the hospital within 2 midnights of admission. The following factors support the patient status of inpatient.   " The patient's presenting symptoms include Chest pain . " The worrisome physical exam findings include none " The initial radiographic and laboratory data are worrisome because of none " The chronic co-morbidities include ICM   * I certify that at the point of admission it is my clinical judgment that the patient will require inpatient hospital care spanning beyond 2 midnights from the point of admission due to high intensity of service, high risk for further deterioration and high frequency of surveillance required.*    For questions or updates, please contact [place] Please consult [email or web address] for contact info under Cardiology/STEMI.    Mahalia Longest [place], [place]  10/10/2017 7:37 PM   Attending note Patient seen and discussed with PA Bhagat, I agree with his documentation. 57 yo female history of CAD with prior DES to mid LAD in setting of anterior MI, staged DES to RCA, ICM LVEF 30-35%, [facility] from [facility] with chest pain. Progressing chest pressure concerning for unstable angina. We will plan for cath tomorrow pending renal function. Gentle IVFs overnight, hold entresto/diuretic/aldactone.    Carlyle Dolly MD

## 2017-10-11 ENCOUNTER — Inpatient Hospital Stay (HOSPITAL_COMMUNITY): Payer: Medicare Other

## 2017-10-11 DIAGNOSIS — R079 Chest pain, unspecified: Secondary | ICD-10-CM

## 2017-10-11 DIAGNOSIS — E7800 Pure hypercholesterolemia, unspecified: Secondary | ICD-10-CM

## 2017-10-11 DIAGNOSIS — I2510 Atherosclerotic heart disease of native coronary artery without angina pectoris: Secondary | ICD-10-CM

## 2017-10-11 DIAGNOSIS — I110 Hypertensive heart disease with heart failure: Secondary | ICD-10-CM

## 2017-10-11 DIAGNOSIS — I5042 Chronic combined systolic (congestive) and diastolic (congestive) heart failure: Secondary | ICD-10-CM

## 2017-10-11 LAB — GLUCOSE, CAPILLARY
Glucose-Capillary: 139 mg/dL — ABNORMAL HIGH (ref 70–99)
Glucose-Capillary: 124 mg/dL — ABNORMAL HIGH (ref 70–99)
Glucose-Capillary: 186 mg/dL — ABNORMAL HIGH (ref 70–99)
Glucose-Capillary: 158 mg/dL — ABNORMAL HIGH (ref 70–99)

## 2017-10-11 LAB — CBC
HEMATOCRIT: 33.6 % — AB (ref 36.0–46.0)
Hemoglobin: 10.3 g/dL — ABNORMAL LOW (ref 12.0–15.0)
MCH: 28.1 pg (ref 26.0–34.0)
MCHC: 30.7 g/dL (ref 30.0–36.0)
MCV: 91.6 fL (ref 78.0–100.0)
Platelets: 181 10*3/uL (ref 150–400)
RBC: 3.67 MIL/uL — AB (ref 3.87–5.11)
RDW: 14.6 % (ref 11.5–15.5)
WBC: 7.7 10*3/uL (ref 4.0–10.5)

## 2017-10-11 LAB — NM MYOCAR MULTI W/SPECT W/WALL MOTION / EF
CHL CUP RESTING HR STRESS: 63 {beats}/min
CSEPEDS: 20 s
Estimated workload: 1 METS
Exercise duration (min): 7 min
MPHR: 162 {beats}/min
Peak HR: 95 {beats}/min
Percent HR: 58 %

## 2017-10-11 LAB — BASIC METABOLIC PANEL
Anion gap: 8 (ref 5–15)
BUN: 27 mg/dL — AB (ref 6–20)
CALCIUM: 9.2 mg/dL (ref 8.9–10.3)
CHLORIDE: 111 mmol/L (ref 98–111)
CO2: 23 mmol/L (ref 22–32)
CREATININE: 1.79 mg/dL — AB (ref 0.44–1.00)
GFR calc non Af Amer: 30 mL/min — ABNORMAL LOW (ref >60)
GFR, EST AFRICAN AMERICAN: 35 mL/min — AB (ref >60)
Glucose, Bld: 154 mg/dL — ABNORMAL HIGH (ref 70–99)
Potassium: 4.7 mmol/L (ref 3.5–5.1)
SODIUM: 142 mmol/L (ref 135–145)

## 2017-10-11 LAB — HIV ANTIBODY (ROUTINE TESTING W REFLEX): HIV Screen 4th Generation wRfx: NONREACTIVE

## 2017-10-11 LAB — HEPARIN LEVEL (UNFRACTIONATED)
HEPARIN UNFRACTIONATED: 0.48 [IU]/mL (ref 0.30–0.70)
Heparin Unfractionated: 1.05 IU/mL — ABNORMAL HIGH (ref 0.30–0.70)
Heparin Unfractionated: 0.97 IU/mL — ABNORMAL HIGH (ref 0.30–0.70)

## 2017-10-11 LAB — TROPONIN I

## 2017-10-11 MED ORDER — ANGIOPLASTY BOOK
Freq: Once | Status: AC
  Administered 2017-10-12: 06:00:00
  Filled 2017-10-11: qty 1

## 2017-10-11 MED ORDER — ASPIRIN 81 MG PO CHEW
81.0000 mg | CHEWABLE_TABLET | ORAL | Status: AC
  Administered 2017-10-12: 81 mg via ORAL
  Filled 2017-10-11: qty 1

## 2017-10-11 MED ORDER — SODIUM CHLORIDE 0.9% FLUSH
3.0000 mL | INTRAVENOUS | Status: DC | PRN
Start: 2017-10-11 — End: 2017-10-12

## 2017-10-11 MED ORDER — TECHNETIUM TC 99M TETROFOSMIN IV KIT
10.0000 | PACK | Freq: Once | INTRAVENOUS | Status: AC | PRN
  Administered 2017-10-11: 10 via INTRAVENOUS

## 2017-10-11 MED ORDER — SODIUM CHLORIDE 0.9 % IV SOLN: 250.0000 mL | INTRAVENOUS | Status: DC | PRN

## 2017-10-11 MED ORDER — SODIUM CHLORIDE 0.9 % IV SOLN: INTRAVENOUS | Status: DC

## 2017-10-11 MED ORDER — REGADENOSON 0.4 MG/5ML IV SOLN
0.4000 mg | Freq: Once | INTRAVENOUS | Status: AC
  Administered 2017-10-11: 0.4 mg via INTRAVENOUS
  Filled 2017-10-11: qty 5

## 2017-10-11 MED ORDER — REGADENOSON 0.4 MG/5ML IV SOLN
INTRAVENOUS | Status: AC
  Filled 2017-10-11: qty 5

## 2017-10-11 MED ORDER — TECHNETIUM TC 99M TETROFOSMIN IV KIT
30.0000 | PACK | Freq: Once | INTRAVENOUS | Status: AC | PRN
  Administered 2017-10-11: 30 via INTRAVENOUS

## 2017-10-11 MED ORDER — SODIUM CHLORIDE 0.9% FLUSH
3.0000 mL | Freq: Two times a day (BID) | INTRAVENOUS | Status: DC
  Administered 2017-10-12: 10:00:00 3 mL via INTRAVENOUS

## 2017-10-11 NOTE — Progress Notes (Signed)
ANTICOAGULATION CONSULT NOTE - Follow Up Consult  Pharmacy Consult for heparin Indication: USAP  Labs: Recent Labs    10/10/17 2035 10/11/17 0227  HEPARINUNFRC  --  1.05*  TROPONINI <0.03 <0.03    Assessment: 57yo female supratherapeutic on heparin with initial dosing for CP.  Goal of Therapy:  Heparin level 0.3-0.7 units/ml   Plan:  Will decrease heparin gtt by 3 units/kg/hr to 900 units/hr and check level in 6 hours.    Wynona Neat, PharmD, BCPS  10/11/2017,3:21 AM

## 2017-10-11 NOTE — Progress Notes (Addendum)
[place] for Heparin Indication: chest pain/ACS  Allergies  Allergen Reactions  . Amoxicillin Other (See Comments)    Yeast infection during treatment  . Hydrocodone Palpitations, Rash and Other (See Comments)    GI upset, also  . Lisinopril Palpitations and Other (See Comments)    Chest pain, too  . Meloxicam Nausea Only  . Strawberry Extract Hives    Does not always happen  . Tape Rash    Some tapes cause rashes  . Toradol [Ketorolac Tromethamine] Palpitations    Patient Measurements: Height: 5\' 8"  (172.7 cm) Weight: 242 lb 1 oz (109.8 kg) IBW/kg (Calculated) : 63.9 Heparin Dosing Weight: 88.9 kg  Vital Signs: Temp: 98 F (36.7 C) (07/30 0835) Temp Source: Oral (07/30 0835) BP: 128/70 (07/30 0835) Pulse Rate: 65 (07/30 0835)  Labs: Recent Labs    10/10/17 2035 10/11/17 0227 10/11/17 0840  HGB  --   --  10.3*  HCT  --   --  33.6*  PLT  --   --  181  HEPARINUNFRC  --  1.05* 0.97*  TROPONINI <0.03 <0.03  --     Estimated Creatinine Clearance: 43.5 mL/min (A) (by C-G formula based on SCr of 1.83 mg/dL (H)).   Medical History: Past Medical History:  Diagnosis Date  . AKI (acute kidney injury) ([place]) 02/2017  . CAD in native artery    a. NSTEMI 02/2017 s/p DES to mLAD, diffuse disease otherwise, EF 30-35%. b. Staged cath 03/2017 - patent stent in the proximal to mid LAD. CTO of the distal LAD, 90% diffuse small first diagonal, 90% distal LCx/OM, 80% diffuse prox RCA, moderately elevated LVEDP 43mmHg, s/p DES to mRCA, residual disease treated medically (too small for PCI), EF 30-35%.  . Chronic combined systolic and diastolic CHF (congestive heart failure) ([place])   . CKD (chronic kidney disease), stage IV ([place])    [name] 04/21/2017  . Diabetes mellitus, type II, insulin dependent ([place])   . GERD (gastroesophageal reflux disease)   . History of blood transfusion    "related to menses"  . History of stomach ulcers 2017  .  Hyperlipidemia   . Hypertension   . Iron deficiency anemia 03/2017   "had to have iron infusions" (10/10/2017)  . Ischemic cardiomyopathy   . STEMI (ST elevation myocardial infarction) ([place]) 02/2017    Assessment: 57 y.o. female was transferred here for unstable angina. Pharmacy consulted to start heparin. Plans noted for nuclear stress test. -heparin level is 0.97 after decrease to 900 units/hr   Goal of Therapy:  Heparin level 0.3-0.7 units/ml Monitor platelets by anticoagulation protocol: Yes   Plan:  -Decrease heparin to 600 units/hr -Heparin level in 6 hours and daily wth CBC daily  Thank you for allowing [name] to participate in this patients care.  Hildred Laser, PharmD Clinical Pharmacist Please check Amion for pharmacy contact number   10/11/2017 9:30 AM

## 2017-10-11 NOTE — Progress Notes (Signed)
Progress Note  Patient Name: Stephanie Manning Date of Encounter: 10/11/2017  Primary Cardiologist: Peter [name], MD  Heart failure: Dr. [name]  Subjective   Feeling well. No recurrent chest pain.   Inpatient Medications    Scheduled Meds: . aspirin EC  81 mg Oral Daily  . atorvastatin  80 mg Oral q1800  . carvedilol  6.25 mg Oral BID WC  . gabapentin  600 mg Oral QHS  . insulin aspart  0-15 Units Subcutaneous TID WC  . nitroGLYCERIN  1 inch Topical Q6H  . pantoprazole  40 mg Oral BID  . ticagrelor  90 mg Oral BID   Continuous Infusions: . sodium chloride 50 mL/hr at 10/11/17 0600  . heparin 600 Units/hr (10/11/17 1012)   PRN Meds: acetaminophen, nitroGLYCERIN, ondansetron (ZOFRAN) IV, tiZANidine   Vital Signs    Vitals:   10/11/17 0400 10/11/17 0600 10/11/17 0835 10/11/17 1138  BP: 117/62 126/66 128/70 132/78  Pulse: 64 60 65 62  Resp: 12 16 16 16   Temp:  97.8 F (36.6 C) 98 F (36.7 C)   TempSrc:  Oral Oral   SpO2: 99% 99% 100% 100%  Weight:  242 lb 1 oz (109.8 kg)    Height:        Intake/Output Summary (Last 24 hours) at 10/11/2017 1152 Last data filed at 10/11/2017 0600 Gross per 24 hour  Intake 719.5 ml  Output 1200 ml  Net -480.5 ml   Filed Weights   10/10/17 1937 10/11/17 0600  Weight: 242 lb 1 oz (109.8 kg) 242 lb 1 oz (109.8 kg)    Telemetry    Sinus rhythm.  Ventriclar bigeminy - Personally Reviewed  ECG    Sinus rhythm.  Rate 61 bpm.  Inferolateral TWI - Personally Reviewed  Physical Exam   VS:  BP 132/78 (BP Location: Left Arm)   Pulse 62   Temp 98 F (36.7 C) (Oral)   Resp 16   Ht 5\' 8"  (1.727 m)   Wt 242 lb 1 oz (109.8 kg)   SpO2 100%   BMI 36.81 kg/m  , BMI Body mass index is 36.81 kg/m. GENERAL:  Well appearing HEENT: Pupils equal round and reactive, fundi not visualized, oral mucosa unremarkable NECK:  No jugular venous distention, waveform within normal limits, carotid upstroke brisk and symmetric, no  bruits LUNGS:  Clear to auscultation bilaterally HEART:  RRR.  PMI not displaced or sustained,S1 and S2 within normal limits, no S3, no S4, no clicks, no rubs, no murmurs ABD:  Flat, positive bowel sounds normal in frequency in pitch, no bruits, no rebound, no guarding, no midline pulsatile mass, no hepatomegaly, no splenomegaly EXT:  2 plus pulses throughout, no edema, no cyanosis no clubbing SKIN:  No rashes no nodules NEURO:  Cranial nerves II through XII grossly intact, motor grossly intact throughout [facility]:  Cognitively intact, oriented to person place and time    Labs    Chemistry Recent Labs  Lab 10/11/17 0840  NA 142  K 4.7  CL 111  CO2 23  GLUCOSE 154*  BUN 27*  CREATININE 1.79*  CALCIUM 9.2  GFRNONAA 30*  GFRAA 35*  ANIONGAP 8     Hematology Recent Labs  Lab 10/11/17 0840  WBC 7.7  RBC 3.67*  HGB 10.3*  HCT 33.6*  MCV 91.6  MCH 28.1  MCHC 30.7  RDW 14.6  PLT 181    Cardiac Enzymes Recent Labs  Lab 10/10/17 2035 10/11/17 0227 10/11/17 0840  TROPONINI <  0.03 <0.03 <0.03   No results for input(s): TROPIPOC in the last 168 hours.   BNPNo results for input(s): BNP, PROBNP in the last 168 hours.   DDimer No results for input(s): DDIMER in the last 168 hours.   Radiology    No results found.  Cardiac Studies   Echo 05/2017: Study Conclusions  - Procedure narrative: Transthoracic echocardiography. Image   quality was suboptimal. The study was technically difficult.   Intravenous contrast (Definity) was administered. - Left ventricle: The cavity size was mildly dilated. Systolic   function was moderately to severely reduced. The estimated   ejection fraction was in the range of 30% to 35%. Akinesis and   scarring of the mid-apicalanteroseptal, anterior, and apical   myocardium; consistent with infarction in the distribution of the   left anterior descending coronary artery. Severe hypokinesis of   the inferolateral myocardium; consistent  with ischemia in the   distribution of the left circumflex coronary artery. Features are   consistent with a pseudonormal left ventricular filling pattern,   with concomitant abnormal relaxation and increased filling   pressure (grade 2 diastolic dysfunction). Acoustic contrast   opacification revealed no evidence of thrombus. - Left atrium: The atrium was mildly dilated. - Right ventricle: The cavity size was mildly dilated.  LHC 04/01/17:  Dist LAD lesion is 100% stenosed.  Previously placed Prox LAD drug eluting stent is widely patent.  Ost 1st Diag to 1st Diag lesion is 90% stenosed.  Prox Cx to Mid Cx lesion is 50% stenosed.  Dist Cx lesion is 90% stenosed.  3rd Mrg lesion is 90% stenosed.  Mid Cx lesion is 85% stenosed.  Prox RCA to Mid RCA lesion is 80% stenosed.  A drug-eluting stent was successfully placed using a STENT SYNERGY DES [id number].  Post intervention, there is a 0% residual stenosis.  LV end diastolic pressure is moderately elevated.   1. 3 vessel obstructive CAD   - patent stent in the proximal to mid LAD. CTO of the distal LAD   - 90% diffuse small first diagonal   - 90% distal LCx/OM   - 80% diffuse proximal RCA 2. Moderately elevated LVEDP- 28 mm Hg 3. Successful stenting of the proximal to mid RCA with DES  Patient Profile     57 y.o. female with chronic systolic and diastolic heart failure, CAD s/p PCI and residual distal obstructive disease, CKD III, diabetes, hypertension, and hyperlipidemia here with chest pain.  Assessment & Plan    # Angina:  # Obstructive CAD:   Ms. Curfman had an episode of chest pain that awakened her from sleep.  It is not exertional.  Cardiac enzymes have been negative.  EKG is unchanged from prior.  Her symptoms are atypical and she has acute on chronic renal failure.  Therefore would like to spare her getting contrast if possible.  We will get a Lexiscan Myoview to assess for areas of significant ischemia.  She has a  history of GERD and this could be contributing as well.  Continue pantoprazole and will try GI cocktail she has recurrent chest pain.  She has known disease in her D1, distal LAD, and left circumflex.  These areas were deemed too small for stenting.  Continue aspirin, atorvastatin, carvedilol, ticagrelor, and Nitropaste.  Resume Imdur and likely increase dose based on findings of Lexiscan Myoview.  # Chronic systolic and diastolic heart failure: Resume Entresto at 24/26mg  once renal function stabilizes given her dizziness since increasing the dose.  Continue carvedilol.  Currently holding Entresto and spironolactone 2/2 acute on chronic kidney disease.           For questions or updates, please contact [place] Please consult [email or web address] for contact info under Cardiology/STEMI.      Signed, Skeet Latch, MD  10/11/2017, 11:52 AM

## 2017-10-11 NOTE — Progress Notes (Signed)
   Stephanie Manning presented for a nuclear stress test today.  No immediate complications.  Stress imaging is pending at this time.  Preliminary EKG findings may be listed in the chart, but the stress test result will not be finalized until perfusion imaging is complete.   Kathyrn Drown, NP-C 10/11/2017, 1:36 PM

## 2017-10-11 NOTE — Progress Notes (Signed)
ANTICOAGULATION CONSULT NOTE  Pharmacy Consult:  Heparin Indication: chest pain/ACS  Allergies  Allergen Reactions  . Amoxicillin Other (See Comments)    Yeast infection during treatment  . Hydrocodone Palpitations, Rash and Other (See Comments)    GI upset, also  . Lisinopril Palpitations and Other (See Comments)    Chest pain, too  . Meloxicam Nausea Only  . Strawberry Extract Hives    Does not always happen  . Tape Rash    Some tapes cause rashes  . Toradol [Ketorolac Tromethamine] Palpitations    Patient Measurements: Height: 5\' 8"  (172.7 cm) Weight: 242 lb 1 oz (109.8 kg) IBW/kg (Calculated) : 63.9 Heparin Dosing Weight: 89 kg  Vital Signs: Temp: 98.2 F (36.8 C) (07/30 1633) Temp Source: Oral (07/30 1633) BP: 156/67 (07/30 1633) Pulse Rate: 66 (07/30 1633)  Labs: Recent Labs    10/10/17 2035 10/11/17 0227 10/11/17 0840 10/11/17 1635  HGB  --   --  10.3*  --   HCT  --   --  33.6*  --   PLT  --   --  181  --   HEPARINUNFRC  --  1.05* 0.97* 0.48  CREATININE  --   --  1.79*  --   TROPONINI <0.03 <0.03 <0.03  --     Estimated Creatinine Clearance: 44.5 mL/min (A) (by C-G formula based on SCr of 1.79 mg/dL (H)).    Assessment: 14 YOF continues on IV heparin for [place].  Patient is s/p stress test today.  RN reported that heparin was infusing when patient returned from [place], so heparin was likely uninterrupted.    Heparin level is therapeutic post rate adjustment this AM.  No bleeding reported.   Goal of Therapy:  Heparin level 0.3-0.7 units/ml Monitor platelets by anticoagulation protocol: Yes    Plan:  Continue heparin gtt at 600 units/hr F/U AM labs   Shawnika Pepin D. Mina Marble, PharmD, BCPS, [place] 10/11/2017, 5:24 PM

## 2017-10-11 NOTE — H&P (View-Only) (Signed)
Progress Note  Patient Name: Stephanie Manning Date of Encounter: 10/11/2017  Primary Cardiologist: Peter [name], MD  Heart failure: Dr. [name]  Subjective   Feeling well. No recurrent chest pain.   Inpatient Medications    Scheduled Meds: . aspirin EC  81 mg Oral Daily  . atorvastatin  80 mg Oral q1800  . carvedilol  6.25 mg Oral BID WC  . gabapentin  600 mg Oral QHS  . insulin aspart  0-15 Units Subcutaneous TID WC  . nitroGLYCERIN  1 inch Topical Q6H  . pantoprazole  40 mg Oral BID  . ticagrelor  90 mg Oral BID   Continuous Infusions: . sodium chloride 50 mL/hr at 10/11/17 0600  . heparin 600 Units/hr (10/11/17 1012)   PRN Meds: acetaminophen, nitroGLYCERIN, ondansetron (ZOFRAN) IV, tiZANidine   Vital Signs    Vitals:   10/11/17 0400 10/11/17 0600 10/11/17 0835 10/11/17 1138  BP: 117/62 126/66 128/70 132/78  Pulse: 64 60 65 62  Resp: 12 16 16 16   Temp:  97.8 F (36.6 C) 98 F (36.7 C)   TempSrc:  Oral Oral   SpO2: 99% 99% 100% 100%  Weight:  242 lb 1 oz (109.8 kg)    Height:        Intake/Output Summary (Last 24 hours) at 10/11/2017 1152 Last data filed at 10/11/2017 0600 Gross per 24 hour  Intake 719.5 ml  Output 1200 ml  Net -480.5 ml   Filed Weights   10/10/17 1937 10/11/17 0600  Weight: 242 lb 1 oz (109.8 kg) 242 lb 1 oz (109.8 kg)    Telemetry    Sinus rhythm.  Ventriclar bigeminy - Personally Reviewed  ECG    Sinus rhythm.  Rate 61 bpm.  Inferolateral TWI - Personally Reviewed  Physical Exam   VS:  BP 132/78 (BP Location: Left Arm)   Pulse 62   Temp 98 F (36.7 C) (Oral)   Resp 16   Ht 5\' 8"  (1.727 m)   Wt 242 lb 1 oz (109.8 kg)   SpO2 100%   BMI 36.81 kg/m  , BMI Body mass index is 36.81 kg/m. GENERAL:  Well appearing HEENT: Pupils equal round and reactive, fundi not visualized, oral mucosa unremarkable NECK:  No jugular venous distention, waveform within normal limits, carotid upstroke brisk and symmetric, no  bruits LUNGS:  Clear to auscultation bilaterally HEART:  RRR.  PMI not displaced or sustained,S1 and S2 within normal limits, no S3, no S4, no clicks, no rubs, no murmurs ABD:  Flat, positive bowel sounds normal in frequency in pitch, no bruits, no rebound, no guarding, no midline pulsatile mass, no hepatomegaly, no splenomegaly EXT:  2 plus pulses throughout, no edema, no cyanosis no clubbing SKIN:  No rashes no nodules NEURO:  Cranial nerves II through XII grossly intact, motor grossly intact throughout [facility]:  Cognitively intact, oriented to person place and time    Labs    Chemistry Recent Labs  Lab 10/11/17 0840  NA 142  K 4.7  CL 111  CO2 23  GLUCOSE 154*  BUN 27*  CREATININE 1.79*  CALCIUM 9.2  GFRNONAA 30*  GFRAA 35*  ANIONGAP 8     Hematology Recent Labs  Lab 10/11/17 0840  WBC 7.7  RBC 3.67*  HGB 10.3*  HCT 33.6*  MCV 91.6  MCH 28.1  MCHC 30.7  RDW 14.6  PLT 181    Cardiac Enzymes Recent Labs  Lab 10/10/17 2035 10/11/17 0227 10/11/17 0840  TROPONINI <  0.03 <0.03 <0.03   No results for input(s): TROPIPOC in the last 168 hours.   BNPNo results for input(s): BNP, PROBNP in the last 168 hours.   DDimer No results for input(s): DDIMER in the last 168 hours.   Radiology    No results found.  Cardiac Studies   Echo 05/2017: Study Conclusions  - Procedure narrative: Transthoracic echocardiography. Image   quality was suboptimal. The study was technically difficult.   Intravenous contrast (Definity) was administered. - Left ventricle: The cavity size was mildly dilated. Systolic   function was moderately to severely reduced. The estimated   ejection fraction was in the range of 30% to 35%. Akinesis and   scarring of the mid-apicalanteroseptal, anterior, and apical   myocardium; consistent with infarction in the distribution of the   left anterior descending coronary artery. Severe hypokinesis of   the inferolateral myocardium; consistent  with ischemia in the   distribution of the left circumflex coronary artery. Features are   consistent with a pseudonormal left ventricular filling pattern,   with concomitant abnormal relaxation and increased filling   pressure (grade 2 diastolic dysfunction). Acoustic contrast   opacification revealed no evidence of thrombus. - Left atrium: The atrium was mildly dilated. - Right ventricle: The cavity size was mildly dilated.  LHC 04/01/17:  Dist LAD lesion is 100% stenosed.  Previously placed Prox LAD drug eluting stent is widely patent.  Ost 1st Diag to 1st Diag lesion is 90% stenosed.  Prox Cx to Mid Cx lesion is 50% stenosed.  Dist Cx lesion is 90% stenosed.  3rd Mrg lesion is 90% stenosed.  Mid Cx lesion is 85% stenosed.  Prox RCA to Mid RCA lesion is 80% stenosed.  A drug-eluting stent was successfully placed using a STENT SYNERGY DES [id number].  Post intervention, there is a 0% residual stenosis.  LV end diastolic pressure is moderately elevated.   1. 3 vessel obstructive CAD   - patent stent in the proximal to mid LAD. CTO of the distal LAD   - 90% diffuse small first diagonal   - 90% distal LCx/OM   - 80% diffuse proximal RCA 2. Moderately elevated LVEDP- 28 mm Hg 3. Successful stenting of the proximal to mid RCA with DES  Patient Profile     57 y.o. female with chronic systolic and diastolic heart failure, CAD s/p PCI and residual distal obstructive disease, CKD III, diabetes, hypertension, and hyperlipidemia here with chest pain.  Assessment & Plan    # Angina:  # Obstructive CAD:   Ms. Krull had an episode of chest pain that awakened her from sleep.  It is not exertional.  Cardiac enzymes have been negative.  EKG is unchanged from prior.  Her symptoms are atypical and she has acute on chronic renal failure.  Therefore would like to spare her getting contrast if possible.  We will get a Lexiscan Myoview to assess for areas of significant ischemia.  She has a  history of GERD and this could be contributing as well.  Continue pantoprazole and will try GI cocktail she has recurrent chest pain.  She has known disease in her D1, distal LAD, and left circumflex.  These areas were deemed too small for stenting.  Continue aspirin, atorvastatin, carvedilol, ticagrelor, and Nitropaste.  Resume Imdur and likely increase dose based on findings of Lexiscan Myoview.  # Chronic systolic and diastolic heart failure: Resume Entresto at 24/26mg  once renal function stabilizes given her dizziness since increasing the dose.  Continue carvedilol.  Currently holding Entresto and spironolactone 2/2 acute on chronic kidney disease.           For questions or updates, please contact [place] Please consult [email or web address] for contact info under Cardiology/STEMI.      Signed, Skeet Latch, MD  10/11/2017, 11:52 AM

## 2017-10-12 ENCOUNTER — Encounter (HOSPITAL_COMMUNITY)
Admission: AD | Disposition: A | Discharge status: Home / Self Care | Payer: Self-pay | Source: Home / Self Care | Attending: Cardiology

## 2017-10-12 ENCOUNTER — Encounter (HOSPITAL_COMMUNITY): Payer: Self-pay | Admitting: Cardiovascular Disease

## 2017-10-12 DIAGNOSIS — E7800 Pure hypercholesterolemia, unspecified: Secondary | ICD-10-CM

## 2017-10-12 DIAGNOSIS — I5042 Chronic combined systolic (congestive) and diastolic (congestive) heart failure: Secondary | ICD-10-CM

## 2017-10-12 DIAGNOSIS — I10 Essential (primary) hypertension: Secondary | ICD-10-CM

## 2017-10-12 DIAGNOSIS — I2510 Atherosclerotic heart disease of native coronary artery without angina pectoris: Secondary | ICD-10-CM

## 2017-10-12 HISTORY — PX: LEFT HEART CATH AND CORONARY ANGIOGRAPHY: CATH118249

## 2017-10-12 LAB — BASIC METABOLIC PANEL
Anion gap: 6 (ref 5–15)
BUN: 24 mg/dL — AB (ref 6–20)
CALCIUM: 9 mg/dL (ref 8.9–10.3)
CO2: 24 mmol/L (ref 22–32)
CREATININE: 1.73 mg/dL — AB (ref 0.44–1.00)
Chloride: 111 mmol/L (ref 98–111)
GFR calc Af Amer: 36 mL/min — ABNORMAL LOW (ref >60)
GFR, EST NON AFRICAN AMERICAN: 31 mL/min — AB (ref >60)
GLUCOSE: 201 mg/dL — AB (ref 70–99)
Potassium: 4.4 mmol/L (ref 3.5–5.1)
SODIUM: 141 mmol/L (ref 135–145)

## 2017-10-12 LAB — HEPARIN LEVEL (UNFRACTIONATED): HEPARIN UNFRACTIONATED: 0.2 [IU]/mL — AB (ref 0.30–0.70)

## 2017-10-12 LAB — CBC
HCT: 32.1 % — ABNORMAL LOW (ref 36.0–46.0)
Hemoglobin: 9.8 g/dL — ABNORMAL LOW (ref 12.0–15.0)
MCH: 27.8 pg (ref 26.0–34.0)
MCHC: 30.5 g/dL (ref 30.0–36.0)
MCV: 90.9 fL (ref 78.0–100.0)
PLATELETS: 175 10*3/uL (ref 150–400)
RBC: 3.53 MIL/uL — ABNORMAL LOW (ref 3.87–5.11)
RDW: 14.2 % (ref 11.5–15.5)
WBC: 7.3 10*3/uL (ref 4.0–10.5)

## 2017-10-12 LAB — GLUCOSE, CAPILLARY
GLUCOSE-CAPILLARY: 169 mg/dL — AB (ref 70–99)
Glucose-Capillary: 134 mg/dL — ABNORMAL HIGH (ref 70–99)
Glucose-Capillary: 154 mg/dL — ABNORMAL HIGH (ref 70–99)
Glucose-Capillary: 229 mg/dL — ABNORMAL HIGH (ref 70–99)

## 2017-10-12 SURGERY — LEFT HEART CATH AND CORONARY ANGIOGRAPHY
Anesthesia: LOCAL

## 2017-10-12 MED ORDER — SODIUM CHLORIDE 0.9 % WEIGHT BASED INFUSION
1.0000 mL/kg/h | INTRAVENOUS | Status: AC
  Administered 2017-10-12: 1 mL/kg/h via INTRAVENOUS

## 2017-10-12 MED ORDER — LIDOCAINE HCL (PF) 1 % IJ SOLN
INTRAMUSCULAR | Status: DC | PRN
  Administered 2017-10-12: 2 mL

## 2017-10-12 MED ORDER — IOPAMIDOL (ISOVUE-370) INJECTION 76%
INTRAVENOUS | Status: DC | PRN
  Administered 2017-10-12: 50 mL via INTRA_ARTERIAL

## 2017-10-12 MED ORDER — VERAPAMIL HCL 2.5 MG/ML IV SOLN
INTRAVENOUS | Status: DC | PRN
  Administered 2017-10-12: 10:00:00 via INTRA_ARTERIAL

## 2017-10-12 MED ORDER — HEPARIN (PORCINE) IN NACL 1000-0.9 UT/500ML-% IV SOLN
INTRAVENOUS | Status: AC
  Filled 2017-10-12: qty 1000

## 2017-10-12 MED ORDER — HEPARIN SODIUM (PORCINE) 1000 UNIT/ML IJ SOLN
INTRAMUSCULAR | Status: AC
  Filled 2017-10-12: qty 1

## 2017-10-12 MED ORDER — LIDOCAINE HCL (PF) 1 % IJ SOLN
INTRAMUSCULAR | Status: AC
  Filled 2017-10-12: qty 30

## 2017-10-12 MED ORDER — SACUBITRIL-VALSARTAN 49-51 MG PO TABS
1.0000 | ORAL_TABLET | Freq: Two times a day (BID) | ORAL | Status: DC
  Administered 2017-10-13: 1 via ORAL
  Filled 2017-10-12: qty 1

## 2017-10-12 MED ORDER — VERAPAMIL HCL 2.5 MG/ML IV SOLN
INTRAVENOUS | Status: AC
  Filled 2017-10-12: qty 2

## 2017-10-12 MED ORDER — HEPARIN (PORCINE) IN NACL 1000-0.9 UT/500ML-% IV SOLN
INTRAVENOUS | Status: DC | PRN
  Administered 2017-10-12: 500 mL

## 2017-10-12 MED ORDER — FENTANYL CITRATE (PF) 100 MCG/2ML IJ SOLN
INTRAMUSCULAR | Status: DC | PRN
  Administered 2017-10-12 (×2): 25 ug via INTRAVENOUS

## 2017-10-12 MED ORDER — ISOSORBIDE MONONITRATE ER 30 MG PO TB24
30.0000 mg | ORAL_TABLET | Freq: Every day | ORAL | Status: DC
  Administered 2017-10-12: 12:00:00 30 mg via ORAL

## 2017-10-12 MED ORDER — IOPAMIDOL (ISOVUE-370) INJECTION 76%
INTRAVENOUS | Status: AC
  Filled 2017-10-12: qty 100

## 2017-10-12 MED ORDER — SODIUM CHLORIDE 0.9% FLUSH: 3.0000 mL | Freq: Two times a day (BID) | INTRAVENOUS | Status: DC

## 2017-10-12 MED ORDER — MIDAZOLAM HCL 2 MG/2ML IJ SOLN
INTRAMUSCULAR | Status: DC | PRN
  Administered 2017-10-12 (×2): 1 mg via INTRAVENOUS

## 2017-10-12 MED ORDER — SACUBITRIL-VALSARTAN 24-26 MG PO TABS: 1.0000 | ORAL_TABLET | Freq: Two times a day (BID) | ORAL | Status: DC

## 2017-10-12 MED ORDER — SODIUM CHLORIDE 0.9% FLUSH: 3.0000 mL | INTRAVENOUS | Status: DC | PRN

## 2017-10-12 MED ORDER — HEPARIN SODIUM (PORCINE) 1000 UNIT/ML IJ SOLN
INTRAMUSCULAR | Status: DC | PRN
  Administered 2017-10-12: 5500 [IU] via INTRAVENOUS

## 2017-10-12 MED ORDER — SODIUM CHLORIDE 0.9 % IV SOLN: 250.0000 mL | INTRAVENOUS | Status: DC | PRN

## 2017-10-12 MED ORDER — ISOSORBIDE MONONITRATE ER 60 MG PO TB24
60.0000 mg | ORAL_TABLET | Freq: Every day | ORAL | Status: DC
  Administered 2017-10-12: 13:00:00 30 mg via ORAL
  Administered 2017-10-13: 09:00:00 60 mg via ORAL
  Filled 2017-10-12 (×2): qty 1

## 2017-10-12 MED ORDER — FENTANYL CITRATE (PF) 100 MCG/2ML IJ SOLN
INTRAMUSCULAR | Status: AC
  Filled 2017-10-12: qty 2

## 2017-10-12 MED ORDER — MIDAZOLAM HCL 2 MG/2ML IJ SOLN
INTRAMUSCULAR | Status: AC
  Filled 2017-10-12: qty 2

## 2017-10-12 MED ORDER — ISOSORBIDE MONONITRATE ER 60 MG PO TB24
60.0000 mg | ORAL_TABLET | Freq: Every day | ORAL | Status: DC
  Filled 2017-10-12: qty 1

## 2017-10-12 MED ORDER — SODIUM CHLORIDE 0.9 % IV SOLN: INTRAVENOUS | Status: DC

## 2017-10-12 SURGICAL SUPPLY — 10 items
CATH 5FR JL3.5 JR4 ANG PIG MP (CATHETERS) ×1 IMPLANT
DEVICE RAD COMP TR BAND LRG (VASCULAR PRODUCTS) ×1 IMPLANT
GLIDESHEATH SLEND SS 6F .021 (SHEATH) ×1 IMPLANT
GUIDEWIRE INQWIRE 1.5J.035X260 (WIRE) IMPLANT
INQWIRE 1.5J .035X260CM (WIRE) ×2
KIT HEART LEFT (KITS) ×2 IMPLANT
PACK CARDIAC CATHETERIZATION (CUSTOM PROCEDURE TRAY) ×2 IMPLANT
TRANSDUCER W/STOPCOCK (MISCELLANEOUS) ×2 IMPLANT
TUBING CIL FLEX 10 FLL-RA (TUBING) ×2 IMPLANT
WIRE HI TORQ VERSACORE-J 145CM (WIRE) ×1 IMPLANT

## 2017-10-12 NOTE — Progress Notes (Signed)
Progress Note  Patient Name: Stephanie Manning Date of Encounter: 10/12/2017  Primary Cardiologist: Peter [name], MD  CHF: Dr. [name]  Subjective   Denies any discomfort. Just had cath, still recovering from sedation. No pain.   Inpatient Medications    Scheduled Meds: . aspirin EC  81 mg Oral Daily  . atorvastatin  80 mg Oral q1800  . carvedilol  6.25 mg Oral BID WC  . gabapentin  600 mg Oral QHS  . insulin aspart  0-15 Units Subcutaneous TID WC  . isosorbide mononitrate  30 mg Oral Daily  . pantoprazole  40 mg Oral BID  . sodium chloride flush  3 mL Intravenous Q12H  . ticagrelor  90 mg Oral BID   Continuous Infusions: . sodium chloride 50 mL/hr at 10/11/17 0600  . sodium chloride    . sodium chloride     PRN Meds: sodium chloride, acetaminophen, nitroGLYCERIN, ondansetron (ZOFRAN) IV, sodium chloride flush, tiZANidine   Vital Signs    Vitals:   10/12/17 0546 10/12/17 0735 10/12/17 0814 10/12/17 0959  BP: (!) 158/79 (!) 145/81    Pulse: 78 74    Resp: 18 11    Temp: 98.2 F (36.8 C) 98.2 F (36.8 C)    TempSrc: Oral     SpO2: 98% 100%  100%  Weight:   237 lb 9.6 oz (107.8 kg)   Height:        Intake/Output Summary (Last 24 hours) at 10/12/2017 1103 Last data filed at 10/12/2017 1000 Gross per 24 hour  Intake 2215.53 ml  Output 3200 ml  Net -984.47 ml   Filed Weights   10/10/17 1937 10/11/17 0600 10/12/17 0814  Weight: 242 lb 1 oz (109.8 kg) 242 lb 1 oz (109.8 kg) 237 lb 9.6 oz (107.8 kg)    Telemetry    NSR without significant ventricular ectopy - Personally Reviewed  ECG    NSR with diffuse TWI in inferolateral leads - Personally Reviewed  Physical Exam   GEN: No acute distress. Still drowsy from sedation  Neck: No JVD Cardiac: RRR, no murmurs, rubs, or gallops.  R wrist TR band in place Respiratory: Clear to auscultation bilaterally. GI: Soft, nontender, non-distended  MS: No edema; No deformity. Neuro:  drowsy Psych: unable to  assess due to residual sedation   Labs    Chemistry Recent Labs  Lab 10/11/17 0840 10/12/17 0229  NA 142 141  K 4.7 4.4  CL 111 111  CO2 23 24  GLUCOSE 154* 201*  BUN 27* 24*  CREATININE 1.79* 1.73*  CALCIUM 9.2 9.0  GFRNONAA 30* 31*  GFRAA 35* 36*  ANIONGAP 8 6     Hematology Recent Labs  Lab 10/11/17 0840 10/12/17 0229  WBC 7.7 7.3  RBC 3.67* 3.53*  HGB 10.3* 9.8*  HCT 33.6* 32.1*  MCV 91.6 90.9  MCH 28.1 27.8  MCHC 30.7 30.5  RDW 14.6 14.2  PLT 181 175    Cardiac Enzymes Recent Labs  Lab 10/10/17 2035 10/11/17 0227 10/11/17 0840  TROPONINI <0.03 <0.03 <0.03   No results for input(s): TROPIPOC in the last 168 hours.   BNPNo results for input(s): BNP, PROBNP in the last 168 hours.   DDimer No results for input(s): DDIMER in the last 168 hours.   Radiology    Nm Myocar Multi W/spect W/wall Motion / Ef  Result Date: 10/11/2017  There was no ST segment deviation noted during stress.  T wave inversion was noted during stress in  the I, II, aVL, aVF, V3, V4, V5 and V6 leads. No change from baseline inversion during stress  Defect 1: There is a large defect of severe severity.  Findings consistent with prior myocardial infarction with peri-infarct ischemia.  Nuclear stress EF: 38%.  Large size, severe severity mostly fixed anterior, apical and inferior wall defect suggestive of multivessel territory scar with minimal peri-infarct ischemia (SDS 5). LVEF 38% with mid to distal anterior, apical and inferior wall akinesis. This is a high risk study.    Cardiac Studies   Cath 10/12/2017  Ost LM lesion is 40% stenosed.  Prox Cx to Dist Cx lesion is 75% stenosed.  Ost 1st Mrg lesion is 60% stenosed.  Previously placed Prox LAD to Mid LAD stent (unknown type) is widely patent.  Mid LAD lesion is 100% stenosed.  Ost 2nd Diag to 2nd Diag lesion is 70% stenosed.  Prox RCA lesion is 30% stenosed.  Mid RCA lesion is 40% stenosed.  Post Atrio lesion is  95% stenosed.   1.  Continued patency of the stented segment in the proximal LAD and stented segment in the proximal RCA 2.  Progressive small vessel CAD involving the distal left circumflex and right posterolateral branch 3.  Chronic occlusion of the mid LAD beyond the second diagonal branch unchanged in appearance from the previous study 4.  Normal LVEDP  Recommendation: Ongoing aggressive medical therapy.  The patient's probable culprit lesion is a severe stenosis in the distal posterior lateral branch involving a small territory and small caliber vessel that I think would be best treated with medical therapy rather than intervention.  Recommend dual antiplatelet therapy with Aspirin 81mg  daily and Ticagrelor 90mg  twice daily long-term (beyond 12 months) because of diffuse CAD/diabetes/multivessel stenting.  Patient Profile     57 y.o. female with PMH of CAD, ICM and CKD III presented with unstable angina. Myoview was high risk, patient eventually underwent cath on 10/12/2017. Significant post atrio lesion, medical therapy recommended.   Assessment & Plan    1. Unstable angina  - high risk myoview. Underwent cath 10/12/2017 which showed 75% prox LCx, 60% OM1, 40% LM, 100% mid LAD chronic occlusion, 95% post atrio lesion. Likely culprit is the post atrio lesion, however best managed medically given the small caliber.   - continue ASA and Brilinta for long term (beyond 12 month)  - increase Imdur to 60mg  daily  2. CAD: h/o DES to mid LAD and RCA  3. Acute on chronic renal insufficiency: check renal function tomorrow, if stable, plan for discharge.   4. Ischemic cardiomyopathy: EF 30-35%  - continue coreg, restart entresto tomorrow.       For questions or updates, please contact [place] Please consult [email or web address] for contact info under Cardiology/STEMI.      Hilbert Corrigan, PA  10/12/2017, 11:03 AM

## 2017-10-12 NOTE — Progress Notes (Signed)
Off floor to cath lab at this time.

## 2017-10-12 NOTE — Progress Notes (Signed)
[facility] BAND REMOVAL  LOCATION:    right radial  DEFLATED PER PROTOCOL:    Yes.    TIME BAND OFF / DRESSING APPLIED:    1410   SITE UPON ARRIVAL:    Level 0  SITE AFTER BAND REMOVAL:    Level 0  CIRCULATION SENSATION AND MOVEMENT:    Within Normal Limits   Yes.    COMMENTS:   TR Band removed and dressing applied C/D/I. Site bled on second deflation, air re-applied and no further bleeding noted. At this time, no bleeding, no hematoma noted. Post removal instructions provided, teach back effective. Call bell is in reach and bed is in lowest position. Will continue to monitor.

## 2017-10-12 NOTE — Progress Notes (Signed)
[place] for Heparin Indication: chest pain/ACS  Allergies  Allergen Reactions  . Amoxicillin Other (See Comments)    Yeast infection during treatment  . Hydrocodone Palpitations, Rash and Other (See Comments)    GI upset, also  . Lisinopril Palpitations and Other (See Comments)    Chest pain, too  . Meloxicam Nausea Only  . Strawberry Extract Hives    Does not always happen  . Tape Rash    Some tapes cause rashes  . Toradol [Ketorolac Tromethamine] Palpitations    Patient Measurements: Height: 5\' 8"  (172.7 cm) Weight: 242 lb 1 oz (109.8 kg) IBW/kg (Calculated) : 63.9 Heparin Dosing Weight: 88.9 kg  Vital Signs: Temp: 98.2 F (36.8 C) (07/30 1633) Temp Source: Oral (07/30 2009) BP: 160/61 (07/30 2009) Pulse Rate: 65 (07/30 2009)  Labs: Recent Labs    10/10/17 2035  10/11/17 0227 10/11/17 0840 10/11/17 1635 10/12/17 0229  HGB  --   --   --  10.3*  --  9.8*  HCT  --   --   --  33.6*  --  32.1*  PLT  --   --   --  181  --  175  HEPARINUNFRC  --    < > 1.05* 0.97* 0.48 0.20*  CREATININE  --   --   --  1.79*  --   --   TROPONINI <0.03  --  <0.03 <0.03  --   --    < > = values in this interval not displayed.    Estimated Creatinine Clearance: 44.5 mL/min (A) (by C-G formula based on SCr of 1.79 mg/dL (H)).   Medical History: Past Medical History:  Diagnosis Date  . AKI (acute kidney injury) ([place]) 02/2017  . CAD in native artery    a. NSTEMI 02/2017 s/p DES to mLAD, diffuse disease otherwise, EF 30-35%. b. Staged cath 03/2017 - patent stent in the proximal to mid LAD. CTO of the distal LAD, 90% diffuse small first diagonal, 90% distal LCx/OM, 80% diffuse prox RCA, moderately elevated LVEDP 71mmHg, s/p DES to mRCA, residual disease treated medically (too small for PCI), EF 30-35%.  . Chronic combined systolic and diastolic CHF (congestive heart failure) ([place])   . CKD (chronic kidney disease), stage IV ([place])    [name] 04/21/2017   . Diabetes mellitus, type II, insulin dependent ([place])   . GERD (gastroesophageal reflux disease)   . History of blood transfusion    "related to menses"  . History of stomach ulcers 2017  . Hyperlipidemia   . Hypertension   . Iron deficiency anemia 03/2017   "had to have iron infusions" (10/10/2017)  . Ischemic cardiomyopathy   . STEMI (ST elevation myocardial infarction) ([place]) 02/2017    Assessment: 57 y.o. female was transferred here for unstable angina. Pharmacy consulted to start heparin. Plans noted for nuclear stress test.  7/31 AM update: heparin level low this AM, abnormal stress test 7/30  Goal of Therapy:  Heparin level 0.3-0.7 units/ml Monitor platelets by anticoagulation protocol: Yes   Plan:  -Inc heparin to 750 units/hr -Perry Hall, PharmD, BCPS Clinical Pharmacist Phone: [phone]

## 2017-10-12 NOTE — Interval H&P Note (Signed)
Cath Lab Visit (complete for each Cath Lab visit)  Clinical Evaluation Leading to the Procedure:   ACS: No.  Non-ACS:    Anginal Classification: CCS II  Anti-ischemic medical therapy: Minimal Therapy (1 class of medications)  Non-Invasive Test Results: High-risk stress test findings: cardiac mortality >3%/year  Prior CABG: No previous CABG  Pt with high-risk nuclear study with multi-territory scar and peri infarct ischemia. Prior stenting of the LAD and RCA with diffuse small vessel disease.   History and Physical Interval Note:  10/12/2017 9:51 AM  Stephanie Manning  has presented today for surgery, with the diagnosis of as  The various methods of treatment have been discussed with the patient and family. After consideration of risks, benefits and other options for treatment, the patient has consented to  Procedure(s): LEFT HEART CATH AND CORONARY ANGIOGRAPHY (N/A) as a surgical intervention .  The patient's history has been reviewed, patient examined, no change in status, stable for surgery.  I have reviewed the patient's chart and labs.  Questions were answered to the patient's satisfaction.     Sherren Mocha

## 2017-10-13 LAB — GLUCOSE, CAPILLARY: Glucose-Capillary: 213 mg/dL — ABNORMAL HIGH (ref 70–99)

## 2017-10-13 MED ORDER — ISOSORBIDE MONONITRATE ER 60 MG PO TB24: 60.0000 mg | ORAL_TABLET | Freq: Every day | ORAL | 4 refills | Status: DC

## 2017-10-13 MED ORDER — SACUBITRIL-VALSARTAN 24-26 MG PO TABS
1.0000 | ORAL_TABLET | Freq: Once | ORAL | Status: AC
  Administered 2017-10-13: 13:00:00 1 via ORAL
  Filled 2017-10-13: qty 1

## 2017-10-13 MED ORDER — SACUBITRIL-VALSARTAN 24-26 MG PO TABS: 1.0000 | ORAL_TABLET | Freq: Two times a day (BID) | ORAL | 3 refills | Status: DC

## 2017-10-13 MED ORDER — WHITE PETROLATUM EX OINT
TOPICAL_OINTMENT | CUTANEOUS | Status: AC
  Administered 2017-10-13: 13:00:00
  Filled 2017-10-13: qty 28.35

## 2017-10-13 MED ORDER — CARVEDILOL 6.25 MG PO TABS: 6.2500 mg | ORAL_TABLET | Freq: Two times a day (BID) | ORAL | 4 refills | Status: DC

## 2017-10-13 MED ORDER — SACUBITRIL-VALSARTAN 24-26 MG PO TABS: 3.0000 | ORAL_TABLET | Freq: Two times a day (BID) | ORAL | Status: DC

## 2017-10-13 NOTE — Progress Notes (Signed)
Progress Note  Patient Name: Stephanie Manning Date of Encounter: 10/13/2017  Primary Cardiologist: Peter [name], MD  Heart failure: Dr. [name]  Subjective   Feeling well. No recurrent chest pain.   Inpatient Medications    Scheduled Meds: . aspirin EC  81 mg Oral Daily  . atorvastatin  80 mg Oral q1800  . carvedilol  6.25 mg Oral BID WC  . gabapentin  600 mg Oral QHS  . insulin aspart  0-15 Units Subcutaneous TID WC  . isosorbide mononitrate  60 mg Oral Daily  . pantoprazole  40 mg Oral BID  . sacubitril-valsartan  1 tablet Oral BID  . sodium chloride flush  3 mL Intravenous Q12H  . ticagrelor  90 mg Oral BID   Continuous Infusions: . sodium chloride     PRN Meds: sodium chloride, acetaminophen, nitroGLYCERIN, ondansetron (ZOFRAN) IV, sodium chloride flush, tiZANidine   Vital Signs    Vitals:   10/12/17 1956 10/12/17 2000 10/13/17 0650 10/13/17 0817  BP: (!) 143/60 (!) 143/60 (!) 144/61 (!) 149/75  Pulse: 74  67 72  Resp: 19 16 10    Temp: 98.2 F (36.8 C)  98.3 F (36.8 C) 98.2 F (36.8 C)  TempSrc: Oral  Oral Oral  SpO2: 99%  100% 100%  Weight:   235 lb 14.3 oz (107 kg)   Height:        Intake/Output Summary (Last 24 hours) at 10/13/2017 1025 Last data filed at 10/13/2017 0817 Gross per 24 hour  Intake 1741.48 ml  Output 2100 ml  Net -358.52 ml   Filed Weights   10/11/17 0600 10/12/17 0814 10/13/17 0650  Weight: 242 lb 1 oz (109.8 kg) 237 lb 9.6 oz (107.8 kg) 235 lb 14.3 oz (107 kg)    Telemetry    Sinus rhythm. Mobitz I. - Personally Reviewed  ECG    Sinus rhythm.  Rate 61 bpm.  Inferolateral TWI - Personally Reviewed  Physical Exam   VS:  BP (!) 149/75   Pulse 72   Temp 98.2 F (36.8 C) (Oral)   Resp 10   Ht 5\' 8"  (1.727 m)   Wt 235 lb 14.3 oz (107 kg)   SpO2 100%   BMI 35.87 kg/m  , BMI Body mass index is 35.87 kg/m. GENERAL:  Well appearing.  No acute distress.  HEENT: Pupils equal round and reactive, fundi not visualized, oral  mucosa unremarkable NECK:  No jugular venous distention, waveform within normal limits, carotid upstroke brisk and symmetric, no bruits, no thyromegaly LYMPHATICS:  No cervical adenopathy LUNGS:  Clear to auscultation bilaterally HEART:  RRR.  PMI not displaced or sustained,S1 and S2 within normal limits, no S3, no S4, no clicks, no rubs, no murmurs ABD:  Flat, positive bowel sounds normal in frequency in pitch, no bruits, no rebound, no guarding, no midline pulsatile mass, no hepatomegaly, no splenomegaly EXT:  2 plus pulses throughout, no edema, no cyanosis no clubbing.  R radial site without ecchymosis or hematoma SKIN:  No rashes no nodules NEURO:  Cranial nerves II through XII grossly intact, motor grossly intact throughout [facility]:  Cognitively intact, oriented to person place and time    Labs    Chemistry Recent Labs  Lab 10/11/17 0840 10/12/17 0229  NA 142 141  K 4.7 4.4  CL 111 111  CO2 23 24  GLUCOSE 154* 201*  BUN 27* 24*  CREATININE 1.79* 1.73*  CALCIUM 9.2 9.0  GFRNONAA 30* 31*  GFRAA 35* 36*  ANIONGAP 8 6     Hematology Recent Labs  Lab 10/11/17 0840 10/12/17 0229  WBC 7.7 7.3  RBC 3.67* 3.53*  HGB 10.3* 9.8*  HCT 33.6* 32.1*  MCV 91.6 90.9  MCH 28.1 27.8  MCHC 30.7 30.5  RDW 14.6 14.2  PLT 181 175    Cardiac Enzymes Recent Labs  Lab 10/10/17 2035 10/11/17 0227 10/11/17 0840  TROPONINI <0.03 <0.03 <0.03   No results for input(s): TROPIPOC in the last 168 hours.   BNPNo results for input(s): BNP, PROBNP in the last 168 hours.   DDimer No results for input(s): DDIMER in the last 168 hours.   Radiology    Nm Myocar Multi W/spect W/wall Motion / Ef  Result Date: 10/11/2017  There was no ST segment deviation noted during stress.  T wave inversion was noted during stress in the I, II, aVL, aVF, V3, V4, V5 and V6 leads. No change from baseline inversion during stress  Defect 1: There is a large defect of severe severity.  Findings consistent  with prior myocardial infarction with peri-infarct ischemia.  Nuclear stress EF: 38%.  Large size, severe severity mostly fixed anterior, apical and inferior wall defect suggestive of multivessel territory scar with minimal peri-infarct ischemia (SDS 5). LVEF 38% with mid to distal anterior, apical and inferior wall akinesis. This is a high risk study.    Cardiac Studies   Echo 05/2017: Study Conclusions  - Procedure narrative: Transthoracic echocardiography. Image   quality was suboptimal. The study was technically difficult.   Intravenous contrast (Definity) was administered. - Left ventricle: The cavity size was mildly dilated. Systolic   function was moderately to severely reduced. The estimated   ejection fraction was in the range of 30% to 35%. Akinesis and   scarring of the mid-apicalanteroseptal, anterior, and apical   myocardium; consistent with infarction in the distribution of the   left anterior descending coronary artery. Severe hypokinesis of   the inferolateral myocardium; consistent with ischemia in the   distribution of the left circumflex coronary artery. Features are   consistent with a pseudonormal left ventricular filling pattern,   with concomitant abnormal relaxation and increased filling   pressure (grade 2 diastolic dysfunction). Acoustic contrast   opacification revealed no evidence of thrombus. - Left atrium: The atrium was mildly dilated. - Right ventricle: The cavity size was mildly dilated.  LHC 04/01/17:  Dist LAD lesion is 100% stenosed.  Previously placed Prox LAD drug eluting stent is widely patent.  Ost 1st Diag to 1st Diag lesion is 90% stenosed.  Prox Cx to Mid Cx lesion is 50% stenosed.  Dist Cx lesion is 90% stenosed.  3rd Mrg lesion is 90% stenosed.  Mid Cx lesion is 85% stenosed.  Prox RCA to Mid RCA lesion is 80% stenosed.  A drug-eluting stent was successfully placed using a STENT SYNERGY DES [id number].  Post intervention, there  is a 0% residual stenosis.  LV end diastolic pressure is moderately elevated.   1. 3 vessel obstructive CAD   - patent stent in the proximal to mid LAD. CTO of the distal LAD   - 90% diffuse small first diagonal   - 90% distal LCx/OM   - 80% diffuse proximal RCA 2. Moderately elevated LVEDP- 28 mm Hg 3. Successful stenting of the proximal to mid RCA with DES  LHC 10/12/17:  Ost LM lesion is 40% stenosed.  Prox Cx to Dist Cx lesion is 75% stenosed.  Ost 1st Mrg lesion is 60%  stenosed.  Previously placed Prox LAD to Mid LAD stent (unknown type) is widely patent.  Mid LAD lesion is 100% stenosed.  Ost 2nd Diag to 2nd Diag lesion is 70% stenosed.  Prox RCA lesion is 30% stenosed.  Mid RCA lesion is 40% stenosed.  Post Atrio lesion is 95% stenosed.   1.  Continued patency of the stented segment in the proximal LAD and stented segment in the proximal RCA 2.  Progressive small vessel CAD involving the distal left circumflex and right posterolateral branch 3.  Chronic occlusion of the mid LAD beyond the second diagonal branch unchanged in appearance from the previous study 4.  Normal LVEDP  Recommendation: Ongoing aggressive medical therapy.  The patient's probable culprit lesion is a severe stenosis in the distal posterior lateral branch involving a small territory and small caliber vessel that I think would be best treated with medical therapy rather than intervention.  Recommend dual antiplatelet therapy with Aspirin 81mg  daily and Ticagrelor 90mg  twice daily long-term (beyond 12 months) because of diffuse CAD/diabetes/multivessel stenting.  Patient Profile     57 y.o. female with chronic systolic and diastolic heart failure, CAD s/p PCI and residual distal obstructive disease, CKD III, diabetes, hypertension, and hyperlipidemia here with chest pain.  Assessment & Plan    # Angina:  # Obstructive CAD:   Ms. Pellot had an episode of chest pain that awakened her from sleep.   It is not exertional.  Cardiac enzymes have been negative.  EKG is unchanged from prior.  Given her renal dysfunction she underwent Lexiscan Myoview which was abnormal.  She underwent LHC on 7/31 and was found to have significant stenosis of the LCx and PL arteries that were not amenable to PCI.  Her LAD stent is patent and she has known 100% distal LAD stenosis.  Her only option is medical management.  We have increased Imdur to 60mg .  Carvedilol was reduced to 6.25mg  bid due to bradycardia, Mobitz I with up two 2 dropped beats and dizziness.  Continue DAPT with aspirin and ticagrelor indefinitely.  Consider reducing ticagrelor to 60mg  in 1 year.  # Chronic systolic and diastolic heart failure: # Essential hypertension:  Increase Entresto to 73/77 mg bid given that her BP is high since reducing metoprolol.  She is euvolemic.   # Mobitz I: # Dizziness: Reduced carvedilol as above.      For questions or updates, please contact [place] Please consult [email or web address] for contact info under Cardiology/STEMI.      Signed, Skeet Latch, MD  10/13/2017, 10:25 AM

## 2017-10-13 NOTE — Discharge Summary (Signed)
Discharge Summary    Patient ID: Stephanie Manning,  MRN: [id number], DOB/AGE: [birth year] 57 y.o.  Admit date: 10/10/2017 Discharge date: 10/13/2017  Primary Care Provider: [name] Primary Cardiologist: Peter [name], MD  Discharge Diagnoses    Principal Problem:   Unstable angina [facility]) Active Problems:   Type 2 diabetes mellitus with complication, with long-term current use of insulin ([place])   Essential hypertension   Acute combined systolic and diastolic heart failure (HCC)   CKD (chronic kidney disease), stage III ([place])   Pure hypercholesterolemia   Chronic combined systolic and diastolic heart failure (HCC)  Allergies Allergies  Allergen Reactions  . Amoxicillin Other (See Comments)    Yeast infection during treatment  . Hydrocodone Palpitations, Rash and Other (See Comments)    GI upset, also  . Lisinopril Palpitations and Other (See Comments)    Chest pain, too  . Meloxicam Nausea Only  . Strawberry Extract Hives    Does not always happen  . Tape Rash    Some tapes cause rashes  . Toradol [Ketorolac Tromethamine] Palpitations    Diagnostic Studies/Procedures    LHC 10/12/17:  Ost LM lesion is 40% stenosed.  Prox Cx to Dist Cx lesion is 75% stenosed.  Ost 1st Mrg lesion is 60% stenosed.  Previously placed Prox LAD to Mid LAD stent (unknown type) is widely patent.  Mid LAD lesion is 100% stenosed.  Ost 2nd Diag to 2nd Diag lesion is 70% stenosed.  Prox RCA lesion is 30% stenosed.  Mid RCA lesion is 40% stenosed.  Post Atrio lesion is 95% stenosed.  1. Continued patency of the stented segment in the proximal LAD and stented segment in the proximal RCA 2. Progressive small vessel CAD involving the distal left circumflex and right posterolateral branch 3. Chronic occlusion of the mid LAD beyond the second diagonal branch unchanged in appearance from the previous study 4. Normal LVEDP  Recommendation: Ongoing aggressive medical  therapy. The patient's probable culprit lesion is a severe stenosis in the distal posterior lateral branch involving a small territory and small caliber vessel that I think would be best treated with medical therapy rather than intervention.  Recommend dual antiplatelet therapy with Aspirin 81mg  daily and Ticagrelor 90mg  twice dailylong-term (beyond 12 months) because of diffuse CAD/diabetes/multivessel stenting.  History of Present Illness     Stephanie Manning is a 57 year old female with a history of CAD status post DES to mid LAD and RCA, ischemic cardiomyopathy, chronic kidney disease stage III, diabetes, hypertension and hyperlipidemia transferred from [facility] to [facility] for unstable angina on 10/10/2017.  In 02/2017, she had an anterior MI with DES to totally occluded mid LAD.  She returned in 03/2017 for staged DES to 80% RCA stenosis.  Echocardiogram in 02/2017 showed LVEF of 30 to 35%.  Repeat echocardiogram 05/21/2017 showed EF stable at 30 to 35%.  She follows with Dr. [name] for heart failure.  She was started on Entresto and spironolactone.  Entresto increased to 49/51 twice daily on 09/27/2017 with plans to follow-up with Dr. Rayann Heman as an outpatient for ICD placement.  At baseline, Stephanie Manning has intermittent chest pain requiring sublingual nitroglycerin 2-3 times per week.  Her presenting episode started 1 day prior to admission while at church which resolved with one sublingual nitroglycerin.  She had a recurrent chest pain episode 10/09/2017 at approximately 11 PM which again resolved with nitro.  At 3 AM, she was woken from sleep with one additional episode.  She reported this felt like her prior MI with radiation to her back and jaw which was minimally improved with nitro.  She proceeded to [facility] for further evaluation then subsequently transferred to [facility].  Hospital Course     While in the hospital, her cardiac enzymes remained negative. Her EKG was unchanged from prior  tracings.  Her symptoms were considered atypical. Given her elevated renal function, it was recommended that she undergo a Lexiscan Myoview to assess for areas of significant ischemia.  This was performed 10/11/2017 which revealed a large size, severe severity mostly fixed anterior, apical and inferior wall defect suggestive of multivessel territory scar with minimal peri-infarct ischemia with an LVEF estimated at 38% with mid to distal anterior, apical and inferior wall akinesis considered a high risk study.   Her creatinine on admission was 1.79 with a baseline of approximately 1.5-1.6.  Given this, her Entresto and spironolactone were held secondary to acute on chronic kidney disease.  Given her abnormal Lexiscan Myoview study, patient underwent a cardiac catheterization on 10/12/2017 which revealed significant stenosis of the LCx and PL arteries that were not amenable to PCI.  Her LAD stent was patent and she has known 100% distal LAD stenosis.  Her only option was considered to be medical management.  Her Imdur was increased to 60 mg.  Her carvedilol dose was decreased to 6.25 mg twice daily due to bradycardia, Mobitz 1 with up to 2 dropped beats and dizziness. Plan is to continue DAPT with ASA and ticagrelor definitely.  As far as her chronic systolic and diastolic heart failure, her Delene Loll was increased to 73-77 mg twice daily given that her BP is high since reducing metoprolol.  She was noted to be euvolemic on exam. Her discharge weight is 235lb  With an admission weight of 242lb. Her creatinine at d/c is 1.73.   She had mild episodes of dizziness while inpatient which resolved with reduction in carvedilol.  Medication plan: Increase Imdur to 60 mg, Entresto>>the patient is to take one 24-26 tab and one 49- 51 tab twice daily for a total of 73/77 mg per dose.  Her carvedilol was decreased to 6.25 secondary to dizziness with the additional Entresto.   Consultants: None   Patient has been seen  and examined by Dr. Oval Linsey who feels that she is stable and ready for discharge on 10/13/2017.  Cardiac cath site unremarkable. _____________  Discharge Vitals Blood pressure (!) 149/75, pulse 72, temperature 98.2 F (36.8 C), temperature source Oral, resp. rate 10, height 5\' 8"  (1.727 m), weight 235 lb 14.3 oz (107 kg), SpO2 100 %.  Filed Weights   10/11/17 0600 10/12/17 0814 10/13/17 0650  Weight: 242 lb 1 oz (109.8 kg) 237 lb 9.6 oz (107.8 kg) 235 lb 14.3 oz (107 kg)   Labs & Radiologic Studies    CBC Recent Labs    10/11/17 0840 10/12/17 0229  WBC 7.7 7.3  HGB 10.3* 9.8*  HCT 33.6* 32.1*  MCV 91.6 90.9  PLT 181 400   Basic Metabolic Panel Recent Labs    10/11/17 0840 10/12/17 0229  NA 142 141  K 4.7 4.4  CL 111 111  CO2 23 24  GLUCOSE 154* 201*  BUN 27* 24*  CREATININE 1.79* 1.73*  CALCIUM 9.2 9.0   Cardiac Enzymes Recent Labs    10/10/17 2035 10/11/17 0227 10/11/17 0840  TROPONINI <0.03 <0.03 <0.03   __________  Nm Myocar Multi W/spect W/wall Motion / Ef  Result  Date: 10/11/2017  There was no ST segment deviation noted during stress.  T wave inversion was noted during stress in the I, II, aVL, aVF, V3, V4, V5 and V6 leads. No change from baseline inversion during stress  Defect 1: There is a large defect of severe severity.  Findings consistent with prior myocardial infarction with peri-infarct ischemia.  Nuclear stress EF: 38%.  Large size, severe severity mostly fixed anterior, apical and inferior wall defect suggestive of multivessel territory scar with minimal peri-infarct ischemia (SDS 5). LVEF 38% with mid to distal anterior, apical and inferior wall akinesis. This is a high risk study.   Disposition   Pt is being discharged home today in good condition.  Follow-up Plans & Appointments    Follow-up Information    Almyra Deforest, [place] Follow up on 10/19/2017.   Specialties:  Cardiology, Radiology Why:  Your follow-up appointment will be on 10/19/2017 at  3 PM.  Please arrive to your appointment by 2:45 PM.  Contact information: [address] [place] [address] [phone]          Discharge Instructions    ([place]) Call MD:  Anytime you have any of the following symptoms: 1) 3 pound weight gain in 24 hours or 5 pounds in 1 week 2) shortness of breath, with or without a dry hacking cough 3) swelling in the hands, feet or stomach 4) if you have to sleep on extra pillows at night in order to breathe.   Complete by:  As directed    Call MD for:  difficulty breathing, headache or visual disturbances   Complete by:  As directed    Call MD for:  persistant dizziness or light-headedness   Complete by:  As directed    Call MD for:  persistant nausea and vomiting   Complete by:  As directed    Call MD for:  redness, tenderness, or signs of infection (pain, swelling, redness, odor or green/yellow discharge around incision site)   Complete by:  As directed    Call MD for:  severe uncontrolled pain   Complete by:  As directed    Call MD for:  temperature >100.4   Complete by:  As directed    Diet - low sodium heart healthy   Complete by:  As directed    Discharge instructions   Complete by:  As directed    You will need to take one 49/51 Entresto tablet twice daily as well as one 24/26 Entresto tablet twice daily for a total of 73/77 mg twice daily.  No driving for 3 days. No lifting over 5 lbs for 1 week. No sexual activity for 1 week. Keep procedure site clean & dry. If you notice increased pain, swelling, bleeding or pus, call/return!  You may shower, but no soaking baths/hot tubs/pools for 1 week.   PLEASE DO NOT MISS ANY DOSES OF YOUR BRILINTA!!!!! Also keep a log of you blood pressures and bring back to your follow up appt. Please call the office with any questions.   Patients taking blood thinners should generally stay away from medicines like ibuprofen, Advil, Motrin, naproxen, and Aleve due to risk of  stomach bleeding. You may take Tylenol as directed or talk to your primary doctor about alternatives.  If you notice any bleeding such as blood in stool, black tarry stools, blood in urine, nosebleeds or any other unusual bleeding, call your doctor immediately.   Increase activity slowly   Complete by:  As directed      Discharge Medications   Allergies as of 10/13/2017      Reactions   Amoxicillin Other (See Comments)   Yeast infection during treatment   Hydrocodone Palpitations, Rash, Other (See Comments)   GI upset, also   Lisinopril Palpitations, Other (See Comments)   Chest pain, too   Meloxicam Nausea Only   Strawberry Extract Hives   Does not always happen   Tape Rash   Some tapes cause rashes   Toradol [ketorolac Tromethamine] Palpitations      Medication List    TAKE these medications   aspirin 81 MG chewable tablet Chew 1 tablet (81 mg total) by mouth daily.   atorvastatin 80 MG tablet Commonly known as:  LIPITOR Take 1 tablet (80 mg total) by mouth daily at 6 PM.   carvedilol 6.25 MG tablet Commonly known as:  COREG Take 1 tablet (6.25 mg total) by mouth 2 (two) times daily with a meal. What changed:    how much to take  when to take this   furosemide 20 MG tablet Commonly known as:  LASIX Take 1 tablet (20 mg total) by mouth every other day.   gabapentin 300 MG capsule Commonly known as:  NEURONTIN Take 600 mg by mouth at bedtime.   insulin detemir 100 UNIT/ML injection Commonly known as:  LEVEMIR Inject 0.25 mLs (25 Units total) into the skin 2 (two) times daily. Patient uses sliding scale at home per primary care provider. What changed:    how much to take  when to take this  additional instructions   isosorbide mononitrate 60 MG 24 hr tablet Commonly known as:  IMDUR Take 1 tablet (60 mg total) by mouth daily. Start taking on:  10/14/2017 What changed:    medication strength  how much to take   multivitamin with minerals Tabs  tablet Take 1 tablet by mouth daily.   nitroGLYCERIN 0.4 MG SL tablet Commonly known as:  NITROSTAT Place 1 tablet (0.4 mg total) under the tongue every 5 (five) minutes as needed for chest pain.   NOVOLOG FLEXPEN 100 UNIT/ML FlexPen Generic drug:  insulin aspart Inject 2-10 Units into the skin See admin instructions. Inject 2-10 units into the skin three times a day before meals as needed for elevated BGL, per sliding scale   pantoprazole 40 MG tablet Commonly known as:  PROTONIX Take 1 tablet (40 mg total) by mouth 2 (two) times daily.   potassium chloride SA 20 MEQ tablet Commonly known as:  K-DUR,KLOR-CON Take 1 tablet (20 mEq total) by mouth daily.   sacubitril-valsartan 49-51 MG Commonly known as:  ENTRESTO Take 1 tablet by mouth 2 (two) times daily. What changed:  Another medication with the same name was added. Make sure you understand how and when to take each.   sacubitril-valsartan 24-26 MG Commonly known as:  ENTRESTO Take 1 tablet by mouth 2 (two) times daily. What changed:  You were already taking a medication with the same name, and this prescription was added. Make sure you understand how and when to take each.   Saline 0.2 % Soln Place 1 spray into both nostrils as needed (congestion).   spironolactone 25 MG tablet Commonly known as:  ALDACTONE Take 0.5 tablets (12.5 mg total) by mouth at bedtime.   ticagrelor 90 MG Tabs tablet Commonly known as:  BRILINTA Take 1 tablet (90 mg total) by mouth 2 (two) times daily.   tiZANidine 4 MG tablet Commonly known as:  ZANAFLEX Take 4 mg by mouth at bedtime.        Acute coronary syndrome (MI, NSTEMI, STEMI, etc) this admission?: No.    Outstanding Labs/Studies   Will need BMET at follow up appointment    Duration of Discharge Encounter   Greater than 30 minutes including physician time.  Signed, Kathyrn Drown, NP 10/13/2017, 12:02 PM

## 2017-10-14 ENCOUNTER — Encounter (HOSPITAL_COMMUNITY): Payer: Self-pay | Admitting: Cardiology

## 2017-10-19 ENCOUNTER — Ambulatory Visit (INDEPENDENT_AMBULATORY_CARE_PROVIDER_SITE_OTHER): Payer: Medicare Other | Admitting: Physician Assistant

## 2017-10-19 ENCOUNTER — Encounter: Payer: Self-pay | Admitting: Physician Assistant

## 2017-10-19 VITALS — BP 126/60 | HR 80 | Ht 68.0 in | Wt 240.0 lb

## 2017-10-19 DIAGNOSIS — I10 Essential (primary) hypertension: Secondary | ICD-10-CM

## 2017-10-19 DIAGNOSIS — E785 Hyperlipidemia, unspecified: Secondary | ICD-10-CM

## 2017-10-19 DIAGNOSIS — I2510 Atherosclerotic heart disease of native coronary artery without angina pectoris: Secondary | ICD-10-CM | POA: Diagnosis not present

## 2017-10-19 DIAGNOSIS — N184 Chronic kidney disease, stage 4 (severe): Secondary | ICD-10-CM

## 2017-10-19 DIAGNOSIS — I255 Ischemic cardiomyopathy: Secondary | ICD-10-CM

## 2017-10-19 DIAGNOSIS — E119 Type 2 diabetes mellitus without complications: Secondary | ICD-10-CM

## 2017-10-19 DIAGNOSIS — I5042 Chronic combined systolic (congestive) and diastolic (congestive) heart failure: Secondary | ICD-10-CM

## 2017-10-19 DIAGNOSIS — Z794 Long term (current) use of insulin: Secondary | ICD-10-CM

## 2017-10-19 NOTE — Progress Notes (Signed)
Cardiology Office Note    Date:  10/19/2017   ID:  Cindy, Fullman [birth year], MRN [id number]  PCP:  [name], MD  Cardiologist: Dr. [name] Heart failure clinic: Dr. [name]  Chief Complaint  Patient presents with  . Cath follow-up    pt c/o frequent dizziness from [facility]; palpitations--states she feels hard, fast beats when taking the entresto    History of Present Illness:  Leona Alen is a 57 y.o. female with PMH of CAD, DM II, CKD stage IV, HTN, HLD, CAD and ICM.  In December 2018 she had anterior MI with DES to totally occluded mid LAD.  She returned in January 2019 for staged DES to 80% RCA disease.  Echocardiogram in December 2018 showed ejection fraction of 30 to 35%.  Repeat echocardiogram in March 2019 continue to show ejection fraction 30 to 35%.  Despite intervention, she still has intermittent chest discomfort.  She was referred to Dr. [name] for management of heart failure.  She was last seen in the heart failure clinic on 09/27/2017, she was on carvedilol, Entresto was increased to 49-51 mg twice daily, Lasix reduced to 20 mg every other day and she is also on spironolactone 12.5 mg daily.  Given her persistent low ejection fraction, she was referred to electrophysiology service for consideration of ICD.  She most recently presented to the hospital on 10/10/2017 with recurrent chest pain.  She underwent Myoview which came back high risk.  She eventually underwent cardiac catheterization on 10/12/2017 which showed 75% prox LCx, 60% OM1, 40% LM, 100% mid LAD chronic occlusion, 95% post atrio lesion.  The culprit lesion was felt to be post atrial lesion, however best management was medical therapy given the small caliber of the artery.  She was recommended to continue aspirin and Brilinta for long-term, beyond 63-month.  I increased her Imdur to 60 mg daily.  During this admission, carvedilol was reduced to 6.25 mg twice daily due to bradycardia, Mobitz type I  and up to 2 dropped beats and dizziness.  Her Delene Loll was increased to 73-77 mg twice daily for high blood pressure.  Patient presents today for cardiology office visit.  She has been having significant dizziness since increasing Entresto.  She tried to space out the 2 different doses of Entresto at a different time, this does help her symptom however she still cannot function very well.  She says she took 24-26 mg Entresto this morning, however decided not to take the 49-51 mg.  Otherwise she has not had any significant chest discomfort.  Whenever she is dizzy she does feel like her chest is pounding.  On physical exam, she appears to be euvolemic.  She does not have any orthopnea or PND.  I think the 73-77 mg twice daily dosing of Entresto was dropping her blood pressure too much.  I have removed the 24-26 mg Entresto and asked her to take only the 49-51 mg twice daily dosing of Entresto from this point forward.  She has another appointment with Dr. Rayann Heman and Dr. [name] within the next month.  After the recent hospitalization, although she did have a low-grade headache associated with higher dose of Imdur, this has not interfered with her lifestyle.   Past Medical History:  Diagnosis Date  . AKI (acute kidney injury) ([place]) 02/2017  . CAD in native artery    a. NSTEMI 02/2017 s/p DES to mLAD, diffuse disease otherwise, EF 30-35%. b. Staged cath 03/2017 - patent stent in  the proximal to mid LAD. CTO of the distal LAD, 90% diffuse small first diagonal, 90% distal LCx/OM, 80% diffuse prox RCA, moderately elevated LVEDP 67mmHg, s/p DES to mRCA, residual disease treated medically (too small for PCI), EF 30-35%.  . Chronic combined systolic and diastolic CHF (congestive heart failure) ([place])   . CKD (chronic kidney disease), stage IV ([place])    [name] 04/21/2017  . Diabetes mellitus, type II, insulin dependent ([place])   . GERD (gastroesophageal reflux disease)   . History of blood transfusion    "related to  menses"  . History of stomach ulcers 2017  . Hyperlipidemia   . Hypertension   . Iron deficiency anemia 03/2017   "had to have iron infusions" (10/10/2017)  . Ischemic cardiomyopathy   . STEMI (ST elevation myocardial infarction) ([place]) 02/2017    Past Surgical History:  Procedure Laterality Date  . CESAREAN SECTION  1987  . CORONARY ANGIOPLASTY WITH STENT PLACEMENT  04/01/2017   drug-eluting stent was successfully placed using a STENT SYNERGY DES 6.29B28  . CORONARY STENT INTERVENTION N/A 04/01/2017   Procedure: CORONARY STENT INTERVENTION;  Surgeon: [name], [name], MD;  Location: [place] [facility];  Service: Cardiovascular;  Laterality: N/A;  . CORONARY/GRAFT ACUTE MI REVASCULARIZATION N/A 03/10/2017   Procedure: Coronary/Graft Acute MI Revascularization;  Surgeon: [name], [name], MD;  Location: [place] [facility];  Service: Cardiovascular;  Laterality: N/A;  . LEFT HEART CATH AND CORONARY ANGIOGRAPHY N/A 03/10/2017   Procedure: LEFT HEART CATH AND CORONARY ANGIOGRAPHY;  Surgeon: [name], [name], MD;  Location: [place] [facility];  Service: Cardiovascular;  Laterality: N/A;  . LEFT HEART CATH AND CORONARY ANGIOGRAPHY N/A 04/01/2017   Procedure: LEFT HEART CATH AND CORONARY ANGIOGRAPHY;  Surgeon: [name], [name], MD;  Location: [place] [facility];  Service: Cardiovascular;  Laterality: N/A;  . LEFT HEART CATH AND CORONARY ANGIOGRAPHY N/A 10/12/2017   Procedure: LEFT HEART CATH AND CORONARY ANGIOGRAPHY;  Surgeon: Sherren Mocha, MD;  Location: [place] [facility];  Service: Cardiovascular;  Laterality: N/A;  . OOPHORECTOMY     "1 ovary removed"  . THROAT SURGERY  02/2014   "nodule in my throat removed; not related to thyroid"    Current Medications: Outpatient Medications Prior to Visit  Medication Sig Dispense Refill  . aspirin 81 MG chewable tablet Chew 1 tablet (81 mg total) by mouth daily.    Marland Kitchen atorvastatin (LIPITOR) 80 MG tablet Take 1 tablet (80 mg total) by mouth daily at 6  PM. 90 tablet 1  . carvedilol (COREG) 6.25 MG tablet Take 1 tablet (6.25 mg total) by mouth 2 (two) times daily with a meal. 180 tablet 4  . furosemide (LASIX) 20 MG tablet Take 1 tablet (20 mg total) by mouth every other day. 15 tablet 4  . gabapentin (NEURONTIN) 300 MG capsule Take 600 mg by mouth at bedtime.    . insulin aspart (NOVOLOG FLEXPEN) 100 UNIT/ML FlexPen Inject 2-10 Units into the skin See admin instructions. Inject 2-10 units into the skin three times a day before meals as needed for elevated BGL, per sliding scale    . insulin detemir (LEVEMIR) 100 UNIT/ML injection Inject 0.25 mLs (25 Units total) into the skin 2 (two) times daily. Patient uses sliding scale at home per primary care provider. (Patient taking differently: Inject 25-50 Units into the skin See admin instructions. Inject 25-50 units into the skin two times a day before meals, per sliding scale)    . isosorbide  mononitrate (IMDUR) 60 MG 24 hr tablet Take 1 tablet (60 mg total) by mouth daily. 90 tablet 4  . Multiple Vitamin (MULTIVITAMIN WITH MINERALS) TABS tablet Take 1 tablet by mouth daily.    . nitroGLYCERIN (NITROSTAT) 0.4 MG SL tablet Place 1 tablet (0.4 mg total) under the tongue every 5 (five) minutes as needed for chest pain. 25 tablet 2  . pantoprazole (PROTONIX) 40 MG tablet Take 1 tablet (40 mg total) by mouth 2 (two) times daily. 180 tablet 1  . potassium chloride SA (K-DUR,KLOR-CON) 20 MEQ tablet Take 1 tablet (20 mEq total) by mouth daily. 90 tablet 1  . sacubitril-valsartan (ENTRESTO) 49-51 MG Take 1 tablet by mouth 2 (two) times daily. 60 tablet 3  . Saline 0.2 % SOLN Place 1 spray into both nostrils as needed (congestion).     Marland Kitchen spironolactone (ALDACTONE) 25 MG tablet Take 0.5 tablets (12.5 mg total) by mouth at bedtime. 15 tablet 3  . ticagrelor (BRILINTA) 90 MG TABS tablet Take 1 tablet (90 mg total) by mouth 2 (two) times daily. 180 tablet 2  . tiZANidine (ZANAFLEX) 4 MG tablet Take 4 mg by mouth at  bedtime.     . sacubitril-valsartan (ENTRESTO) 24-26 MG Take 1 tablet by mouth 2 (two) times daily. 120 tablet 3   No facility-administered medications prior to visit.      Allergies:   Amoxicillin; Hydrocodone; Lisinopril; Meloxicam; Strawberry extract; Tape; and Toradol [ketorolac tromethamine]   Social History   Socioeconomic History  . Marital status: Widowed    Spouse name: Not on file  . Number of children: Not on file  . Years of education: Not on file  . Highest education level: Not on file  Occupational History  . Not on file  Social Needs  . Financial resource strain: Not on file  . Food insecurity:    Worry: Not on file    Inability: Not on file  . Transportation needs:    Medical: Not on file    Non-medical: Not on file  Tobacco Use  . Smoking status: Never Smoker  . Smokeless tobacco: Never Used  Substance and Sexual Activity  . Alcohol use: Never    Frequency: Never  . Drug use: Never  . Sexual activity: Yes  Lifestyle  . Physical activity:    Days per week: Not on file    Minutes per session: Not on file  . Stress: Not on file  Relationships  . Social connections:    Talks on phone: Not on file    Gets together: Not on file    Attends religious service: Not on file    Active member of club or organization: Not on file    Attends meetings of clubs or organizations: Not on file    Relationship status: Not on file  Other Topics Concern  . Not on file  Social History Narrative  . Not on file     Family History:  The patient's family history includes Diabetes in her mother; Heart disease in her mother; Hypertension in her mother; Stroke in her mother.   ROS:   Please see the history of present illness.    ROS All other systems reviewed and are negative.   PHYSICAL EXAM:   VS:  BP 126/60   Pulse 80   Ht 5\' 8"  (1.727 m)   Wt 240 lb (108.9 kg)   BMI 36.49 kg/m    GEN: Well nourished, well developed, in no acute distress  HEENT: normal  Neck:  no JVD, carotid bruits, or masses Cardiac: RRR; no murmurs, rubs, or gallops,no edema  Respiratory:  clear to auscultation bilaterally, normal work of breathing GI: soft, nontender, nondistended, + BS MS: no deformity or atrophy  Skin: warm and dry, no rash Neuro:  Alert and Oriented x 3, Strength and sensation are intact Psych: euthymic mood, full affect  Wt Readings from Last 3 Encounters:  10/19/17 240 lb (108.9 kg)  10/13/17 235 lb 14.3 oz (107 kg)  09/27/17 242 lb 1.9 oz (109.8 kg)      Studies/Labs Reviewed:   EKG:  EKG is not ordered today.    Recent Labs: 03/15/2017: ALT 22 03/30/2017: B Natriuretic Peptide 462.4 09/27/2017: Magnesium 2.0 10/12/2017: BUN 24; Creatinine, Ser 1.73; Hemoglobin 9.8; Platelets 175; Potassium 4.4; Sodium 141   Lipid Panel    Component Value Date/Time   CHOL 117 08/02/2017 1525   TRIG 61 08/02/2017 1525   HDL 40 (L) 08/02/2017 1525   CHOLHDL 2.9 08/02/2017 1525   VLDL 12 08/02/2017 1525   LDLCALC 65 08/02/2017 1525    Additional studies/ records that were reviewed today include:   Cath 10/12/2017  Ost LM lesion is 40% stenosed.  Prox Cx to Dist Cx lesion is 75% stenosed.  Ost 1st Mrg lesion is 60% stenosed.  Previously placed Prox LAD to Mid LAD stent (unknown type) is widely patent.  Mid LAD lesion is 100% stenosed.  Ost 2nd Diag to 2nd Diag lesion is 70% stenosed.  Prox RCA lesion is 30% stenosed.  Mid RCA lesion is 40% stenosed.  Post Atrio lesion is 95% stenosed.   1.  Continued patency of the stented segment in the proximal LAD and stented segment in the proximal RCA 2.  Progressive small vessel CAD involving the distal left circumflex and right posterolateral branch 3.  Chronic occlusion of the mid LAD beyond the second diagonal branch unchanged in appearance from the previous study 4.  Normal LVEDP  Recommendation: Ongoing aggressive medical therapy.  The patient's probable culprit lesion is a severe stenosis in the  distal posterior lateral branch involving a small territory and small caliber vessel that I think would be best treated with medical therapy rather than intervention.  Recommend dual antiplatelet therapy with Aspirin 81mg  daily and Ticagrelor 90mg  twice daily long-term (beyond 12 months) because of diffuse CAD/diabetes/multivessel stenting.   ASSESSMENT:    1. Chronic combined systolic and diastolic heart failure ([place])   2. Coronary artery disease involving native coronary artery of native heart without angina pectoris   3. Ischemic cardiomyopathy   4. Controlled type 2 diabetes mellitus without complication, with long-term current use of insulin ([place])   5. CKD (chronic kidney disease), stage IV ([place])   6. Essential hypertension   7. Hyperlipidemia, unspecified hyperlipidemia type      PLAN:  In order of problems listed above:  1. Chronic combined systolic and diastolic heart failure: Appears to be euvolemic on physical exam.  Carvedilol was recently reduced due to baseline bradycardia and presence of Mobitz 1 heart block during the hospitalization.  Entresto was increased from previous 49-51 mg twice daily to 73-77 mg twice daily.  However she is having significant dizziness after starting on this higher dose of Entresto, I will reduce the Entresto back to 49-51 mg twice daily instead.  2. CAD: Recently underwent cardiac catheterization, does have severe disease in the distal RCA, however this is in a very small artery and best managed medically instead.  Imdur was increased to 60 mg daily, she does complain of mild headache, however this is not interfering with her lifestyle  3. Hypertension: Blood pressure stable on today's visit, however her recent symptom is concerning for hypertension with dizziness and severe fatigue after starting on the higher dose of Entresto.  I have reduced Entresto to 49-51 mg twice daily.  4. Hyperlipidemia: On Lipitor 80 mg daily.  Cholesterol was well  controlled on recent lab work  5. DM2: On insulin, managed by primary care provider.    Medication Adjustments/Labs and Tests Ordered: Current medicines are reviewed at length with the patient today.  Concerns regarding medicines are outlined above.  Medication changes, Labs and Tests ordered today are listed in the Patient Instructions below. Patient Instructions  Medication Instructions:  DISCONTINUE Entresto 24/26MG  ONLY TAKE 49/51MG  OF ENTRESTO 1 TABLET TWICE A DAY  Labwork: Your physician recommends that you return for lab work in: TODAY-BMET  Testing/Procedures: NONE  Follow-Up: FOLLOW UP AS SCHEUDLED  Any Other Special Instructions Will Be Listed Below (If Applicable). If you need a refill on your cardiac medications before your next appointment, please call your pharmacy.     Hilbert Corrigan, [place]  10/19/2017 5:50 PM    [facility] [address], [place], [place]  [address] Phone: [phone]; Fax: [phone]

## 2017-10-19 NOTE — Patient Instructions (Addendum)
Medication Instructions:  DISCONTINUE Entresto 24/26MG  ONLY TAKE 49/51MG  OF ENTRESTO 1 TABLET TWICE A DAY  Labwork: Your physician recommends that you return for lab work in: TODAY-BMET  Testing/Procedures: NONE  Follow-Up: FOLLOW UP AS SCHEUDLED  Any Other Special Instructions Will Be Listed Below (If Applicable). If you need a refill on your cardiac medications before your next appointment, please call your pharmacy.

## 2017-10-20 LAB — BASIC METABOLIC PANEL
BUN / CREAT RATIO: 15 (ref 9–23)
BUN: 26 mg/dL — ABNORMAL HIGH (ref 6–24)
CO2: 20 mmol/L (ref 20–29)
CREATININE: 1.78 mg/dL — AB (ref 0.57–1.00)
Calcium: 9.3 mg/dL (ref 8.7–10.2)
Chloride: 107 mmol/L — ABNORMAL HIGH (ref 96–106)
GFR calc Af Amer: 36 mL/min/{1.73_m2} — ABNORMAL LOW (ref >59)
GFR, EST NON AFRICAN AMERICAN: 31 mL/min/{1.73_m2} — AB (ref >59)
GLUCOSE: 184 mg/dL — AB (ref 65–99)
Potassium: 5 mmol/L (ref 3.5–5.2)
SODIUM: 142 mmol/L (ref 134–144)

## 2017-10-31 ENCOUNTER — Encounter: Payer: Self-pay | Admitting: Internal Medicine

## 2017-10-31 ENCOUNTER — Ambulatory Visit (INDEPENDENT_AMBULATORY_CARE_PROVIDER_SITE_OTHER): Payer: Medicare Other | Admitting: Internal Medicine

## 2017-10-31 VITALS — BP 112/66 | HR 79 | Ht 68.0 in | Wt 239.8 lb

## 2017-10-31 DIAGNOSIS — I2510 Atherosclerotic heart disease of native coronary artery without angina pectoris: Secondary | ICD-10-CM

## 2017-10-31 DIAGNOSIS — I5022 Chronic systolic (congestive) heart failure: Secondary | ICD-10-CM

## 2017-10-31 DIAGNOSIS — I255 Ischemic cardiomyopathy: Secondary | ICD-10-CM

## 2017-10-31 NOTE — Patient Instructions (Addendum)
Medication Instructions:  Your physician recommends that you continue on your current medications as directed. Please refer to the Current Medication list given to you today.  Labwork: None ordered.  Testing/Procedures: None ordered.  Follow-Up: Your physician wants you to follow-up in: as needed with Dr. Allred.       Any Other Special Instructions Will Be Listed Below (If Applicable).  If you need a refill on your cardiac medications before your next appointment, please call your pharmacy.   

## 2017-10-31 NOTE — Progress Notes (Signed)
Electrophysiology Office Note   Date:  10/31/2017   ID:  Stephanie, Manning [birth year], MRN [id number]  PCP:  [name], MD  Cardiologist:  Dr [name] Primary CHF:  Dr [name]    CC: CHF   History of Present Illness: Stephanie Manning is a 57 y.o. female who presents today for electrophysiology evaluation.   She has a h/o ischemic CM (EF 30%), CAD, NYHA Class III CHF, CRI, who presents on referral by Dr [name] for EP consult regarding risks of sudden death.  She had anterior MI 03-25-23 with DES to totally occluded LAD.  She had staged PCI of RCA 1/19.  EF remains <35%.  She has been treated with optimal medical therapy.  She underwent cath 7/19 for ongoing issues with chest pain.  Medical management was advised.  She has SOB with moderate activity.  Today, she denies symptoms of palpitations, orthopnea, PND, lower extremity edema, claudication, dizziness, presyncope, syncope, bleeding, or neurologic sequela. The patient is tolerating medications without difficulties and is otherwise without complaint today.    Past Medical History:  Diagnosis Date  . AKI (acute kidney injury) ([place]) 02/2017  . CAD in native artery    a. NSTEMI 02/2017 s/p DES to mLAD, diffuse disease otherwise, EF 30-35%. b. Staged cath 03/2017 - patent stent in the proximal to mid LAD. CTO of the distal LAD, 90% diffuse small first diagonal, 90% distal LCx/OM, 80% diffuse prox RCA, moderately elevated LVEDP 80mmHg, s/p DES to mRCA, residual disease treated medically (too small for PCI), EF 30-35%.  . Chronic combined systolic and diastolic CHF (congestive heart failure) ([place])   . CKD (chronic kidney disease), stage IV ([place])    [name] 04/21/2017  . Diabetes mellitus, type II, insulin dependent ([place])   . GERD (gastroesophageal reflux disease)   . History of blood transfusion    "related to menses"  . History of stomach ulcers 2017  . Hyperlipidemia   . Hypertension   . Iron deficiency anemia 03/2017   "had  to have iron infusions" (10/10/2017)  . Ischemic cardiomyopathy   . STEMI (ST elevation myocardial infarction) ([place]) 02/2017   Past Surgical History:  Procedure Laterality Date  . CESAREAN SECTION  1987  . CORONARY ANGIOPLASTY WITH STENT PLACEMENT  04/01/2017   drug-eluting stent was successfully placed using a STENT SYNERGY DES 2.24M25  . CORONARY STENT INTERVENTION N/A 04/01/2017   Procedure: CORONARY STENT INTERVENTION;  Surgeon: [name], [name], MD;  Location: [facility];  Service: Cardiovascular;  Laterality: N/A;  . CORONARY/GRAFT ACUTE MI REVASCULARIZATION N/A 03/10/2017   Procedure: Coronary/Graft Acute MI Revascularization;  Surgeon: [name], [name], MD;  Location: [facility];  Service: Cardiovascular;  Laterality: N/A;  . LEFT HEART CATH AND CORONARY ANGIOGRAPHY N/A 03/10/2017   Procedure: LEFT HEART CATH AND CORONARY ANGIOGRAPHY;  Surgeon: [name], [name], MD;  Location: [facility];  Service: Cardiovascular;  Laterality: N/A;  . LEFT HEART CATH AND CORONARY ANGIOGRAPHY N/A 04/01/2017   Procedure: LEFT HEART CATH AND CORONARY ANGIOGRAPHY;  Surgeon: [name], [name], MD;  Location: [facility];  Service: Cardiovascular;  Laterality: N/A;  . LEFT HEART CATH AND CORONARY ANGIOGRAPHY N/A 10/12/2017   Procedure: LEFT HEART CATH AND CORONARY ANGIOGRAPHY;  Surgeon: Sherren Mocha, MD;  Location: [facility];  Service: Cardiovascular;  Laterality: N/A;  . OOPHORECTOMY     "1 ovary removed"  . THROAT SURGERY  02/2014   "nodule in my  throat removed; not related to thyroid"     Current Outpatient Medications  Medication Sig Dispense Refill  . aspirin 81 MG chewable tablet Chew 1 tablet (81 mg total) by mouth daily.    Marland Kitchen atorvastatin (LIPITOR) 80 MG tablet Take 1 tablet (80 mg total) by mouth daily at 6 PM. 90 tablet 1  . carvedilol (COREG) 6.25 MG tablet Take 1 tablet (6.25 mg total) by mouth 2 (two) times daily with a meal. 180 tablet 4  . furosemide  (LASIX) 20 MG tablet Take 1 tablet (20 mg total) by mouth every other day. 15 tablet 4  . gabapentin (NEURONTIN) 300 MG capsule Take 600 mg by mouth at bedtime.    . insulin aspart (NOVOLOG FLEXPEN) 100 UNIT/ML FlexPen Inject 2-10 Units into the skin See admin instructions. Inject 2-10 units into the skin three times a day before meals as needed for elevated BGL, per sliding scale    . insulin detemir (LEVEMIR) 100 UNIT/ML injection Inject 0.25 mLs (25 Units total) into the skin 2 (two) times daily. Patient uses sliding scale at home per primary care provider. (Patient taking differently: Inject 25-50 Units into the skin See admin instructions. Inject 25-50 units into the skin two times a day before meals, per sliding scale)    . isosorbide mononitrate (IMDUR) 60 MG 24 hr tablet Take 1 tablet (60 mg total) by mouth daily. 90 tablet 4  . Multiple Vitamin (MULTIVITAMIN WITH MINERALS) TABS tablet Take 1 tablet by mouth daily.    . nitroGLYCERIN (NITROSTAT) 0.4 MG SL tablet Place 1 tablet (0.4 mg total) under the tongue every 5 (five) minutes as needed for chest pain. 25 tablet 2  . pantoprazole (PROTONIX) 40 MG tablet Take 1 tablet (40 mg total) by mouth 2 (two) times daily. 180 tablet 1  . potassium chloride SA (K-DUR,KLOR-CON) 20 MEQ tablet Take 1 tablet (20 mEq total) by mouth daily. 90 tablet 1  . sacubitril-valsartan (ENTRESTO) 49-51 MG Take 1 tablet by mouth 2 (two) times daily. 60 tablet 3  . Saline 0.2 % SOLN Place 1 spray into both nostrils as needed (congestion).     Marland Kitchen spironolactone (ALDACTONE) 25 MG tablet Take 0.5 tablets (12.5 mg total) by mouth at bedtime. 15 tablet 3  . ticagrelor (BRILINTA) 90 MG TABS tablet Take 1 tablet (90 mg total) by mouth 2 (two) times daily. 180 tablet 2  . tiZANidine (ZANAFLEX) 4 MG tablet Take 4 mg by mouth at bedtime.      No current facility-administered medications for this visit.     Allergies:   Amoxicillin; Hydrocodone; Lisinopril; Meloxicam; Strawberry  extract; Tape; and Toradol [ketorolac tromethamine]   Social History:  The patient  reports that she has never smoked. She has never used smokeless tobacco. She reports that she does not drink alcohol or use drugs.   Family History:  The patient's  family history includes Diabetes in her mother; Heart disease in her mother; Hypertension in her mother; Stroke in her mother.    ROS:  Please see the history of present illness.   All other systems are personally reviewed and negative.    PHYSICAL EXAM: VS:  BP 112/66   Pulse 79   Ht 5\' 8"  (1.727 m)   Wt 239 lb 12.8 oz (108.8 kg)   SpO2 98%   BMI 36.46 kg/m  , BMI Body mass index is 36.46 kg/m. GEN: Well nourished, well developed, in no acute distress  HEENT: normal  Neck: no JVD,  carotid bruits, or masses Cardiac: RRR; no murmurs, rubs, or gallops,no edema  Respiratory:  clear to auscultation bilaterally, normal work of breathing GI: soft, nontender, nondistended, + BS MS: no deformity or atrophy  Skin: warm and dry  Neuro:  Strength and sensation are intact Psych: euthymic mood, full affect  EKG:  EKG is ordered today. The ekg ordered today is personally reviewed and shows sinus rhythm 79 bpm, PR 152 msec, QRS 86 msec, Qtc 403 msec, lateral t wave changes similar to ekg 10/12/17   Recent Labs: 03/15/2017: ALT 22 03/30/2017: B Natriuretic Peptide 462.4 09/27/2017: Magnesium 2.0 10/12/2017: Hemoglobin 9.8; Platelets 175 10/19/2017: BUN 26; Creatinine, Ser 1.78; Potassium 5.0; Sodium 142  personally reviewed   Lipid Panel     Component Value Date/Time   CHOL 117 08/02/2017 1525   TRIG 61 08/02/2017 1525   HDL 40 (L) 08/02/2017 1525   CHOLHDL 2.9 08/02/2017 1525   VLDL 12 08/02/2017 1525   LDLCALC 65 08/02/2017 1525   personally reviewed   Wt Readings from Last 3 Encounters:  10/31/17 239 lb 12.8 oz (108.8 kg)  10/19/17 240 lb (108.9 kg)  10/13/17 235 lb 14.3 oz (107 kg)      Other studies personally  reviewed: Additional studies/ records that were reviewed today include: echo 06/07/17 and cath 10/12/17  Review of the above records today demonstrates: as above  ASSESSMENT AND PLAN:  1.  The patient has an ischemic CM (EF 30%), NYHA Class III CHF, and CAD.  She is referred by Dr [name] for risk stratification of sudden death and consideration of ICD implantation.  At this time, she meets MADIT II/ SCD-HeFT criteria for ICD implantation for primary prevention of sudden death.  She has QRS < 130 msec and therefore is not a candidate for CRT.  I have had a thorough discussion with the patient reviewing options.  The patient and their family have had opportunities to ask questions and have them answered. The patient and I have decided together through a shared decision making process to not proceed with ICD at this time.   She is very clear that she is not interested.  She has been made aware of pros and cons of ICD implant.  She is aware of her risks of sudden death.  She states "God will determine when my time comes.  I do not feel that I need a defibrillator".  Follow-up with Dr [name] as scheduled I will see as needed going forward.  Current medicines are reviewed at length with the patient today.   The patient does not have concerns regarding her medicines.  The following changes were made today:  none  Labs/ tests ordered today include:  Orders Placed This Encounter  Procedures  . EKG 12-Lead     Signed, Thompson Grayer, MD  10/31/2017 11:45 AM     [facility] [address] [place] [address] [phone] (office) [phone] (fax)

## 2017-11-01 ENCOUNTER — Encounter (HOSPITAL_COMMUNITY): Payer: Self-pay

## 2017-11-08 ENCOUNTER — Telehealth (HOSPITAL_COMMUNITY): Payer: Self-pay | Admitting: *Deleted

## 2017-11-08 NOTE — Telephone Encounter (Signed)
[name], MD  to Stephanie Manning        11:00 PM  Those BP readings are too high. To try to even them out more, would like to increase your Entresto. I will have my nurse call you to discuss.  Dalton [name]    This MyChart message has not been read.  [name], MD  to Me        10:59 PM   BP too high, would have her increase Entresto to 97/103 bid with BMET in 1 week.  Me  to [name], MD        8:31 AM  Pt is on Carvedilol 6.25 mg BID, Spiro 12.5 mg daily and Entresto 49/51 mg BID, she is scheduled for f/u with you on 11/15/17, please advise regarding BP readings.  November 01, 2017  Stephanie Manning  to [name], MD        11:59 AM  I'm having periods of my blood pressure being high and then normal yesterday after leaving the doctor's office at 3:15 my blood pressure was 172 over 84 pulse 92 then at 3:35 p.m. it was 133 85 at 3 a.m. was 129 over 74 11:17 a.m. was 142 over 104 pulse 79 at 11:21 a.m. it was 152 over 86 then at 11:45am today 126 / 72 post 84 is this normal due to my heart attack

## 2017-11-08 NOTE — Telephone Encounter (Signed)
Called pt to discuss, she states when she increased Entresto in the past she could not tolerate the 97/103 dose due to dizziness.  She reports BP has been running normal the past few days and she does not want to change med right now.  She will check her BP daily and bring readings to her appt w/Dr [name] on 11/15/17 for further evaluation and discussion.

## 2017-11-15 ENCOUNTER — Ambulatory Visit (HOSPITAL_COMMUNITY)
Admission: RE | Admit: 2017-11-15 | Discharge: 2017-11-15 | Disposition: A | Discharge status: Home / Self Care | Payer: Medicare Other | Source: Ambulatory Visit | Attending: Cardiology | Admitting: Cardiology

## 2017-11-15 VITALS — BP 160/86 | HR 80 | Wt 240.6 lb

## 2017-11-15 DIAGNOSIS — E785 Hyperlipidemia, unspecified: Secondary | ICD-10-CM | POA: Insufficient documentation

## 2017-11-15 DIAGNOSIS — N183 Chronic kidney disease, stage 3 (moderate): Secondary | ICD-10-CM | POA: Insufficient documentation

## 2017-11-15 DIAGNOSIS — I255 Ischemic cardiomyopathy: Secondary | ICD-10-CM | POA: Diagnosis not present

## 2017-11-15 DIAGNOSIS — I130 Hypertensive heart and chronic kidney disease with heart failure and stage 1 through stage 4 chronic kidney disease, or unspecified chronic kidney disease: Secondary | ICD-10-CM | POA: Insufficient documentation

## 2017-11-15 DIAGNOSIS — Z794 Long term (current) use of insulin: Secondary | ICD-10-CM | POA: Diagnosis not present

## 2017-11-15 DIAGNOSIS — I252 Old myocardial infarction: Secondary | ICD-10-CM | POA: Insufficient documentation

## 2017-11-15 DIAGNOSIS — E1122 Type 2 diabetes mellitus with diabetic chronic kidney disease: Secondary | ICD-10-CM | POA: Insufficient documentation

## 2017-11-15 DIAGNOSIS — Z7982 Long term (current) use of aspirin: Secondary | ICD-10-CM | POA: Insufficient documentation

## 2017-11-15 DIAGNOSIS — Z955 Presence of coronary angioplasty implant and graft: Secondary | ICD-10-CM | POA: Insufficient documentation

## 2017-11-15 DIAGNOSIS — I5042 Chronic combined systolic (congestive) and diastolic (congestive) heart failure: Secondary | ICD-10-CM

## 2017-11-15 DIAGNOSIS — K219 Gastro-esophageal reflux disease without esophagitis: Secondary | ICD-10-CM | POA: Insufficient documentation

## 2017-11-15 DIAGNOSIS — D509 Iron deficiency anemia, unspecified: Secondary | ICD-10-CM | POA: Insufficient documentation

## 2017-11-15 DIAGNOSIS — I2510 Atherosclerotic heart disease of native coronary artery without angina pectoris: Secondary | ICD-10-CM

## 2017-11-15 DIAGNOSIS — N184 Chronic kidney disease, stage 4 (severe): Secondary | ICD-10-CM | POA: Diagnosis not present

## 2017-11-15 DIAGNOSIS — Z79899 Other long term (current) drug therapy: Secondary | ICD-10-CM | POA: Insufficient documentation

## 2017-11-15 DIAGNOSIS — I5022 Chronic systolic (congestive) heart failure: Secondary | ICD-10-CM | POA: Diagnosis present

## 2017-11-15 LAB — BASIC METABOLIC PANEL
ANION GAP: 8 (ref 5–15)
BUN: 17 mg/dL (ref 6–20)
CHLORIDE: 113 mmol/L — AB (ref 98–111)
CO2: 20 mmol/L — ABNORMAL LOW (ref 22–32)
Calcium: 9.2 mg/dL (ref 8.9–10.3)
Creatinine, Ser: 1.6 mg/dL — ABNORMAL HIGH (ref 0.44–1.00)
GFR, EST AFRICAN AMERICAN: 40 mL/min — AB (ref >60)
GFR, EST NON AFRICAN AMERICAN: 34 mL/min — AB (ref >60)
Glucose, Bld: 47 mg/dL — ABNORMAL LOW (ref 70–99)
Potassium: 3.8 mmol/L (ref 3.5–5.1)
SODIUM: 141 mmol/L (ref 135–145)

## 2017-11-15 MED ORDER — CARVEDILOL 12.5 MG PO TABS: 12.5000 mg | ORAL_TABLET | Freq: Two times a day (BID) | ORAL | 6 refills | Status: DC

## 2017-11-15 NOTE — Patient Instructions (Signed)
Increase Carvedilol to 9.375 mg (1 & 1/2 tabs) Twice daily FOR 5 DAYS, then increase to 12.5 mg Twice daily   Labs today  Ok to restart Cardiac rehab  Your physician recommends that you schedule a follow-up appointment in: 3 months

## 2017-11-16 NOTE — Progress Notes (Signed)
PCP: Dr. Margo Common Cardiology: Dr. [name] HF Cardiology: Dr. [name]  57 yo with history of CAD and ischemic cardiomyopathy was referred by Jory Sims for evaluation of CHF.  In 12/18, she had anterior MI with DES to totally occluded mid LAD (culprit). She returned in 1/19 for staged DES to 80% RCA stenosis. Echo in 12/18 showed EF 30-35%.  Repeat echo in 3/19 showed EF stable at 30-35%.    She was admitted in 7/19 with chest pain, concern for unstable angina.  Culprit of symptoms was thought to be 95% stenosis in a small PLV vessel, medical treatment planned .   She saw Dr. Rayann Heman and decided against ICD.   She returns for followup of CHF and CAD.  She is doing well symptomatically.  No dyspnea walking on flat ground or up a flight of steps.  No chest pain recently or NTG use. No orthopnea/PND.  Weight down 2 lbs.   Labs (2/19): K 4.7, creatinine 1.48, hgb 8.4 Labs (5/19): LDL 65, K 4.7, creatinine 1.66 Labs (8/19): K 5, creatinine 1.78  PMH: 1. Type II diabetes 2. CKD stage 3.  3. Hyperlipidemia 4. HTN 5. Fe deficiency anemia 6. GERD 7. CAD: Anterior MI in 12/18 with DES to 100% stenosed mid LAD (culprit), also with CTO of distal LAD, 80% pRCA, 85% mLCx/90% dLCx, 90% OM3.  - 1/19 staged PCI of proximal RCA.  - Admitted with unstable angina 7/19.  LHC: 75% stenosis proximal-mid LCx, 60% OM1, total occlusion of the distal LAD, patent mid LAD stent, patent RCA stents, 95% stenosis in small PLV branch (likely culprit).  8. Chronic systolic CHF: Ischemic cardiomyopathy.  - Echo (12/18): EF 30-35%.  - Echo (3/19): EF 30-35%, WMAs in LAD territory, RV mildly dilated.   Social History   Socioeconomic History  . Marital status: Widowed    Spouse name: Not on file  . Number of children: Not on file  . Years of education: Not on file  . Highest education level: Not on file  Occupational History  . Not on file  Social Needs  . Financial resource strain: Not on file  . Food  insecurity:    Worry: Not on file    Inability: Not on file  . Transportation needs:    Medical: Not on file    Non-medical: Not on file  Tobacco Use  . Smoking status: Never Smoker  . Smokeless tobacco: Never Used  Substance and Sexual Activity  . Alcohol use: Never    Frequency: Never  . Drug use: Never  . Sexual activity: Yes  Lifestyle  . Physical activity:    Days per week: Not on file    Minutes per session: Not on file  . Stress: Not on file  Relationships  . Social connections:    Talks on phone: Not on file    Gets together: Not on file    Attends religious service: Not on file    Active member of club or organization: Not on file    Attends meetings of clubs or organizations: Not on file    Relationship status: Not on file  . Intimate partner violence:    Fear of current or ex partner: Not on file    Emotionally abused: Not on file    Physically abused: Not on file    Forced sexual activity: Not on file  Other Topics Concern  . Not on file  Social History Narrative  . Not on file   Family History  Problem Relation Age of Onset  . Hypertension Mother   . Heart disease Mother   . Diabetes Mother   . Stroke Mother    ROS: All systems reviewed and negative except as per HPI.   Current Outpatient Medications  Medication Sig Dispense Refill  . aspirin 81 MG chewable tablet Chew 1 tablet (81 mg total) by mouth daily.    Marland Kitchen atorvastatin (LIPITOR) 80 MG tablet Take 1 tablet (80 mg total) by mouth daily at 6 PM. 90 tablet 1  . carvedilol (COREG) 12.5 MG tablet Take 1 tablet (12.5 mg total) by mouth 2 (two) times daily with a meal. 60 tablet 6  . furosemide (LASIX) 20 MG tablet Take 1 tablet (20 mg total) by mouth every other day. 15 tablet 4  . gabapentin (NEURONTIN) 300 MG capsule Take 600 mg by mouth at bedtime.    . insulin aspart (NOVOLOG FLEXPEN) 100 UNIT/ML FlexPen Inject 2-10 Units into the skin See admin instructions. Inject 2-10 units into the skin three  times a day before meals as needed for elevated BGL, per sliding scale    . insulin detemir (LEVEMIR) 100 UNIT/ML injection Inject 0.25 mLs (25 Units total) into the skin 2 (two) times daily. Patient uses sliding scale at home per primary care provider. (Patient taking differently: Inject 25-50 Units into the skin See admin instructions. Inject 25-50 units into the skin two times a day before meals, per sliding scale)    . isosorbide mononitrate (IMDUR) 60 MG 24 hr tablet Take 1 tablet (60 mg total) by mouth daily. 90 tablet 4  . Multiple Vitamin (MULTIVITAMIN WITH MINERALS) TABS tablet Take 1 tablet by mouth daily.    . nitroGLYCERIN (NITROSTAT) 0.4 MG SL tablet Place 1 tablet (0.4 mg total) under the tongue every 5 (five) minutes as needed for chest pain. 25 tablet 2  . pantoprazole (PROTONIX) 40 MG tablet Take 1 tablet (40 mg total) by mouth 2 (two) times daily. 180 tablet 1  . potassium chloride SA (K-DUR,KLOR-CON) 20 MEQ tablet Take 1 tablet (20 mEq total) by mouth daily. 90 tablet 1  . sacubitril-valsartan (ENTRESTO) 49-51 MG Take 1 tablet by mouth 2 (two) times daily. 60 tablet 3  . Saline 0.2 % SOLN Place 1 spray into both nostrils as needed (congestion).     Marland Kitchen spironolactone (ALDACTONE) 25 MG tablet Take 0.5 tablets (12.5 mg total) by mouth at bedtime. 15 tablet 3  . ticagrelor (BRILINTA) 90 MG TABS tablet Take 1 tablet (90 mg total) by mouth 2 (two) times daily. 180 tablet 2  . tiZANidine (ZANAFLEX) 4 MG tablet Take 4 mg by mouth at bedtime.      No current facility-administered medications for this encounter.    BP (!) 160/86   Pulse 80   Wt 109.1 kg (240 lb 9.6 oz)   SpO2 98%   BMI 36.58 kg/m  General: NAD Neck: No JVD, no thyromegaly or thyroid nodule.  Lungs: Clear to auscultation bilaterally with normal respiratory effort. CV: Nondisplaced PMI.  Heart regular S1/S2, no S3/S4, no murmur.  No peripheral edema.  No carotid bruit.  Normal pedal pulses.  Abdomen: Soft, nontender,  no hepatosplenomegaly, no distention.  Skin: Intact without lesions or rashes.  Neurologic: Alert and oriented x 3.  Psych: Normal affect. Extremities: No clubbing or cyanosis.  HEENT: Normal.   Assessment/Plan: 1. CAD: Anterior MI in 12/18 with DES to proximal LAD (culprit) followed by staged DES to proximal RCA.  7/19 admission with  unstable angina, likely culprit was 95% stenosis in small PLV.  This was managed conservatively.  She has had no recent chest pain.    - Continue Imdur 60 mg daily.  - Continue atorvastatin, good lipids in 5/19.   - Continue ASA 81 daily and ticagrelor 90 bid.  In 12/19, would decrease ticagrelor to 60 mg bid.  2. Chronic systolic CHF: Ischemic cardiomyopathy. EF has been stably low in the 30-35% range.  NYHA class II symptoms.  She is not volume overloaded on exam.  She saw Dr. Rayann Heman in the office and decided against ICD.  Narrow QRS, so not CRT candidate.  - Increase Coreg to 9.375 mg bid x 5 days then 12.5 mg bid after that.  - Continue Entresto 49/51 bid.   - Continue Lasix 20 mg every other day.  - Continue spironolactone 12.5 daily.   - BMET today.   3. CKD: stage 3 . Check BMET today.  4. Fe deficiency anemia: Long-standing.  She has had IV Fe infusions.   Followup in 3 months   Loralie Champagne 11/16/2017

## 2017-12-07 ENCOUNTER — Encounter (INDEPENDENT_AMBULATORY_CARE_PROVIDER_SITE_OTHER): Payer: Self-pay | Admitting: Ophthalmology

## 2017-12-12 ENCOUNTER — Encounter (INDEPENDENT_AMBULATORY_CARE_PROVIDER_SITE_OTHER): Payer: Medicare Other | Admitting: Ophthalmology

## 2017-12-12 DIAGNOSIS — H2513 Age-related nuclear cataract, bilateral: Secondary | ICD-10-CM

## 2017-12-12 DIAGNOSIS — E103313 Type 1 diabetes mellitus with moderate nonproliferative diabetic retinopathy with macular edema, bilateral: Secondary | ICD-10-CM | POA: Diagnosis not present

## 2017-12-12 DIAGNOSIS — H35033 Hypertensive retinopathy, bilateral: Secondary | ICD-10-CM | POA: Diagnosis not present

## 2017-12-12 DIAGNOSIS — E10311 Type 1 diabetes mellitus with unspecified diabetic retinopathy with macular edema: Secondary | ICD-10-CM

## 2017-12-12 DIAGNOSIS — I10 Essential (primary) hypertension: Secondary | ICD-10-CM

## 2017-12-12 DIAGNOSIS — H43813 Vitreous degeneration, bilateral: Secondary | ICD-10-CM

## 2017-12-29 ENCOUNTER — Other Ambulatory Visit: Payer: Self-pay | Admitting: Adult Health

## 2018-01-09 ENCOUNTER — Encounter (INDEPENDENT_AMBULATORY_CARE_PROVIDER_SITE_OTHER): Payer: Medicare Other | Admitting: Ophthalmology

## 2018-02-07 ENCOUNTER — Other Ambulatory Visit: Payer: Self-pay | Admitting: Cardiology

## 2018-02-07 ENCOUNTER — Encounter (HOSPITAL_COMMUNITY): Payer: Self-pay

## 2018-02-08 ENCOUNTER — Other Ambulatory Visit (HOSPITAL_COMMUNITY): Payer: Self-pay

## 2018-02-08 MED ORDER — TICAGRELOR 90 MG PO TABS: 90.0000 mg | ORAL_TABLET | Freq: Two times a day (BID) | ORAL | 2 refills | Status: DC

## 2018-02-08 NOTE — Telephone Encounter (Signed)
Outpatient Medication Detail    Disp Refills Start End   ticagrelor (BRILINTA) 90 MG TABS tablet 180 tablet 2 02/08/2018    Sig - Route: Take 1 tablet (90 mg total) by mouth 2 (two) times daily. - Oral   Sent to pharmacy as: ticagrelor (BRILINTA) 90 MG Tab tablet   E-Prescribing Status: Receipt confirmed by pharmacy (02/08/2018 8:57 AM EST)   Pharmacy   [facility] [place], [place]

## 2018-02-14 ENCOUNTER — Ambulatory Visit (HOSPITAL_COMMUNITY)
Admission: RE | Admit: 2018-02-14 | Discharge: 2018-02-14 | Disposition: A | Discharge status: Home / Self Care | Payer: Medicare Other | Source: Ambulatory Visit | Attending: Cardiology | Admitting: Cardiology

## 2018-02-14 ENCOUNTER — Encounter (HOSPITAL_COMMUNITY): Payer: Self-pay

## 2018-02-14 VITALS — BP 160/80 | HR 84 | Wt 232.2 lb

## 2018-02-14 DIAGNOSIS — I5022 Chronic systolic (congestive) heart failure: Secondary | ICD-10-CM | POA: Diagnosis present

## 2018-02-14 DIAGNOSIS — E785 Hyperlipidemia, unspecified: Secondary | ICD-10-CM | POA: Diagnosis not present

## 2018-02-14 DIAGNOSIS — I2510 Atherosclerotic heart disease of native coronary artery without angina pectoris: Secondary | ICD-10-CM | POA: Insufficient documentation

## 2018-02-14 DIAGNOSIS — D509 Iron deficiency anemia, unspecified: Secondary | ICD-10-CM | POA: Insufficient documentation

## 2018-02-14 DIAGNOSIS — Z8249 Family history of ischemic heart disease and other diseases of the circulatory system: Secondary | ICD-10-CM | POA: Insufficient documentation

## 2018-02-14 DIAGNOSIS — I255 Ischemic cardiomyopathy: Secondary | ICD-10-CM | POA: Diagnosis not present

## 2018-02-14 DIAGNOSIS — I130 Hypertensive heart and chronic kidney disease with heart failure and stage 1 through stage 4 chronic kidney disease, or unspecified chronic kidney disease: Secondary | ICD-10-CM | POA: Insufficient documentation

## 2018-02-14 DIAGNOSIS — K219 Gastro-esophageal reflux disease without esophagitis: Secondary | ICD-10-CM | POA: Diagnosis not present

## 2018-02-14 DIAGNOSIS — Z79899 Other long term (current) drug therapy: Secondary | ICD-10-CM | POA: Diagnosis not present

## 2018-02-14 DIAGNOSIS — I5042 Chronic combined systolic (congestive) and diastolic (congestive) heart failure: Secondary | ICD-10-CM | POA: Diagnosis not present

## 2018-02-14 DIAGNOSIS — Z7982 Long term (current) use of aspirin: Secondary | ICD-10-CM | POA: Diagnosis not present

## 2018-02-14 DIAGNOSIS — Z794 Long term (current) use of insulin: Secondary | ICD-10-CM | POA: Diagnosis not present

## 2018-02-14 DIAGNOSIS — I252 Old myocardial infarction: Secondary | ICD-10-CM | POA: Diagnosis not present

## 2018-02-14 DIAGNOSIS — Z833 Family history of diabetes mellitus: Secondary | ICD-10-CM | POA: Diagnosis not present

## 2018-02-14 DIAGNOSIS — E1122 Type 2 diabetes mellitus with diabetic chronic kidney disease: Secondary | ICD-10-CM | POA: Insufficient documentation

## 2018-02-14 DIAGNOSIS — N183 Chronic kidney disease, stage 3 (moderate): Secondary | ICD-10-CM | POA: Diagnosis not present

## 2018-02-14 LAB — BASIC METABOLIC PANEL
Anion gap: 9 (ref 5–15)
BUN: 25 mg/dL — AB (ref 6–20)
CALCIUM: 9 mg/dL (ref 8.9–10.3)
CHLORIDE: 108 mmol/L (ref 98–111)
CO2: 21 mmol/L — AB (ref 22–32)
CREATININE: 1.59 mg/dL — AB (ref 0.44–1.00)
GFR calc Af Amer: 41 mL/min — ABNORMAL LOW (ref >60)
GFR calc non Af Amer: 35 mL/min — ABNORMAL LOW (ref >60)
Glucose, Bld: 96 mg/dL (ref 70–99)
Potassium: 3.9 mmol/L (ref 3.5–5.1)
SODIUM: 138 mmol/L (ref 135–145)

## 2018-02-14 MED ORDER — TICAGRELOR 60 MG PO TABS: 60.0000 mg | ORAL_TABLET | Freq: Two times a day (BID) | ORAL | 6 refills | Status: DC

## 2018-02-14 MED ORDER — SACUBITRIL-VALSARTAN 49-51 MG PO TABS: 1.0000 | ORAL_TABLET | Freq: Two times a day (BID) | ORAL | 6 refills | Status: DC

## 2018-02-14 NOTE — Patient Instructions (Addendum)
DISCONTINUE Lasix  DECREASE Ticagrelor to 60mg  (1 tab) twice a day  INCREASE Entresto to 49/51mg  (1 tab) twice a day.  Labs today We will only contact you if something comes back abnormal or we need to make some changes. Otherwise no news is good news!  Repeat Labs in 10 days  Your physician recommends that you schedule a follow-up appointment in: 3 weeks with Pharmacy Clinic.  Your physician recommends that you schedule a follow-up appointment in: 3 months with Dr. [name].

## 2018-02-15 NOTE — Progress Notes (Signed)
PCP: Dr. Margo Common Cardiology: Dr. [name] HF Cardiology: Dr. [name]  57 yo with history of CAD and ischemic cardiomyopathy was referred by Jory Sims for evaluation of CHF.  In 12/18, she had anterior MI with DES to totally occluded mid LAD (culprit). She returned in 1/19 for staged DES to 80% RCA stenosis. Echo in 12/18 showed EF 30-35%.  Repeat echo in 3/19 showed EF stable at 30-35%.    She was admitted in 7/19 with chest pain, concern for unstable angina.  Culprit of symptoms was thought to be 95% stenosis in a small PLV vessel, medical treatment planned .   She saw Dr. Rayann Heman and decided against ICD.   She returns for followup of CHF and CAD.  She is doing well symptomatically.  Breathing is getting better.  Able to walk on treadmill or ride exercise bike for 30 minutes without dyspnea.  Mild dyspnea with stairs.  No chest pain.  No lightheadedness or palpitations.  She has had some trouble with managing her medications, only taking Coreg 6.25 mg bid when I thought she was taking 12.5 mg bid. Weight is down 8 lbs. She is doing cardiac rehab. BP high today.   Labs (2/19): K 4.7, creatinine 1.48, hgb 8.4 Labs (5/19): LDL 65, K 4.7, creatinine 1.66 Labs (8/19): K 5, creatinine 1.78  Labs (9/19): K 3.8, creatinine 1.6  PMH: 1. Type II diabetes 2. CKD stage 3.  3. Hyperlipidemia 4. HTN 5. Fe deficiency anemia 6. GERD 7. CAD: Anterior MI in 12/18 with DES to 100% stenosed mid LAD (culprit), also with CTO of distal LAD, 80% pRCA, 85% mLCx/90% dLCx, 90% OM3.  - 1/19 staged PCI of proximal RCA.  - Admitted with unstable angina 7/19.  LHC: 75% stenosis proximal-mid LCx, 60% OM1, total occlusion of the distal LAD, patent mid LAD stent, patent RCA stents, 95% stenosis in small PLV branch (likely culprit).  8. Chronic systolic CHF: Ischemic cardiomyopathy.  - Echo (12/18): EF 30-35%.  - Echo (3/19): EF 30-35%, WMAs in LAD territory, RV mildly dilated.   Social History   Socioeconomic  History  . Marital status: Widowed    Spouse name: Not on file  . Number of children: Not on file  . Years of education: Not on file  . Highest education level: Not on file  Occupational History  . Not on file  Social Needs  . Financial resource strain: Not on file  . Food insecurity:    Worry: Not on file    Inability: Not on file  . Transportation needs:    Medical: Not on file    Non-medical: Not on file  Tobacco Use  . Smoking status: Never Smoker  . Smokeless tobacco: Never Used  Substance and Sexual Activity  . Alcohol use: Never    Frequency: Never  . Drug use: Never  . Sexual activity: Yes  Lifestyle  . Physical activity:    Days per week: Not on file    Minutes per session: Not on file  . Stress: Not on file  Relationships  . Social connections:    Talks on phone: Not on file    Gets together: Not on file    Attends religious service: Not on file    Active member of club or organization: Not on file    Attends meetings of clubs or organizations: Not on file    Relationship status: Not on file  . Intimate partner violence:    Fear of current or ex  partner: Not on file    Emotionally abused: Not on file    Physically abused: Not on file    Forced sexual activity: Not on file  Other Topics Concern  . Not on file  Social History Narrative  . Not on file   Family History  Problem Relation Age of Onset  . Hypertension Mother   . Heart disease Mother   . Diabetes Mother   . Stroke Mother    ROS: All systems reviewed and negative except as per HPI.   Current Outpatient Medications  Medication Sig Dispense Refill  . aspirin 81 MG chewable tablet Chew 1 tablet (81 mg total) by mouth daily.    Marland Kitchen atorvastatin (LIPITOR) 80 MG tablet Take 1 tablet (80 mg total) by mouth daily at 6 PM. 90 tablet 1  . carvedilol (COREG) 6.25 MG tablet Take 6.25 mg by mouth 2 (two) times daily with a meal.    . gabapentin (NEURONTIN) 300 MG capsule Take 600 mg by mouth at bedtime.     . insulin aspart (NOVOLOG FLEXPEN) 100 UNIT/ML FlexPen Inject 2-10 Units into the skin See admin instructions. Inject 2-10 units into the skin three times a day before meals as needed for elevated BGL, per sliding scale    . insulin detemir (LEVEMIR) 100 UNIT/ML injection Inject 0.25 mLs (25 Units total) into the skin 2 (two) times daily. Patient uses sliding scale at home per primary care provider. (Patient taking differently: Inject 25-50 Units into the skin See admin instructions. Inject 25-50 units into the skin two times a day before meals, per sliding scale)    . isosorbide mononitrate (IMDUR) 60 MG 24 hr tablet Take 1 tablet (60 mg total) by mouth daily. 90 tablet 4  . Multiple Vitamin (MULTIVITAMIN WITH MINERALS) TABS tablet Take 1 tablet by mouth daily.    . pantoprazole (PROTONIX) 40 MG tablet TAKE ONE TABLET BY MOUTH TWICE A DAY 180 tablet 1  . potassium chloride SA (K-DUR,KLOR-CON) 20 MEQ tablet Take 1 tablet (20 mEq total) by mouth daily. 90 tablet 1  . Saline 0.2 % SOLN Place 1 spray into both nostrils as needed (congestion).     Marland Kitchen spironolactone (ALDACTONE) 25 MG tablet Take 0.5 tablets (12.5 mg total) by mouth at bedtime. 15 tablet 3  . ticagrelor (BRILINTA) 60 MG TABS tablet Take 1 tablet (60 mg total) by mouth 2 (two) times daily. 60 tablet 6  . tiZANidine (ZANAFLEX) 4 MG tablet Take 4 mg by mouth at bedtime.     . nitroGLYCERIN (NITROSTAT) 0.4 MG SL tablet Place 1 tablet (0.4 mg total) under the tongue every 5 (five) minutes as needed for chest pain. (Patient not taking: Reported on 02/14/2018) 25 tablet 2  . sacubitril-valsartan (ENTRESTO) 49-51 MG Take 1 tablet by mouth 2 (two) times daily. 60 tablet 6   No current facility-administered medications for this encounter.    BP (!) 160/80   Pulse 84   Wt 105.3 kg (232 lb 3.2 oz)   SpO2 99%   BMI 35.31 kg/m  General: NAD Neck: No JVD, no thyromegaly or thyroid nodule.  Lungs: Clear to auscultation bilaterally with normal  respiratory effort. CV: Nondisplaced PMI.  Heart regular S1/S2, no S3/S4, no murmur.  No peripheral edema.  No carotid bruit.  Normal pedal pulses.  Abdomen: Soft, nontender, no hepatosplenomegaly, no distention.  Skin: Intact without lesions or rashes.  Neurologic: Alert and oriented x 3.  Psych: Normal affect. Extremities: No clubbing or cyanosis.  HEENT: Normal.   Assessment/Plan: 1. CAD: Anterior MI in 12/18 with DES to proximal LAD (culprit) followed by staged DES to proximal RCA.  7/19 admission with unstable angina, likely culprit was 95% stenosis in small PLV.  This was managed conservatively.  She has had no recent chest pain.    - Continue Imdur 60 mg daily.  - Continue atorvastatin, good lipids in 5/19.   - Continue ASA 81 daily.  She has been 1 year post-MI, I will have her decrease ticagrelor to 60 mg bid.  2. Chronic systolic CHF: Ischemic cardiomyopathy. EF has been stably low in the 30-35% range.  NYHA class II symptoms.  She is not volume overloaded on exam.  She saw Dr. Rayann Heman in the office and decided against ICD.  Narrow QRS, so not CRT candidate.  - Keep Coreg at 6.25 mg bid for now.   - Increase Entresto to 49/51 bid and stop Lasix (can use prn at this point).  - Continue spironolactone 12.5 daily.   - BMET today and again in 10 days.   3. CKD: stage 3 . Check BMET today.  4. Fe deficiency anemia: Long-standing.  She has had IV Fe infusions.   I will have her followup in pharmacy clinic in 3 wks for med titration and reinforcement, she can have a couple of visits there then  I will see her in 3 months.   Loralie Champagne 02/15/2018

## 2018-02-24 ENCOUNTER — Ambulatory Visit (HOSPITAL_COMMUNITY)
Admission: RE | Admit: 2018-02-24 | Discharge: 2018-02-24 | Disposition: A | Discharge status: Home / Self Care | Payer: Medicare Other | Source: Ambulatory Visit | Attending: Cardiology | Admitting: Cardiology

## 2018-02-24 DIAGNOSIS — I5042 Chronic combined systolic (congestive) and diastolic (congestive) heart failure: Secondary | ICD-10-CM | POA: Diagnosis present

## 2018-03-13 ENCOUNTER — Encounter (HOSPITAL_COMMUNITY): Payer: Self-pay

## 2018-03-20 ENCOUNTER — Inpatient Hospital Stay (HOSPITAL_COMMUNITY)
Admission: RE | Admit: 2018-03-20 | Discharge: 2018-03-20 | Disposition: A | Discharge status: Home / Self Care | Payer: Medicare Other | Source: Ambulatory Visit

## 2018-03-23 ENCOUNTER — Encounter (HOSPITAL_COMMUNITY): Payer: Self-pay

## 2018-03-27 ENCOUNTER — Other Ambulatory Visit (HOSPITAL_COMMUNITY): Payer: Self-pay | Admitting: Cardiology

## 2018-03-31 ENCOUNTER — Other Ambulatory Visit (HOSPITAL_COMMUNITY): Payer: Self-pay | Admitting: Cardiology

## 2018-04-09 ENCOUNTER — Other Ambulatory Visit (HOSPITAL_COMMUNITY): Payer: Self-pay | Admitting: Cardiology

## 2018-04-10 ENCOUNTER — Inpatient Hospital Stay (HOSPITAL_COMMUNITY)
Admission: RE | Admit: 2018-04-10 | Discharge: 2018-04-10 | Disposition: A | Discharge status: Home / Self Care | Payer: Medicare Other | Source: Ambulatory Visit

## 2018-04-10 NOTE — Telephone Encounter (Signed)
This is Dr. Jordan's pt. °

## 2018-04-11 ENCOUNTER — Other Ambulatory Visit (HOSPITAL_COMMUNITY): Payer: Self-pay | Admitting: Cardiology

## 2018-04-14 ENCOUNTER — Other Ambulatory Visit (HOSPITAL_COMMUNITY): Payer: Self-pay | Admitting: Cardiology

## 2018-04-17 NOTE — Telephone Encounter (Signed)
Rx(s) sent to pharmacy electronically.  

## 2018-04-18 ENCOUNTER — Other Ambulatory Visit (HOSPITAL_COMMUNITY): Payer: Self-pay | Admitting: Cardiology

## 2018-04-24 ENCOUNTER — Ambulatory Visit (HOSPITAL_COMMUNITY)
Admission: RE | Admit: 2018-04-24 | Discharge: 2018-04-24 | Disposition: A | Discharge status: Home / Self Care | Payer: Medicare Other | Source: Ambulatory Visit | Attending: Cardiology | Admitting: Cardiology

## 2018-04-24 DIAGNOSIS — D509 Iron deficiency anemia, unspecified: Secondary | ICD-10-CM | POA: Insufficient documentation

## 2018-04-24 NOTE — Progress Notes (Signed)
PCP: Stephanie Manning Cardiology: Stephanie Manning HF Cardiology: Stephanie Manning  HPI:    58 yo with history of CAD and ischemic cardiomyopathy was referred by Stephanie Manning for evaluation of CHF.  In 12/18, she had anterior MI with DES to totally occluded mid LAD (culprit). She returned in 1/19 for staged DES to 80% RCA stenosis. Echo in 12/18 showed EF 30-35%.  Repeat echo in 3/19 showed EF stable at 30-35%.    She was admitted in 7/19 with chest pain, concern for unstable angina.  Culprit of symptoms was thought to be 95% stenosis in a small PLV vessel, medical treatment planned .   She saw Stephanie Manning and decided against ICD  She returns to clinic for pharmacy medication titration clinic.  She is able to walk without dyspnea. She thinks a lot of her SOB was d/t low iron and now that it  has been repleted with IV Iron she fells better.  [name] is taking oral iron.  No chest pain.  No lightheadedness or palpitations.  She has finished cardiac rehab but plans on getting into exercise program.  She states she is making healthier food choices.   She has had some trouble with managing her medications. She is supposed to be taking Coreg 12.5mg  BID but is taking Coreg 6.25 mg in am and 12.5mg  in pm.  She states she feels too lethargic, and dizzy with higher coreg dose during the day - however her BP in clinic today 148/72. She also only takes spironolactone 2-3 days a week. - she was blaming hand cramps on spironolactone but upon discussion realized she had been out of zanaflex when getting hand cramps.      . Shortness of breath/dyspnea on exertion? no only if rushing . Orthopnea/PND? no . Edema? no no . Lightheadedness/dizziness? No - unless tries to take coreg 12.5mg  in am . Daily weights at home? yes . Blood pressure/heart rate occ monitoring at home? Yes usually runs in 130s/70s . Following low-sodium/fluid-restricted diet? yes - uses small amount pink himalayan salt  HF Medications: Carvedilol 6.25mg   in am 12.5mg  in pm Imdur 60mg  daily Entresto 49/51mg  BID Spironlactone 12.5mg  2 days a week  Has the patient been experiencing any side effects to the medications prescribed?  yes low BP and dizzy if inc coreg 12.5 bid   Does the patient have any problems obtaining medications due to transportation or finances?   no  Understanding of regimen: good Understanding of indications: good Potential of compliance: fair Patient understands to avoid NSAIDs. Patient understands to avoid decongestants.    Pertinent Lab Values: 12/19 - states drawn recently at PCP - wnl . Serum creatinine 1.6 BUN 25, Potassium 3.9, Sodium 138,  Vital Signs: . Weight: 232lb (dry weight: same) . Blood pressure: 148/72 . Heart rate: 66 . O2 98%  Assessment: 1. Chronic systolic CHF (EF 23% ), due to ICM. NYHA class II symptoms. Current treatment Carvedilol 6.25mg  in am 12.5mg  in pm Entresto 49/51mg  BID Spironlactone 12.5mg  2 days a week She does weigh frequently with no change and no need for furosemide   - Basic disease state pathophysiology, medication indication, mechanism and side effects reviewed at length with patient and he verbalized understanding  2. CAD: Anterior MI in 12/18 with DES to proximal LAD (culprit) followed by staged DES to proximal RCA.  7/19 admission with unstable angina, likely culprit was 95% stenosis in small PLV.  This was managed conservatively.  She has had no recent chest pain.    -  Continue Imdur 60 mg daily.  - Continue atorvastatin, good lipids in 5/19.   - Continue ASA 81 daily.  - decrease ticagrelor to 60 mg bid - more than 1year post DES   Plan: 1) Medication changes: Based on clinical presentation no LE swelling, occasion SOB with moderate exertion, vital signs stable and recent labs wnl - will  Increase spironolactone to 12.5mg  Daily - appropriate dose She refuses to increase carvedilol at this time. 2) Labs: at next MD visit  3) Follow-up: Aundra Manning 05/16/18   Stephanie Manning Pharm.D. CPP, BCPS Clinical Pharmacist [phone] 05/03/2018 11:43 PM

## 2018-04-24 NOTE — Patient Instructions (Addendum)
Plan to take Spironolactone 12.5mg  (1/2tab) each day  Take Zanaflex at bedtime  Weigh each day Continue low fat/low salt diet  Follow up appointment with Dr. [name] March 3 at 1:40pm

## 2018-05-16 ENCOUNTER — Ambulatory Visit (HOSPITAL_COMMUNITY)
Admission: RE | Admit: 2018-05-16 | Discharge: 2018-05-16 | Disposition: A | Discharge status: Home / Self Care | Payer: Medicare Other | Source: Ambulatory Visit | Attending: Cardiology | Admitting: Cardiology

## 2018-05-16 ENCOUNTER — Encounter (HOSPITAL_COMMUNITY): Payer: Self-pay | Admitting: Cardiology

## 2018-05-16 VITALS — BP 130/78 | HR 76 | Wt 235.2 lb

## 2018-05-16 DIAGNOSIS — I2510 Atherosclerotic heart disease of native coronary artery without angina pectoris: Secondary | ICD-10-CM

## 2018-05-16 DIAGNOSIS — Z8249 Family history of ischemic heart disease and other diseases of the circulatory system: Secondary | ICD-10-CM | POA: Insufficient documentation

## 2018-05-16 DIAGNOSIS — I5042 Chronic combined systolic (congestive) and diastolic (congestive) heart failure: Secondary | ICD-10-CM | POA: Diagnosis not present

## 2018-05-16 DIAGNOSIS — Z79899 Other long term (current) drug therapy: Secondary | ICD-10-CM | POA: Diagnosis not present

## 2018-05-16 DIAGNOSIS — D509 Iron deficiency anemia, unspecified: Secondary | ICD-10-CM | POA: Insufficient documentation

## 2018-05-16 DIAGNOSIS — Z794 Long term (current) use of insulin: Secondary | ICD-10-CM | POA: Insufficient documentation

## 2018-05-16 DIAGNOSIS — E785 Hyperlipidemia, unspecified: Secondary | ICD-10-CM | POA: Diagnosis not present

## 2018-05-16 DIAGNOSIS — I130 Hypertensive heart and chronic kidney disease with heart failure and stage 1 through stage 4 chronic kidney disease, or unspecified chronic kidney disease: Secondary | ICD-10-CM | POA: Insufficient documentation

## 2018-05-16 DIAGNOSIS — Z955 Presence of coronary angioplasty implant and graft: Secondary | ICD-10-CM | POA: Insufficient documentation

## 2018-05-16 DIAGNOSIS — N183 Chronic kidney disease, stage 3 (moderate): Secondary | ICD-10-CM | POA: Insufficient documentation

## 2018-05-16 DIAGNOSIS — I252 Old myocardial infarction: Secondary | ICD-10-CM | POA: Insufficient documentation

## 2018-05-16 DIAGNOSIS — Z7982 Long term (current) use of aspirin: Secondary | ICD-10-CM | POA: Insufficient documentation

## 2018-05-16 DIAGNOSIS — E1122 Type 2 diabetes mellitus with diabetic chronic kidney disease: Secondary | ICD-10-CM | POA: Insufficient documentation

## 2018-05-16 DIAGNOSIS — Z7902 Long term (current) use of antithrombotics/antiplatelets: Secondary | ICD-10-CM | POA: Insufficient documentation

## 2018-05-16 DIAGNOSIS — I255 Ischemic cardiomyopathy: Secondary | ICD-10-CM | POA: Insufficient documentation

## 2018-05-16 DIAGNOSIS — K219 Gastro-esophageal reflux disease without esophagitis: Secondary | ICD-10-CM | POA: Insufficient documentation

## 2018-05-16 DIAGNOSIS — I5022 Chronic systolic (congestive) heart failure: Secondary | ICD-10-CM | POA: Diagnosis present

## 2018-05-16 LAB — BASIC METABOLIC PANEL
Anion gap: 8 (ref 5–15)
BUN: 26 mg/dL — ABNORMAL HIGH (ref 6–20)
CHLORIDE: 110 mmol/L (ref 98–111)
CO2: 22 mmol/L (ref 22–32)
CREATININE: 1.62 mg/dL — AB (ref 0.44–1.00)
Calcium: 8.8 mg/dL — ABNORMAL LOW (ref 8.9–10.3)
GFR calc Af Amer: 40 mL/min — ABNORMAL LOW (ref >60)
GFR calc non Af Amer: 35 mL/min — ABNORMAL LOW (ref >60)
Glucose, Bld: 126 mg/dL — ABNORMAL HIGH (ref 70–99)
Potassium: 4.1 mmol/L (ref 3.5–5.1)
Sodium: 140 mmol/L (ref 135–145)

## 2018-05-16 LAB — LIPID PANEL
Cholesterol: 127 mg/dL (ref 0–200)
HDL: 41 mg/dL (ref >40)
LDL Cholesterol: 73 mg/dL (ref 0–99)
Total CHOL/HDL Ratio: 3.1 RATIO
Triglycerides: 64 mg/dL (ref <150)
VLDL: 13 mg/dL (ref 0–40)

## 2018-05-16 MED ORDER — SPIRONOLACTONE 25 MG PO TABS: 25.0000 mg | ORAL_TABLET | Freq: Every evening | ORAL | 2 refills | Status: DC

## 2018-05-16 MED ORDER — CARVEDILOL 12.5 MG PO TABS: 12.5000 mg | ORAL_TABLET | Freq: Two times a day (BID) | ORAL | 3 refills | Status: DC

## 2018-05-16 NOTE — Patient Instructions (Addendum)
INCREASE Carvedilol to 9.375mg  in the AM and 12.5mg  in the PM for 5 days. Then Take 12.5mg  twice daily.  INCREASE Spironolactone to 25mg  daily.  Routine lab work today. Will notify you of abnormal results  Follow up and echo with Dr.McLean in 3 months.

## 2018-05-17 NOTE — Progress Notes (Signed)
PCP: Dr. Margo Common Cardiology: Dr. [name] HF Cardiology: Dr. [name]  58 y.o. with history of CAD and ischemic cardiomyopathy was referred by Jory Sims for evaluation of CHF.  In 12/18, she had anterior MI with DES to totally occluded mid LAD (culprit). She returned in 1/19 for staged DES to 80% RCA stenosis. Echo in 12/18 showed EF 30-35%.  Repeat echo in 3/19 showed EF stable at 30-35%.    She was admitted in 7/19 with chest pain, concern for unstable angina.  Culprit of symptoms was thought to be 95% stenosis in a small PLV vessel, medical treatment planned .   She saw Dr. Rayann Heman and decided against ICD.   She returns for followup of CHF and CAD.  Weight is up 3 lbs.  She uses Lasix only prn, took once last week. No chest pain, has used NTG once since last visit. No significant dyspnea with ADLs. No problems climbing a flight of stairs.  She has been taking Coreg 6.25 mg qam/12.5 mg qpm.  She got dizzy the last time she tried to take 12.5 mg bid.     Labs (2/19): K 4.7, creatinine 1.48, hgb 8.4 Labs (5/19): LDL 65, K 4.7, creatinine 1.66 Labs (8/19): K 5, creatinine 1.78  Labs (9/19): K 3.8, creatinine 1.6 Labs (12/19): K 3.9, creatinine 1.59  PMH: 1. Type II diabetes 2. CKD stage 3.  3. Hyperlipidemia 4. HTN 5. Fe deficiency anemia 6. GERD 7. CAD: Anterior MI in 12/18 with DES to 100% stenosed mid LAD (culprit), also with CTO of distal LAD, 80% pRCA, 85% mLCx/90% dLCx, 90% OM3.  - 1/19 staged PCI of proximal RCA.  - Admitted with unstable angina 7/19.  LHC: 75% stenosis proximal-mid LCx, 60% OM1, total occlusion of the distal LAD, patent mid LAD stent, patent RCA stents, 95% stenosis in small PLV branch (likely culprit).  8. Chronic systolic CHF: Ischemic cardiomyopathy.  - Echo (12/18): EF 30-35%.  - Echo (3/19): EF 30-35%, WMAs in LAD territory, RV mildly dilated.   Social History   Socioeconomic History  . Marital status: Widowed    Spouse name: Not on file  . Number  of children: Not on file  . Years of education: Not on file  . Highest education level: Not on file  Occupational History  . Not on file  Social Needs  . Financial resource strain: Not on file  . Food insecurity:    Worry: Not on file    Inability: Not on file  . Transportation needs:    Medical: Not on file    Non-medical: Not on file  Tobacco Use  . Smoking status: Never Smoker  . Smokeless tobacco: Never Used  Substance and Sexual Activity  . Alcohol use: Never    Frequency: Never  . Drug use: Never  . Sexual activity: Yes  Lifestyle  . Physical activity:    Days per week: Not on file    Minutes per session: Not on file  . Stress: Not on file  Relationships  . Social connections:    Talks on phone: Not on file    Gets together: Not on file    Attends religious service: Not on file    Active member of club or organization: Not on file    Attends meetings of clubs or organizations: Not on file    Relationship status: Not on file  . Intimate partner violence:    Fear of current or ex partner: Not on file  Emotionally abused: Not on file    Physically abused: Not on file    Forced sexual activity: Not on file  Other Topics Concern  . Not on file  Social History Narrative  . Not on file   Family History  Problem Relation Age of Onset  . Hypertension Mother   . Heart disease Mother   . Diabetes Mother   . Stroke Mother    ROS: All systems reviewed and negative except as per HPI.   Current Outpatient Medications  Medication Sig Dispense Refill  . aspirin 81 MG chewable tablet Chew 1 tablet (81 mg total) by mouth daily.    Marland Kitchen atorvastatin (LIPITOR) 80 MG tablet Take 1 tablet (80 mg total) by mouth daily at 6 PM. 90 tablet 0  . carvedilol (COREG) 12.5 MG tablet Take 1 tablet (12.5 mg total) by mouth 2 (two) times daily with a meal. 60 tablet 3  . ELDERBERRY PO Take by mouth daily.    . ferrous sulfate 300 (60 Fe) MG/5ML syrup Take 300 mg by mouth daily with  breakfast.    . gabapentin (NEURONTIN) 300 MG capsule Take 600 mg by mouth at bedtime.    . insulin aspart (NOVOLOG FLEXPEN) 100 UNIT/ML FlexPen Inject 2-10 Units into the skin See admin instructions. Inject 2-10 units into the skin three times a day before meals as needed for elevated BGL, per sliding scale    . insulin detemir (LEVEMIR) 100 UNIT/ML injection Inject 0.25 mLs (25 Units total) into the skin 2 (two) times daily. Patient uses sliding scale at home per primary care provider. (Patient taking differently: Inject 25-50 Units into the skin See admin instructions. Inject 25-50 units into the skin two times a day before meals, per sliding scale)    . isosorbide mononitrate (IMDUR) 60 MG 24 hr tablet Take 1 tablet (60 mg total) by mouth daily. 90 tablet 4  . Multiple Vitamin (MULTIVITAMIN WITH MINERALS) TABS tablet Take 1 tablet by mouth daily.    . nitroGLYCERIN (NITROSTAT) 0.4 MG SL tablet DISSOLVE 1 TAB UNDER TONGUE FOR CHEST PAIN - IF PAIN REMAINS AFTER 5 MIN, CALL 911 AND REPEAT DOSE. MAX 3 TABS IN 15 MINUTES 25 tablet 1  . pantoprazole (PROTONIX) 40 MG tablet TAKE ONE TABLET BY MOUTH TWICE A DAY 180 tablet 1  . potassium chloride SA (K-DUR,KLOR-CON) 20 MEQ tablet TAKE ONE TABLET BY MOUTH DAILY *CHANGE IN DOSE* 90 tablet 0  . sacubitril-valsartan (ENTRESTO) 49-51 MG Take 1 tablet by mouth 2 (two) times daily. 60 tablet 6  . Saline 0.2 % SOLN Place 1 spray into both nostrils as needed (congestion).     Marland Kitchen spironolactone (ALDACTONE) 25 MG tablet Take 1 tablet (25 mg total) by mouth every evening. 30 tablet 2  . ticagrelor (BRILINTA) 60 MG TABS tablet Take 1 tablet (60 mg total) by mouth 2 (two) times daily. 60 tablet 6  . tiZANidine (ZANAFLEX) 4 MG tablet Take 4 mg by mouth at bedtime.      No current facility-administered medications for this encounter.    BP 130/78   Pulse 76   Wt 106.7 kg (235 lb 3.2 oz)   SpO2 98%   BMI 35.76 kg/m  General: NAD Neck: No JVD, no thyromegaly or  thyroid nodule.  Lungs: Clear to auscultation bilaterally with normal respiratory effort. CV: Nondisplaced PMI.  Heart regular S1/S2, no S3/S4, no murmur.  No peripheral edema.  No carotid bruit.  Normal pedal pulses.  Abdomen: Soft, nontender,  no hepatosplenomegaly, no distention.  Skin: Intact without lesions or rashes.  Neurologic: Alert and oriented x 3.  Psych: Normal affect. Extremities: No clubbing or cyanosis.  HEENT: Normal.   Assessment/Plan: 1. CAD: Anterior MI in 12/18 with DES to proximal LAD (culprit) followed by staged DES to proximal RCA.  7/19 admission with unstable angina, likely culprit was 95% stenosis in small PLV.  This was managed conservatively.  She has had no recent chest pain.    - Continue Imdur 60 mg daily.  - Continue atorvastatin, check lipids today.    - Continue ASA 81 daily and ticagrelor 60 mg bid.  2. Chronic systolic CHF: Ischemic cardiomyopathy. EF has been stably low in the 30-35% range.  NYHA class II symptoms.  She is not volume overloaded on exam.  She saw Dr. Rayann Heman and decided against ICD.  Narrow QRS, so not CRT candidate.  - Increase Coreg to 9.375 mg qam/12.5 mg qpm x 5 days then 12.5 mg bid.  Will try to ease dose up gently to see if she can tolerate.    - Continue Entresto 49/51 bid  - Can continue prn use of Lasix.  - Increase spironolactone to 25 mg daily.    - BMET today and again in 10 days.   - Repeat echo at followup in 3 months.  3. CKD: stage 3 . Check BMET today.  4. Fe deficiency anemia: Long-standing.  She has had IV Fe infusions.   Followup in 3 months with echo.   Loralie Champagne 05/17/2018

## 2018-06-01 ENCOUNTER — Telehealth (HOSPITAL_COMMUNITY): Payer: Self-pay | Admitting: Cardiology

## 2018-06-01 NOTE — Telephone Encounter (Signed)
Abnormal labs received from [facility] drawn 05/30/18 K 4.8 Cr 1.68 BUN 29 Na 142  Per [facility] for patient to stop potassium   LMOM

## 2018-06-02 ENCOUNTER — Other Ambulatory Visit (HOSPITAL_COMMUNITY): Payer: Self-pay | Admitting: Cardiology

## 2018-06-02 NOTE — Telephone Encounter (Signed)
Patient aware and voiced understanding

## 2018-06-20 ENCOUNTER — Other Ambulatory Visit (HOSPITAL_COMMUNITY): Payer: Self-pay | Admitting: Cardiology

## 2018-07-03 ENCOUNTER — Other Ambulatory Visit: Payer: Self-pay | Admitting: Cardiology

## 2018-07-03 ENCOUNTER — Other Ambulatory Visit: Payer: Self-pay | Admitting: Adult Health

## 2018-07-03 ENCOUNTER — Other Ambulatory Visit (HOSPITAL_COMMUNITY): Payer: Self-pay | Admitting: Cardiology

## 2018-07-03 NOTE — Telephone Encounter (Signed)
atorvastatin 80 mg refilled.

## 2018-07-03 NOTE — Telephone Encounter (Signed)
Pantoprazole 40 mg refilled.

## 2018-07-14 LAB — BASIC METABOLIC PANEL
BUN: 17 (ref 4–21)
Creatinine: 1.7 — AB (ref 0.5–1.1)

## 2018-07-14 LAB — LIPID PANEL
Cholesterol: 126 (ref 0–200)
HDL: 34 — AB (ref 35–70)
LDL Cholesterol: 73
Triglycerides: 94 (ref 40–160)

## 2018-07-14 LAB — HEMOGLOBIN A1C: Hemoglobin A1C: 8.5

## 2018-08-18 ENCOUNTER — Ambulatory Visit (HOSPITAL_COMMUNITY): Admission: RE | Admit: 2018-08-18 | Payer: Medicare Other | Source: Ambulatory Visit | Admitting: Cardiology

## 2018-08-18 ENCOUNTER — Ambulatory Visit (HOSPITAL_COMMUNITY): Payer: Medicare Other

## 2018-08-18 ENCOUNTER — Other Ambulatory Visit: Payer: Self-pay

## 2018-09-01 ENCOUNTER — Ambulatory Visit (HOSPITAL_COMMUNITY)
Admission: RE | Admit: 2018-09-01 | Discharge: 2018-09-01 | Disposition: A | Discharge status: Home / Self Care | Payer: Medicare Other | Source: Ambulatory Visit | Attending: Cardiology | Admitting: Cardiology

## 2018-09-01 ENCOUNTER — Other Ambulatory Visit: Payer: Self-pay

## 2018-09-01 DIAGNOSIS — I5042 Chronic combined systolic (congestive) and diastolic (congestive) heart failure: Secondary | ICD-10-CM | POA: Insufficient documentation

## 2018-09-01 MED ORDER — PERFLUTREN LIPID MICROSPHERE
1.0000 mL | INTRAVENOUS | Status: AC | PRN
  Administered 2018-09-01: 2 mL via INTRAVENOUS
  Filled 2018-09-01: qty 10

## 2018-09-01 NOTE — Progress Notes (Signed)
Echocardiogram 2D Echocardiogram has been performed.  Oneal Deputy Altie Savard 09/01/2018, 3:15 PM

## 2018-09-04 ENCOUNTER — Telehealth (HOSPITAL_COMMUNITY): Payer: Self-pay

## 2018-09-04 NOTE — Telephone Encounter (Signed)
-----   Message from [name], MD sent at 09/02/2018  9:50 PM EDT ----- EF remains low at 30-35%.  She decided against ICD but EF still remains within the range where ICD would be advisable.

## 2018-09-04 NOTE — Telephone Encounter (Signed)
Spoke with patient about echo results. Pt advised would benefit from having more in depth conversation about what results mean going forward as she maintains not wanting an ICD.  Pt amenable. Forwarded to scheduler to arrange office visit. (pt missed 6/5 appt)

## 2018-09-05 ENCOUNTER — Telehealth: Payer: Self-pay | Admitting: "Endocrinology

## 2018-09-05 NOTE — Telephone Encounter (Signed)
Called patient to schedule appt with Dr Dorris Fetch for referral from [facility].  She said she is not aware of referral.  She said she would call us back.  08/10/18.  Tried to call several times since 5/28 with no answer.  Sending referral back to PCP with notice patient did not want appt.

## 2018-09-06 ENCOUNTER — Telehealth (HOSPITAL_COMMUNITY): Payer: Medicare Other | Admitting: Cardiology

## 2018-09-07 ENCOUNTER — Other Ambulatory Visit: Payer: Self-pay

## 2018-09-07 ENCOUNTER — Ambulatory Visit (HOSPITAL_COMMUNITY)
Admission: RE | Admit: 2018-09-07 | Discharge: 2018-09-07 | Disposition: A | Discharge status: Home / Self Care | Payer: Medicare Other | Source: Ambulatory Visit | Attending: Cardiology | Admitting: Cardiology

## 2018-09-07 ENCOUNTER — Encounter (HOSPITAL_COMMUNITY): Payer: Self-pay

## 2018-09-07 DIAGNOSIS — I5022 Chronic systolic (congestive) heart failure: Secondary | ICD-10-CM | POA: Diagnosis not present

## 2018-09-07 MED ORDER — ISOSORB DINITRATE-HYDRALAZINE 20-37.5 MG PO TABS: 0.5000 | ORAL_TABLET | Freq: Three times a day (TID) | ORAL | 6 refills | Status: DC

## 2018-09-07 NOTE — Patient Instructions (Addendum)
STOP IMDUR  START Bidil 1/2 tab three times a day  Your physician recommends that you schedule a follow-up appointment in: 3 months with Dr. [name]  As you will be seeing your endocrinologist, if you need additional diabetes medicine, recommend Farxiga or Jardiance.  These medicines will help with your diabetes as well as help strengthen your heart.

## 2018-09-07 NOTE — Progress Notes (Signed)
Heart Failure TeleHealth Note  Due to national recommendations of social distancing due to [place] 19, Audio/video telehealth visit is felt to be most appropriate for this patient at this time.  See MyChart message from today for patient consent regarding telehealth for [facility].  Date:  09/07/2018   ID:  Stephanie Manning, DOB [birth year], MRN [id number]  Location: Home  Provider location: [facility] Type of Visit: Established patient  PCP:  [name], MD  Cardiologist: Dr. [name]  Chief Complaint: Chest pain.    History of Present Illness: Stephanie Manning is a 58 y.o. female who presents via audio/video conferencing for a telehealth visit today.     she denies symptoms worrisome for COVID 19.   Patient has a history of CAD and ischemic cardiomyopathy.  In 12/18, she had anterior MI with DES to totally occluded mid LAD (culprit). She returned in 1/19 for staged DES to 80% RCA stenosis. Echo in 12/18 showed EF 30-35%.  Repeat echo in 3/19 showed EF stable at 30-35%.    She was admitted in 7/19 with chest pain, concern for unstable angina.  Culprit of symptoms was thought to be 95% stenosis in a small PLV vessel, medical treatment planned.   She saw Dr. Rayann Heman and decided against ICD.   Echo was done in 6/20 showing stable EF 30-35%.   Since last appointment, she has been doing well.  She tolerated increase in Coreg.  She has been working a lot in her garden and is able to carry mulch, etc, without problems.  No significant exertional dyspnea.  She will rarely get very slight chest pain while walking.  Overall, exercise tolerance is much better with less dyspnea.      Labs (2/19): K 4.7, creatinine 1.48, hgb 8.4 Labs (5/19): LDL 65, K 4.7, creatinine 1.66 Labs (8/19): K 5, creatinine 1.78  Labs (9/19): K 3.8, creatinine 1.6 Labs (12/19): K 3.9, creatinine 1.59 Labs (3/20): LDL 73, HDL 41 Labs (5/20): K 4.1, creatinine 1.59  PMH:  1. Type II diabetes 2. CKD stage 3.  3. Hyperlipidemia 4. HTN 5. Fe deficiency anemia 6. GERD 7. CAD: Anterior MI in 12/18 with DES to 100% stenosed mid LAD (culprit), also with CTO of distal LAD, 80% pRCA, 85% mLCx/90% dLCx, 90% OM3.  - 1/19 staged PCI of proximal RCA.  - Admitted with unstable angina 7/19.  LHC: 75% stenosis proximal-mid LCx, 60% OM1, total occlusion of the distal LAD, patent mid LAD stent, patent RCA stents, 95% stenosis in small PLV branch (likely culprit).  8. Chronic systolic CHF: Ischemic cardiomyopathy.  - Echo (12/18): EF 30-35%.  - Echo (3/19): EF 30-35%, WMAs in LAD territory, RV mildly dilated.  - Echo (6/20): EF 30-35%, mild LV dilation, normal RV size and systolic function.   Current Outpatient Medications  Medication Sig Dispense Refill  . aspirin 81 MG chewable tablet Chew 1 tablet (81 mg total) by mouth daily.    Marland Kitchen atorvastatin (LIPITOR) 80 MG tablet TAKE ONE TABLET BY MOUTH DAILY AT 6PM 90 tablet 0  . BRILINTA 60 MG TABS tablet TAKE ONE TABLET BY MOUTH TWICE A DAY 120 tablet 5  . carvedilol (COREG) 12.5 MG tablet Take 1 tablet (12.5 mg total) by mouth 2 (two) times daily with a meal. 60 tablet 3  . ELDERBERRY PO Take by mouth daily.    . ferrous sulfate 300 (60 Fe) MG/5ML syrup Take 300 mg by mouth daily with  breakfast.    . gabapentin (NEURONTIN) 300 MG capsule Take 600 mg by mouth at bedtime.    . insulin aspart (NOVOLOG FLEXPEN) 100 UNIT/ML FlexPen Inject 2-10 Units into the skin See admin instructions. Inject 2-10 units into the skin three times a day before meals as needed for elevated BGL, per sliding scale    . insulin detemir (LEVEMIR) 100 UNIT/ML injection Inject 0.25 mLs (25 Units total) into the skin 2 (two) times daily. Patient uses sliding scale at home per primary care provider. (Patient taking differently: Inject 25-50 Units into the skin See admin instructions. Inject 25-50 units into the skin two times a day before meals, per sliding scale)     . isosorbide-hydrALAZINE (BIDIL) 20-37.5 MG tablet Take 0.5 tablets by mouth 3 (three) times daily. 45 tablet 6  . Multiple Vitamin (MULTIVITAMIN WITH MINERALS) TABS tablet Take 1 tablet by mouth daily.    . nitroGLYCERIN (NITROSTAT) 0.4 MG SL tablet DISSOLVE 1 TAB UNDER TONGUE FOR CHEST PAIN - IF PAIN REMAINS AFTER 5 MIN, CALL 911 AND REPEAT DOSE. MAX 3 TABS IN 15 MINUTES 25 tablet 1  . pantoprazole (PROTONIX) 40 MG tablet TAKE ONE TABLET BY MOUTH TWICE A DAY 180 tablet 0  . sacubitril-valsartan (ENTRESTO) 49-51 MG Take 1 tablet by mouth 2 (two) times daily. 60 tablet 6  . Saline 0.2 % SOLN Place 1 spray into both nostrils as needed (congestion).     Marland Kitchen spironolactone (ALDACTONE) 25 MG tablet Take 1 tablet (25 mg total) by mouth every evening. 30 tablet 2  . tiZANidine (ZANAFLEX) 4 MG tablet Take 4 mg by mouth at bedtime.      No current facility-administered medications for this encounter.     Allergies:   Amoxicillin, Hydrocodone, Lisinopril, Meloxicam, Strawberry extract, Tape, and Toradol [ketorolac tromethamine]   Social History:  The patient  reports that she has never smoked. She has never used smokeless tobacco. She reports that she does not drink alcohol or use drugs.   Family History:  The patient's family history includes Diabetes in her mother; Heart disease in her mother; Hypertension in her mother; Stroke in her mother.   ROS:  Please see the history of present illness.   All other systems are personally reviewed and negative.   Exam:  (Video/Tele Health Call; Exam is subjective and or/visual.) BP 120/63 General:  Speaks in full sentences. No resp difficulty. Neck: No JVD.  Lungs: Normal respiratory effort with conversation.  Abdomen: Non-distended per patient report Extremities: Pt denies edema. Neuro: Alert & oriented x 3.   Recent Labs: 09/27/2017: Magnesium 2.0 10/12/2017: Hemoglobin 9.8; Platelets 175 05/16/2018: BUN 26; Creatinine, Ser 1.62; Potassium 4.1; Sodium  140  Personally reviewed   Wt Readings from Last 3 Encounters:  05/16/18 106.7 kg (235 lb 3.2 oz)  04/28/18 105.2 kg (232 lb)  02/14/18 105.3 kg (232 lb 3.2 oz)      ASSESSMENT AND PLAN:  1. CAD: Anterior MI in 12/18 with DES to proximal LAD (culprit) followed by staged DES to proximal RCA.  7/19 admission with unstable angina, likely culprit was 95% stenosis in small PLV.  This was managed conservatively.  She has rare mild chest pain.    - Stop Imdur as I will be starting Bidil (see below).  - Continue atorvastatin, good lipids in 3/20.     - Continue ASA 81 daily and ticagrelor 60 mg bid.  2. Chronic systolic CHF: Ischemic cardiomyopathy. EF has been stably low in the 30-35%  range (no change on 6/20 echo).  NYHA class I-II symptoms.  She is not volume overloaded.  She saw Dr. Rayann Heman and decided against ICD.  Narrow QRS, so not CRT candidate.  - Continue Coreg 12.5 mg bid.     - Continue Entresto 49/51 bid  - Can continue prn use of Lasix.  - Continue spironolactone 25 mg daily.    - Stop Imdur and start Bidil 1/2 tab tid.   - We discussed ICD again, she is not interested at this time but understands the rationale behind getting one.  3. CKD: stage 3. BMET in 5/20 was stable.   4. Fe deficiency anemia: Long-standing.  She has had IV Fe infusions.  5. Type II DM: Control has been worse recently and she is going to see an endocrinologist.  - If she needs an additional med for diabetes, would use dapagliflozin or empagliflozin.   COVID screen The patient does not have any symptoms that suggest any further testing/ screening at this time.  Social distancing reinforced today.  Patient Risk: After full review of this patients clinical status, I feel that they are at moderate risk for cardiac decompensation at this time.  Relevant cardiac medications were reviewed at length with the patient today. The patient does not have concerns regarding their medications at this time.   Recommended  follow-up:  3 months in the office.   Today, I have spent 18 minutes with the patient with telehealth technology discussing the above issues .    Signed, Loralie Champagne, MD  09/07/2018  [place] [address] [facility] [place] [address] [phone] (office) [phone] (fax)

## 2018-09-07 NOTE — Progress Notes (Signed)
AVS below reviewed:  STOP IMDUR  START Bidil 1/2 tab three times a day  Your physician recommends that you schedule a follow-up appointment in: 3 months with Dr. [name]  As you will be seeing your endocrinologist, if you need additional diabetes medicine, recommend Farxiga or Jardiance.  These medicines will help with your diabetes as well as help strengthen your heart.   *Pt verbalized understanding. All information provided on my chart at request of patient. No questions endorsed.

## 2018-09-21 ENCOUNTER — Other Ambulatory Visit (HOSPITAL_COMMUNITY): Payer: Self-pay | Admitting: Cardiology

## 2018-09-22 ENCOUNTER — Other Ambulatory Visit: Payer: Self-pay | Admitting: Cardiology

## 2018-09-22 ENCOUNTER — Other Ambulatory Visit: Payer: Self-pay | Admitting: Adult Health

## 2018-10-05 ENCOUNTER — Other Ambulatory Visit: Payer: Self-pay | Admitting: Adult Health

## 2018-10-05 ENCOUNTER — Other Ambulatory Visit: Payer: Self-pay | Admitting: Cardiology

## 2018-10-06 ENCOUNTER — Other Ambulatory Visit (HOSPITAL_COMMUNITY): Payer: Self-pay | Admitting: Cardiology

## 2018-10-06 NOTE — Telephone Encounter (Signed)
Rx(s) sent to pharmacy electronically.  

## 2018-10-16 ENCOUNTER — Telehealth (HOSPITAL_COMMUNITY): Payer: Self-pay | Admitting: *Deleted

## 2018-10-16 NOTE — Telephone Encounter (Signed)
Pt aware and agreeable.  

## 2018-10-16 NOTE — Telephone Encounter (Signed)
Pt left VM to report she can not tolerate bidil. Pt stated she passed out and was unconscious for 6 hours. I called pt to get more information no answer.    Routed to [place]

## 2018-10-16 NOTE — Telephone Encounter (Signed)
She needs to stay off the Bidil.

## 2018-10-16 NOTE — Addendum Note (Signed)
Addended by: Harvie Junior on: 10/16/2018 11:13 AM   Modules accepted: Orders

## 2018-10-18 ENCOUNTER — Encounter: Payer: Self-pay | Admitting: "Endocrinology

## 2018-10-18 ENCOUNTER — Ambulatory Visit (INDEPENDENT_AMBULATORY_CARE_PROVIDER_SITE_OTHER): Payer: Medicare Other | Admitting: "Endocrinology

## 2018-10-18 ENCOUNTER — Other Ambulatory Visit: Payer: Self-pay

## 2018-10-18 VITALS — BP 138/78 | HR 63 | Ht 68.0 in | Wt 241.0 lb

## 2018-10-18 DIAGNOSIS — E782 Mixed hyperlipidemia: Secondary | ICD-10-CM | POA: Diagnosis not present

## 2018-10-18 DIAGNOSIS — I10 Essential (primary) hypertension: Secondary | ICD-10-CM

## 2018-10-18 DIAGNOSIS — E1159 Type 2 diabetes mellitus with other circulatory complications: Secondary | ICD-10-CM | POA: Diagnosis not present

## 2018-10-18 DIAGNOSIS — I2510 Atherosclerotic heart disease of native coronary artery without angina pectoris: Secondary | ICD-10-CM

## 2018-10-18 NOTE — Patient Instructions (Signed)

## 2018-10-18 NOTE — Progress Notes (Signed)
Endocrinology Consult Note       10/18/2018, 2:46 PM   Subjective:    Patient ID: Stephanie Manning, female    DOB: [birth year].  Stephanie Manning is being seen in consultation for management of currently uncontrolled symptomatic diabetes requested by  [name], MD.   Past Medical History:  Diagnosis Date  . AKI (acute kidney injury) ([place]) 02/2017  . CAD in native artery    a. NSTEMI 02/2017 s/p DES to mLAD, diffuse disease otherwise, EF 30-35%. b. Staged cath 03/2017 - patent stent in the proximal to mid LAD. CTO of the distal LAD, 90% diffuse small first diagonal, 90% distal LCx/OM, 80% diffuse prox RCA, moderately elevated LVEDP 46mmHg, s/p DES to mRCA, residual disease treated medically (too small for PCI), EF 30-35%.  . Chronic combined systolic and diastolic CHF (congestive heart failure) ([place])   . CKD (chronic kidney disease), stage IV ([place])    [name] 04/21/2017  . Diabetes mellitus, type II, insulin dependent ([place])   . GERD (gastroesophageal reflux disease)   . History of blood transfusion    "related to menses"  . History of stomach ulcers 2017  . Hyperlipidemia   . Hypertension   . Iron deficiency anemia 03/2017   "had to have iron infusions" (10/10/2017)  . Ischemic cardiomyopathy   . STEMI (ST elevation myocardial infarction) ([place]) 02/2017    Past Surgical History:  Procedure Laterality Date  . CESAREAN SECTION  1987  . CORONARY ANGIOPLASTY WITH STENT PLACEMENT  04/01/2017   drug-eluting stent was successfully placed using a STENT SYNERGY DES [id number]  . CORONARY STENT INTERVENTION N/A 04/01/2017   Procedure: CORONARY STENT INTERVENTION;  Surgeon: [name], [name], MD;  Location: [facility];  Service: Cardiovascular;  Laterality: N/A;  . CORONARY/GRAFT ACUTE MI REVASCULARIZATION N/A 03/10/2017   Procedure: Coronary/Graft Acute MI Revascularization;  Surgeon: [name], [name],  MD;  Location: [facility];  Service: Cardiovascular;  Laterality: N/A;  . LEFT HEART CATH AND CORONARY ANGIOGRAPHY N/A 03/10/2017   Procedure: LEFT HEART CATH AND CORONARY ANGIOGRAPHY;  Surgeon: [name], [name], MD;  Location: [facility];  Service: Cardiovascular;  Laterality: N/A;  . LEFT HEART CATH AND CORONARY ANGIOGRAPHY N/A 04/01/2017   Procedure: LEFT HEART CATH AND CORONARY ANGIOGRAPHY;  Surgeon: [name], [name], MD;  Location: [facility];  Service: Cardiovascular;  Laterality: N/A;  . LEFT HEART CATH AND CORONARY ANGIOGRAPHY N/A 10/12/2017   Procedure: LEFT HEART CATH AND CORONARY ANGIOGRAPHY;  Surgeon: Sherren Mocha, MD;  Location: [facility];  Service: Cardiovascular;  Laterality: N/A;  . OOPHORECTOMY     "1 ovary removed"  . THROAT SURGERY  02/2014   "nodule in my throat removed; not related to thyroid"    Social History   Socioeconomic History  . Marital status: Widowed    Spouse name: Not on file  . Number of children: Not on file  . Years of education: Not on file  . Highest education level: Not on file  Occupational History  . Not on file  Social Needs  . Financial resource strain:  Not on file  . Food insecurity    Worry: Not on file    Inability: Not on file  . Transportation needs    Medical: Not on file    Non-medical: Not on file  Tobacco Use  . Smoking status: Never Smoker  . Smokeless tobacco: Never Used  Substance and Sexual Activity  . Alcohol use: Never    Frequency: Never  . Drug use: Never  . Sexual activity: Yes  Lifestyle  . Physical activity    Days per week: Not on file    Minutes per session: Not on file  . Stress: Not on file  Relationships  . Social Herbalist on phone: Not on file    Gets together: Not on file    Attends religious service: Not on file    Active member of club or organization: Not on file    Attends meetings of clubs or organizations: Not on file    Relationship status: Not  on file  Other Topics Concern  . Not on file  Social History Narrative  . Not on file    Family History  Problem Relation Age of Onset  . Hypertension Mother   . Heart disease Mother   . Diabetes Mother   . Stroke Mother     Outpatient Encounter Medications as of 10/18/2018  Medication Sig  . Insulin Detemir (LEVEMIR) 100 UNIT/ML Pen Inject 50 Units into the skin at bedtime.  Marland Kitchen aspirin 81 MG chewable tablet Chew 1 tablet (81 mg total) by mouth daily.  Marland Kitchen atorvastatin (LIPITOR) 80 MG tablet Take 1 tablet (80 mg total) by mouth daily at 6 PM. NEEDS APPOINTMENT WITH DR [name] FOR FUTURE REFILLS  . BRILINTA 60 MG TABS tablet TAKE ONE TABLET BY MOUTH TWICE A DAY  . carvedilol (COREG) 12.5 MG tablet Take 1 tablet (12.5 mg total) by mouth 2 (two) times daily with a meal.  . ELDERBERRY PO Take by mouth daily.  Marland Kitchen ENTRESTO 49-51 MG TAKE ONE TABLET BY MOUTH TWICE A DAY  . ferrous sulfate 300 (60 Fe) MG/5ML syrup Take 300 mg by mouth daily with breakfast.  . gabapentin (NEURONTIN) 300 MG capsule Take 600 mg by mouth at bedtime.  . insulin aspart (NOVOLOG FLEXPEN) 100 UNIT/ML FlexPen Inject 6-12 Units into the skin 3 (three) times daily before meals.  . Multiple Vitamin (MULTIVITAMIN WITH MINERALS) TABS tablet Take 1 tablet by mouth daily.  . nitroGLYCERIN (NITROSTAT) 0.4 MG SL tablet DISSOLVE 1 TAB UNDER TONGUE FOR CHEST PAIN - IF PAIN REMAINS AFTER 5 MIN, CALL 911 AND REPEAT DOSE. MAX 3 TABS IN 15 MINUTES  . pantoprazole (PROTONIX) 40 MG tablet Take 1 tablet (40 mg total) by mouth 2 (two) times daily.  . Saline 0.2 % SOLN Place 1 spray into both nostrils as needed (congestion).   Marland Kitchen spironolactone (ALDACTONE) 25 MG tablet TAKE ONE TABLET BY MOUTH EVERY EVENING  . tiZANidine (ZANAFLEX) 4 MG tablet Take 4 mg by mouth at bedtime.   . [DISCONTINUED] insulin detemir (LEVEMIR) 100 UNIT/ML injection Inject 0.25 mLs (25 Units total) into the skin 2 (two) times daily. Patient uses sliding scale at home per  primary care provider. (Patient taking differently: Inject 25-50 Units into the skin See admin instructions. Inject 25-50 units into the skin two times a day before meals, per sliding scale)   No facility-administered encounter medications on file as of 10/18/2018.     ALLERGIES: Allergies  Allergen Reactions  .  Bidil [Isosorb Dinitrate-Hydralazine]   . Amoxicillin Other (See Comments)    Yeast infection during treatment  . Hydrocodone Palpitations, Rash and Other (See Comments)    GI upset, also  . Lisinopril Palpitations and Other (See Comments)    Chest pain, too  . Meloxicam Nausea Only  . Strawberry Extract Hives    Does not always happen  . Tape Rash    Some tapes cause rashes  . Toradol [Ketorolac Tromethamine] Palpitations    VACCINATION STATUS:  There is no immunization history on file for this patient.  Diabetes She presents for her initial diabetic visit. She has type 2 diabetes mellitus. Onset time: She was diagnosed at approximate age of 58 years. Her disease course has been worsening. There are no hypoglycemic associated symptoms. Pertinent negatives for hypoglycemia include no confusion, headaches, pallor or seizures. Associated symptoms include fatigue, polydipsia and polyuria. Pertinent negatives for diabetes include no chest pain and no polyphagia. There are no hypoglycemic complications. Symptoms are worsening. Diabetic complications include heart disease, nephropathy and retinopathy. Risk factors for coronary artery disease include diabetes mellitus, dyslipidemia, hypertension, obesity, sedentary lifestyle and post-menopausal. Current diabetic treatments: She is currently on Levemir 50 units nightly, 25 units in the morning.  She is also on NovoLog 2-20 units 3 times daily. Her weight is increasing steadily. She is following a generally unhealthy diet. When asked about meal planning, she reported none. She has not had a previous visit with a dietitian. She never  participates in exercise. Her home blood glucose trend is fluctuating minimally. (She did not bring any logs nor meter to review.  Her most recent A1c was 8.5% in May 2020.  She reports some occasional hypoglycemic episodes.) An ACE inhibitor/angiotensin II receptor blocker is not being taken. Eye exam is current.  Hyperlipidemia This is a chronic problem. The current episode started more than 1 year ago. The problem is uncontrolled. Pertinent negatives include no chest pain, myalgias or shortness of breath. Risk factors for coronary artery disease include diabetes mellitus, dyslipidemia, hypertension, obesity, post-menopausal and family history.  Hypertension This is a chronic problem. The current episode started more than 1 year ago. The problem is controlled. Pertinent negatives include no chest pain, headaches, palpitations or shortness of breath. Risk factors for coronary artery disease include diabetes mellitus, dyslipidemia, obesity, sedentary lifestyle and post-menopausal state. Past treatments include beta blockers. Hypertensive end-organ damage includes kidney disease, CAD/MI and retinopathy.     Review of Systems  Constitutional: Positive for fatigue. Negative for chills, fever and unexpected weight change.  HENT: Negative for trouble swallowing and voice change.   Eyes: Negative for visual disturbance.  Respiratory: Negative for cough, shortness of breath and wheezing.   Cardiovascular: Negative for chest pain, palpitations and leg swelling.  Gastrointestinal: Negative for diarrhea, nausea and vomiting.  Endocrine: Positive for polydipsia and polyuria. Negative for cold intolerance, heat intolerance and polyphagia.  Musculoskeletal: Negative for arthralgias and myalgias.  Skin: Negative for color change, pallor, rash and wound.  Neurological: Negative for seizures and headaches.  Psychiatric/Behavioral: Negative for confusion and suicidal ideas.    Objective:    BP 138/78   Pulse  63   Ht 5\' 8"  (1.727 m)   Wt 241 lb (109.3 kg)   BMI 36.64 kg/m   Wt Readings from Last 3 Encounters:  10/18/18 241 lb (109.3 kg)  05/16/18 235 lb 3.2 oz (106.7 kg)  04/28/18 232 lb (105.2 kg)     Physical Exam Constitutional:  Appearance: She is well-developed.  HENT:     Head: Normocephalic and atraumatic.  Neck:     Musculoskeletal: Normal range of motion and neck supple.     Thyroid: No thyromegaly.     Trachea: No tracheal deviation.  Pulmonary:     Effort: Pulmonary effort is normal.  Abdominal:     Tenderness: There is no abdominal tenderness. There is no guarding.  Musculoskeletal: Normal range of motion.  Skin:    General: Skin is warm and dry.     Coloration: Skin is not pale.     Findings: No erythema or rash.  Neurological:     Mental Status: She is alert and oriented to person, place, and time.     Cranial Nerves: No cranial nerve deficit.     Coordination: Coordination normal.     Deep Tendon Reflexes: Reflexes are normal and symmetric.  Psychiatric:        Mood and Affect: Mood normal.        Judgment: Judgment normal.     CMP ( most recent) CMP     Component Value Date/Time   NA 140 05/16/2018 1412   NA 142 10/19/2017 1600   K 4.1 05/16/2018 1412   CL 110 05/16/2018 1412   CO2 22 05/16/2018 1412   GLUCOSE 126 (H) 05/16/2018 1412   BUN 17 07/14/2018   CREATININE 1.7 (A) 07/14/2018   CREATININE 1.62 (H) 05/16/2018 1412   CALCIUM 8.8 (L) 05/16/2018 1412   PROT 6.3 (L) 03/15/2017 0253   ALBUMIN 2.6 (L) 03/15/2017 0253   AST 30 03/15/2017 0253   ALT 22 03/15/2017 0253   ALKPHOS 59 03/15/2017 0253   BILITOT 0.6 03/15/2017 0253   GFRNONAA 35 (L) 05/16/2018 1412   GFRAA 40 (L) 05/16/2018 1412     Diabetic Labs (most recent): Lab Results  Component Value Date   HGBA1C 8.5 07/14/2018   HGBA1C 8.6 (H) 03/10/2017     Lipid Panel ( most recent) Lipid Panel     Component Value Date/Time   CHOL 126 07/14/2018   TRIG 94 07/14/2018    HDL 34 (A) 07/14/2018   CHOLHDL 3.1 05/16/2018 1412   VLDL 13 05/16/2018 1412   LDLCALC 73 07/14/2018        Assessment & Plan:   1. DM type 2 causing vascular disease    - Stephanie Manning has currently uncontrolled symptomatic type 2 DM since  58 years of age,  with most recent A1c of 8.5 %. Recent labs reviewed. - I had a long discussion with her about the progressive nature of diabetes and the pathology behind its complications. -her diabetes is complicated by retinopathy, nephropathy, coronary artery disease and she remains at a high risk for more acute and chronic complications which include CAD, CVA, CKD, retinopathy, and neuropathy. These are all discussed in detail with her.  - I have counseled her on diet management and weight loss, by adopting a carbohydrate restricted/protein rich diet. - she admits that there is a room for improvement in her food and drink choices. - Suggestion is made for her to avoid simple carbohydrates  from her diet including Cakes, Sweet Desserts, Ice Cream, Soda (diet and regular), Sweet Tea, Candies, Chips, Cookies, Store Bought Juices, Alcohol in Excess of  1-2 drinks a day, Artificial Sweeteners,  Coffee Creamer, and "Sugar-free" Products. This will help patient to have more stable blood glucose profile and potentially avoid unintended weight gain.  - I encouraged her to switch  to  unprocessed or minimally processed complex starch and increased protein intake (animal or plant source), fruits, and vegetables.  - she is advised to stick to a routine mealtimes to eat 3 meals  a day and avoid unnecessary snacks ( to snack only to correct hypoglycemia).   - she will be scheduled with Jearld Fenton, RDN, CDE for diabetes education.  - I have approached her with the following individualized plan to manage  her diabetes and patient agrees:   - she will continue to need intensive treatment with basal/bolus insulin in order for her to achieve and  maintain control of diabetes to target.  -However, she would benefit from simplified treatment regimen.  I discussed and adjusted her Levemir to 50 units nightly, NovoLog to 6 units 3 times a day with meals  for pre-meal BG readings of 90-150mg /dl, plus patient specific correction dose for unexpected hyperglycemia above 150mg /dl, associated with strict monitoring of glucose 4 times a day-before meals and at bedtime. - she is warned not to take insulin without proper monitoring per orders. - Adjustment parameters are given to her for hypo and hyperglycemia in writing. - she is encouraged to call clinic for blood glucose levels less than 70 or above 300 mg /dl.  - she is not a candidate for metformin, SGLT2 inhibitors due to concurrent renal insufficiency.  - she will be considered for incretin therapy as appropriate next visit.  - Patient specific target  A1c;  LDL, HDL, Triglycerides, and  Waist Circumference were discussed in detail.  2) Blood Pressure /Hypertension:  her blood pressure is  controlled to target.   she is advised to continue her current medications including carvedilol 12.5 mg p.o. daily with breakfast . 3) Lipids/Hyperlipidemia:   Review of her recent lipid panel showed  controlled  LDL at 73.  she  is advised to continue    atorvastatin 80 mg daily at bedtime.  Side effects and precautions discussed with her.  4)  Weight/Diet:  Body mass index is 36.64 kg/m.  -   clearly complicating her diabetes care.  I discussed with her the fact that loss of 5 - 10% of her  current body weight will have the most impact on her diabetes management.  CDE Consult will be initiated . Exercise, and detailed carbohydrates information provided  -  detailed on discharge instructions.  5) Chronic Care/Health Maintenance:  -she  is Statin medications and  is encouraged to initiate and continue to follow up with Ophthalmology, Dentist,  Podiatrist at least yearly or according to recommendations, and  advised to   stay away from smoking. I have recommended yearly flu vaccine and pneumonia vaccine at least every 5 years; moderate intensity exercise for up to 150 minutes weekly; and  sleep for at least 7 hours a day.  - she is  advised to maintain close follow up with Margo Common, Jarvis Newcomer, MD for primary care needs, as well as her other providers for optimal and coordinated care.  - Time spent with the patient: 45 minutes, of which >50% was spent in obtaining information about her symptoms, reviewing her previous labs/studies, evaluations, and treatments, counseling her about her currently uncontrolled, complicated type 2 diabetes; hyperlipidemia, hypertension, and developing plans for long term treatment based on the latest standards of care/guidelines.  Please refer to " Patient Self Inventory" in the Media  tab for reviewed elements of pertinent patient history.  Stephanie Manning participated in the discussions, expressed understanding, and voiced agreement with  the above plans.  All questions were answered to her satisfaction. she is encouraged to contact clinic should she have any questions or concerns prior to her return visit.  Follow up plan: - Return in about 5 weeks (around 11/22/2018) for Follow up with Pre-visit Labs, Meter, and Logs.  Glade Lloyd, MD [facility] [address] [place], [place] [address] Phone: [phone]  Fax: [phone]    10/18/2018, 2:46 PM  This note was partially dictated with voice recognition software. Similar sounding words can be transcribed inadequately or may not  be corrected upon review.

## 2018-10-21 ENCOUNTER — Other Ambulatory Visit: Payer: Self-pay | Admitting: Adult Health

## 2018-10-21 ENCOUNTER — Other Ambulatory Visit (HOSPITAL_COMMUNITY): Payer: Self-pay | Admitting: Cardiology

## 2018-10-23 ENCOUNTER — Other Ambulatory Visit (HOSPITAL_COMMUNITY): Payer: Self-pay

## 2018-10-23 MED ORDER — CARVEDILOL 12.5 MG PO TABS: 12.5000 mg | ORAL_TABLET | Freq: Two times a day (BID) | ORAL | 3 refills | Status: DC

## 2018-10-24 NOTE — Telephone Encounter (Signed)
Pt due for 12 month f/u. °Please contact pt for future appointment. °Pt needing refills. °

## 2018-10-25 ENCOUNTER — Other Ambulatory Visit: Payer: Self-pay

## 2018-10-25 ENCOUNTER — Telehealth: Payer: Self-pay | Admitting: "Endocrinology

## 2018-10-25 NOTE — Telephone Encounter (Signed)
Patient needs test strips for accu check machine. She is out. [facility] in [place].

## 2018-10-25 NOTE — Telephone Encounter (Signed)
Refill request sent from [facility] and filled

## 2018-11-09 ENCOUNTER — Other Ambulatory Visit: Payer: Self-pay

## 2018-11-09 ENCOUNTER — Telehealth: Payer: Self-pay | Admitting: "Endocrinology

## 2018-11-09 ENCOUNTER — Encounter: Payer: Medicare Other | Attending: "Endocrinology | Admitting: Nutrition

## 2018-11-09 MED ORDER — BLOOD GLUCOSE METER KIT: PACK | 5 refills | Status: AC

## 2018-11-09 NOTE — Telephone Encounter (Signed)
Rx sent 

## 2018-11-09 NOTE — Progress Notes (Signed)
  Medical Nutrition Therapy:  Appt start time: 1330 end time:  1430.   Assessment:  Primary concerns today: Diabetes Type 2. Obesity. Lives by herself and boyfriend. She she does the cooking and shopping. See Dr. Dorris Fetch, Endocrinology. CKD  Creat 1.6 mg/dl. Unknown eGFR.  A1C 8.5%. FBS 60-119 mg/dl. Doesn't eat meals on schedule. Levemir 50 units and Novolog 6 units with meals and sliding scale. Takes Gabapentin. Wt Readings from Last 3 Encounters:  12/08/18 241 lb 9.6 oz (109.6 kg)  12/06/18 243 lb (110.2 kg)  10/18/18 241 lb (109.3 kg)   Ht Readings from Last 3 Encounters:  12/06/18 5' 8.5" (1.74 m)  10/18/18 _0  (1.727 m)  10/31/17 _1  (1.727 m)   There is no height or weight on file to calculate BMI. _2 @ Facility age limit for growth percentiles is 20 years. Facility age limit for growth percentiles is 20 years.  Lab Results  Component Value Date   HGBA1C 8.5 07/14/2018   CMP Latest Ref Rng & Units 11/27/2018 07/14/2018 05/16/2018  Glucose 65 - 99 mg/dL 75 - 126(H)  BUN 7 - 25 mg/dL 23 17 26(H)  Creatinine 0.50 - 1.05 mg/dL 1.60(H) 1.7(A) 1.62(H)  Sodium 135 - 146 mmol/L 144 - 140  Potassium 3.5 - 5.3 mmol/L 4.2 - 4.1  Chloride 98 - 110 mmol/L 111(H) - 110  CO2 20 - 32 mmol/L 25 - 22  Calcium 8.6 - 10.4 mg/dL 9.2 - 8.8(L)  Total Protein 6.1 - 8.1 g/dL 7.4 - -  Total Bilirubin 0.2 - 1.2 mg/dL 0.3 - -  Alkaline Phos 38 - 126 U/L - - -  AST 10 - 35 U/L 18 - -  ALT 6 - 29 U/L 13 - -    Preferred Learning Style:  No preference indicated   Learning Readiness:   Ready  Change in progress   MEDICATIONS:   DIETARY INTAKE:    Eats 2-3 meals per day. Doesn't eat meals on time. Usually eats brunch and skips breakfast. Doesn't get hungry at times.  Usual physical activity: ADL  Estimated energy needs: 12- calories 135 g carbohydrates 90 g protein 33 g fat  Progress Towards Goal(s):  In progress.   Nutritional Diagnosis:  NB-1.1 Food and  nutrition-related knowledge deficit As related to Diabetes .  As evidenced by A1C.    Intervention: Nutrition and Diabetes education provided on My Plate, CHO counting, meal planning, portion sizes, timing of meals, avoiding snacks between meals unless having a low blood sugar, target ranges for A1C and blood sugars, signs/symptoms and treatment of hyper/hypoglycemia, monitoring blood sugars, taking medications as prescribed, benefits of exercising 30 minutes per day and prevention of complications of DM. Marland KitchenGoals  Follow My Plate  Eat three balanced meals   Do not skip meals  Eat meals on time  Drink water  Walk 15-30 minutes daily. Get A1C down to 7% or less.   Take insulin as prescribed and use sliding scale correctly.  Teaching Method Utilized:  Visual Auditory Hands on  Handouts given during visit include:  The Plate Method    Meal Plan Card  Diabetes Instructions.    Barriers to learning/adherence to lifestyle change: none  Demonstrated degree of understanding via:  Teach Back   Monitoring/Evaluation:  Dietary intake, exercise  and body weight in 1 month(s).

## 2018-11-09 NOTE — Telephone Encounter (Signed)
Patient left VM that her meter is not working. She said she needs a RX sent to [facility] for a new one touch ultra mini and the strips because she is testing 4x a day now and is almost out

## 2018-11-13 ENCOUNTER — Other Ambulatory Visit (HOSPITAL_COMMUNITY): Payer: Self-pay

## 2018-11-13 ENCOUNTER — Other Ambulatory Visit: Payer: Self-pay

## 2018-11-13 MED ORDER — SPIRONOLACTONE 25 MG PO TABS: 25.0000 mg | ORAL_TABLET | Freq: Every evening | ORAL | 0 refills | Status: DC

## 2018-11-14 MED ORDER — PANTOPRAZOLE SODIUM 40 MG PO TBEC: 40.0000 mg | DELAYED_RELEASE_TABLET | Freq: Two times a day (BID) | ORAL | 2 refills | Status: DC

## 2018-11-21 ENCOUNTER — Telehealth: Payer: Self-pay | Admitting: "Endocrinology

## 2018-11-21 ENCOUNTER — Other Ambulatory Visit: Payer: Self-pay | Admitting: Cardiology

## 2018-11-21 NOTE — Telephone Encounter (Signed)
Faxed pt's lab order to [facility]. Patient was unaware she was suppose to do labs for appt on 9/9.

## 2018-11-22 ENCOUNTER — Ambulatory Visit: Payer: Medicare Other | Admitting: "Endocrinology

## 2018-11-29 LAB — COMPLETE METABOLIC PANEL WITH GFR
AG Ratio: 1.2 (calc) (ref 1.0–2.5)
ALT: 13 U/L (ref 6–29)
AST: 18 U/L (ref 10–35)
Albumin: 4 g/dL (ref 3.6–5.1)
Alkaline phosphatase (APISO): 75 U/L (ref 37–153)
BUN/Creatinine Ratio: 14 (calc) (ref 6–22)
BUN: 23 mg/dL (ref 7–25)
CO2: 25 mmol/L (ref 20–32)
Calcium: 9.2 mg/dL (ref 8.6–10.4)
Chloride: 111 mmol/L — ABNORMAL HIGH (ref 98–110)
Creat: 1.6 mg/dL — ABNORMAL HIGH (ref 0.50–1.05)
GFR, Est African American: 40 mL/min/{1.73_m2} — ABNORMAL LOW (ref >=60)
GFR, Est Non African American: 35 mL/min/{1.73_m2} — ABNORMAL LOW (ref >=60)
Globulin: 3.4 g/dL (calc) (ref 1.9–3.7)
Glucose, Bld: 75 mg/dL (ref 65–99)
Potassium: 4.2 mmol/L (ref 3.5–5.3)
Sodium: 144 mmol/L (ref 135–146)
Total Bilirubin: 0.3 mg/dL (ref 0.2–1.2)
Total Protein: 7.4 g/dL (ref 6.1–8.1)

## 2018-11-29 LAB — HEMOGLOBIN A1C
Hgb A1c MFr Bld: 7.6 % of total Hgb — ABNORMAL HIGH (ref <5.7)
Mean Plasma Glucose: 171 (calc)
eAG (mmol/L): 9.5 (calc)

## 2018-11-29 LAB — T4, FREE: Free T4: 1.1 ng/dL (ref 0.8–1.8)

## 2018-11-29 LAB — TSH: TSH: 1.57 mIU/L (ref 0.40–4.50)

## 2018-12-04 ENCOUNTER — Ambulatory Visit: Payer: Medicare Other | Admitting: "Endocrinology

## 2018-12-06 ENCOUNTER — Encounter: Payer: Medicare Other | Attending: "Endocrinology | Admitting: Nutrition

## 2018-12-06 ENCOUNTER — Other Ambulatory Visit: Payer: Self-pay

## 2018-12-06 ENCOUNTER — Encounter: Payer: Self-pay | Admitting: Nutrition

## 2018-12-06 VITALS — Ht 68.5 in | Wt 243.0 lb

## 2018-12-06 DIAGNOSIS — IMO0002 Reserved for concepts with insufficient information to code with codable children: Secondary | ICD-10-CM

## 2018-12-06 DIAGNOSIS — E1165 Type 2 diabetes mellitus with hyperglycemia: Secondary | ICD-10-CM | POA: Insufficient documentation

## 2018-12-06 DIAGNOSIS — I10 Essential (primary) hypertension: Secondary | ICD-10-CM

## 2018-12-06 DIAGNOSIS — E118 Type 2 diabetes mellitus with unspecified complications: Secondary | ICD-10-CM | POA: Insufficient documentation

## 2018-12-06 DIAGNOSIS — I25110 Atherosclerotic heart disease of native coronary artery with unstable angina pectoris: Secondary | ICD-10-CM | POA: Diagnosis present

## 2018-12-06 DIAGNOSIS — E66813 Obesity, class 3: Secondary | ICD-10-CM

## 2018-12-06 MED ORDER — PANTOPRAZOLE SODIUM 40 MG PO TBEC: 40.0000 mg | DELAYED_RELEASE_TABLET | Freq: Two times a day (BID) | ORAL | 2 refills | Status: DC

## 2018-12-06 NOTE — Patient Instructions (Signed)
Goals  Increase walking  Don't skip meal  Eat meals on time.  Eat 2-3 carb choices per meals Lose 1/2-1 lbs per month.

## 2018-12-06 NOTE — Progress Notes (Signed)
  Medical Nutrition Therapy:  Appt start time: 1130 end time:  1215   Assessment:  Primary concerns today: Diabetes Type 2. Obesity. Lives by herself and boyfriend. She she does the cooking and shopping. Had a HA in 2018 Dec 26th.  See Dr. Dorris Fetch, Endocrinology. BS's  60-200's.Levemir 50 units and Novolog 6 units with meals and sliding scale. Takes Gabapentin.  A1C improved from 7.6% down from 8.5%.  Her biggest issue is that she is going too long between meals eating and has some low blood sugars. She doesn't eat meals consistently. Willing to work on exercising more and eating meals on time.   Lab Results  Component Value Date   HGBA1C 7.6 (H) 11/27/2018    Preferred Learning Style:  No preference indicated   Learning Readiness:   Ready  Change in progress   MEDICATIONS:  See list    DIETARY INTAKE:  Eats 2-3 meals per day sporadic. Sometimes skips meals.   Usual physical activity: none  Estimated energy needs: 1500  calories 170 g carbohydrates 112 g protein 42 g fat  Progress Towards Goal(s):  In progress.   Nutritional Diagnosis:  NB-1.1 Food and nutrition-related knowledge deficit As related to Diabetes Type 2.  As evidenced by A1C 7.6%.    Intervention:  Nutrition and Diabetes education provided on My Plate, CHO counting, meal planning, portion sizes, timing of meals, avoiding snacks between meals unless having a low blood sugar, target ranges for A1C and blood sugars, signs/symptoms and treatment of hyper/hypoglycemia, monitoring blood sugars, taking medications as prescribed, benefits of exercising 30 minutes per day and prevention of complications of DM.  Goals  Increase walking  Don't skip meal  Eat meals on time.  Eat 2-3 carb choices per meals Lose 1/2-1 lbs per month.   Teaching Method Utilized:  Visual Auditory Hands on  Handouts given during visit include:  The Plate Method    Barriers to learning/adherence to lifestyle change:  none  Demonstrated degree of understanding via:  Teach Back   Monitoring/Evaluation:  Dietary intake, exercise,  and body weight in 3 month(s).

## 2018-12-07 ENCOUNTER — Encounter: Payer: Self-pay | Admitting: "Endocrinology

## 2018-12-07 ENCOUNTER — Ambulatory Visit (INDEPENDENT_AMBULATORY_CARE_PROVIDER_SITE_OTHER): Payer: Medicare Other | Admitting: "Endocrinology

## 2018-12-07 DIAGNOSIS — E782 Mixed hyperlipidemia: Secondary | ICD-10-CM

## 2018-12-07 DIAGNOSIS — E1159 Type 2 diabetes mellitus with other circulatory complications: Secondary | ICD-10-CM | POA: Diagnosis not present

## 2018-12-07 DIAGNOSIS — I10 Essential (primary) hypertension: Secondary | ICD-10-CM | POA: Diagnosis not present

## 2018-12-07 DIAGNOSIS — I25110 Atherosclerotic heart disease of native coronary artery with unstable angina pectoris: Secondary | ICD-10-CM | POA: Diagnosis not present

## 2018-12-07 MED ORDER — ACCU-CHEK GUIDE VI STRP: ORAL_STRIP | 2 refills | Status: DC

## 2018-12-07 MED ORDER — ACCU-CHEK SOFTCLIX LANCETS MISC: 2 refills | Status: DC

## 2018-12-07 NOTE — Progress Notes (Signed)
12/07/2018, 6:13 PM                                                    Endocrinology Telehealth Visit Follow up Note -During COVID -19 Pandemic  This visit type was conducted due to national recommendations for restrictions regarding the COVID-19 Pandemic  in an effort to limit this patient's exposure and mitigate transmission of the corona virus.  Due to her co-morbid illnesses, Stephanie Manning is at  moderate to high risk for complications without adequate follow up.  This format is felt to be most appropriate for her at this time.  I connected with this patient on 12/07/2018   by telephone and verified that I am speaking with the correct person using two identifiers. Stephanie Manning, 02-01-1961. she has verbally consented to this visit. All issues noted in this document were discussed and addressed. The format was not optimal for physical exam.    Subjective:    Patient ID: Stephanie Manning, female    DOB: 01/28/1961.  Stephanie Manning is being engaged in telehealth via telephone on follow-up after she was seen in consultation for management of currently uncontrolled symptomatic diabetes requested by  [name], MD.   Past Medical History:  Diagnosis Date  . AKI (acute kidney injury) ([place]) 02/2017  . CAD in native artery    a. NSTEMI 02/2017 s/p DES to mLAD, diffuse disease otherwise, EF 30-35%. b. Staged cath 03/2017 - patent stent in the proximal to mid LAD. CTO of the distal LAD, 90% diffuse small first diagonal, 90% distal LCx/OM, 80% diffuse prox RCA, moderately elevated LVEDP 44mHg, s/p DES to mRCA, residual disease treated medically (too small for PCI), EF 30-35%.  . Chronic combined systolic and diastolic CHF (congestive heart failure) ([facility]   . CKD (chronic kidney disease), stage IV ([facility]    /[name]2/09/2017  . Diabetes mellitus, type II, insulin dependent ([facility]   . GERD  (gastroesophageal reflux disease)   . History of blood transfusion    "related to menses"  . History of stomach ulcers 2017  . Hyperlipidemia   . Hypertension   . Iron deficiency anemia 03/2017   "had to have iron infusions" (10/10/2017)  . Ischemic cardiomyopathy   . STEMI (ST elevation myocardial infarction) ([facility] 02/2017    Past Surgical History:  Procedure Laterality Date  . CESAREAN SECTION  1987  . CORONARY ANGIOPLASTY WITH STENT PLACEMENT  04/01/2017   drug-eluting stent was successfully placed using a STENT SYNERGY DES [id number] . CORONARY STENT INTERVENTION N/A 04/01/2017   Procedure: CORONARY STENT INTERVENTION;  Surgeon: JMartinique [name], MD;  [facility];  Service: Cardiovascular;  Laterality: N/A;  . CORONARY/GRAFT ACUTE MI REVASCULARIZATION N/A 03/10/2017   Procedure: Coronary/Graft Acute MI Revascularization;  Surgeon: JMartinique [name], MD;  [facility];  Service: Cardiovascular;  Laterality: N/A;  . LEFT HEART CATH AND CORONARY ANGIOGRAPHY N/A 03/10/2017  Procedure: LEFT HEART CATH AND CORONARY ANGIOGRAPHY;  Surgeon: [name], [name], MD;  Location: [place] [facility];  Service: Cardiovascular;  Laterality: N/A;  . LEFT HEART CATH AND CORONARY ANGIOGRAPHY N/A 04/01/2017   Procedure: LEFT HEART CATH AND CORONARY ANGIOGRAPHY;  Surgeon: [name], [name], MD;  Location: [facility];  Service: Cardiovascular;  Laterality: N/A;  . LEFT HEART CATH AND CORONARY ANGIOGRAPHY N/A 10/12/2017   Procedure: LEFT HEART CATH AND CORONARY ANGIOGRAPHY;  Surgeon: Sherren Mocha, MD;  Location: [place] [facility];  Service: Cardiovascular;  Laterality: N/A;  . OOPHORECTOMY     "1 ovary removed"  . THROAT SURGERY  02/2014   "nodule in my throat removed; not related to thyroid"    Social History   Socioeconomic History  . Marital status: Widowed    Spouse name: Not on file  . Number of children: Not on file  . Years of education: Not on file  .  Highest education level: Not on file  Occupational History  . Not on file  Social Needs  . Financial resource strain: Not on file  . Food insecurity    Worry: Not on file    Inability: Not on file  . Transportation needs    Medical: Not on file    Non-medical: Not on file  Tobacco Use  . Smoking status: Never Smoker  . Smokeless tobacco: Never Used  Substance and Sexual Activity  . Alcohol use: Never    Frequency: Never  . Drug use: Never  . Sexual activity: Yes  Lifestyle  . Physical activity    Days per week: Not on file    Minutes per session: Not on file  . Stress: Not on file  Relationships  . Social Herbalist on phone: Not on file    Gets together: Not on file    Attends religious service: Not on file    Active member of club or organization: Not on file    Attends meetings of clubs or organizations: Not on file    Relationship status: Not on file  Other Topics Concern  . Not on file  Social History Narrative  . Not on file    Family History  Problem Relation Age of Onset  . Hypertension Mother   . Heart disease Mother   . Diabetes Mother   . Stroke Mother     Outpatient Encounter Medications as of 12/07/2018  Medication Sig  . Accu-Chek Softclix Lancets lancets Use as instructed  . aspirin 81 MG chewable tablet Chew 1 tablet (81 mg total) by mouth daily.  Marland Kitchen atorvastatin (LIPITOR) 80 MG tablet Take 1 tablet (80 mg total) by mouth daily at 6 PM. NEEDS APPOINTMENT WITH DR [name] FOR FUTURE REFILLS  . blood glucose meter kit and supplies Dispense based on patient and insurance preference. Use up to four times daily as directed. (FOR ICD-10 E11.65) Pt prefers One touch mini  . BRILINTA 60 MG TABS tablet TAKE ONE TABLET BY MOUTH TWICE A DAY  . carvedilol (COREG) 12.5 MG tablet Take 1 tablet (12.5 mg total) by mouth 2 (two) times daily with a meal.  . ELDERBERRY PO Take by mouth daily.  Marland Kitchen ENTRESTO 49-51 MG TAKE ONE TABLET BY MOUTH TWICE A DAY  .  ferrous sulfate 300 (60 Fe) MG/5ML syrup Take 300 mg by mouth daily with breakfast.  . gabapentin (NEURONTIN) 300 MG capsule Take 600 mg by mouth at bedtime.  Marland Kitchen glucose blood (  ACCU-CHEK GUIDE) test strip Use as instructed  . insulin aspart (NOVOLOG FLEXPEN) 100 UNIT/ML FlexPen Inject 6-12 Units into the skin 3 (three) times daily before meals.  . Insulin Detemir (LEVEMIR) 100 UNIT/ML Pen Inject 40 Units into the skin at bedtime.  . Multiple Vitamin (MULTIVITAMIN WITH MINERALS) TABS tablet Take 1 tablet by mouth daily.  . nitroGLYCERIN (NITROSTAT) 0.4 MG SL tablet DISSOLVE 1 TAB UNDER TONGUE FOR CHEST PAIN - IF PAIN REMAINS AFTER 5 MIN, CALL 911 AND REPEAT DOSE. MAX 3 TABS IN 15 MINUTES  . pantoprazole (PROTONIX) 40 MG tablet Take 1 tablet (40 mg total) by mouth 2 (two) times daily.  . Saline 0.2 % SOLN Place 1 spray into both nostrils as needed (congestion).   Marland Kitchen spironolactone (ALDACTONE) 25 MG tablet Take 1 tablet (25 mg total) by mouth every evening.  Marland Kitchen tiZANidine (ZANAFLEX) 4 MG tablet Take 4 mg by mouth at bedtime.    No facility-administered encounter medications on file as of 12/07/2018.     ALLERGIES: Allergies  Allergen Reactions  . Bidil [Isosorb Dinitrate-Hydralazine]   . Amoxicillin Other (See Comments)    Yeast infection during treatment  . Hydrocodone Palpitations, Rash and Other (See Comments)    GI upset, also  . Lisinopril Palpitations and Other (See Comments)    Chest pain, too  . Meloxicam Nausea Only  . Strawberry Extract Hives    Does not always happen  . Tape Rash    Some tapes cause rashes  . Toradol [Ketorolac Tromethamine] Palpitations    VACCINATION STATUS:  There is no immunization history on file for this patient.  Diabetes She presents for her follow-up diabetic visit. She has type 2 diabetes mellitus. Onset time: She was diagnosed at approximate age of 17 years. Her disease course has been improving. There are no hypoglycemic associated symptoms.  Pertinent negatives for hypoglycemia include no confusion, headaches, pallor or seizures. Associated symptoms include fatigue and polyuria. Pertinent negatives for diabetes include no chest pain, no polydipsia and no polyphagia. There are no hypoglycemic complications. Symptoms are improving. Diabetic complications include heart disease, nephropathy and retinopathy. Risk factors for coronary artery disease include diabetes mellitus, dyslipidemia, hypertension, obesity, sedentary lifestyle and post-menopausal. Current diabetic treatments: She is currently on Levemir 50 units nightly, 25 units in the morning.  She is also on NovoLog 2-20 units 3 times daily. She is following a generally unhealthy diet. When asked about meal planning, she reported none. She has not had a previous visit with a dietitian. She never participates in exercise. Her home blood glucose trend is fluctuating minimally. (She just got her new meter, used it for 1 day.  She reports 1 episode of hypoglycemia 49 due to late breakfast.   -Her repeat labs show A1c of 7.6% improving from 8.5%.) An ACE inhibitor/angiotensin II receptor blocker is not being taken. Eye exam is current.  Hyperlipidemia This is a chronic problem. The current episode started more than 1 year ago. The problem is uncontrolled. Pertinent negatives include no chest pain, myalgias or shortness of breath. Risk factors for coronary artery disease include diabetes mellitus, dyslipidemia, hypertension, obesity, post-menopausal and family history.  Hypertension This is a chronic problem. The current episode started more than 1 year ago. The problem is controlled. Pertinent negatives include no chest pain, headaches, palpitations or shortness of breath. Risk factors for coronary artery disease include diabetes mellitus, dyslipidemia, obesity, sedentary lifestyle and post-menopausal state. Past treatments include beta blockers. Hypertensive end-organ damage includes kidney disease,  CAD/MI and retinopathy.   Review of systems: Limited as above.  Objective:    There were no vitals taken for this visit.  Wt Readings from Last 3 Encounters:  12/06/18 243 lb (110.2 kg)  10/18/18 241 lb (109.3 kg)  05/16/18 235 lb 3.2 oz (106.7 kg)      CMP ( most recent) CMP     Component Value Date/Time   NA 144 11/27/2018 1521   NA 142 10/19/2017 1600   K 4.2 11/27/2018 1521   CL 111 (H) 11/27/2018 1521   CO2 25 11/27/2018 1521   GLUCOSE 75 11/27/2018 1521   BUN 23 11/27/2018 1521   BUN 17 07/14/2018   CREATININE 1.60 (H) 11/27/2018 1521   CALCIUM 9.2 11/27/2018 1521   PROT 7.4 11/27/2018 1521   ALBUMIN 2.6 (L) 03/15/2017 0253   AST 18 11/27/2018 1521   ALT 13 11/27/2018 1521   ALKPHOS 59 03/15/2017 0253   BILITOT 0.3 11/27/2018 1521   GFRNONAA 35 (L) 11/27/2018 1521   GFRAA 40 (L) 11/27/2018 1521     Diabetic Labs (most recent): Lab Results  Component Value Date   HGBA1C 7.6 (H) 11/27/2018   HGBA1C 8.5 07/14/2018   HGBA1C 8.6 (H) 03/10/2017     Lipid Panel ( most recent) Lipid Panel     Component Value Date/Time   CHOL 126 07/14/2018   TRIG 94 07/14/2018   HDL 34 (A) 07/14/2018   CHOLHDL 3.1 05/16/2018 1412   VLDL 13 05/16/2018 1412   LDLCALC 73 07/14/2018        Assessment & Plan:   1. DM type 2 causing vascular disease    - Nyx Keady has currently uncontrolled symptomatic type 2 DM since  58 years of age. -Her repeat labs show A1c of 7.6% improving from 8.5%. -I discussed her recent labs with her.  - I had a long discussion with her about the progressive nature of diabetes and the pathology behind its complications. -her diabetes is complicated by retinopathy, nephropathy, coronary artery disease and she remains at a high risk for more acute and chronic complications which include CAD, CVA, CKD, retinopathy, and neuropathy. These are all discussed in detail with her.  - I have counseled her on diet management and weight loss,  by adopting a carbohydrate restricted/protein rich diet.  - she  admits there is a room for improvement in her diet and drink choices. -  Suggestion is made for her to avoid simple carbohydrates  from her diet including Cakes, Sweet Desserts / Pastries, Ice Cream, Soda (diet and regular), Sweet Tea, Candies, Chips, Cookies, Sweet Pastries,  Store Bought Juices, Alcohol in Excess of  1-2 drinks a day, Artificial Sweeteners, Coffee Creamer, and "Sugar-free" Products. This will help patient to have stable blood glucose profile and potentially avoid unintended weight gain.   - I encouraged her to switch to  unprocessed or minimally processed complex starch and increased protein intake (animal or plant source), fruits, and vegetables.  - she is advised to stick to a routine mealtimes to eat 3 meals  a day and avoid unnecessary snacks ( to snack only to correct hypoglycemia).   - she will be scheduled with Jearld Fenton, RDN, CDE for diabetes education.  - I have approached her with the following individualized plan to manage  her diabetes and patient agrees:   - she will continue to need intensive treatment with basal/bolus insulin in order for her to maintain control of diabetes to target.    -  Due to late breakfast and morning hypoglycemia , I advised her to lower her Levemir to 40 units nightly, continue NovoLog 6 units 3 times a day with meals   for pre-meal BG readings of 90-'150mg'$ /dl, plus patient specific correction dose for unexpected hyperglycemia above '150mg'$ /dl, associated with strict monitoring of glucose 4 times a day-before meals and at bedtime. - she is warned not to take insulin without proper monitoring per orders. - Adjustment parameters are given to her for hypo and hyperglycemia in writing. - she is encouraged to call clinic for blood glucose levels less than 70 or above 300 mg /dl.  - she is not a candidate for metformin, SGLT2 inhibitors due to concurrent renal insufficiency. -In  the interest of simplifying her treatment regimen, she will be considered for low-dose glipizide in lieu of her prandial insulin on subsequent visits.  - she also will be considered for incretin therapy as appropriate next visit.  - Patient specific target  A1c;  LDL, HDL, Triglycerides, and  Waist Circumference were discussed in detail.  2) Blood Pressure /Hypertension: she is advised to home monitor blood pressure and report if > 140/90 on 2 separate readings.  She is advised to continue her current blood pressure medications including carvedilol 12.5 mg p.o. daily with breakfast .  3) Lipids/Hyperlipidemia:   Review of her recent lipid panel showed  controlled  LDL at 73.  she  is advised to continue atorvastatin 80 mg daily at bedtime.  Side effects and precautions discussed with her.  4)  Weight/Diet: Her BMI is 36--   clearly complicating her diabetes care.  I discussed with her the fact that loss of 5 - 10% of her  current body weight will have the most impact on her diabetes management.  CDE Consult will be initiated . Exercise, and detailed carbohydrates information provided  -  detailed on discharge instructions.  5) Chronic Care/Health Maintenance:  -she  is Statin medications and  is encouraged to initiate and continue to follow up with Ophthalmology, Dentist,  Podiatrist at least yearly or according to recommendations, and advised to   stay away from smoking. I have recommended yearly flu vaccine and pneumonia vaccine at least every 5 years; moderate intensity exercise for up to 150 minutes weekly; and  sleep for at least 7 hours a day.  - she is  advised to maintain close follow up with Margo Common, Jarvis Newcomer, MD for primary care needs, as well as her other providers for optimal and coordinated care.  - Patient Care Time Today:  25 min, of which >50% was spent in  counseling and the rest reviewing her  current and  previous labs/studies, previous treatments, her blood glucose readings, and  medications' doses and developing a plan for long-term care based on the latest recommendations for standards of care.   Stephanie Manning participated in the discussions, expressed understanding, and voiced agreement with the above plans.  All questions were answered to her satisfaction. she is encouraged to contact clinic should she have any questions or concerns prior to her return visit.   Follow up plan: - Return in about 3 months (around 03/08/2019) for Bring Meter and Logs- A1c in Office, Include 8 log sheets.  Glade Lloyd, MD [facility] [address] [place], [place] [address] Phone: [phone]  Fax: [phone]    12/07/2018, 6:13 PM  This note was partially dictated with voice recognition software. Similar sounding words can be transcribed inadequately  or may not  be corrected upon review.

## 2018-12-08 ENCOUNTER — Telehealth: Payer: Self-pay

## 2018-12-08 ENCOUNTER — Ambulatory Visit (HOSPITAL_COMMUNITY)
Admission: RE | Admit: 2018-12-08 | Discharge: 2018-12-08 | Disposition: A | Discharge status: Home / Self Care | Payer: Medicare Other | Source: Ambulatory Visit | Attending: Cardiology | Admitting: Cardiology

## 2018-12-08 ENCOUNTER — Encounter (HOSPITAL_COMMUNITY): Payer: Self-pay | Admitting: Cardiology

## 2018-12-08 ENCOUNTER — Other Ambulatory Visit: Payer: Self-pay

## 2018-12-08 VITALS — BP 131/82 | HR 69 | Wt 241.6 lb

## 2018-12-08 DIAGNOSIS — Z880 Allergy status to penicillin: Secondary | ICD-10-CM | POA: Insufficient documentation

## 2018-12-08 DIAGNOSIS — Z888 Allergy status to other drugs, medicaments and biological substances status: Secondary | ICD-10-CM | POA: Insufficient documentation

## 2018-12-08 DIAGNOSIS — E1122 Type 2 diabetes mellitus with diabetic chronic kidney disease: Secondary | ICD-10-CM | POA: Diagnosis not present

## 2018-12-08 DIAGNOSIS — Z955 Presence of coronary angioplasty implant and graft: Secondary | ICD-10-CM | POA: Diagnosis not present

## 2018-12-08 DIAGNOSIS — I5022 Chronic systolic (congestive) heart failure: Secondary | ICD-10-CM | POA: Insufficient documentation

## 2018-12-08 DIAGNOSIS — K219 Gastro-esophageal reflux disease without esophagitis: Secondary | ICD-10-CM | POA: Insufficient documentation

## 2018-12-08 DIAGNOSIS — Z833 Family history of diabetes mellitus: Secondary | ICD-10-CM | POA: Insufficient documentation

## 2018-12-08 DIAGNOSIS — Z823 Family history of stroke: Secondary | ICD-10-CM | POA: Insufficient documentation

## 2018-12-08 DIAGNOSIS — D509 Iron deficiency anemia, unspecified: Secondary | ICD-10-CM | POA: Insufficient documentation

## 2018-12-08 DIAGNOSIS — I255 Ischemic cardiomyopathy: Secondary | ICD-10-CM | POA: Diagnosis not present

## 2018-12-08 DIAGNOSIS — Z7902 Long term (current) use of antithrombotics/antiplatelets: Secondary | ICD-10-CM | POA: Diagnosis not present

## 2018-12-08 DIAGNOSIS — N183 Chronic kidney disease, stage 3 (moderate): Secondary | ICD-10-CM | POA: Insufficient documentation

## 2018-12-08 DIAGNOSIS — R9431 Abnormal electrocardiogram [ECG] [EKG]: Secondary | ICD-10-CM | POA: Insufficient documentation

## 2018-12-08 DIAGNOSIS — I25110 Atherosclerotic heart disease of native coronary artery with unstable angina pectoris: Secondary | ICD-10-CM | POA: Diagnosis not present

## 2018-12-08 DIAGNOSIS — Z794 Long term (current) use of insulin: Secondary | ICD-10-CM | POA: Diagnosis not present

## 2018-12-08 DIAGNOSIS — Z91018 Allergy to other foods: Secondary | ICD-10-CM | POA: Insufficient documentation

## 2018-12-08 DIAGNOSIS — I2510 Atherosclerotic heart disease of native coronary artery without angina pectoris: Secondary | ICD-10-CM

## 2018-12-08 DIAGNOSIS — I5042 Chronic combined systolic (congestive) and diastolic (congestive) heart failure: Secondary | ICD-10-CM | POA: Diagnosis not present

## 2018-12-08 DIAGNOSIS — Z79899 Other long term (current) drug therapy: Secondary | ICD-10-CM | POA: Diagnosis not present

## 2018-12-08 DIAGNOSIS — I252 Old myocardial infarction: Secondary | ICD-10-CM | POA: Diagnosis not present

## 2018-12-08 DIAGNOSIS — E1159 Type 2 diabetes mellitus with other circulatory complications: Secondary | ICD-10-CM

## 2018-12-08 DIAGNOSIS — Z8249 Family history of ischemic heart disease and other diseases of the circulatory system: Secondary | ICD-10-CM | POA: Insufficient documentation

## 2018-12-08 DIAGNOSIS — Z885 Allergy status to narcotic agent status: Secondary | ICD-10-CM | POA: Diagnosis not present

## 2018-12-08 DIAGNOSIS — Z7982 Long term (current) use of aspirin: Secondary | ICD-10-CM | POA: Insufficient documentation

## 2018-12-08 DIAGNOSIS — I130 Hypertensive heart and chronic kidney disease with heart failure and stage 1 through stage 4 chronic kidney disease, or unspecified chronic kidney disease: Secondary | ICD-10-CM | POA: Insufficient documentation

## 2018-12-08 DIAGNOSIS — E785 Hyperlipidemia, unspecified: Secondary | ICD-10-CM | POA: Insufficient documentation

## 2018-12-08 MED ORDER — ACCU-CHEK GUIDE VI STRP: ORAL_STRIP | 2 refills | Status: DC

## 2018-12-08 MED ORDER — SACUBITRIL-VALSARTAN 97-103 MG PO TABS: 1.0000 | ORAL_TABLET | Freq: Two times a day (BID) | ORAL | 6 refills | Status: DC

## 2018-12-08 MED ORDER — ACCU-CHEK SOFTCLIX LANCETS MISC: 2 refills | Status: AC

## 2018-12-08 NOTE — Patient Instructions (Addendum)
INCREASE Entresto to 97/103mg  (1 tab) twice a day  Labs in 10 days. A prescription was given to be drawn locally We will only contact you if something comes back abnormal or we need to make some changes. Otherwise no news is good news!  Your physician recommends that you schedule a follow-up appointment in: 3 months with Dr [name].   At the [facility], you and your health needs are our priority. As part of our continuing mission to provide you with exceptional heart care, we have created designated Provider Care Teams. These Care Teams include your primary Cardiologist (physician) and Advanced Practice Providers (APPs- Physician Assistants and Nurse Practitioners) who all work together to provide you with the care you need, when you need it.   You may see any of the following providers on your designated Care Team at your next follow up: Marland Kitchen Dr Glori Bickers . Dr Loralie Champagne . Darrick Grinder, NP   Please be sure to bring in all your medications bottles to every appointment.

## 2018-12-08 NOTE — Telephone Encounter (Signed)
Stephanie Manning, CMA  

## 2018-12-08 NOTE — Telephone Encounter (Signed)
LeighAnn Tommi Crepeau, CMA  

## 2018-12-10 NOTE — Progress Notes (Signed)
Date:  12/10/2018   ID:  [name], DOB [birth year], MRN [id number]  Provider location: [facility] Type of Visit: Established patient  PCP:  [name], MD  Cardiologist: Dr. [name]  Chief Complaint: Chest pain.    History of Present Illness: Stephanie Manning is a 58 y.o. female who has a history of CAD and ischemic cardiomyopathy.  In 12/18, she had anterior MI with DES to totally occluded mid LAD (culprit). She returned in 1/19 for staged DES to 80% RCA stenosis. Echo in 12/18 showed EF 30-35%.  Repeat echo in 3/19 showed EF stable at 30-35%.    She was admitted in 7/19 with chest pain, concern for unstable angina.  Culprit of symptoms was thought to be 95% stenosis in a small PLV vessel, medical treatment planned.   She saw Dr. Rayann Heman and decided against ICD.   Echo was done in 6/20 showing stable EF 30-35%.   She returns for followup of CHF.  She was unable to tolerate Bidil 1/2 tablet tid due to lightheadedness. She stopped it and is no longer lightheaded.  SBP 120s-130s at home.  She is doing well, able to garden and do yardwork.  She is not using Lasix.  No dyspnea walking on flat ground or climbing stairs.  Only gets short of breath with a long walk.       ECG (personally reviewed): NSR, inferior and lateral TWIs.   Labs (2/19): K 4.7, creatinine 1.48, hgb 8.4 Labs (5/19): LDL 65, K 4.7, creatinine 1.66 Labs (8/19): K 5, creatinine 1.78  Labs (9/19): K 3.8, creatinine 1.6 Labs (12/19): K 3.9, creatinine 1.59 Labs (3/20): LDL 73, HDL 41 Labs (5/20): K 4.1, creatinine 1.59, LDL 73 Labs (9/20): K 4.2, creatinine 1.6  PMH: 1. Type II diabetes 2. CKD stage 3.  3. Hyperlipidemia 4. HTN 5. Fe deficiency anemia 6. GERD 7. CAD: Anterior MI in 12/18 with DES to 100% stenosed mid LAD (culprit), also with CTO of distal LAD, 80% pRCA, 85% mLCx/90% dLCx, 90% OM3.  - 1/19 staged PCI of proximal RCA.  - Admitted with unstable  angina 7/19.  LHC: 75% stenosis proximal-mid LCx, 60% OM1, total occlusion of the distal LAD, patent mid LAD stent, patent RCA stents, 95% stenosis in small PLV branch (likely culprit).  8. Chronic systolic CHF: Ischemic cardiomyopathy.  - Echo (12/18): EF 30-35%.  - Echo (3/19): EF 30-35%, WMAs in LAD territory, RV mildly dilated.  - Echo (6/20): EF 30-35%, mild LV dilation, normal RV size and systolic function.   Current Outpatient Medications  Medication Sig Dispense Refill  . aspirin 81 MG chewable tablet Chew 1 tablet (81 mg total) by mouth daily.    Marland Kitchen atorvastatin (LIPITOR) 80 MG tablet Take 1 tablet (80 mg total) by mouth daily at 6 PM. NEEDS APPOINTMENT WITH DR [name] FOR FUTURE REFILLS 30 tablet 0  . blood glucose meter kit and supplies Dispense based on patient and insurance preference. Use up to four times daily as directed. (FOR ICD-10 E11.65) Pt prefers One touch mini 1 each 5  . BRILINTA 60 MG TABS tablet TAKE ONE TABLET BY MOUTH TWICE A DAY 120 tablet 5  . carvedilol (COREG) 12.5 MG tablet Take 1 tablet (12.5 mg total) by mouth 2 (two) times daily with a meal. 180 tablet 3  . ELDERBERRY PO Take by mouth daily.    . ferrous sulfate 300 (60 Fe) MG/5ML syrup Take 300 mg  by mouth daily with breakfast.    . gabapentin (NEURONTIN) 300 MG capsule Take 600 mg by mouth at bedtime.    Marland Kitchen glucose blood (ACCU-CHEK GUIDE) test strip TEST 4 TIMES DAILY BEFORE MEALS AND AT BEDTIME Dx: E11.59 150 each 2  . insulin aspart (NOVOLOG FLEXPEN) 100 UNIT/ML FlexPen Inject 6-12 Units into the skin 3 (three) times daily before meals.    . Insulin Detemir (LEVEMIR) 100 UNIT/ML Pen Inject 40 Units into the skin at bedtime.    . Multiple Vitamin (MULTIVITAMIN WITH MINERALS) TABS tablet Take 1 tablet by mouth daily.    . nitroGLYCERIN (NITROSTAT) 0.4 MG SL tablet DISSOLVE 1 TAB UNDER TONGUE FOR CHEST PAIN - IF PAIN REMAINS AFTER 5 MIN, CALL 911 AND REPEAT DOSE. MAX 3 TABS IN 15 MINUTES 25 tablet 0  .  pantoprazole (PROTONIX) 40 MG tablet Take 1 tablet (40 mg total) by mouth 2 (two) times daily. 60 tablet 2  . Saline 0.2 % SOLN Place 1 spray into both nostrils as needed (congestion).     Marland Kitchen spironolactone (ALDACTONE) 25 MG tablet Take 1 tablet (25 mg total) by mouth every evening. 90 tablet 0  . tiZANidine (ZANAFLEX) 4 MG tablet Take 4 mg by mouth at bedtime.     . Accu-Chek Softclix Lancets lancets Use as instructed 100 each 2  . sacubitril-valsartan (ENTRESTO) 97-103 MG Take 1 tablet by mouth 2 (two) times daily. 60 tablet 6   No current facility-administered medications for this encounter.     Allergies:   Bidil [isosorb dinitrate-hydralazine], Amoxicillin, Hydrocodone, Lisinopril, Meloxicam, Strawberry extract, Tape, and Toradol [ketorolac tromethamine]   Social History:  The patient  reports that she has never smoked. She has never used smokeless tobacco. She reports that she does not drink alcohol or use drugs.   Family History:  The patient's family history includes Diabetes in her mother; Heart disease in her mother; Hypertension in her mother; Stroke in her mother.   ROS:  Please see the history of present illness.   All other systems are personally reviewed and negative.   Exam:   BP 131/82   Pulse 69   Wt 109.6 kg (241 lb 9.6 oz)   SpO2 98%   BMI 36.20 kg/m  General: NAD Neck: No JVD, no thyromegaly or thyroid nodule.  Lungs: Clear to auscultation bilaterally with normal respiratory effort. CV: Nondisplaced PMI.  Heart regular S1/S2, no S3/S4, no murmur.  No peripheral edema.  No carotid bruit.  Normal pedal pulses.  Abdomen: Soft, nontender, no hepatosplenomegaly, no distention.  Skin: Intact without lesions or rashes.  Neurologic: Alert and oriented x 3.  Psych: Normal affect. Extremities: No clubbing or cyanosis.  HEENT: Normal.   Recent Labs: 11/27/2018: ALT 13; BUN 23; Creat 1.60; Potassium 4.2; Sodium 144; TSH 1.57  Personally reviewed   Wt Readings from Last  3 Encounters:  12/08/18 109.6 kg (241 lb 9.6 oz)  12/06/18 110.2 kg (243 lb)  10/18/18 109.3 kg (241 lb)      ASSESSMENT AND PLAN:  1. CAD: Anterior MI in 12/18 with DES to proximal LAD (culprit) followed by staged DES to proximal RCA.  7/19 admission with unstable angina, likely culprit was 95% stenosis in small PLV.  This was managed conservatively.  No recent chest pain.     - Continue atorvastatin, good lipids in 5/20.     - Continue ASA 81 daily and ticagrelor 60 mg bid.  2. Chronic systolic CHF: Ischemic cardiomyopathy. EF has been  stably low in the 30-35% range (no change on 6/20 echo).  NYHA class I-II symptoms.  She is not volume overloaded.  She saw Dr. Rayann Heman and decided against ICD.  Narrow QRS, so not CRT candidate.  - Continue Coreg 12.5 mg bid.     - Increase Entresto to 97/103 bid. BMET 10 days.  - Can continue prn use of Lasix.  - Continue spironolactone 25 mg daily.    - She was unable to tolerate Bidil at lowest does due to lightheadedness.    - We discussed ICD again, she is not interested at this time but understands the rationale behind getting one.  3. CKD: stage 3.    4. Fe deficiency anemia: Long-standing.  She has had IV Fe infusions. Check Fe studies.  5. Type II DM: Next step will be addition of SGLT2 inhibitor.   Followup in 3 months.    Signed, Loralie Champagne, MD  12/10/2018  [facility] [address] [facility] [place] [address] [phone] (office) [phone] (fax)

## 2018-12-11 ENCOUNTER — Ambulatory Visit: Payer: Medicare Other | Admitting: "Endocrinology

## 2018-12-12 ENCOUNTER — Encounter: Payer: Self-pay | Admitting: Nutrition

## 2018-12-12 NOTE — Patient Instructions (Addendum)
Goals  Follow My Plate  Eat three balanced meals   Do not skip meals  Eat meals on time  Drink water  Walk 15-30 minutes daily. Get A1C down to 7% or less. Take insulin as prescribed and use sliding scale correctly.

## 2018-12-14 ENCOUNTER — Other Ambulatory Visit: Payer: Self-pay

## 2018-12-14 DIAGNOSIS — E1159 Type 2 diabetes mellitus with other circulatory complications: Secondary | ICD-10-CM

## 2018-12-14 MED ORDER — ACCU-CHEK GUIDE VI STRP: ORAL_STRIP | 5 refills | Status: DC

## 2019-01-02 ENCOUNTER — Other Ambulatory Visit (HOSPITAL_COMMUNITY): Payer: Self-pay | Admitting: Cardiology

## 2019-01-13 NOTE — Progress Notes (Signed)
Cardiology Office Note    Date:  01/16/2019   ID:  Stephanie Manning, Stephanie Manning 25-Aug-1960, MRN [id number]  PCP:  [name], MD  Cardiologist: Dr. [name] Heart failure clinic: Dr. [name]  Chief Complaint  Patient presents with  . Coronary Artery Disease    History of Present Illness:  Stephanie Manning is a 58 y.o. female with PMH of CAD, DM II, CKD stage IV, HTN, HLD, CAD and ICM.  In December 2018 she had anterior MI with DES to totally occluded mid LAD.  She returned in January 2019 for staged DES to 80% RCA disease.  Echocardiogram in December 2018 showed ejection fraction of 30 to 35%.  Repeat echocardiogram in March 2019 continue to show ejection fraction 30 to 35%.  Despite intervention, she still has intermittent chest discomfort.  She was referred to Dr. [name] for management of heart failure.  She was last seen in the heart failure clinic on 09/27/2017, she was on carvedilol, Entresto was increased to 49-51 mg twice daily, Lasix reduced to 20 mg every other day and she is also on spironolactone 12.5 mg daily.  Given her persistent low ejection fraction, she was referred to electrophysiology service for consideration of ICD.  She was admitted to the hospital on 10/10/2017 with recurrent chest pain.  She underwent Myoview which came back high risk.  She eventually underwent cardiac catheterization on 10/12/2017 which showed 75% prox LCx, 60% OM1, 40% LM, 100% mid LAD chronic occlusion, 95% post atrio lesion.  The culprit lesion was felt to be post atrial lesion, however best management was medical therapy given the small caliber of the artery.  She was recommended to continue aspirin and Brilinta for long-term, beyond 64-month  I increased her Imdur to 60 mg daily.  During this admission, carvedilol was reduced to 6.25 mg twice daily due to bradycardia, Mobitz type I and up to 2 dropped beats and dizziness.  Her Entresto was increased  twice daily for high blood pressure.  She has  been followed closely by Dr MAundra Dubinin the CHF clinic. Imdur was stopped and Bidil tried but she did not tolerate due to lightheadedness. On her last visit there her EDelene Lollwas increased. She is on Coreg and aldactone. She had follow up with Echo in June and EF was unchanged. She has been seen by Dr ARayann Hemanfor consideration of ICD but has deferred.   She was admitted this past weekend for evaluation of chest pain and felt like she was going to pass out. Serial HS troponin were negative. Ecg was stable. Other labs OK. She was started back on Imdur and DC home. She has no further CP since then.    Past Medical History:  Diagnosis Date  . AKI (acute kidney injury) ([facility] 02/2017  . CAD in native artery    a. NSTEMI 02/2017 s/p DES to mLAD, diffuse disease otherwise, EF 30-35%. b. Staged cath 03/2017 - patent stent in the proximal to mid LAD. CTO of the distal LAD, 90% diffuse small first diagonal, 90% distal LCx/OM, 80% diffuse prox RCA, moderately elevated LVEDP 279mg, s/p DES to mRCA, residual disease treated medically (too small for PCI), EF 30-35%.  . Chronic combined systolic and diastolic CHF (congestive heart failure) ([facility]  . CKD (chronic kidney disease), stage IV ([facility]   /nArchie Endo/09/2017  . Diabetes mellitus, type II, insulin dependent ([facility]  . GERD (gastroesophageal reflux disease)   . History of blood transfusion    "  related to menses"  . History of stomach ulcers 2017  . Hyperlipidemia   . Hypertension   . Iron deficiency anemia 03/2017   "had to have iron infusions" (10/10/2017)  . Ischemic cardiomyopathy   . STEMI (ST elevation myocardial infarction) ([facility]) 02/2017    Past Surgical History:  Procedure Laterality Date  . CESAREAN SECTION  1987  . CORONARY ANGIOPLASTY WITH STENT PLACEMENT  04/01/2017   drug-eluting stent was successfully placed using a STENT SYNERGY DES 9.32I71  . CORONARY STENT INTERVENTION N/A 04/01/2017   Procedure: CORONARY STENT INTERVENTION;  Surgeon:  [name], Demetria Lightsey M, MD;  Location: [facility];  Service: Cardiovascular;  Laterality: N/A;  . CORONARY/GRAFT ACUTE MI REVASCULARIZATION N/A 03/10/2017   Procedure: Coronary/Graft Acute MI Revascularization;  Surgeon: [name], Jaclynn Laumann M, MD;  Location: [facility];  Service: Cardiovascular;  Laterality: N/A;  . LEFT HEART CATH AND CORONARY ANGIOGRAPHY N/A 03/10/2017   Procedure: LEFT HEART CATH AND CORONARY ANGIOGRAPHY;  Surgeon: [name], Viktoriya Glaspy M, MD;  Location: [facility];  Service: Cardiovascular;  Laterality: N/A;  . LEFT HEART CATH AND CORONARY ANGIOGRAPHY N/A 04/01/2017   Procedure: LEFT HEART CATH AND CORONARY ANGIOGRAPHY;  Surgeon: [name], Imanii Gosdin M, MD;  Location: [facility];  Service: Cardiovascular;  Laterality: N/A;  . LEFT HEART CATH AND CORONARY ANGIOGRAPHY N/A 10/12/2017   Procedure: LEFT HEART CATH AND CORONARY ANGIOGRAPHY;  Surgeon: Sherren Mocha, MD;  Location: [facility];  Service: Cardiovascular;  Laterality: N/A;  . OOPHORECTOMY     "1 ovary removed"  . THROAT SURGERY  02/2014   "nodule in my throat removed; not related to thyroid"    Current Medications: Outpatient Medications Prior to Visit  Medication Sig Dispense Refill  . Accu-Chek Softclix Lancets lancets Use as instructed 100 each 2  . aspirin 81 MG chewable tablet Chew 1 tablet (81 mg total) by mouth daily.    Marland Kitchen atorvastatin (LIPITOR) 80 MG tablet Take 1 tablet (80 mg total) by mouth daily at 6 PM. NEEDS APPOINTMENT WITH DR [name] FOR FUTURE REFILLS 30 tablet 0  . blood glucose meter kit and supplies Dispense based on patient and insurance preference. Use up to four times daily as directed. (FOR ICD-10 E11.65) Pt prefers One touch mini 1 each 5  . BRILINTA 60 MG TABS tablet TAKE ONE TABLET BY MOUTH TWICE A DAY (Patient taking differently: Take 60 mg by mouth 2 (two) times daily. ) 120 tablet 5  . carvedilol (COREG) 12.5 MG tablet Take 1 tablet (12.5 mg total) by mouth 2 (two) times daily  with a meal. 180 tablet 3  . ELDERBERRY PO Take 2 tablets by mouth every other day.     . ferrous sulfate 300 (60 Fe) MG/5ML syrup Take 300 mg by mouth daily with breakfast.    . gabapentin (NEURONTIN) 300 MG capsule Take 600 mg by mouth at bedtime.    Marland Kitchen glucose blood (ACCU-CHEK GUIDE) test strip TEST 4 TIMES DAILY BEFORE MEALS AND AT BEDTIME Dx: E11.59 150 each 5  . insulin aspart (NOVOLOG FLEXPEN) 100 UNIT/ML FlexPen Inject 6-12 Units into the skin 3 (three) times daily before meals.    . Insulin Detemir (LEVEMIR) 100 UNIT/ML Pen Inject 40 Units into the skin at bedtime.    . isosorbide mononitrate (IMDUR) 30 MG 24 hr tablet Take 1 tablet (30 mg total) by mouth daily. 30 tablet 6  . Multiple Vitamin (MULTIVITAMIN WITH MINERALS) TABS tablet Take 1 tablet by  mouth daily.    . nitroGLYCERIN (NITROSTAT) 0.4 MG SL tablet DISSOLVE 1 TAB UNDER TONGUE FOR CHEST PAIN - IF PAIN REMAINS AFTER 5 MIN, CALL 911 AND REPEAT DOSE. MAX 3 TABS IN 15 MINUTES (Patient taking differently: Place 0.4 mg under the tongue every 5 (five) minutes as needed for chest pain. ) 25 tablet 0  . pantoprazole (PROTONIX) 40 MG tablet Take 1 tablet (40 mg total) by mouth 2 (two) times daily. 60 tablet 2  . sacubitril-valsartan (ENTRESTO) 97-103 MG Take 1 tablet by mouth 2 (two) times daily. 60 tablet 6  . Saline 0.2 % SOLN Place 1 spray into both nostrils as needed (congestion).     Marland Kitchen spironolactone (ALDACTONE) 25 MG tablet Take 1 tablet (25 mg total) by mouth every evening. 90 tablet 0  . tiZANidine (ZANAFLEX) 4 MG tablet Take 4 mg by mouth at bedtime.     Marland Kitchen VITAMIN D PO Take 1 tablet by mouth 2 (two) times a week. Mondays and Wednesdays     No facility-administered medications prior to visit.      Allergies:   Hydralazine, Bidil [isosorb dinitrate-hydralazine], Amoxicillin, Hydrocodone, Lisinopril, Meloxicam, Strawberry extract, Tape, and Toradol [ketorolac tromethamine]   Social History   Socioeconomic History  . Marital  status: Widowed    Spouse name: Not on file  . Number of children: 2  . Years of education: Not on file  . Highest education level: Not on file  Occupational History  . Not on file  Social Needs  . Financial resource strain: Not on file  . Food insecurity    Worry: Not on file    Inability: Not on file  . Transportation needs    Medical: Not on file    Non-medical: Not on file  Tobacco Use  . Smoking status: Never Smoker  . Smokeless tobacco: Never Used  Substance and Sexual Activity  . Alcohol use: Never    Frequency: Never  . Drug use: Never  . Sexual activity: Yes  Lifestyle  . Physical activity    Days per week: Not on file    Minutes per session: Not on file  . Stress: Not on file  Relationships  . Social Herbalist on phone: Not on file    Gets together: Not on file    Attends religious service: Not on file    Active member of club or organization: Not on file    Attends meetings of clubs or organizations: Not on file    Relationship status: Not on file  Other Topics Concern  . Not on file  Social History Narrative  . Not on file     Family History:  The patient's family history includes Diabetes in her mother; Heart disease in her mother; Hypertension in her mother; Stroke in her mother.   ROS:   Please see the history of present illness.    ROS All other systems reviewed and are negative.   PHYSICAL EXAM:   VS:  BP 128/72   Pulse 72   Ht 5' 8.5" (1.74 m)   Wt 245 lb (111.1 kg)   SpO2 100%   BMI 36.71 kg/m    GEN: Well nourished, well developed, in no acute distress  HEENT: normal  Neck: no JVD, carotid bruits, or masses Cardiac: RRR; no murmurs, rubs, or gallops,no edema  Respiratory:  clear to auscultation bilaterally, normal work of breathing GI: soft, nontender, nondistended, + BS MS: no deformity or atrophy  Skin: warm and dry, no rash Neuro:  Alert and Oriented x 3, Strength and sensation are intact Psych: euthymic mood, full  affect  Wt Readings from Last 3 Encounters:  01/16/19 245 lb (111.1 kg)  01/14/19 240 lb 8.4 oz (109.1 kg)  12/08/18 241 lb 9.6 oz (109.6 kg)      Studies/Labs Reviewed:   EKG:  EKG is not ordered today.    Recent Labs: 11/27/2018: ALT 13 01/14/2019: B Natriuretic Peptide 151.3; BUN 25; Creatinine, Ser 1.71; Hemoglobin 9.6; Platelets 197; Potassium 4.1; Sodium 141; TSH 2.377   Lipid Panel    Component Value Date/Time   CHOL 100 01/14/2019 0433   TRIG 92 01/14/2019 0433   HDL 32 (L) 01/14/2019 0433   CHOLHDL 3.1 01/14/2019 0433   VLDL 18 01/14/2019 0433   LDLCALC 50 01/14/2019 0433    Additional studies/ records that were reviewed today include:   Cath 10/12/2017  Ost LM lesion is 40% stenosed.  Prox Cx to Dist Cx lesion is 75% stenosed.  Ost 1st Mrg lesion is 60% stenosed.  Previously placed Prox LAD to Mid LAD stent (unknown type) is widely patent.  Mid LAD lesion is 100% stenosed.  Ost 2nd Diag to 2nd Diag lesion is 70% stenosed.  Prox RCA lesion is 30% stenosed.  Mid RCA lesion is 40% stenosed.  Post Atrio lesion is 95% stenosed.   1.  Continued patency of the stented segment in the proximal LAD and stented segment in the proximal RCA 2.  Progressive small vessel CAD involving the distal left circumflex and right posterolateral branch 3.  Chronic occlusion of the mid LAD beyond the second diagonal branch unchanged in appearance from the previous study 4.  Normal LVEDP  Recommendation: Ongoing aggressive medical therapy.  The patient's probable culprit lesion is a severe stenosis in the distal posterior lateral branch involving a small territory and small caliber vessel that I think would be best treated with medical therapy rather than intervention.  Recommend dual antiplatelet therapy with Aspirin 44m daily and Ticagrelor 962mtwice daily long-term (beyond 12 months) because of diffuse CAD/diabetes/multivessel stenting.  Echo 09/01/18: IMPRESSIONS     1. The left ventricle has moderate-severely reduced systolic function, with an ejection fraction of 30-35%. The cavity size was mildly dilated. Left ventricular diastolic Doppler parameters are consistent with pseudonormalization.  2. The right ventricle has normal systolic function. The cavity was normal.  3. The mitral valve is grossly normal.  4. The aortic valve is tricuspid. Mild thickening of the aortic valve. No stenosis of the aortic valve.  5. Technically difficult; definity used; akinesis of the apex with overall moderate to severe LV dysfunction; moderate diastolic dysfunction.  ASSESSMENT:    1. Coronary artery disease of native artery of native heart with stable angina pectoris ([facility]  2. Chronic combined systolic and diastolic heart failure ([facility]  3. CKD (chronic kidney disease), stage IV (HCL[facility]  4. Essential hypertension      PLAN:  In order of problems listed above:  1. Chronic combined systolic and diastolic heart failure: EF 30-35%.  On maximal Coreg due to baseline bradycardia and presence of Mobitz 1 heart block during the hospitalization.  On Entresto and aldactone. Unable to tolerate Bidil due to lightheadedness. Appears well compensated today.  2. CAD: s/p DES of LAD in 2018 in setting of anterior MI. S/p DES of RCA in January 2019. I reviewed her last cardiac cath in July 2019. She has extensive small distal  branch vessel disease in distal LAD (CTO), small diagonal, distal LCX, and 95% PL lesion. these are not amenable to PCI due to small vessel size. High risk Myoview in July 2019 with cardiac cath as noted. Medical management.  Agree with resumption of Imdur.   3. Hypertension: Blood pressure is well controlled today.   4. Hyperlipidemia: On Lipitor 80 mg daily.  Cholesterol was well controlled on recent lab work. LDL of 50.   5. DM2: On insulin, managed by primary care provider.    Medication Adjustments/Labs and Tests Ordered: Current medicines are  reviewed at length with the patient today.  Concerns regarding medicines are outlined above.  Medication changes, Labs and Tests ordered today are listed in the Patient Instructions below. There are no Patient Instructions on file for this visit.   Signed, Erryn Dickison [name], MD  01/16/2019 2:39 PM    [facility] [place], [place], [place]  [address] Phone: [phone]; Fax: [phone]

## 2019-01-14 ENCOUNTER — Observation Stay (HOSPITAL_COMMUNITY)
Admission: AD | Admit: 2019-01-14 | Discharge: 2019-01-14 | Disposition: A | Discharge status: Home / Self Care | Payer: Medicare Other | Source: Other Acute Inpatient Hospital | Attending: Internal Medicine | Admitting: Internal Medicine

## 2019-01-14 ENCOUNTER — Encounter (HOSPITAL_COMMUNITY): Payer: Self-pay

## 2019-01-14 ENCOUNTER — Other Ambulatory Visit: Payer: Self-pay

## 2019-01-14 DIAGNOSIS — R0789 Other chest pain: Secondary | ICD-10-CM | POA: Diagnosis present

## 2019-01-14 DIAGNOSIS — E1122 Type 2 diabetes mellitus with diabetic chronic kidney disease: Secondary | ICD-10-CM | POA: Diagnosis not present

## 2019-01-14 DIAGNOSIS — Z79899 Other long term (current) drug therapy: Secondary | ICD-10-CM | POA: Insufficient documentation

## 2019-01-14 DIAGNOSIS — R252 Cramp and spasm: Secondary | ICD-10-CM | POA: Insufficient documentation

## 2019-01-14 DIAGNOSIS — Z955 Presence of coronary angioplasty implant and graft: Secondary | ICD-10-CM | POA: Insufficient documentation

## 2019-01-14 DIAGNOSIS — I10 Essential (primary) hypertension: Secondary | ICD-10-CM | POA: Diagnosis present

## 2019-01-14 DIAGNOSIS — N189 Chronic kidney disease, unspecified: Secondary | ICD-10-CM | POA: Diagnosis not present

## 2019-01-14 DIAGNOSIS — I259 Chronic ischemic heart disease, unspecified: Secondary | ICD-10-CM

## 2019-01-14 DIAGNOSIS — Z794 Long term (current) use of insulin: Secondary | ICD-10-CM | POA: Insufficient documentation

## 2019-01-14 DIAGNOSIS — E1159 Type 2 diabetes mellitus with other circulatory complications: Secondary | ICD-10-CM | POA: Diagnosis not present

## 2019-01-14 DIAGNOSIS — E782 Mixed hyperlipidemia: Secondary | ICD-10-CM | POA: Diagnosis not present

## 2019-01-14 DIAGNOSIS — I2510 Atherosclerotic heart disease of native coronary artery without angina pectoris: Secondary | ICD-10-CM

## 2019-01-14 DIAGNOSIS — N184 Chronic kidney disease, stage 4 (severe): Secondary | ICD-10-CM | POA: Insufficient documentation

## 2019-01-14 DIAGNOSIS — I25110 Atherosclerotic heart disease of native coronary artery with unstable angina pectoris: Secondary | ICD-10-CM | POA: Diagnosis not present

## 2019-01-14 DIAGNOSIS — I130 Hypertensive heart and chronic kidney disease with heart failure and stage 1 through stage 4 chronic kidney disease, or unspecified chronic kidney disease: Secondary | ICD-10-CM | POA: Diagnosis not present

## 2019-01-14 DIAGNOSIS — Z8249 Family history of ischemic heart disease and other diseases of the circulatory system: Secondary | ICD-10-CM | POA: Diagnosis not present

## 2019-01-14 DIAGNOSIS — R079 Chest pain, unspecified: Principal | ICD-10-CM | POA: Insufficient documentation

## 2019-01-14 DIAGNOSIS — E785 Hyperlipidemia, unspecified: Secondary | ICD-10-CM | POA: Insufficient documentation

## 2019-01-14 DIAGNOSIS — Z7982 Long term (current) use of aspirin: Secondary | ICD-10-CM | POA: Diagnosis not present

## 2019-01-14 DIAGNOSIS — I255 Ischemic cardiomyopathy: Secondary | ICD-10-CM | POA: Diagnosis not present

## 2019-01-14 DIAGNOSIS — D649 Anemia, unspecified: Secondary | ICD-10-CM | POA: Insufficient documentation

## 2019-01-14 DIAGNOSIS — I5042 Chronic combined systolic (congestive) and diastolic (congestive) heart failure: Secondary | ICD-10-CM | POA: Diagnosis not present

## 2019-01-14 DIAGNOSIS — I252 Old myocardial infarction: Secondary | ICD-10-CM | POA: Insufficient documentation

## 2019-01-14 DIAGNOSIS — K219 Gastro-esophageal reflux disease without esophagitis: Secondary | ICD-10-CM | POA: Insufficient documentation

## 2019-01-14 DIAGNOSIS — E119 Type 2 diabetes mellitus without complications: Secondary | ICD-10-CM | POA: Diagnosis present

## 2019-01-14 DIAGNOSIS — I208 Other forms of angina pectoris: Secondary | ICD-10-CM

## 2019-01-14 LAB — BASIC METABOLIC PANEL
Anion gap: 11 (ref 5–15)
BUN: 25 mg/dL — ABNORMAL HIGH (ref 6–20)
CO2: 22 mmol/L (ref 22–32)
Calcium: 8.9 mg/dL (ref 8.9–10.3)
Chloride: 108 mmol/L (ref 98–111)
Creatinine, Ser: 1.71 mg/dL — ABNORMAL HIGH (ref 0.44–1.00)
GFR calc Af Amer: 37 mL/min — ABNORMAL LOW (ref >60)
GFR calc non Af Amer: 32 mL/min — ABNORMAL LOW (ref >60)
Glucose, Bld: 265 mg/dL — ABNORMAL HIGH (ref 70–99)
Potassium: 4.1 mmol/L (ref 3.5–5.1)
Sodium: 141 mmol/L (ref 135–145)

## 2019-01-14 LAB — GLUCOSE, CAPILLARY
Glucose-Capillary: 236 mg/dL — ABNORMAL HIGH (ref 70–99)
Glucose-Capillary: 283 mg/dL — ABNORMAL HIGH (ref 70–99)
Glucose-Capillary: 137 mg/dL — ABNORMAL HIGH (ref 70–99)
Glucose-Capillary: 155 mg/dL — ABNORMAL HIGH (ref 70–99)

## 2019-01-14 LAB — LIPID PANEL
Cholesterol: 100 mg/dL (ref 0–200)
HDL: 32 mg/dL — ABNORMAL LOW (ref >40)
LDL Cholesterol: 50 mg/dL (ref 0–99)
Total CHOL/HDL Ratio: 3.1 RATIO
Triglycerides: 92 mg/dL (ref <150)
VLDL: 18 mg/dL (ref 0–40)

## 2019-01-14 LAB — CBC
HCT: 30.8 % — ABNORMAL LOW (ref 36.0–46.0)
Hemoglobin: 9.6 g/dL — ABNORMAL LOW (ref 12.0–15.0)
MCH: 27.8 pg (ref 26.0–34.0)
MCHC: 31.2 g/dL (ref 30.0–36.0)
MCV: 89.3 fL (ref 80.0–100.0)
Platelets: 197 10*3/uL (ref 150–400)
RBC: 3.45 MIL/uL — ABNORMAL LOW (ref 3.87–5.11)
RDW: 13.3 % (ref 11.5–15.5)
WBC: 7 10*3/uL (ref 4.0–10.5)
nRBC: 0 % (ref 0.0–0.2)

## 2019-01-14 LAB — HEMOGLOBIN A1C
Hgb A1c MFr Bld: 7.7 % — ABNORMAL HIGH (ref 4.8–5.6)
Mean Plasma Glucose: 174.29 mg/dL

## 2019-01-14 LAB — TSH: TSH: 2.377 u[IU]/mL (ref 0.350–4.500)

## 2019-01-14 LAB — TROPONIN I (HIGH SENSITIVITY)
Troponin I (High Sensitivity): 10 ng/L (ref <18)
Troponin I (High Sensitivity): 10 ng/L (ref <18)

## 2019-01-14 LAB — HIV ANTIBODY (ROUTINE TESTING W REFLEX): HIV Screen 4th Generation wRfx: NONREACTIVE

## 2019-01-14 LAB — BRAIN NATRIURETIC PEPTIDE: B Natriuretic Peptide: 151.3 pg/mL — ABNORMAL HIGH (ref 0.0–100.0)

## 2019-01-14 MED ORDER — ASPIRIN 81 MG PO CHEW
81.0000 mg | CHEWABLE_TABLET | Freq: Every day | ORAL | Status: DC
  Administered 2019-01-14: 81 mg via ORAL
  Filled 2019-01-14: qty 1

## 2019-01-14 MED ORDER — SPIRONOLACTONE 25 MG PO TABS
25.0000 mg | ORAL_TABLET | Freq: Every evening | ORAL | Status: DC
  Administered 2019-01-14: 25 mg via ORAL
  Filled 2019-01-14: qty 1

## 2019-01-14 MED ORDER — CARVEDILOL 12.5 MG PO TABS
12.5000 mg | ORAL_TABLET | Freq: Two times a day (BID) | ORAL | Status: DC
  Administered 2019-01-14 (×2): 12.5 mg via ORAL
  Filled 2019-01-14 (×2): qty 1

## 2019-01-14 MED ORDER — PANTOPRAZOLE SODIUM 40 MG PO TBEC
40.0000 mg | DELAYED_RELEASE_TABLET | Freq: Two times a day (BID) | ORAL | Status: DC
  Administered 2019-01-14: 40 mg via ORAL
  Filled 2019-01-14: qty 1

## 2019-01-14 MED ORDER — GABAPENTIN 300 MG PO CAPS: 600.0000 mg | ORAL_CAPSULE | Freq: Every day | ORAL | Status: DC

## 2019-01-14 MED ORDER — TICAGRELOR 60 MG PO TABS
60.0000 mg | ORAL_TABLET | Freq: Two times a day (BID) | ORAL | Status: DC
  Administered 2019-01-14: 60 mg via ORAL
  Filled 2019-01-14 (×2): qty 1

## 2019-01-14 MED ORDER — ATORVASTATIN CALCIUM 80 MG PO TABS
80.0000 mg | ORAL_TABLET | Freq: Every day | ORAL | Status: DC
  Administered 2019-01-14: 17:00:00 80 mg via ORAL
  Filled 2019-01-14: qty 1

## 2019-01-14 MED ORDER — TIZANIDINE HCL 4 MG PO TABS
4.0000 mg | ORAL_TABLET | Freq: Every evening | ORAL | Status: DC | PRN
  Filled 2019-01-14: qty 1

## 2019-01-14 MED ORDER — SACUBITRIL-VALSARTAN 97-103 MG PO TABS
1.0000 | ORAL_TABLET | Freq: Two times a day (BID) | ORAL | Status: DC
  Administered 2019-01-14: 1 via ORAL
  Filled 2019-01-14 (×2): qty 1

## 2019-01-14 MED ORDER — INSULIN ASPART 100 UNIT/ML ~~LOC~~ SOLN
0.0000 [IU] | Freq: Three times a day (TID) | SUBCUTANEOUS | Status: DC
  Administered 2019-01-14: 8 [IU] via SUBCUTANEOUS
  Administered 2019-01-14: 3 [IU] via SUBCUTANEOUS
  Administered 2019-01-14: 2 [IU] via SUBCUTANEOUS

## 2019-01-14 MED ORDER — NITROGLYCERIN 0.4 MG SL SUBL: 0.4000 mg | SUBLINGUAL_TABLET | SUBLINGUAL | Status: DC | PRN

## 2019-01-14 MED ORDER — ADULT MULTIVITAMIN W/MINERALS CH
1.0000 | ORAL_TABLET | Freq: Every day | ORAL | Status: DC
  Administered 2019-01-14: 1 via ORAL
  Filled 2019-01-14: qty 1

## 2019-01-14 MED ORDER — ACETAMINOPHEN 325 MG PO TABS: 650.0000 mg | ORAL_TABLET | ORAL | Status: DC | PRN

## 2019-01-14 MED ORDER — INSULIN DETEMIR 100 UNIT/ML ~~LOC~~ SOLN
40.0000 [IU] | Freq: Every day | SUBCUTANEOUS | Status: DC
  Administered 2019-01-14: 40 [IU] via SUBCUTANEOUS
  Filled 2019-01-14: qty 0.4

## 2019-01-14 MED ORDER — FERROUS SULFATE 300 (60 FE) MG/5ML PO SYRP
300.0000 mg | ORAL_SOLUTION | Freq: Every day | ORAL | Status: DC
  Administered 2019-01-14: 300 mg via ORAL
  Filled 2019-01-14 (×2): qty 5

## 2019-01-14 MED ORDER — ISOSORBIDE MONONITRATE ER 30 MG PO TB24
30.0000 mg | ORAL_TABLET | Freq: Every day | ORAL | Status: DC
  Administered 2019-01-14: 30 mg via ORAL
  Filled 2019-01-14: qty 1

## 2019-01-14 MED ORDER — ONDANSETRON HCL 4 MG/2ML IJ SOLN: 4.0000 mg | Freq: Four times a day (QID) | INTRAMUSCULAR | Status: DC | PRN

## 2019-01-14 MED ORDER — ISOSORBIDE MONONITRATE ER 30 MG PO TB24: 30.0000 mg | ORAL_TABLET | Freq: Every day | ORAL | 6 refills | Status: DC

## 2019-01-14 NOTE — Plan of Care (Signed)
  Problem: Education: Goal: Understanding of CV disease, CV risk reduction, and recovery process will improve Outcome: Progressing   Problem: Activity: Goal: Ability to return to baseline activity level will improve Outcome: Progressing   Problem: Cardiovascular: Goal: Ability to achieve and maintain adequate cardiovascular perfusion will improve Outcome: Progressing   

## 2019-01-14 NOTE — Progress Notes (Signed)
Progress Note  Patient Name: Stephanie Manning Date of Encounter: 01/14/2019  Primary Cardiologist: Peter [name], MD   Subjective   No chest pain or sob.   Inpatient Medications    Scheduled Meds: . aspirin  81 mg Oral Daily  . atorvastatin  80 mg Oral q1800  . carvedilol  12.5 mg Oral BID WC  . ferrous sulfate  300 mg Oral Q breakfast  . gabapentin  600 mg Oral QHS  . insulin aspart  0-15 Units Subcutaneous TID WC  . insulin detemir  40 Units Subcutaneous QHS  . isosorbide mononitrate  30 mg Oral Daily  . multivitamin with minerals  1 tablet Oral Daily  . pantoprazole  40 mg Oral BID  . sacubitril-valsartan  1 tablet Oral BID  . spironolactone  25 mg Oral QPM  . ticagrelor  60 mg Oral BID   Continuous Infusions:  PRN Meds: acetaminophen, ondansetron (ZOFRAN) IV, tiZANidine   Vital Signs    Vitals:   01/14/19 0252 01/14/19 0721 01/14/19 1134  BP: (!) 146/76 125/70 121/72  Pulse: 65  62  Resp: 18    Temp: 97.8 F (36.6 C)  98.3 F (36.8 C)  TempSrc: Oral  Oral  SpO2: 100%  100%  Weight: 109.1 kg    Height: 5\' 8"  (1.727 m)     No intake or output data in the 24 hours ending 01/14/19 1200 Filed Weights   01/14/19 0252  Weight: 109.1 kg    Telemetry    nsr - Personally Reviewed  ECG    nsr - Personally Reviewed  Physical Exam   GEN: No acute distress.   Neck: No JVD Cardiac: RRR, no murmurs, rubs, or gallops.  Respiratory: Clear to auscultation bilaterally. GI: Soft, nontender, non-distended  MS: No edema; No deformity. Neuro:  Nonfocal  Psych: Normal affect   Labs    Chemistry Recent Labs  Lab 01/14/19 0433  NA 141  K 4.1  CL 108  CO2 22  GLUCOSE 265*  BUN 25*  CREATININE 1.71*  CALCIUM 8.9  GFRNONAA 32*  GFRAA 37*  ANIONGAP 11     Hematology Recent Labs  Lab 01/14/19 1001  WBC 7.0  RBC 3.45*  HGB 9.6*  HCT 30.8*  MCV 89.3  MCH 27.8  MCHC 31.2  RDW 13.3  PLT 197    Cardiac EnzymesNo results for input(s):  TROPONINI in the last 168 hours. No results for input(s): TROPIPOC in the last 168 hours.   BNP Recent Labs  Lab 01/14/19 1001  BNP 151.3*     DDimer No results for input(s): DDIMER in the last 168 hours.   Radiology    No results found.  Cardiac Studies   Enzymes are negative  Patient Profile     58 y.o. female admitted with chest pain, ruled out for MI.   Assessment & Plan    1. Chest pain - her symptoms have resolved and her enzymes and ECG look good. She will be discharged home. She has followup with Dr. Neita Garnet on Tuesday.  2. Dyslipidemia - she will continue high dose statin therapy.  [facility] will sign off.   Medication Recommendations:  Continue current meds Other recommendations (labs, testing, etc):  none Follow up as an outpatient:  followup as scheduled with Dr. [name]  For questions or updates, please contact [place] Please consult [email or web address] for contact info under Cardiology/STEMI.   Signed, Cristopher Peru, MD  01/14/2019, 12:00 PM  Patient ID: Stephanie Manning  Stephanie Manning, female   DOB: 1960-12-22, 58 y.o.   MRN: [id number]

## 2019-01-14 NOTE — Plan of Care (Signed)
  Problem: Education: Goal: Understanding of CV disease, CV risk reduction, and recovery process will improve Outcome: Progressing Goal: Individualized Educational Video(s) Outcome: Progressing   

## 2019-01-14 NOTE — H&P (Signed)
Cardiology Admission:   Patient ID: Stephanie Manning MRN: [id number]; DOB: [birth year]  Admit date: 01/14/2019 Date of Consult: 01/14/2019  Primary Care Provider: [name], MD Primary Cardiologist: Peter [name], MD  Primary Electrophysiologist:  None    Patient Profile:   Stephanie Manning is a 58 y.o. female with a hx of CAD, ICM, HTN, HLD, DM, CKD, and GERD who is being seen today for the evaluation of chest pain.  History of Present Illness:   Stephanie Manning is followed by Dr. [name] and Dr. [name] for her ischemic cardiomyopathy. She underwent a LHC in July 2019 at which point she was found to have a high grade lesion in a small PLV branch that was managed medically. She was continued on imdur and felt fairly well and ultimately switched to bidil. She had hypotension on bidil and stopped. She has been off of long-acting nitrates for a few months now.  She was in her usual state of health until around noon on 10/31. She was in her bathroom when she started to feel lightheaded and dizzy. She sat down due to concerns about passing out. She notes some tingling in her right finger tips that radiated up her arm into her right should and central chest, similar to her prior heart attacks. She took one nitro that helped relieve this sensation to some degree. She denies having any abdominal pain, nausea, shortness of breath or palpitations. As her symptoms continued, she presented to her local ER where she was given an additional nitro dose with some relief. She states that her pain is better not but still with some persistent tingling in her right fingers into her shoulder. She is generally pretty active on a day to day basis and has not had any chest pain or shortness of breath with activities prior to today. She denies any current shortness of breath, lightheadedness, dizziness, lower leg swelling, orthopnea, PND, palpitations, abdominal pain, diarrhea, constipation, fevers or chills.    Heart Pathway Score:     Past Medical History:  Diagnosis Date  . AKI (acute kidney injury) ([place]) 02/2017  . CAD in native artery    a. NSTEMI 02/2017 s/p DES to mLAD, diffuse disease otherwise, EF 30-35%. b. Staged cath 03/2017 - patent stent in the proximal to mid LAD. CTO of the distal LAD, 90% diffuse small first diagonal, 90% distal LCx/OM, 80% diffuse prox RCA, moderately elevated LVEDP 38mHg, s/p DES to mRCA, residual disease treated medically (too small for PCI), EF 30-35%.  . Chronic combined systolic and diastolic CHF (congestive heart failure) ([place]   . CKD (chronic kidney disease), stage IV ([facility]    /[name]2/09/2017  . Diabetes mellitus, type II, insulin dependent ([facility]   . GERD (gastroesophageal reflux disease)   . History of blood transfusion    "related to menses"  . History of stomach ulcers 2017  . Hyperlipidemia   . Hypertension   . Iron deficiency anemia 03/2017   "had to have iron infusions" (10/10/2017)  . Ischemic cardiomyopathy   . STEMI (ST elevation myocardial infarction) ([place] 02/2017    Past Surgical History:  Procedure Laterality Date  . CESAREAN SECTION  1987  . CORONARY ANGIOPLASTY WITH STENT PLACEMENT  04/01/2017   drug-eluting stent was successfully placed using a STENT SYNERGY DES [id number] . CORONARY STENT INTERVENTION N/A 04/01/2017   Procedure: CORONARY STENT INTERVENTION;  Surgeon: JMartinique [name], MD;  [facility];  Service: Cardiovascular;  Laterality: N/A;  .  CORONARY/GRAFT ACUTE MI REVASCULARIZATION N/A 03/10/2017   Procedure: Coronary/Graft Acute MI Revascularization;  Surgeon: [name], [name], MD;  Location: [place] [facility];  Service: Cardiovascular;  Laterality: N/A;  . LEFT HEART CATH AND CORONARY ANGIOGRAPHY N/A 03/10/2017   Procedure: LEFT HEART CATH AND CORONARY ANGIOGRAPHY;  Surgeon: [name], [name], MD;  Location: [place] [facility];  Service: Cardiovascular;  Laterality: N/A;  . LEFT HEART CATH AND CORONARY  ANGIOGRAPHY N/A 04/01/2017   Procedure: LEFT HEART CATH AND CORONARY ANGIOGRAPHY;  Surgeon: [name], [name], MD;  Location: [place] [facility];  Service: Cardiovascular;  Laterality: N/A;  . LEFT HEART CATH AND CORONARY ANGIOGRAPHY N/A 10/12/2017   Procedure: LEFT HEART CATH AND CORONARY ANGIOGRAPHY;  Surgeon: Sherren Mocha, MD;  Location: [place] [facility];  Service: Cardiovascular;  Laterality: N/A;  . OOPHORECTOMY     "1 ovary removed"  . THROAT SURGERY  02/2014   "nodule in my throat removed; not related to thyroid"     Home Medications:  Prior to Admission medications   Medication Sig Start Date End Date Taking? Authorizing Provider  Accu-Chek Softclix Lancets lancets Use as instructed 12/08/18   [name], MD  aspirin 81 MG chewable tablet Chew 1 tablet (81 mg total) by mouth daily. 03/17/17   Cheryln Manly, NP  atorvastatin (LIPITOR) 80 MG tablet Take 1 tablet (80 mg total) by mouth daily at 6 PM. NEEDS APPOINTMENT WITH DR [name] FOR FUTURE REFILLS 10/06/18   [name], [name], MD  blood glucose meter kit and supplies Dispense based on patient and insurance preference. Use up to four times daily as directed. (FOR ICD-10 E11.65) Pt prefers One touch mini 11/09/18   [name], MD  BRILINTA 60 MG TABS tablet TAKE ONE TABLET BY MOUTH TWICE A DAY 06/02/18   [name], MD  carvedilol (COREG) 12.5 MG tablet Take 1 tablet (12.5 mg total) by mouth 2 (two) times daily with a meal. 10/23/18   [name], MD  ELDERBERRY PO Take by mouth daily.    [provider]  ferrous sulfate 300 (60 Fe) MG/5ML syrup Take 300 mg by mouth daily with breakfast.    [provider]  gabapentin (NEURONTIN) 300 MG capsule Take 600 mg by mouth at bedtime. 02/08/17   [provider]  glucose blood (ACCU-CHEK GUIDE) test strip TEST 4 TIMES DAILY BEFORE MEALS AND AT BEDTIME Dx: E11.59 12/14/18   [name], MD  insulin aspart (NOVOLOG FLEXPEN) 100  UNIT/ML FlexPen Inject 6-12 Units into the skin 3 (three) times daily before meals.    [provider]  Insulin Detemir (LEVEMIR) 100 UNIT/ML Pen Inject 40 Units into the skin at bedtime.    [provider]  Multiple Vitamin (MULTIVITAMIN WITH MINERALS) TABS tablet Take 1 tablet by mouth daily.    [provider]  nitroGLYCERIN (NITROSTAT) 0.4 MG SL tablet DISSOLVE 1 TAB UNDER TONGUE FOR CHEST PAIN - IF PAIN REMAINS AFTER 5 MIN, CALL 911 AND REPEAT DOSE. MAX 3 TABS IN 15 MINUTES 11/22/18   Kroeger, Daleen Snook M., PA-C  pantoprazole (PROTONIX) 40 MG tablet Take 1 tablet (40 mg total) by mouth 2 (two) times daily. 12/06/18   Croitoru, Mihai, MD  sacubitril-valsartan (ENTRESTO) 97-103 MG Take 1 tablet by mouth 2 (two) times daily. 12/08/18   [name], MD  Saline 0.2 % SOLN Place 1 spray into both nostrils as needed (congestion).     [provider]  spironolactone (ALDACTONE) 25 MG tablet Take 1 tablet (25 mg total) by mouth every evening. 11/13/18   [name], MD  tiZANidine (ZANAFLEX) 4 MG tablet Take 4 mg by mouth at bedtime.  02/08/17   [provider]    Inpatient Medications: Scheduled Meds:  Continuous Infusions:  PRN Meds:   Allergies:    Allergies  Allergen Reactions  . Bidil [Isosorb Dinitrate-Hydralazine]   . Amoxicillin Other (See Comments)    Yeast infection during treatment  . Hydrocodone Palpitations, Rash and Other (See Comments)    GI upset, also  . Lisinopril Palpitations and Other (See Comments)    Chest pain, too  . Meloxicam Nausea Only  . Strawberry Extract Hives    Does not always happen  . Tape Rash    Some tapes cause rashes  . Toradol [Ketorolac Tromethamine] Palpitations    Social History:   Social History   Socioeconomic History  . Marital status: Widowed    Spouse name: Not on file  . Number of children: 2  . Years of education: Not on file  . Highest education level: Not on file  Occupational  History  . Not on file  Social Needs  . Financial resource strain: Not on file  . Food insecurity    Worry: Not on file    Inability: Not on file  . Transportation needs    Medical: Not on file    Non-medical: Not on file  Tobacco Use  . Smoking status: Never Smoker  . Smokeless tobacco: Never Used  Substance and Sexual Activity  . Alcohol use: Never    Frequency: Never  . Drug use: Never  . Sexual activity: Yes  Lifestyle  . Physical activity    Days per week: Not on file    Minutes per session: Not on file  . Stress: Not on file  Relationships  . Social Herbalist on phone: Not on file    Gets together: Not on file    Attends religious service: Not on file    Active member of club or organization: Not on file    Attends meetings of clubs or organizations: Not on file    Relationship status: Not on file  . Intimate partner violence    Fear of current or ex partner: Not on file    Emotionally abused: Not on file    Physically abused: Not on file    Forced sexual activity: Not on file  Other Topics Concern  . Not on file  Social History Narrative  . Not on file    Family History:   Family History  Problem Relation Age of Onset  . Hypertension Mother   . Heart disease Mother   . Diabetes Mother   . Stroke Mother      ROS:  Please see the history of present illness.  All other ROS reviewed and negative.     Physical Exam/Data:   Vitals:   01/14/19 0252  BP: (!) 146/76  Pulse: 65  Resp: 18  Temp: 97.8 F (36.6 C)  TempSrc: Oral  SpO2: 100%  Weight: 109.1 kg  Height: '5\' 8"'  (1.727 m)   No intake or output data in the 24 hours ending 01/14/19 0353 Last 3 Weights 01/14/2019 12/08/2018 12/06/2018  Weight (lbs) 240 lb 8.4 oz 241 lb 9.6 oz 243 lb  Weight (kg) 109.1 kg 109.589 kg 110.224 kg     Body mass index is 36.57 kg/m.  General:  Well nourished, well developed, in no acute distress HEENT: normal Neck: no JVD Cardiac:  normal S1, S2; RRR;  no murmurs, rubs or gallops Lungs:  clear to auscultation bilaterally, no wheezing, rhonchi or rales  Abd: soft, nontender, no hepatomegaly  Ext: no edema Musculoskeletal:  No deformities, BUE and BLE strength normal and equal Skin: warm and dry  Neuro:  CNs 2-12 intact, no focal abnormalities noted Psych:  Normal affect   EKG:  The EKG was personally reviewed and demonstrates:  Ordered Telemetry:  Telemetry was personally reviewed and demonstrates:  Sinus rhythm   Relevant CV Studies: Cath Results - Anterior MI in 12/18 with DES to 100% stenosed mid LAD (culprit), also with CTO of distal LAD, 80% pRCA, 85% mLCx/90% dLCx, 90% OM3.  - 1/19 staged PCI of proximal RCA.  - Admitted with unstable angina 7/19. LHC: 75% stenosis proximal-mid LCx, 60% OM1, total occlusion of the distal LAD, patent mid LAD stent, patent RCA stents, 95% stenosis in small PLV branch (likely culprit).   Echo 6/20  1. The left ventricle has moderate-severely reduced systolic function, with an ejection fraction of 30-35%. The cavity size was mildly dilated. Left ventricular diastolic Doppler parameters are consistent with pseudonormalization.  2. The right ventricle has normal systolic function. The cavity was normal.  3. The mitral valve is grossly normal.  4. The aortic valve is tricuspid. Mild thickening of the aortic valve. No stenosis of the aortic valve.  5. Technically difficult; definity used; akinesis of the apex with overall moderate to severe LV dysfunction; moderate diastolic dysfunction.  Laboratory Data:  High Sensitivity Troponin:  No results for input(s): TROPONINIHS in the last 720 hours.   ChemistryNo results for input(s): NA, K, CL, CO2, GLUCOSE, BUN, CREATININE, CALCIUM, GFRNONAA, GFRAA, ANIONGAP in the last 168 hours.  No results for input(s): PROT, ALBUMIN, AST, ALT, ALKPHOS, BILITOT in the last 168 hours. HematologyNo results for input(s): WBC, RBC, HGB, HCT, MCV, MCH, MCHC, RDW, PLT in the  last 168 hours. BNPNo results for input(s): BNP, PROBNP in the last 168 hours.  DDimer No results for input(s): DDIMER in the last 168 hours.   Radiology/Studies:  No results found.  Assessment and Plan:   Kainat Pizana is a 58 y.o. female with a hx of CAD, ICM, HTN, HLD, DM, CKD, and GERD who is being seen today for the evaluation of chest pain.   CAD with Stable Angina- She has known, significant CAD with unrevascularizable PLV disease. She appears well on exam with multiple negative troponins at the OSH. She has been off of a long-acting nitrate for several months and may get relief with reinitiation of imdur alone. If she continues to have significant pain or has an elevation in her troponin, we may discuss repeat catheterization. - Serial troponins - S/p imdur 80m at OSH, continue daily dosing - Continue aspirin 859mdaily - Continue lipitor 8040m- Continue home brilinta 80m47mD - Continue carvedilol 12.5mg 26m - Continue home spironolactone and entresto - Check TSH, HgA1c and lipid panel  Diabetes - Continue home levemir 40 units nightly  - Continue sliding scale insulin  Leg Cramps - Continue home zanaflex PRN at bedtime and gabapentin    For questions or updates, please contact [facility] Please consult [email or web address] for contact info under     Signed, ConigPrincella Pellegrini 01/14/2019 3:53 AM

## 2019-01-14 NOTE — Discharge Instructions (Signed)
Keep appt with Dr. [name].   Heart Healthy diabetic diet low salt  Take your imdur

## 2019-01-14 NOTE — Discharge Summary (Addendum)
Discharge Summary    Patient ID: Stephanie Manning MRN: [id number]; DOB: 12/09/1960  Admit date: 01/14/2019 Discharge date: 01/14/2019  Primary Care Provider: [name], MD  Primary Cardiologist: Peter [name], MD  Primary Electrophysiologist:  None   Discharge Diagnoses    Principal Problem:   Chest pain, neg troponins and resolution of symptoms Active Problems:   DM type 2 causing vascular disease ([place])   Essential hypertension, benign   Mixed hyperlipidemia   Coronary artery disease involving native coronary artery of native heart with unstable angina pectoris [facility])    Diagnostic Studies/Procedures    none _____________   History of Present Illness     Stephanie Manning is a 58 y.o. female with a hx of CAD, ICM, HTN, HLD, DM, CKD, and GERD presented to ER 01/14/19.  She is followed by Dr. [name] and Dr. [name] for CAD and ischemic CM.   She underwent a LHC in July 2019 at which point she was found to have a high grade lesion in a small PLV branch that was managed medically. She was continued on imdur and felt fairly well and ultimately switched to bidil. She had hypotension on bidil and stopped. She has been off of long-acting nitrates for a few months now.  She was feeling well until noon 01/13/19 and she became lightheaded and dizzy.  She had some tingling in her rt finger tips that radiated to her rt arm and rt shoulder and into central chest.  it reminded her of prior MIs  1 NTG helped somewhat.  No associated symptoms of nausea SOB or palpitations.  She went to ER for ongoing symptoms.    No acute EKG changes, labs stable including troponin, admitted for monitoring and serial troponins.     Hospital Course     Consultants: none   Today pt is back on imdur here.  Her troponins 10 X 2, BNP 151. Hgb 9.6 Cr 1.71 and K+ 4.1  tchol 100, HDL 32 and LDL 50  Hgb A1c 7.7.  She has been seen and evaluated by Dr. Lovena Le and is stable for discharge back on imdur.  She  has follow up 01/16/19 with Dr. [name].  Has chronic anemia. EKG without acute changes.    Did the patient have an acute coronary syndrome (MI, NSTEMI, STEMI, etc) this admission?:  No                               Did the patient have a percutaneous coronary intervention (stent / angioplasty)?:  No.   _____________  Discharge Vitals Blood pressure 121/72, pulse 62, temperature 98.3 F (36.8 C), temperature source Oral, resp. rate 18, height '5\' 8"'  (1.727 m), weight 109.1 kg, SpO2 100 %.  Filed Weights   01/14/19 0252  Weight: 109.1 kg    Labs & Radiologic Studies    CBC Recent Labs    01/14/19 1001  WBC 7.0  HGB 9.6*  HCT 30.8*  MCV 89.3  PLT 517   Basic Metabolic Panel Recent Labs    01/14/19 0433  NA 141  K 4.1  CL 108  CO2 22  GLUCOSE 265*  BUN 25*  CREATININE 1.71*  CALCIUM 8.9   Liver Function Tests No results for input(s): AST, ALT, ALKPHOS, BILITOT, PROT, ALBUMIN in the last 72 hours. No results for input(s): LIPASE, AMYLASE in the last 72 hours. High Sensitivity Troponin:   Recent Labs  Lab  01/14/19 0433 01/14/19 1001  TROPONINIHS 10 10    BNP Invalid input(s): POCBNP D-Dimer No results for input(s): DDIMER in the last 72 hours. Hemoglobin A1C Recent Labs    01/14/19 1001  HGBA1C 7.7*   Fasting Lipid Panel Recent Labs    01/14/19 0433  CHOL 100  HDL 32*  LDLCALC 50  TRIG 92  CHOLHDL 3.1   Thyroid Function Tests No results for input(s): TSH, T4TOTAL, T3FREE, THYROIDAB in the last 72 hours.  Invalid input(s): FREET3 _____________  No results found. Disposition   Pt is being discharged home today in good condition.  Follow-up Plans & Appointments   Keep appt with Dr. [name].   Heart Healthy diabetic diet.   low salt  Take your imdur     Follow-up Information    [name], [name], MD Follow up on 01/16/2019.   Specialty: Cardiology Why: at 2:20 PM  Contact information: [address] [place] [address]  [phone]            Discharge Medications   Allergies as of 01/14/2019      Reactions   Bidil [isosorb Dinitrate-hydralazine]    Amoxicillin Other (See Comments)   Yeast infection during treatment   Hydrocodone Palpitations, Rash, Other (See Comments)   GI upset, also   Lisinopril Palpitations, Other (See Comments)   Chest pain, too   Meloxicam Nausea Only   Strawberry Extract Hives   Does not always happen   Tape Rash   Some tapes cause rashes   Toradol [ketorolac Tromethamine] Palpitations      Medication List    TAKE these medications   Accu-Chek Guide test strip Generic drug: glucose blood TEST 4 TIMES DAILY BEFORE MEALS AND AT BEDTIME Dx: E11.59   Accu-Chek Softclix Lancets lancets Use as instructed   aspirin 81 MG chewable tablet Chew 1 tablet (81 mg total) by mouth daily.   atorvastatin 80 MG tablet Commonly known as: LIPITOR Take 1 tablet (80 mg total) by mouth daily at 6 PM. NEEDS APPOINTMENT WITH DR [name] FOR FUTURE REFILLS   blood glucose meter kit and supplies Dispense based on patient and insurance preference. Use up to four times daily as directed. (FOR ICD-10 E11.65) Pt prefers One touch mini   Brilinta 60 MG Tabs tablet Generic drug: ticagrelor TAKE ONE TABLET BY MOUTH TWICE A DAY   carvedilol 12.5 MG tablet Commonly known as: COREG Take 1 tablet (12.5 mg total) by mouth 2 (two) times daily with a meal.   ELDERBERRY PO Take by mouth daily.   ferrous sulfate 300 (60 Fe) MG/5ML syrup Take 300 mg by mouth daily with breakfast.   gabapentin 300 MG capsule Commonly known as: NEURONTIN Take 600 mg by mouth at bedtime.   Insulin Detemir 100 UNIT/ML Pen Commonly known as: LEVEMIR Inject 40 Units into the skin at bedtime.   isosorbide mononitrate 30 MG 24 hr tablet Commonly known as: IMDUR Take 1 tablet (30 mg total) by mouth daily. Start taking on: January 15, 2019   multivitamin with minerals Tabs tablet Take 1 tablet by mouth  daily.   nitroGLYCERIN 0.4 MG SL tablet Commonly known as: NITROSTAT DISSOLVE 1 TAB UNDER TONGUE FOR CHEST PAIN - IF PAIN REMAINS AFTER 5 MIN, CALL 911 AND REPEAT DOSE. MAX 3 TABS IN 15 MINUTES   NovoLOG FlexPen 100 UNIT/ML FlexPen Generic drug: insulin aspart Inject 6-12 Units into the skin 3 (three) times daily before meals.   pantoprazole 40 MG tablet  Commonly known as: PROTONIX Take 1 tablet (40 mg total) by mouth 2 (two) times daily.   sacubitril-valsartan 97-103 MG Commonly known as: ENTRESTO Take 1 tablet by mouth 2 (two) times daily.   Saline 0.2 % Soln Place 1 spray into both nostrils as needed (congestion).   spironolactone 25 MG tablet Commonly known as: ALDACTONE Take 1 tablet (25 mg total) by mouth every evening.   tiZANidine 4 MG tablet Commonly known as: ZANAFLEX Take 4 mg by mouth at bedtime.          Outstanding Labs/Studies     Duration of Discharge Encounter   Greater than 30 minutes including physician time.  Signed, Cecilie Kicks, NP 01/14/2019, 2:00 PM   EP Attending  Patient seen and examined. Agree with the findings as noted above. The patient is doing well since admit with no chest pain and negative HS troponin. She will be discharged home with followup with Dr. Shirlee More which has already been scheduled.  Mikle Bosworth.D.

## 2019-01-14 NOTE — Progress Notes (Signed)
Patient had uneventful day, patient ambulated in room and is eating lunch.  Patient is looking forward to discharge.

## 2019-01-16 ENCOUNTER — Encounter: Payer: Self-pay | Admitting: Cardiology

## 2019-01-16 ENCOUNTER — Ambulatory Visit (INDEPENDENT_AMBULATORY_CARE_PROVIDER_SITE_OTHER): Payer: Medicare Other | Admitting: Cardiology

## 2019-01-16 ENCOUNTER — Other Ambulatory Visit: Payer: Self-pay

## 2019-01-16 VITALS — BP 128/72 | HR 72 | Ht 68.5 in | Wt 245.0 lb

## 2019-01-16 DIAGNOSIS — I25118 Atherosclerotic heart disease of native coronary artery with other forms of angina pectoris: Secondary | ICD-10-CM | POA: Diagnosis not present

## 2019-01-16 DIAGNOSIS — I5042 Chronic combined systolic (congestive) and diastolic (congestive) heart failure: Secondary | ICD-10-CM

## 2019-01-16 DIAGNOSIS — I10 Essential (primary) hypertension: Secondary | ICD-10-CM | POA: Diagnosis not present

## 2019-01-16 DIAGNOSIS — N184 Chronic kidney disease, stage 4 (severe): Secondary | ICD-10-CM

## 2019-01-16 DIAGNOSIS — I25110 Atherosclerotic heart disease of native coronary artery with unstable angina pectoris: Secondary | ICD-10-CM | POA: Diagnosis not present

## 2019-01-16 MED ORDER — SPIRONOLACTONE 25 MG PO TABS: 25.0000 mg | ORAL_TABLET | Freq: Every evening | ORAL | 3 refills | Status: DC

## 2019-01-16 MED ORDER — ATORVASTATIN CALCIUM 80 MG PO TABS: 80.0000 mg | ORAL_TABLET | Freq: Every day | ORAL | 3 refills | Status: DC

## 2019-01-16 MED ORDER — SACUBITRIL-VALSARTAN 97-103 MG PO TABS: 1.0000 | ORAL_TABLET | Freq: Two times a day (BID) | ORAL | 3 refills | Status: DC

## 2019-01-16 MED ORDER — ISOSORBIDE MONONITRATE ER 30 MG PO TB24: 30.0000 mg | ORAL_TABLET | Freq: Every day | ORAL | 3 refills | Status: DC

## 2019-02-22 ENCOUNTER — Other Ambulatory Visit: Payer: Self-pay

## 2019-02-22 MED ORDER — INSULIN DETEMIR 100 UNIT/ML FLEXPEN: 40.0000 [IU] | PEN_INJECTOR | Freq: Every day | SUBCUTANEOUS | 2 refills | Status: DC

## 2019-03-12 ENCOUNTER — Encounter: Payer: Self-pay | Admitting: "Endocrinology

## 2019-03-12 ENCOUNTER — Ambulatory Visit (INDEPENDENT_AMBULATORY_CARE_PROVIDER_SITE_OTHER): Payer: Medicare Other | Admitting: "Endocrinology

## 2019-03-12 ENCOUNTER — Other Ambulatory Visit: Payer: Self-pay

## 2019-03-12 VITALS — BP 144/76 | HR 68 | Ht 68.0 in | Wt 247.6 lb

## 2019-03-12 DIAGNOSIS — E1159 Type 2 diabetes mellitus with other circulatory complications: Secondary | ICD-10-CM | POA: Diagnosis not present

## 2019-03-12 DIAGNOSIS — I25110 Atherosclerotic heart disease of native coronary artery with unstable angina pectoris: Secondary | ICD-10-CM | POA: Diagnosis not present

## 2019-03-12 DIAGNOSIS — E782 Mixed hyperlipidemia: Secondary | ICD-10-CM

## 2019-03-12 DIAGNOSIS — I10 Essential (primary) hypertension: Secondary | ICD-10-CM | POA: Diagnosis not present

## 2019-03-12 MED ORDER — GLIPIZIDE ER 5 MG PO TB24: 5.0000 mg | ORAL_TABLET | Freq: Every day | ORAL | 3 refills | Status: DC

## 2019-03-12 MED ORDER — INSULIN DETEMIR 100 UNIT/ML FLEXPEN: 50.0000 [IU] | PEN_INJECTOR | Freq: Every day | SUBCUTANEOUS | 3 refills | Status: DC

## 2019-03-12 NOTE — Progress Notes (Signed)
03/12/2019, 8:38 PM                                                    Endocrinology Telehealth Visit Follow up Note -During COVID -19 Pandemic  This visit type was conducted due to national recommendations for restrictions regarding the COVID-19 Pandemic  in an effort to limit this patient's exposure and mitigate transmission of the corona virus.  Due to her co-morbid illnesses, Stephanie Manning is at  moderate to high risk for complications without adequate follow up.  This format is felt to be most appropriate for her at this time.  I connected with this patient on 03/12/2019   by telephone and verified that I am speaking with the correct person using two identifiers. Stephanie Manning, [birth year]. she has verbally consented to this visit. All issues noted in this document were discussed and addressed. The format was not optimal for physical exam.    Subjective:    Patient ID: Stephanie Manning, female    DOB: 25-Mar-1960.  Stephanie Manning is being engaged in telehealth via telephone on follow-up after she was seen in consultation for management of currently uncontrolled symptomatic diabetes requested by  [name], MD.   Past Medical History:  Diagnosis Date  . AKI (acute kidney injury) ([place]) 02/2017  . CAD in native artery    a. NSTEMI 02/2017 s/p DES to mLAD, diffuse disease otherwise, EF 30-35%. b. Staged cath 03/2017 - patent stent in the proximal to mid LAD. CTO of the distal LAD, 90% diffuse small first diagonal, 90% distal LCx/OM, 80% diffuse prox RCA, moderately elevated LVEDP 67mHg, s/p DES to mRCA, residual disease treated medically (too small for PCI), EF 30-35%.  . Chronic combined systolic and diastolic CHF (congestive heart failure) ([facility]   . CKD (chronic kidney disease), stage IV ([place]    /[name]2/09/2017  . Diabetes mellitus, type II, insulin dependent ([place]   . GERD  (gastroesophageal reflux disease)   . History of blood transfusion    "related to menses"  . History of stomach ulcers 2017  . Hyperlipidemia   . Hypertension   . Iron deficiency anemia 03/2017   "had to have iron infusions" (10/10/2017)  . Ischemic cardiomyopathy   . STEMI (ST elevation myocardial infarction) ([facility] 02/2017    Past Surgical History:  Procedure Laterality Date  . CESAREAN SECTION  1987  . CORONARY ANGIOPLASTY WITH STENT PLACEMENT  04/01/2017   drug-eluting stent was successfully placed using a STENT SYNERGY DES [id number] . CORONARY STENT INTERVENTION N/A 04/01/2017   Procedure: CORONARY STENT INTERVENTION;  Surgeon: JMartinique [name], MD;  [facility];  Service: Cardiovascular;  Laterality: N/A;  . CORONARY/GRAFT ACUTE MI REVASCULARIZATION N/A 03/10/2017   Procedure: Coronary/Graft Acute MI Revascularization;  Surgeon: JMartinique [name], MD;  [facility];  Service: Cardiovascular;  Laterality: N/A;  . LEFT HEART CATH AND CORONARY ANGIOGRAPHY N/A 03/10/2017  Procedure: LEFT HEART CATH AND CORONARY ANGIOGRAPHY;  Surgeon: [name], [name], MD;  Location: [facility];  Service: Cardiovascular;  Laterality: N/A;  . LEFT HEART CATH AND CORONARY ANGIOGRAPHY N/A 04/01/2017   Procedure: LEFT HEART CATH AND CORONARY ANGIOGRAPHY;  Surgeon: [name], [name], MD;  Location: [facility];  Service: Cardiovascular;  Laterality: N/A;  . LEFT HEART CATH AND CORONARY ANGIOGRAPHY N/A 10/12/2017   Procedure: LEFT HEART CATH AND CORONARY ANGIOGRAPHY;  Surgeon: Sherren Mocha, MD;  Location: [facility];  Service: Cardiovascular;  Laterality: N/A;  . OOPHORECTOMY     "1 ovary removed"  . THROAT SURGERY  02/2014   "nodule in my throat removed; not related to thyroid"    Social History   Socioeconomic History  . Marital status: Widowed    Spouse name: Not on file  . Number of children: 2  . Years of education: Not on file  . Highest  education level: Not on file  Occupational History  . Not on file  Tobacco Use  . Smoking status: Never Smoker  . Smokeless tobacco: Never Used  Substance and Sexual Activity  . Alcohol use: Never  . Drug use: Never  . Sexual activity: Yes  Other Topics Concern  . Not on file  Social History Narrative  . Not on file   Social Determinants of Health   Financial Resource Strain:   . Difficulty of Paying Living Expenses: Not on file  Food Insecurity:   . Worried About Charity fundraiser in the Last Year: Not on file  . Ran Out of Food in the Last Year: Not on file  Transportation Needs:   . Lack of Transportation (Medical): Not on file  . Lack of Transportation (Non-Medical): Not on file  Physical Activity:   . Days of Exercise per Week: Not on file  . Minutes of Exercise per Session: Not on file  Stress:   . Feeling of Stress : Not on file  Social Connections:   . Frequency of Communication with Friends and Family: Not on file  . Frequency of Social Gatherings with Friends and Family: Not on file  . Attends Religious Services: Not on file  . Active Member of Clubs or Organizations: Not on file  . Attends Archivist Meetings: Not on file  . Marital Status: Not on file    Family History  Problem Relation Age of Onset  . Hypertension Mother   . Heart disease Mother   . Diabetes Mother   . Stroke Mother     Outpatient Encounter Medications as of 03/12/2019  Medication Sig  . Accu-Chek Softclix Lancets lancets Use as instructed  . aspirin 81 MG chewable tablet Chew 1 tablet (81 mg total) by mouth daily.  Marland Kitchen atorvastatin (LIPITOR) 80 MG tablet Take 1 tablet (80 mg total) by mouth daily at 6 PM. NEEDS APPOINTMENT WITH DR [name] FOR FUTURE REFILLS  . blood glucose meter kit and supplies Dispense based on patient and insurance preference. Use up to four times daily as directed. (FOR ICD-10 E11.65) Pt prefers One touch mini  . BRILINTA 60 MG TABS tablet TAKE ONE  TABLET BY MOUTH TWICE A DAY (Patient taking differently: Take 60 mg by mouth 2 (two) times daily. )  . carvedilol (COREG) 12.5 MG tablet Take 1 tablet (12.5 mg total) by mouth 2 (two) times daily with a meal.  . ELDERBERRY PO Take 2 tablets by mouth every other day.   Marland Kitchen  ferrous sulfate 300 (60 Fe) MG/5ML syrup Take 300 mg by mouth daily with breakfast.  . gabapentin (NEURONTIN) 300 MG capsule Take 600 mg by mouth at bedtime.  Marland Kitchen glipiZIDE (GLUCOTROL XL) 5 MG 24 hr tablet Take 1 tablet (5 mg total) by mouth daily with breakfast.  . glucose blood (ACCU-CHEK GUIDE) test strip TEST 4 TIMES DAILY BEFORE MEALS AND AT BEDTIME Dx: E11.59  . Insulin Detemir (LEVEMIR) 100 UNIT/ML Pen Inject 50 Units into the skin at bedtime.  . isosorbide mononitrate (IMDUR) 30 MG 24 hr tablet Take 1 tablet (30 mg total) by mouth daily.  . Multiple Vitamin (MULTIVITAMIN WITH MINERALS) TABS tablet Take 1 tablet by mouth daily.  . nitroGLYCERIN (NITROSTAT) 0.4 MG SL tablet DISSOLVE 1 TAB UNDER TONGUE FOR CHEST PAIN - IF PAIN REMAINS AFTER 5 MIN, CALL 911 AND REPEAT DOSE. MAX 3 TABS IN 15 MINUTES (Patient taking differently: Place 0.4 mg under the tongue every 5 (five) minutes as needed for chest pain. )  . pantoprazole (PROTONIX) 40 MG tablet Take 1 tablet (40 mg total) by mouth 2 (two) times daily.  . sacubitril-valsartan (ENTRESTO) 97-103 MG Take 1 tablet by mouth 2 (two) times daily.  . Saline 0.2 % SOLN Place 1 spray into both nostrils as needed (congestion).   Marland Kitchen spironolactone (ALDACTONE) 25 MG tablet Take 1 tablet (25 mg total) by mouth every evening.  Marland Kitchen tiZANidine (ZANAFLEX) 4 MG tablet Take 4 mg by mouth at bedtime.   Marland Kitchen VITAMIN D PO Take 1 tablet by mouth 2 (two) times a week. Mondays and Wednesdays  . [DISCONTINUED] insulin aspart (NOVOLOG FLEXPEN) 100 UNIT/ML FlexPen Inject 10-16 Units into the skin 3 (three) times daily before meals.  . [DISCONTINUED] Insulin Detemir (LEVEMIR) 100 UNIT/ML Pen Inject 40 Units into the  skin at bedtime.  . [DISCONTINUED] Insulin Detemir (LEVEMIR) 100 UNIT/ML Pen Inject 50 Units into the skin at bedtime.   No facility-administered encounter medications on file as of 03/12/2019.    ALLERGIES: Allergies  Allergen Reactions  . Hydralazine Other (See Comments)    Syncope due to hypotension  . Bidil [Isosorb Dinitrate-Hydralazine]   . Amoxicillin Other (See Comments)    Yeast infection during treatment  . Hydrocodone Palpitations, Rash and Other (See Comments)    GI upset, also  . Lisinopril Palpitations and Other (See Comments)    Chest pain, too  . Meloxicam Nausea Only  . Strawberry Extract Hives    Does not always happen  . Tape Rash    Some tapes cause rashes  . Toradol [Ketorolac Tromethamine] Palpitations    VACCINATION STATUS:  There is no immunization history on file for this patient.  Diabetes She presents for her follow-up diabetic visit. She has type 2 diabetes mellitus. Onset time: She was diagnosed at approximate age of 39 years. Her disease course has been improving. There are no hypoglycemic associated symptoms. Pertinent negatives for hypoglycemia include no confusion, headaches, pallor or seizures. Associated symptoms include fatigue and polyuria. Pertinent negatives for diabetes include no chest pain, no polydipsia and no polyphagia. There are no hypoglycemic complications. Symptoms are improving. Diabetic complications include heart disease, nephropathy and retinopathy. Risk factors for coronary artery disease include diabetes mellitus, dyslipidemia, hypertension, obesity, sedentary lifestyle and post-menopausal. Current diabetic treatments: She is currently on Levemir 50 units nightly, 25 units in the morning.  She is also on NovoLog 2-20 units 3 times daily. She is following a generally unhealthy diet. When asked about meal planning, she reported  none. She has not had a previous visit with a dietitian. She never participates in exercise. Her home blood  glucose trend is fluctuating minimally. (She did not bring her logs.  Review of her meter shows average glucose between 164-178 over the last 30 days.  Her previsit labs show A1c of 7.7%, remaining stable.    ) An ACE inhibitor/angiotensin II receptor blocker is not being taken. Eye exam is current.  Hyperlipidemia This is a chronic problem. The current episode started more than 1 year ago. The problem is uncontrolled. Pertinent negatives include no chest pain, myalgias or shortness of breath. Risk factors for coronary artery disease include diabetes mellitus, dyslipidemia, hypertension, obesity, post-menopausal and family history.  Hypertension This is a chronic problem. The current episode started more than 1 year ago. The problem is controlled. Pertinent negatives include no chest pain, headaches, palpitations or shortness of breath. Risk factors for coronary artery disease include diabetes mellitus, dyslipidemia, obesity, sedentary lifestyle and post-menopausal state. Past treatments include beta blockers. Hypertensive end-organ damage includes kidney disease, CAD/MI and retinopathy.   Review of systems:  Constitutional: no weight gain/loss, no fatigue, no subjective hyperthermia, no subjective hypothermia Eyes: no blurry vision, no xerophthalmia ENT: no sore throat, no nodules palpated in throat, no dysphagia/odynophagia, no hoarseness Cardiovascular: no Chest Pain, no Shortness of Breath, no palpitations, no leg swelling Respiratory: no cough, no SOB Gastrointestinal: no Nausea/Vomiting/Diarhhea Musculoskeletal: no muscle/joint aches Skin: no rashes Neurological: no tremors, no numbness, no tingling, no dizziness Psychiatric: no depression, no anxiety   Objective:    BP (!) 144/76   Pulse 68   Ht 5' 8" (1.727 m)   Wt 247 lb 9.6 oz (112.3 kg)   BMI 37.65 kg/m   Wt Readings from Last 3 Encounters:  03/12/19 247 lb 9.6 oz (112.3 kg)  01/16/19 245 lb (111.1 kg)  01/14/19 240 lb 8.4  oz (109.1 kg)     Physical Exam- Limited  Constitutional:  Body mass index is 37.65 kg/m. , not in acute distress, normal state of mind Eyes:  EOMI, no exophthalmos Neck: Supple Respiratory: Adequate breathing efforts Musculoskeletal: no gross deformities, strength intact in all four extremities, no gross restriction of joint movements Skin:  no rashes, no hyperemia Neurological: no tremor with outstretched hands.   CMP ( most recent) CMP     Component Value Date/Time   NA 141 01/14/2019 0433   NA 142 10/19/2017 1600   K 4.1 01/14/2019 0433   CL 108 01/14/2019 0433   CO2 22 01/14/2019 0433   GLUCOSE 265 (H) 01/14/2019 0433   BUN 25 (H) 01/14/2019 0433   BUN 17 07/14/2018 0000   CREATININE 1.71 (H) 01/14/2019 0433   CREATININE 1.60 (H) 11/27/2018 1521   CALCIUM 8.9 01/14/2019 0433   PROT 7.4 11/27/2018 1521   ALBUMIN 2.6 (L) 03/15/2017 0253   AST 18 11/27/2018 1521   ALT 13 11/27/2018 1521   ALKPHOS 59 03/15/2017 0253   BILITOT 0.3 11/27/2018 1521   GFRNONAA 32 (L) 01/14/2019 0433   GFRNONAA 35 (L) 11/27/2018 1521   GFRAA 37 (L) 01/14/2019 0433   GFRAA 40 (L) 11/27/2018 1521     Diabetic Labs (most recent): Lab Results  Component Value Date   HGBA1C 7.7 (H) 01/14/2019   HGBA1C 7.6 (H) 11/27/2018   HGBA1C 8.5 07/14/2018     Lipid Panel ( most recent) Lipid Panel     Component Value Date/Time   CHOL 100 01/14/2019 0433   TRIG 92 01/14/2019  0433   HDL 32 (L) 01/14/2019 0433   CHOLHDL 3.1 01/14/2019 0433   VLDL 18 01/14/2019 0433   LDLCALC 50 01/14/2019 0433        Assessment & Plan:   1. DM type 2 causing vascular disease    - Stephanie Manning has currently uncontrolled symptomatic type 2 DM since  58 years of age. -She has not used her NovoLog any more than 1 time a day, her previsit A1c 7.7%.  Her average blood glucose is near target.    - I had a long discussion with her about the progressive nature of diabetes and the pathology behind its  complications. -her diabetes is complicated by retinopathy, nephropathy, coronary artery disease and she remains at a high risk for more acute and chronic complications which include CAD, CVA, CKD, retinopathy, and neuropathy. These are all discussed in detail with her.  - I have counseled her on diet management and weight loss, by adopting a carbohydrate restricted/protein rich diet.  - she  admits there is a room for improvement in her diet and drink choices. -  Suggestion is made for her to avoid simple carbohydrates  from her diet including Cakes, Sweet Desserts / Pastries, Ice Cream, Soda (diet and regular), Sweet Tea, Candies, Chips, Cookies, Sweet Pastries,  Store Bought Juices, Alcohol in Excess of  1-2 drinks a day, Artificial Sweeteners, Coffee Creamer, and "Sugar-free" Products. This will help patient to have stable blood glucose profile and potentially avoid unintended weight gain.   - I encouraged her to switch to  unprocessed or minimally processed complex starch and increased protein intake (animal or plant source), fruits, and vegetables.  - she is advised to stick to a routine mealtimes to eat 3 meals  a day and avoid unnecessary snacks ( to snack only to correct hypoglycemia).   - she will be scheduled with Jearld Fenton, RDN, CDE for diabetes education.  - I have approached her with the following individualized plan to manage  her diabetes and patient agrees:   - she will benefit from simplification of her treatment regimen.  Since she is not using her NovoLog 3 times a day, advised to discontinue.    -She is advised to increase her Levemir to 50 units nightly, continue to monitor blood glucose twice a day-daily before breakfast and at any time as needed.   - she is warned not to take insulin without proper monitoring per orders.  - she is encouraged to call clinic for blood glucose levels less than 70 or above 300 mg /dl.  - she is not a candidate for metformin, SGLT2  inhibitors due to concurrent renal insufficiency. -I discussed and initiated glipizide 5 mg XL p.o. daily at breakfast.  - she also will be considered for incretin therapy as appropriate next visit.  - Patient specific target  A1c;  LDL, HDL, Triglycerides, and  Waist Circumference were discussed in detail.  2) Blood Pressure /Hypertension:  Her blood pressure is slightly above target.   She is advised to continue her current blood pressure medications including carvedilol 12.5 mg p.o. daily with breakfast .  3) Lipids/Hyperlipidemia:   Review of her recent lipid panel showed  controlled  LDL at 73.  she  is advised to continue atorvastatin 80 mg p.o. daily at bedtime.  Side effects and precautions discussed with her.  4)  Weight/Diet: Her BMI is 36--   clearly complicating her diabetes care.  I discussed with her the fact that  loss of 5 - 10% of her  current body weight will have the most impact on her diabetes management.  CDE Consult will be initiated . Exercise, and detailed carbohydrates information provided  -  detailed on discharge instructions.  5) Chronic Care/Health Maintenance:  -she  is Statin medications and  is encouraged to initiate and continue to follow up with Ophthalmology, Dentist,  Podiatrist at least yearly or according to recommendations, and advised to   stay away from smoking. I have recommended yearly flu vaccine and pneumonia vaccine at least every 5 years; moderate intensity exercise for up to 150 minutes weekly; and  sleep for at least 7 hours a day.  - she is  advised to maintain close follow up with Margo Common, Jarvis Newcomer, MD for primary care needs, as well as her other providers for optimal and coordinated care.  - Time spent with the patient: 25 min, of which >50% was spent in reviewing her blood glucose logs , discussing her hypoglycemia and hyperglycemia episodes, reviewing her current and  previous labs / studies and medications  doses and developing a plan to avoid  hypoglycemia and hyperglycemia. Please refer to Patient Instructions for Blood Glucose Monitoring and Insulin/Medications Dosing Guide"  in media tab for additional information. Please  also refer to " Patient Self Inventory" in the Media  tab for reviewed elements of pertinent patient history.  Stephanie Manning participated in the discussions, expressed understanding, and voiced agreement with the above plans.  All questions were answered to her satisfaction. she is encouraged to contact clinic should she have any questions or concerns prior to her return visit.   Follow up plan: - Return in about 5 weeks (around 04/16/2019) for Bring Meter and Logs- A1c in Office.  Glade Lloyd, MD [facility] [address] [place], [place] [address] Phone: [phone]  Fax: [phone]    03/12/2019, 8:38 PM  This note was partially dictated with voice recognition software. Similar sounding words can be transcribed inadequately or may not  be corrected upon review.

## 2019-03-12 NOTE — Patient Instructions (Signed)

## 2019-03-13 ENCOUNTER — Encounter (HOSPITAL_COMMUNITY): Payer: Medicare Other | Admitting: Cardiology

## 2019-04-17 ENCOUNTER — Encounter (HOSPITAL_COMMUNITY): Payer: Medicare Other | Admitting: Cardiology

## 2019-04-18 ENCOUNTER — Telehealth: Payer: Self-pay | Admitting: Cardiology

## 2019-04-18 NOTE — Telephone Encounter (Signed)
New message   Patient wants to know if she can turn visit on 04/23/19 into a virtual visit. Patient has covid.

## 2019-04-18 NOTE — Telephone Encounter (Signed)
Spoke to patient she stated she is getting over covid.Stated she needs to reschedule Dr.Jordan's 2/8 appointment.Appointment rescheduled to 05/14/19 at 3:00 pm.

## 2019-04-23 ENCOUNTER — Ambulatory Visit: Payer: Medicare Other | Admitting: Cardiology

## 2019-05-08 DIAGNOSIS — D500 Iron deficiency anemia secondary to blood loss (chronic): Secondary | ICD-10-CM | POA: Insufficient documentation

## 2019-05-10 NOTE — Progress Notes (Signed)
Cardiology Office Note    Date:  05/14/2019   ID:  Stephanie Manning, Ehresman 11/12/1960, MRN [id number]  PCP:  [name], MD  Cardiologist: Dr. [name] Heart failure clinic: Dr. [name]  Chief Complaint  Patient presents with  . Coronary Artery Disease  . Congestive Heart Failure    History of Present Illness:  Stephanie Manning is a 59 y.o. female with PMH of CAD, DM II, CKD stage IV, HTN, HLD, CAD and ICM.  In December 2018 she had anterior MI with DES to totally occluded mid LAD.  She returned in January 2019 for staged DES to 80% RCA disease.  Echocardiogram in December 2018 showed ejection fraction of 30 to 35%.  Repeat echocardiogram in March 2019 continue to show ejection fraction 30 to 35%.  Despite intervention, she still has intermittent chest discomfort.  She was referred to Dr. [name] for management of heart failure.  She was last seen in the heart failure clinic on 09/27/2017, she was on carvedilol, Entresto was increased to 49-51 mg twice daily, Lasix reduced to 20 mg every other day and she is also on spironolactone 12.5 mg daily.  Given her persistent low ejection fraction, she was referred to electrophysiology service for consideration of ICD.  She was admitted to the hospital on 10/10/2017 with recurrent chest pain.  She underwent Myoview which came back high risk.  She eventually underwent cardiac catheterization on 10/12/2017 which showed 75% prox LCx, 60% OM1, 40% LM, 100% mid LAD chronic occlusion, 95% post atrio lesion.  The culprit lesion was felt to be post atrial lesion, however best management was medical therapy given the small caliber of the artery.  She was recommended to continue aspirin and Brilinta for long-term, beyond 32-month  I increased her Imdur to 60 mg daily.  During this admission, carvedilol was reduced to 6.25 mg twice daily due to bradycardia, Mobitz type I and up to 2 dropped beats and dizziness.  Her Entresto was increased  twice daily for high  blood pressure.  She has been followed closely by Dr MAundra Dubinin the CHF clinic last seen there in September.  She is on Coreg and aldactone. She had follow up with Echo in June 2020 and EF was unchanged. She has been seen by Dr ARayann Hemanfor consideration of ICD but has deferred.   On follow up today she is doing quite well. Denies  increased dyspnea. She did contract Covid in January but fortunately had a mild case with slight fever and chills. No increased edema. Careful with her salt intake. Notes some cramps in right upper abdomen and right hand. Rare chest pain- not bad. Did note some SOB with walking but it is better now.    Past Medical History:  Diagnosis Date  . AKI (acute kidney injury) ([place] 02/2017  . CAD in native artery    a. NSTEMI 02/2017 s/p DES to mLAD, diffuse disease otherwise, EF 30-35%. b. Staged cath 03/2017 - patent stent in the proximal to mid LAD. CTO of the distal LAD, 90% diffuse small first diagonal, 90% distal LCx/OM, 80% diffuse prox RCA, moderately elevated LVEDP 270mg, s/p DES to mRCA, residual disease treated medically (too small for PCI), EF 30-35%.  . Chronic combined systolic and diastolic CHF (congestive heart failure) ([facility]  . CKD (chronic kidney disease), stage IV ([place]   /nArchie Endo/09/2017  . Diabetes mellitus, type II, insulin dependent ([facility]  . GERD (gastroesophageal reflux disease)   .  History of blood transfusion    "related to menses"  . History of stomach ulcers 2017  . Hyperlipidemia   . Hypertension   . Iron deficiency anemia 03/2017   "had to have iron infusions" (10/10/2017)  . Ischemic cardiomyopathy   . STEMI (ST elevation myocardial infarction) ([place]) 02/2017    Past Surgical History:  Procedure Laterality Date  . CESAREAN SECTION  1987  . CORONARY ANGIOPLASTY WITH STENT PLACEMENT  04/01/2017   drug-eluting stent was successfully placed using a STENT SYNERGY DES [id number]  . CORONARY STENT INTERVENTION N/A 04/01/2017   Procedure: CORONARY  STENT INTERVENTION;  Surgeon: [name], [name], MD;  Location: [facility];  Service: Cardiovascular;  Laterality: N/A;  . CORONARY/GRAFT ACUTE MI REVASCULARIZATION N/A 03/10/2017   Procedure: Coronary/Graft Acute MI Revascularization;  Surgeon: [name], [name], MD;  Location: [facility];  Service: Cardiovascular;  Laterality: N/A;  . LEFT HEART CATH AND CORONARY ANGIOGRAPHY N/A 03/10/2017   Procedure: LEFT HEART CATH AND CORONARY ANGIOGRAPHY;  Surgeon: [name], [name], MD;  Location: [facility];  Service: Cardiovascular;  Laterality: N/A;  . LEFT HEART CATH AND CORONARY ANGIOGRAPHY N/A 04/01/2017   Procedure: LEFT HEART CATH AND CORONARY ANGIOGRAPHY;  Surgeon: [name], [name], MD;  Location: [facility];  Service: Cardiovascular;  Laterality: N/A;  . LEFT HEART CATH AND CORONARY ANGIOGRAPHY N/A 10/12/2017   Procedure: LEFT HEART CATH AND CORONARY ANGIOGRAPHY;  Surgeon: Sherren Mocha, MD;  Location: [facility];  Service: Cardiovascular;  Laterality: N/A;  . OOPHORECTOMY     "1 ovary removed"  . THROAT SURGERY  02/2014   "nodule in my throat removed; not related to thyroid"    Current Medications: Outpatient Medications Prior to Visit  Medication Sig Dispense Refill  . Accu-Chek Softclix Lancets lancets Use as instructed 100 each 2  . aspirin 81 MG chewable tablet Chew 1 tablet (81 mg total) by mouth daily.    Marland Kitchen atorvastatin (LIPITOR) 80 MG tablet Take 1 tablet (80 mg total) by mouth daily at 6 PM. NEEDS APPOINTMENT WITH DR [name] FOR FUTURE REFILLS 90 tablet 3  . blood glucose meter kit and supplies Dispense based on patient and insurance preference. Use up to four times daily as directed. (FOR ICD-10 E11.65) Pt prefers One touch mini 1 each 5  . BRILINTA 60 MG TABS tablet TAKE ONE TABLET BY MOUTH TWICE A DAY (Patient taking differently: Take 60 mg by mouth 2 (two) times daily. ) 120 tablet 5  . carvedilol (COREG) 12.5 MG tablet Take 1 tablet (12.5 mg  total) by mouth 2 (two) times daily with a meal. 180 tablet 3  . ELDERBERRY PO Take 2 tablets by mouth every other day.     . ferrous sulfate 300 (60 Fe) MG/5ML syrup Take 300 mg by mouth daily with breakfast.    . gabapentin (NEURONTIN) 300 MG capsule Take 600 mg by mouth at bedtime.    Marland Kitchen glipiZIDE (GLUCOTROL XL) 5 MG 24 hr tablet Take 1 tablet (5 mg total) by mouth daily with breakfast. 30 tablet 3  . glucose blood (ACCU-CHEK GUIDE) test strip TEST 4 TIMES DAILY BEFORE MEALS AND AT BEDTIME Dx: E11.59 150 each 5  . Insulin Detemir (LEVEMIR) 100 UNIT/ML Pen Inject 50 Units into the skin at bedtime. 15 mL 3  . isosorbide mononitrate (IMDUR) 30 MG 24 hr tablet Take 1 tablet (30 mg total) by mouth daily. 90 tablet 3  . Multiple Vitamin (  MULTIVITAMIN WITH MINERALS) TABS tablet Take 1 tablet by mouth daily.    . nitroGLYCERIN (NITROSTAT) 0.4 MG SL tablet DISSOLVE 1 TAB UNDER TONGUE FOR CHEST PAIN - IF PAIN REMAINS AFTER 5 MIN, CALL 911 AND REPEAT DOSE. MAX 3 TABS IN 15 MINUTES (Patient taking differently: Place 0.4 mg under the tongue every 5 (five) minutes as needed for chest pain. ) 25 tablet 0  . pantoprazole (PROTONIX) 40 MG tablet Take 1 tablet (40 mg total) by mouth 2 (two) times daily. 60 tablet 2  . sacubitril-valsartan (ENTRESTO) 97-103 MG Take 1 tablet by mouth 2 (two) times daily. 180 tablet 3  . Saline 0.2 % SOLN Place 1 spray into both nostrils as needed (congestion).     Marland Kitchen spironolactone (ALDACTONE) 25 MG tablet Take 1 tablet (25 mg total) by mouth every evening. 90 tablet 3  . tiZANidine (ZANAFLEX) 4 MG tablet Take 4 mg by mouth at bedtime.     Marland Kitchen VITAMIN D PO Take 1 tablet by mouth 2 (two) times a week. Mondays and Wednesdays     No facility-administered medications prior to visit.     Allergies:   Hydralazine, Bidil [isosorb dinitrate-hydralazine], Amoxicillin, Hydrocodone, Lisinopril, Meloxicam, Strawberry extract, Tape, and Toradol [ketorolac tromethamine]   Social History    Socioeconomic History  . Marital status: Widowed    Spouse name: Not on file  . Number of children: 2  . Years of education: Not on file  . Highest education level: Not on file  Occupational History  . Not on file  Tobacco Use  . Smoking status: Never Smoker  . Smokeless tobacco: Never Used  Substance and Sexual Activity  . Alcohol use: Never  . Drug use: Never  . Sexual activity: Yes  Other Topics Concern  . Not on file  Social History Narrative  . Not on file   Social Determinants of Health   Financial Resource Strain:   . Difficulty of Paying Living Expenses: Not on file  Food Insecurity:   . Worried About Charity fundraiser in the Last Year: Not on file  . Ran Out of Food in the Last Year: Not on file  Transportation Needs:   . Lack of Transportation (Medical): Not on file  . Lack of Transportation (Non-Medical): Not on file  Physical Activity:   . Days of Exercise per Week: Not on file  . Minutes of Exercise per Session: Not on file  Stress:   . Feeling of Stress : Not on file  Social Connections:   . Frequency of Communication with Friends and Family: Not on file  . Frequency of Social Gatherings with Friends and Family: Not on file  . Attends Religious Services: Not on file  . Active Member of Clubs or Organizations: Not on file  . Attends Archivist Meetings: Not on file  . Marital Status: Not on file     Family History:  The patient's family history includes Diabetes in her mother; Heart disease in her mother; Hypertension in her mother; Stroke in her mother.   ROS:   Please see the history of present illness.    ROS All other systems reviewed and are negative.   PHYSICAL EXAM:   VS:  BP 120/66   Pulse 67   Ht 5' 8.5" (1.74 m)   Wt 245 lb (111.1 kg)   SpO2 100%   BMI 36.71 kg/m    GEN: Well nourished, well developed, in no acute distress  HEENT: normal  Neck: no JVD, carotid bruits, or masses Cardiac: RRR; no murmurs, rubs, or  gallops,no edema  Respiratory:  clear to auscultation bilaterally, normal work of breathing GI: soft, nontender, nondistended, + BS MS: no deformity or atrophy  Skin: warm and dry, no rash Neuro:  Alert and Oriented x 3, Strength and sensation are intact Psych: euthymic mood, full affect  Wt Readings from Last 3 Encounters:  05/14/19 245 lb (111.1 kg)  03/12/19 247 lb 9.6 oz (112.3 kg)  01/16/19 245 lb (111.1 kg)      Studies/Labs Reviewed:   EKG:  EKG is not ordered today.    Recent Labs: 11/27/2018: ALT 13 01/14/2019: B Natriuretic Peptide 151.3; BUN 25; Creatinine, Ser 1.71; Hemoglobin 9.6; Platelets 197; Potassium 4.1; Sodium 141; TSH 2.377   Lipid Panel    Component Value Date/Time   CHOL 100 01/14/2019 0433   TRIG 92 01/14/2019 0433   HDL 32 (L) 01/14/2019 0433   CHOLHDL 3.1 01/14/2019 0433   VLDL 18 01/14/2019 0433   LDLCALC 50 01/14/2019 0433   Labs dated 04/30/19: Hgb 9.6. BUN 24, creatinine 1.62. otherwise CMET normal. Glucose 80  Additional studies/ records that were reviewed today include:   Cath 10/12/2017  Ost LM lesion is 40% stenosed.  Prox Cx to Dist Cx lesion is 75% stenosed.  Ost 1st Mrg lesion is 60% stenosed.  Previously placed Prox LAD to Mid LAD stent (unknown type) is widely patent.  Mid LAD lesion is 100% stenosed.  Ost 2nd Diag to 2nd Diag lesion is 70% stenosed.  Prox RCA lesion is 30% stenosed.  Mid RCA lesion is 40% stenosed.  Post Atrio lesion is 95% stenosed.   1.  Continued patency of the stented segment in the proximal LAD and stented segment in the proximal RCA 2.  Progressive small vessel CAD involving the distal left circumflex and right posterolateral branch 3.  Chronic occlusion of the mid LAD beyond the second diagonal branch unchanged in appearance from the previous study 4.  Normal LVEDP  Recommendation: Ongoing aggressive medical therapy.  The patient's probable culprit lesion is a severe stenosis in the distal  posterior lateral branch involving a small territory and small caliber vessel that I think would be best treated with medical therapy rather than intervention.  Recommend dual antiplatelet therapy with Aspirin 40m daily and Ticagrelor 928mtwice daily long-term (beyond 12 months) because of diffuse CAD/diabetes/multivessel stenting.  Echo 09/01/18: IMPRESSIONS    1. The left ventricle has moderate-severely reduced systolic function, with an ejection fraction of 30-35%. The cavity size was mildly dilated. Left ventricular diastolic Doppler parameters are consistent with pseudonormalization.  2. The right ventricle has normal systolic function. The cavity was normal.  3. The mitral valve is grossly normal.  4. The aortic valve is tricuspid. Mild thickening of the aortic valve. No stenosis of the aortic valve.  5. Technically difficult; definity used; akinesis of the apex with overall moderate to severe LV dysfunction; moderate diastolic dysfunction.  ASSESSMENT:    No diagnosis found.   PLAN:  In order of problems listed above:  1. Chronic combined systolic and diastolic heart failure: EF 30-35%.  On maximal Coreg due to baseline bradycardia and presence of Mobitz 1 heart block during the hospitalization.  On Entresto and aldactone. Unable to tolerate Bidil due to lightheadedness but is tolerating Imdur OK. Appears well compensated today.  2. CAD: s/p DES of LAD in 2018 in setting of anterior MI. S/p DES of RCA in January 2019.  I reviewed her last cardiac cath in July 2019. She has extensive small distal branch vessel disease in distal LAD (CTO), small diagonal, distal LCX, and 95% PL lesion. these are not amenable to PCI due to small vessel size. High risk Myoview in July 2019 with cardiac cath as noted. Medical management.  Class 1-2 angina.  3. Hypertension: Blood pressure is well controlled today.   4. Hyperlipidemia: On Lipitor 80 mg daily.  Cholesterol was well controlled on recent  lab work. LDL of 50.   5. DM2: On insulin, managed by primary care provider. Currently on glipizide and insulin. Given CHF I would favor switching glipizide to an SGLT2 inhibitor.     Medication Adjustments/Labs and Tests Ordered: Current medicines are reviewed at length with the patient today.  Concerns regarding medicines are outlined above.  Medication changes, Labs and Tests ordered today are listed in the Patient Instructions below. There are no Patient Instructions on file for this visit.   Signed, Peter [name], MD  05/14/2019 3:33 PM    [facility] [place], [place], [place]  [address] Phone: [phone]; Fax: [phone]

## 2019-05-14 ENCOUNTER — Telehealth: Payer: Self-pay | Admitting: Cardiology

## 2019-05-14 ENCOUNTER — Other Ambulatory Visit: Payer: Self-pay

## 2019-05-14 ENCOUNTER — Encounter: Payer: Self-pay | Admitting: Cardiology

## 2019-05-14 ENCOUNTER — Ambulatory Visit (INDEPENDENT_AMBULATORY_CARE_PROVIDER_SITE_OTHER): Payer: Medicare Other | Admitting: Cardiology

## 2019-05-14 VITALS — BP 120/66 | HR 67 | Ht 68.5 in | Wt 245.0 lb

## 2019-05-14 DIAGNOSIS — I5042 Chronic combined systolic (congestive) and diastolic (congestive) heart failure: Secondary | ICD-10-CM | POA: Diagnosis not present

## 2019-05-14 DIAGNOSIS — I255 Ischemic cardiomyopathy: Secondary | ICD-10-CM | POA: Diagnosis not present

## 2019-05-14 DIAGNOSIS — E785 Hyperlipidemia, unspecified: Secondary | ICD-10-CM

## 2019-05-14 DIAGNOSIS — I25118 Atherosclerotic heart disease of native coronary artery with other forms of angina pectoris: Secondary | ICD-10-CM

## 2019-05-14 DIAGNOSIS — N184 Chronic kidney disease, stage 4 (severe): Secondary | ICD-10-CM

## 2019-05-14 DIAGNOSIS — I10 Essential (primary) hypertension: Secondary | ICD-10-CM | POA: Diagnosis not present

## 2019-05-14 MED ORDER — ATORVASTATIN CALCIUM 80 MG PO TABS: 80.0000 mg | ORAL_TABLET | Freq: Every day | ORAL | 3 refills | Status: DC

## 2019-05-14 MED ORDER — TICAGRELOR 60 MG PO TABS: 60.0000 mg | ORAL_TABLET | Freq: Two times a day (BID) | ORAL | 5 refills | Status: DC

## 2019-05-14 NOTE — Telephone Encounter (Signed)
Please advise...  If okay? Appointment today

## 2019-05-14 NOTE — Telephone Encounter (Signed)
  Pt is requesting if she can have her daughter Lurline Idol to come in with her appt today 05/14/2019. She said she's afraid that she won't remember the important things Dr. [name] will tell her.   Please advise

## 2019-05-14 NOTE — Telephone Encounter (Signed)
Returned call to patient advised ok for daughter to come back to appointment this afternoon with Dr.Jordan.

## 2019-05-22 ENCOUNTER — Ambulatory Visit (HOSPITAL_COMMUNITY)
Admission: RE | Admit: 2019-05-22 | Discharge: 2019-05-22 | Disposition: A | Discharge status: Home / Self Care | Payer: Medicare Other | Source: Ambulatory Visit | Attending: Cardiology | Admitting: Cardiology

## 2019-05-22 ENCOUNTER — Other Ambulatory Visit: Payer: Self-pay

## 2019-05-22 ENCOUNTER — Encounter (HOSPITAL_COMMUNITY): Payer: Self-pay | Admitting: Cardiology

## 2019-05-22 VITALS — BP 146/76 | HR 67 | Wt 250.8 lb

## 2019-05-22 DIAGNOSIS — R0789 Other chest pain: Secondary | ICD-10-CM | POA: Diagnosis present

## 2019-05-22 DIAGNOSIS — I25110 Atherosclerotic heart disease of native coronary artery with unstable angina pectoris: Secondary | ICD-10-CM | POA: Diagnosis not present

## 2019-05-22 DIAGNOSIS — D509 Iron deficiency anemia, unspecified: Secondary | ICD-10-CM | POA: Diagnosis not present

## 2019-05-22 DIAGNOSIS — Z8249 Family history of ischemic heart disease and other diseases of the circulatory system: Secondary | ICD-10-CM | POA: Diagnosis not present

## 2019-05-22 DIAGNOSIS — I252 Old myocardial infarction: Secondary | ICD-10-CM | POA: Diagnosis not present

## 2019-05-22 DIAGNOSIS — Z7982 Long term (current) use of aspirin: Secondary | ICD-10-CM | POA: Diagnosis not present

## 2019-05-22 DIAGNOSIS — N183 Chronic kidney disease, stage 3 unspecified: Secondary | ICD-10-CM | POA: Diagnosis not present

## 2019-05-22 DIAGNOSIS — E785 Hyperlipidemia, unspecified: Secondary | ICD-10-CM | POA: Diagnosis not present

## 2019-05-22 DIAGNOSIS — Z7901 Long term (current) use of anticoagulants: Secondary | ICD-10-CM | POA: Insufficient documentation

## 2019-05-22 DIAGNOSIS — Z7902 Long term (current) use of antithrombotics/antiplatelets: Secondary | ICD-10-CM | POA: Diagnosis not present

## 2019-05-22 DIAGNOSIS — I5022 Chronic systolic (congestive) heart failure: Secondary | ICD-10-CM | POA: Insufficient documentation

## 2019-05-22 DIAGNOSIS — I25118 Atherosclerotic heart disease of native coronary artery with other forms of angina pectoris: Secondary | ICD-10-CM

## 2019-05-22 DIAGNOSIS — Z955 Presence of coronary angioplasty implant and graft: Secondary | ICD-10-CM | POA: Diagnosis not present

## 2019-05-22 DIAGNOSIS — K219 Gastro-esophageal reflux disease without esophagitis: Secondary | ICD-10-CM | POA: Insufficient documentation

## 2019-05-22 DIAGNOSIS — Z794 Long term (current) use of insulin: Secondary | ICD-10-CM | POA: Insufficient documentation

## 2019-05-22 DIAGNOSIS — Z79899 Other long term (current) drug therapy: Secondary | ICD-10-CM | POA: Insufficient documentation

## 2019-05-22 DIAGNOSIS — I130 Hypertensive heart and chronic kidney disease with heart failure and stage 1 through stage 4 chronic kidney disease, or unspecified chronic kidney disease: Secondary | ICD-10-CM | POA: Diagnosis not present

## 2019-05-22 DIAGNOSIS — E1122 Type 2 diabetes mellitus with diabetic chronic kidney disease: Secondary | ICD-10-CM | POA: Insufficient documentation

## 2019-05-22 DIAGNOSIS — I255 Ischemic cardiomyopathy: Secondary | ICD-10-CM | POA: Diagnosis not present

## 2019-05-22 DIAGNOSIS — I5042 Chronic combined systolic (congestive) and diastolic (congestive) heart failure: Secondary | ICD-10-CM

## 2019-05-22 LAB — BASIC METABOLIC PANEL
Anion gap: 10 (ref 5–15)
BUN: 24 mg/dL — ABNORMAL HIGH (ref 6–20)
CO2: 22 mmol/L (ref 22–32)
Calcium: 9 mg/dL (ref 8.9–10.3)
Chloride: 112 mmol/L — ABNORMAL HIGH (ref 98–111)
Creatinine, Ser: 1.82 mg/dL — ABNORMAL HIGH (ref 0.44–1.00)
GFR calc Af Amer: 35 mL/min — ABNORMAL LOW (ref >60)
GFR calc non Af Amer: 30 mL/min — ABNORMAL LOW (ref >60)
Glucose, Bld: 66 mg/dL — ABNORMAL LOW (ref 70–99)
Potassium: 4.2 mmol/L (ref 3.5–5.1)
Sodium: 144 mmol/L (ref 135–145)

## 2019-05-22 MED ORDER — DAPAGLIFLOZIN PROPANEDIOL 10 MG PO TABS: 10.0000 mg | ORAL_TABLET | Freq: Every day | ORAL | 5 refills | Status: DC

## 2019-05-22 NOTE — Patient Instructions (Signed)
STOP Glipizide  START Farxiga 10mg  (1 tab) daily  Labs today We will only contact you if something comes back abnormal or we need to make some changes. Otherwise no news is good news!  Repeat labs on Tuesday March 23rd, 2021 at 11:30am Garage code [id number]  Your physician recommends that you schedule a follow-up appointment in: 4 months with Dr [name].  We will call you to schedule this appointment.   Please call office at [phone] option 2 if you have any questions or concerns.   At the [facility], you and your health needs are our priority. As part of our continuing mission to provide you with exceptional heart care, we have created designated Provider Care Teams. These Care Teams include your primary Cardiologist (physician) and Advanced Practice Providers (APPs- Physician Assistants and Nurse Practitioners) who all work together to provide you with the care you need, when you need it.   You may see any of the following providers on your designated Care Team at your next follow up: Marland Kitchen Dr Glori Bickers . Dr Loralie Champagne . Darrick Grinder, NP . Lyda Jester, PA . Audry Riles, PharmD   Please be sure to bring in all your medications bottles to every appointment.

## 2019-05-23 NOTE — Progress Notes (Signed)
Date:  05/23/2019   ID:  [name], DOB 01-06-1961, MRN [id number]  Provider location: [facility] Type of Visit: Established patient  PCP:  [name], MD  HF Cardiology: Dr. [name]   History of Present Illness: Stephanie Manning is a 59 y.o. female who has a history of CAD and ischemic cardiomyopathy.  In 12/18, she had anterior MI with DES to totally occluded mid LAD (culprit). She returned in 1/19 for staged DES to 80% RCA stenosis. Echo in 12/18 showed EF 30-35%.  Repeat echo in 3/19 showed EF stable at 30-35%.    She was admitted in 7/19 with chest pain, concern for unstable angina.  Culprit of symptoms was thought to be 95% stenosis in a small PLV vessel, medical treatment planned.   She saw Dr. Rayann Heman and decided against ICD.   Echo was done in 6/20 showing stable EF 30-35%.   She was unable to tolerate Bidil 1/2 tablet tid due to lightheadedness.   She returns for followup of CHF.  BP is high today though she says that this is unusual.  She continues to get mild central chest tightness if she walks fast up stairs, no change in this pattern. No dyspnea walking on flat ground.  Weight is up, she blames this on big appetite and minimal exertion recently.   Labs (2/19): K 4.7, creatinine 1.48, hgb 8.4 Labs (5/19): LDL 65, K 4.7, creatinine 1.66 Labs (8/19): K 5, creatinine 1.78  Labs (9/19): K 3.8, creatinine 1.6 Labs (12/19): K 3.9, creatinine 1.59 Labs (3/20): LDL 73, HDL 41 Labs (5/20): K 4.1, creatinine 1.59, LDL 73 Labs (9/20): K 4.2, creatinine 1.6 Labs (11/20): K 4.1, creatinine 1.7, LDL 50, HDL 32  PMH: 1. Type II diabetes 2. CKD stage 3.  3. Hyperlipidemia 4. HTN 5. Fe deficiency anemia 6. GERD 7. CAD: Anterior MI in 12/18 with DES to 100% stenosed mid LAD (culprit), also with CTO of distal LAD, 80% pRCA, 85% mLCx/90% dLCx, 90% OM3.  - 1/19 staged PCI of proximal RCA.  - Admitted with unstable angina 7/19.   LHC: 75% stenosis proximal-mid LCx, 60% OM1, total occlusion of the distal LAD, patent mid LAD stent, patent RCA stents, 95% stenosis in small PLV branch (likely culprit).  8. Chronic systolic CHF: Ischemic cardiomyopathy.  - Echo (12/18): EF 30-35%.  - Echo (3/19): EF 30-35%, WMAs in LAD territory, RV mildly dilated.  - Echo (6/20): EF 30-35%, mild LV dilation, normal RV size and systolic function.   Current Outpatient Medications  Medication Sig Dispense Refill  . Accu-Chek Softclix Lancets lancets Use as instructed 100 each 2  . aspirin 81 MG chewable tablet Chew 1 tablet (81 mg total) by mouth daily.    Marland Kitchen atorvastatin (LIPITOR) 80 MG tablet Take 1 tablet (80 mg total) by mouth daily at 6 PM. NEEDS APPOINTMENT WITH DR [name] FOR FUTURE REFILLS 90 tablet 3  . blood glucose meter kit and supplies Dispense based on patient and insurance preference. Use up to four times daily as directed. (FOR ICD-10 E11.65) Pt prefers One touch mini 1 each 5  . carvedilol (COREG) 12.5 MG tablet Take 1 tablet (12.5 mg total) by mouth 2 (two) times daily with a meal. 180 tablet 3  . ELDERBERRY PO Take 2 tablets by mouth every other day.     . ferrous sulfate 300 (60 Fe) MG/5ML syrup Take 300 mg by mouth daily with breakfast.    .  gabapentin (NEURONTIN) 300 MG capsule Take 600 mg by mouth at bedtime.    Marland Kitchen glucose blood (ACCU-CHEK GUIDE) test strip TEST 4 TIMES DAILY BEFORE MEALS AND AT BEDTIME Dx: E11.59 150 each 5  . Insulin Detemir (LEVEMIR) 100 UNIT/ML Pen Inject 50 Units into the skin at bedtime. 15 mL 3  . isosorbide mononitrate (IMDUR) 30 MG 24 hr tablet Take 1 tablet (30 mg total) by mouth daily. 90 tablet 3  . Multiple Vitamin (MULTIVITAMIN WITH MINERALS) TABS tablet Take 1 tablet by mouth daily.    . nitroGLYCERIN (NITROSTAT) 0.4 MG SL tablet DISSOLVE 1 TAB UNDER TONGUE FOR CHEST PAIN - IF PAIN REMAINS AFTER 5 MIN, CALL 911 AND REPEAT DOSE. MAX 3 TABS IN 15 MINUTES (Patient taking differently: Place 0.4 mg  under the tongue every 5 (five) minutes as needed for chest pain. ) 25 tablet 0  . pantoprazole (PROTONIX) 40 MG tablet Take 1 tablet (40 mg total) by mouth 2 (two) times daily. 60 tablet 2  . sacubitril-valsartan (ENTRESTO) 97-103 MG Take 1 tablet by mouth 2 (two) times daily. 180 tablet 3  . Saline 0.2 % SOLN Place 1 spray into both nostrils as needed (congestion).     Marland Kitchen spironolactone (ALDACTONE) 25 MG tablet Take 1 tablet (25 mg total) by mouth every evening. 90 tablet 3  . ticagrelor (BRILINTA) 60 MG TABS tablet Take 1 tablet (60 mg total) by mouth 2 (two) times daily. 180 tablet 5  . tiZANidine (ZANAFLEX) 4 MG tablet Take 4 mg by mouth at bedtime.     Marland Kitchen VITAMIN D PO Take 1 tablet by mouth 2 (two) times a week. Mondays and Wednesdays    . dapagliflozin propanediol (FARXIGA) 10 MG TABS tablet Take 10 mg by mouth daily before breakfast. 30 tablet 5   No current facility-administered medications for this encounter.    Allergies:   Hydralazine, Bidil [isosorb dinitrate-hydralazine], Amoxicillin, Hydrocodone, Lisinopril, Meloxicam, Strawberry extract, Tape, and Toradol [ketorolac tromethamine]   Social History:  The patient  reports that she has never smoked. She has never used smokeless tobacco. She reports that she does not drink alcohol or use drugs.   Family History:  The patient's family history includes Diabetes in her mother; Heart disease in her mother; Hypertension in her mother; Stroke in her mother.   ROS:  Please see the history of present illness.   All other systems are personally reviewed and negative.   Exam:   BP (!) 146/76   Pulse 67   Wt 113.8 kg (250 lb 12.8 oz)   SpO2 99%   BMI 37.58 kg/m  General: NAD Neck: No JVD, no thyromegaly or thyroid nodule.  Lungs: Clear to auscultation bilaterally with normal respiratory effort. CV: Nondisplaced PMI.  Heart regular S1/S2, no S3/S4, no murmur.  No peripheral edema.  No carotid bruit.  Normal pedal pulses.  Abdomen: Soft,  nontender, no hepatosplenomegaly, no distention.  Skin: Intact without lesions or rashes.  Neurologic: Alert and oriented x 3.  Psych: Normal affect. Extremities: No clubbing or cyanosis.  HEENT: Normal.   Recent Labs: 11/27/2018: ALT 13 01/14/2019: B Natriuretic Peptide 151.3; Hemoglobin 9.6; Platelets 197; TSH 2.377 05/22/2019: BUN 24; Creatinine, Ser 1.82; Potassium 4.2; Sodium 144  Personally reviewed   Wt Readings from Last 3 Encounters:  05/22/19 113.8 kg (250 lb 12.8 oz)  05/14/19 111.1 kg (245 lb)  03/12/19 112.3 kg (247 lb 9.6 oz)      ASSESSMENT AND PLAN:  1. CAD: Anterior  MI in 12/18 with DES to proximal LAD (culprit) followed by staged DES to proximal RCA.  7/19 admission with unstable angina, likely culprit was 95% stenosis in small PLV.  This was managed conservatively.  She has mild chronic angina, no change in pattern.      - Continue atorvastatin, good lipids in 11/20.     - Continue ASA 81 daily and ticagrelor 60 mg bid.  - Continue Imdur 30 mg daily.  2. Chronic systolic CHF: Ischemic cardiomyopathy. EF has been stably low in the 30-35% range (no change on 6/20 echo).  NYHA class II symptoms.  She is not volume overloaded.  She saw Dr. Rayann Heman and decided against ICD.  Narrow QRS, so not CRT candidate.  - Continue Coreg 12.5 mg bid.     - Continue Entresto 97/103 bid. BMET today.   - Can continue prn use of Lasix.  - Continue spironolactone 25 mg daily.    - She was unable to tolerate Bidil at lowest does due to lightheadedness.    - We discussed ICD again, she is not interested at this time but understands the ratioale behind getting one.  - I will have her stop glipizide and start dapagliflozin 10 mg daily.  BMET 10 days.  3. CKD: stage 3.    4. Fe deficiency anemia: Long-standing.  She has had IV Fe infusions.  5. Type II DM: As above, stop glipizide and start dapagliflozin.   Followup in 4 months.    Signed, Loralie Champagne, MD  05/23/2019  [facility] [address] [facility] [place] [address] [phone] (office) [phone] (fax)

## 2019-05-29 ENCOUNTER — Encounter (HOSPITAL_COMMUNITY): Payer: Self-pay | Admitting: *Deleted

## 2019-05-29 NOTE — Progress Notes (Signed)
Pt's husband is a English as a second language teacher and pt needs form completed to so that she can qualify for aide.  Forms completed and signed by Dr [name], completed form faxed to [facility] atten: Makea at [phone], pt is aware and copy mailed to her

## 2019-06-04 ENCOUNTER — Telehealth (HOSPITAL_COMMUNITY): Payer: Self-pay | Admitting: Vascular Surgery

## 2019-06-04 NOTE — Telephone Encounter (Signed)
Pt Called states she stopped [place] because of low blood pressure , pt is coming in tomorrow 3/23 for labs she wants to advise w/ Mclean about her low bp, I told her he had full clinic and would relay message to Triage nurse

## 2019-06-04 NOTE — Telephone Encounter (Signed)
If she got lightheaded, she can stop [place].  Should go back on glipizide (had stopped this to start [place]).

## 2019-06-05 ENCOUNTER — Ambulatory Visit (HOSPITAL_COMMUNITY)
Admission: RE | Admit: 2019-06-05 | Discharge: 2019-06-05 | Disposition: A | Discharge status: Home / Self Care | Payer: Medicare Other | Source: Ambulatory Visit | Attending: Cardiology | Admitting: Cardiology

## 2019-06-05 ENCOUNTER — Other Ambulatory Visit: Payer: Self-pay

## 2019-06-05 DIAGNOSIS — I5042 Chronic combined systolic (congestive) and diastolic (congestive) heart failure: Secondary | ICD-10-CM | POA: Insufficient documentation

## 2019-06-05 LAB — COMPREHENSIVE METABOLIC PANEL
ALT: 14 U/L (ref 0–44)
AST: 18 U/L (ref 15–41)
Albumin: 3.4 g/dL — ABNORMAL LOW (ref 3.5–5.0)
Alkaline Phosphatase: 75 U/L (ref 38–126)
Anion gap: 9 (ref 5–15)
BUN: 26 mg/dL — ABNORMAL HIGH (ref 6–20)
CO2: 23 mmol/L (ref 22–32)
Calcium: 8.4 mg/dL — ABNORMAL LOW (ref 8.9–10.3)
Chloride: 111 mmol/L (ref 98–111)
Creatinine, Ser: 1.67 mg/dL — ABNORMAL HIGH (ref 0.44–1.00)
GFR calc Af Amer: 38 mL/min — ABNORMAL LOW (ref >60)
GFR calc non Af Amer: 33 mL/min — ABNORMAL LOW (ref >60)
Glucose, Bld: 107 mg/dL — ABNORMAL HIGH (ref 70–99)
Potassium: 4.1 mmol/L (ref 3.5–5.1)
Sodium: 143 mmol/L (ref 135–145)
Total Bilirubin: 0.4 mg/dL (ref 0.3–1.2)
Total Protein: 7 g/dL (ref 6.5–8.1)

## 2019-06-05 LAB — CBC
HCT: 33.4 % — ABNORMAL LOW (ref 36.0–46.0)
Hemoglobin: 9.8 g/dL — ABNORMAL LOW (ref 12.0–15.0)
MCH: 27.9 pg (ref 26.0–34.0)
MCHC: 29.3 g/dL — ABNORMAL LOW (ref 30.0–36.0)
MCV: 95.2 fL (ref 80.0–100.0)
Platelets: 183 10*3/uL (ref 150–400)
RBC: 3.51 MIL/uL — ABNORMAL LOW (ref 3.87–5.11)
RDW: 13.6 % (ref 11.5–15.5)
WBC: 7.6 10*3/uL (ref 4.0–10.5)
nRBC: 0 % (ref 0.0–0.2)

## 2019-06-05 NOTE — Addendum Note (Signed)
Encounter addended by: Harvie Junior, CMA on: 06/05/2019 11:43 AM  Actions taken: Diagnosis association updated, Order list changed

## 2019-06-05 NOTE — Telephone Encounter (Signed)
Pt reported lightheadedness and said she already stopped farxiga and started glipizide.

## 2019-06-05 NOTE — Telephone Encounter (Signed)
Left vm requesting patient return my call

## 2019-06-15 ENCOUNTER — Ambulatory Visit: Payer: Medicare Other | Admitting: "Endocrinology

## 2019-06-22 ENCOUNTER — Encounter: Payer: Self-pay | Admitting: "Endocrinology

## 2019-06-22 ENCOUNTER — Ambulatory Visit (INDEPENDENT_AMBULATORY_CARE_PROVIDER_SITE_OTHER): Payer: Medicare Other | Admitting: "Endocrinology

## 2019-06-22 VITALS — BP 115/71 | HR 67 | Ht 68.5 in | Wt 245.2 lb

## 2019-06-22 DIAGNOSIS — E782 Mixed hyperlipidemia: Secondary | ICD-10-CM | POA: Diagnosis not present

## 2019-06-22 DIAGNOSIS — I10 Essential (primary) hypertension: Secondary | ICD-10-CM | POA: Diagnosis not present

## 2019-06-22 DIAGNOSIS — I255 Ischemic cardiomyopathy: Secondary | ICD-10-CM | POA: Diagnosis not present

## 2019-06-22 DIAGNOSIS — Z794 Long term (current) use of insulin: Secondary | ICD-10-CM

## 2019-06-22 DIAGNOSIS — E1159 Type 2 diabetes mellitus with other circulatory complications: Secondary | ICD-10-CM | POA: Diagnosis not present

## 2019-06-22 DIAGNOSIS — E1121 Type 2 diabetes mellitus with diabetic nephropathy: Secondary | ICD-10-CM

## 2019-06-22 DIAGNOSIS — N1832 Chronic kidney disease, stage 3b: Secondary | ICD-10-CM

## 2019-06-22 DIAGNOSIS — E1122 Type 2 diabetes mellitus with diabetic chronic kidney disease: Secondary | ICD-10-CM | POA: Insufficient documentation

## 2019-06-22 LAB — POCT GLYCOSYLATED HEMOGLOBIN (HGB A1C): Hemoglobin A1C: 7.2 % — AB (ref 4.0–5.6)

## 2019-06-22 NOTE — Progress Notes (Signed)
06/22/2019, 12:53 PM  Endocrinology follow-up note    Subjective:    Patient ID: Stephanie Manning, female    DOB: November 15, 1959.  Stephanie Manning is being seen in follow-up  for management of currently uncontrolled symptomatic diabetes requested by  [name], MD.   Past Medical History:  Diagnosis Date  . AKI (acute kidney injury) ([place]) 02/2017  . CAD in native artery    a. NSTEMI 02/2017 s/p DES to mLAD, diffuse disease otherwise, EF 30-35%. b. Staged cath 03/2017 - patent stent in the proximal to mid LAD. CTO of the distal LAD, 90% diffuse small first diagonal, 90% distal LCx/OM, 80% diffuse prox RCA, moderately elevated LVEDP 65mHg, s/p DES to mRCA, residual disease treated medically (too small for PCI), EF 30-35%.  . Chronic combined systolic and diastolic CHF (congestive heart failure) ([place]   . CKD (chronic kidney disease), stage IV ([facility]    /[name]2/09/2017  . Diabetes mellitus, type II, insulin dependent ([facility]   . GERD (gastroesophageal reflux disease)   . History of blood transfusion    "related to menses"  . History of stomach ulcers 2017  . Hyperlipidemia   . Hypertension   . Iron deficiency anemia 03/2017   "had to have iron infusions" (10/10/2017)  . Ischemic cardiomyopathy   . STEMI (ST elevation myocardial infarction) ([facility] 02/2017    Past Surgical History:  Procedure Laterality Date  . CESAREAN SECTION  1987  . CORONARY ANGIOPLASTY WITH STENT PLACEMENT  04/01/2017   drug-eluting stent was successfully placed using a STENT SYNERGY DES 24.19Q22 . CORONARY STENT INTERVENTION N/A 04/01/2017   Procedure: CORONARY STENT INTERVENTION;  Surgeon: JMartinique [name], MD;  [facility];  Service: Cardiovascular;  Laterality: N/A;  . CORONARY/GRAFT ACUTE MI REVASCULARIZATION N/A 03/10/2017   Procedure: Coronary/Graft Acute MI Revascularization;  Surgeon: JMartinique Peter  M, MD;  [facility];  Service: Cardiovascular;  Laterality: N/A;  . LEFT HEART CATH AND CORONARY ANGIOGRAPHY N/A 03/10/2017   Procedure: LEFT HEART CATH AND CORONARY ANGIOGRAPHY;  Surgeon: JMartinique [name], MD;  [facility];  Service: Cardiovascular;  Laterality: N/A;  . LEFT HEART CATH AND CORONARY ANGIOGRAPHY N/A 04/01/2017   Procedure: LEFT HEART CATH AND CORONARY ANGIOGRAPHY;  Surgeon: JMartinique [name], MD;  [facility];  Service: Cardiovascular;  Laterality: N/A;  . LEFT HEART CATH AND CORONARY ANGIOGRAPHY N/A 10/12/2017   Procedure: LEFT HEART CATH AND CORONARY ANGIOGRAPHY;  Surgeon: CSherren Mocha MD;  [facility];  Service: Cardiovascular;  Laterality: N/A;  . OOPHORECTOMY     "1 ovary removed"  . THROAT SURGERY  02/2014   "nodule in my throat removed; not related to thyroid"    Social History   Socioeconomic History  . Marital status: Widowed    Spouse name: Not on file  . Number of children: 2  . Years of education: Not on file  . Highest education level: Not on file  Occupational History  . Not on file  Tobacco Use  . Smoking status: Never Smoker  .  Smokeless tobacco: Never Used  Substance and Sexual Activity  . Alcohol use: Never  . Drug use: Never  . Sexual activity: Yes  Other Topics Concern  . Not on file  Social History Narrative  . Not on file   Social Determinants of Health   Financial Resource Strain:   . Difficulty of Paying Living Expenses:   Food Insecurity:   . Worried About Charity fundraiser in the Last Year:   . Arboriculturist in the Last Year:   Transportation Needs:   . Film/video editor (Medical):   Marland Kitchen Lack of Transportation (Non-Medical):   Physical Activity:   . Days of Exercise per Week:   . Minutes of Exercise per Session:   Stress:   . Feeling of Stress :   Social Connections:   . Frequency of Communication with Friends and Family:   . Frequency of Social  Gatherings with Friends and Family:   . Attends Religious Services:   . Active Member of Clubs or Organizations:   . Attends Archivist Meetings:   Marland Kitchen Marital Status:     Family History  Problem Relation Age of Onset  . Hypertension Mother   . Heart disease Mother   . Diabetes Mother   . Stroke Mother     Outpatient Encounter Medications as of 06/22/2019  Medication Sig  . Accu-Chek Softclix Lancets lancets Use as instructed  . aspirin 81 MG chewable tablet Chew 1 tablet (81 mg total) by mouth daily.  Marland Kitchen atorvastatin (LIPITOR) 80 MG tablet Take 1 tablet (80 mg total) by mouth daily at 6 PM. NEEDS APPOINTMENT WITH DR [name] FOR FUTURE REFILLS  . blood glucose meter kit and supplies Dispense based on patient and insurance preference. Use up to four times daily as directed. (FOR ICD-10 E11.65) Pt prefers One touch mini  . carvedilol (COREG) 12.5 MG tablet Take 1 tablet (12.5 mg total) by mouth 2 (two) times daily with a meal.  . ELDERBERRY PO Take 2 tablets by mouth every other day.   . ferrous sulfate 300 (60 Fe) MG/5ML syrup Take 300 mg by mouth daily with breakfast.  . gabapentin (NEURONTIN) 300 MG capsule Take 600 mg by mouth at bedtime.  Marland Kitchen glipiZIDE (GLUCOTROL) 5 MG tablet Take 1 tablet (5 mg total) by mouth daily before breakfast.  . glucose blood (ACCU-CHEK GUIDE) test strip TEST 4 TIMES DAILY BEFORE MEALS AND AT BEDTIME Dx: E11.59  . Insulin Detemir (LEVEMIR) 100 UNIT/ML Pen Inject 50 Units into the skin at bedtime.  . isosorbide mononitrate (IMDUR) 30 MG 24 hr tablet Take 1 tablet (30 mg total) by mouth daily.  . Multiple Vitamin (MULTIVITAMIN WITH MINERALS) TABS tablet Take 1 tablet by mouth daily.  . nitroGLYCERIN (NITROSTAT) 0.4 MG SL tablet DISSOLVE 1 TAB UNDER TONGUE FOR CHEST PAIN - IF PAIN REMAINS AFTER 5 MIN, CALL 911 AND REPEAT DOSE. MAX 3 TABS IN 15 MINUTES (Patient taking differently: Place 0.4 mg under the tongue every 5 (five) minutes as needed for chest pain. )   . pantoprazole (PROTONIX) 40 MG tablet Take 1 tablet (40 mg total) by mouth 2 (two) times daily.  . sacubitril-valsartan (ENTRESTO) 97-103 MG Take 1 tablet by mouth 2 (two) times daily.  . Saline 0.2 % SOLN Place 1 spray into both nostrils as needed (congestion).   Marland Kitchen spironolactone (ALDACTONE) 25 MG tablet Take 1 tablet (25 mg total) by mouth every evening.  . ticagrelor (BRILINTA) 60 MG TABS  tablet Take 1 tablet (60 mg total) by mouth 2 (two) times daily.  Marland Kitchen tiZANidine (ZANAFLEX) 4 MG tablet Take 4 mg by mouth at bedtime.   Marland Kitchen VITAMIN D PO Take 1 tablet by mouth 2 (two) times a week. Mondays and Wednesdays   No facility-administered encounter medications on file as of 06/22/2019.    ALLERGIES: Allergies  Allergen Reactions  . Hydralazine Other (See Comments)    Syncope due to hypotension  . Bidil [Isosorb Dinitrate-Hydralazine]   . Farxiga [Dapagliflozin]   . Amoxicillin Other (See Comments)    Yeast infection during treatment  . Hydrocodone Palpitations, Rash and Other (See Comments)    GI upset, also  . Lisinopril Palpitations and Other (See Comments)    Chest pain, too  . Meloxicam Nausea Only  . Strawberry Extract Hives    Does not always happen  . Tape Rash    Some tapes cause rashes  . Toradol [Ketorolac Tromethamine] Palpitations    VACCINATION STATUS:  There is no immunization history on file for this patient.  Diabetes She presents for her follow-up diabetic visit. She has type 2 diabetes mellitus. Onset time: She was diagnosed at approximate age of 26 years. Her disease course has been improving. There are no hypoglycemic associated symptoms. Pertinent negatives for hypoglycemia include no confusion, headaches, pallor or seizures. Associated symptoms include fatigue and polyuria. Pertinent negatives for diabetes include no chest pain, no polydipsia and no polyphagia. There are no hypoglycemic complications. Symptoms are improving. Diabetic complications include heart  disease, nephropathy and retinopathy. Risk factors for coronary artery disease include diabetes mellitus, dyslipidemia, hypertension, obesity, sedentary lifestyle and post-menopausal. Current diabetic treatments: She is currently on Levemir 50 units nightly, 25 units in the morning.  She is also on NovoLog 2-20 units 3 times daily. Her weight is fluctuating minimally. She is following a generally unhealthy diet. When asked about meal planning, she reported none. She has not had a previous visit with a dietitian. She never participates in exercise. Her home blood glucose trend is decreasing steadily. Her breakfast blood glucose range is generally 140-180 mg/dl. Her bedtime blood glucose range is generally 140-180 mg/dl. Her overall blood glucose range is 140-180 mg/dl. (She presents with controlled glycemic profile averaging 150 for the last 90 days of 157 weeks.  She did not have any hypoglycemia.  Her point-of-care A1c 7.2%, improving.    ) An ACE inhibitor/angiotensin II receptor blocker is not being taken. Eye exam is current.  Hyperlipidemia This is a chronic problem. The current episode started more than 1 year ago. The problem is uncontrolled. Pertinent negatives include no chest pain, myalgias or shortness of breath. Risk factors for coronary artery disease include diabetes mellitus, dyslipidemia, hypertension, obesity, post-menopausal and family history.  Hypertension This is a chronic problem. The current episode started more than 1 year ago. The problem is controlled. Pertinent negatives include no chest pain, headaches, palpitations or shortness of breath. Risk factors for coronary artery disease include diabetes mellitus, dyslipidemia, obesity, sedentary lifestyle and post-menopausal state. Past treatments include beta blockers. Hypertensive end-organ damage includes kidney disease, CAD/MI and retinopathy.    Review of systems  Constitutional: + Minimally fluctuating body weight,  current   Body mass index is 36.74 kg/m. , no fatigue, no subjective hyperthermia, no subjective hypothermia Eyes: no blurry vision, no xerophthalmia ENT: no sore throat, no nodules palpated in throat, no dysphagia/odynophagia, no hoarseness Cardiovascular: no Chest Pain, no Shortness of Breath, no palpitations, no leg swelling Respiratory: no  cough, no shortness of breath Gastrointestinal: no Nausea/Vomiting/Diarhhea Musculoskeletal: no muscle/joint aches Skin: no rashes, no hyperemia Neurological: no tremors, no numbness, no tingling, no dizziness Psychiatric: no depression, no anxiety   Objective:    BP 115/71   Pulse 67   Ht 5' 8.5" (1.74 m)   Wt 245 lb 3.2 oz (111.2 kg)   BMI 36.74 kg/m   Wt Readings from Last 3 Encounters:  06/22/19 245 lb 3.2 oz (111.2 kg)  05/22/19 250 lb 12.8 oz (113.8 kg)  05/14/19 245 lb (111.1 kg)      Physical Exam- Limited  Constitutional:  Body mass index is 36.74 kg/m. , not in acute distress, normal state of mind Eyes:  EOMI, no exophthalmos Neck: Supple Thyroid: No gross goiter Respiratory: Adequate breathing efforts Musculoskeletal: no gross deformities, strength intact in all four extremities, no gross restriction of joint movements Skin:  no rashes, no hyperemia Neurological: no tremor with outstretched hands,   CMP     Component Value Date/Time   NA 143 06/05/2019 1142   NA 142 10/19/2017 1600   K 4.1 06/05/2019 1142   CL 111 06/05/2019 1142   CO2 23 06/05/2019 1142   GLUCOSE 107 (H) 06/05/2019 1142   BUN 26 (H) 06/05/2019 1142   BUN 17 07/14/2018 0000   CREATININE 1.67 (H) 06/05/2019 1142   CREATININE 1.60 (H) 11/27/2018 1521   CALCIUM 8.4 (L) 06/05/2019 1142   PROT 7.0 06/05/2019 1142   ALBUMIN 3.4 (L) 06/05/2019 1142   AST 18 06/05/2019 1142   ALT 14 06/05/2019 1142   ALKPHOS 75 06/05/2019 1142   BILITOT 0.4 06/05/2019 1142   GFRNONAA 33 (L) 06/05/2019 1142   GFRNONAA 35 (L) 11/27/2018 1521   GFRAA 38 (L) 06/05/2019 1142    GFRAA 40 (L) 11/27/2018 1521     Diabetic Labs (most recent): Lab Results  Component Value Date   HGBA1C 7.2 (A) 06/22/2019   HGBA1C 7.7 (H) 01/14/2019   HGBA1C 7.6 (H) 11/27/2018     Lipid Panel ( most recent) Lipid Panel     Component Value Date/Time   CHOL 100 01/14/2019 0433   TRIG 92 01/14/2019 0433   HDL 32 (L) 01/14/2019 0433   CHOLHDL 3.1 01/14/2019 0433   VLDL 18 01/14/2019 0433   LDLCALC 50 01/14/2019 0433        Assessment & Plan:   1. DM type 2 causing vascular disease    - Makynlie Rossini has currently uncontrolled symptomatic type 2 DM since  59 years of age.   She presents with controlled glycemic profile averaging 150 for the last 90 days of 157 weeks.  She did not have any hypoglycemia.  Her point-of-care A1c 7.2%, improving.     - I had a long discussion with her about the progressive nature of diabetes and the pathology behind its complications. -her diabetes is complicated by retinopathy, nephropathy, coronary artery disease and she remains at a high risk for more acute and chronic complications which include CAD, CVA, CKD, retinopathy, and neuropathy. These are all discussed in detail with her.  - I have counseled her on diet management and weight loss, by adopting a carbohydrate restricted/protein rich diet.  - she  admits there is a room for improvement in her diet and drink choices. -  Suggestion is made for her to avoid simple carbohydrates  from her diet including Cakes, Sweet Desserts / Pastries, Ice Cream, Soda (diet and regular), Sweet Tea, Candies, Chips, Cookies, Sweet Pastries,  Store Bought Juices, Alcohol in Excess of  1-2 drinks a day, Artificial Sweeteners, Coffee Creamer, and "Sugar-free" Products. This will help patient to have stable blood glucose profile and potentially avoid unintended weight gain.  - I encouraged her to switch to  unprocessed or minimally processed complex starch and increased protein intake (animal or plant  source), fruits, and vegetables.  - she is advised to stick to a routine mealtimes to eat 3 meals  a day and avoid unnecessary snacks ( to snack only to correct hypoglycemia).   - she will be scheduled with Jearld Fenton, RDN, CDE for diabetes education.  - I have approached her with the following individualized plan to manage  her diabetes and patient agrees:   - she will benefit from simplification of her treatment regimen.  She will not need prandial insulin for now.    -She is advised to continue Levemir 50 units nightly, continue to monitor blood glucose twice daily-daily before breakfast and at bedtime.  - she is warned not to take insulin without proper monitoring per orders.  - she is encouraged to call clinic for blood glucose levels less than 70 or above 200 mg /dl.  - she is not a candidate for metformin, SGLT2 inhibitors due to concurrent renal insufficiency. -She has benefited from low-dose glipizide therapy.  She is advised to continue  glipizide 5 mg XL p.o. daily at breakfast.  - she also will be considered for incretin therapy as appropriate next visit.  - Patient specific target  A1c;  LDL, HDL, Triglycerides, and  Waist Circumference were discussed in detail.  2) Blood Pressure /Hypertension:  -Her blood pressure is controlled to target.  She is advised to continue her current blood pressure medications including carvedilol 12.5 mg p.o. daily with breakfast .  3) Lipids/Hyperlipidemia:   Review of her recent lipid panel showed  controlled  LDL at 73.  she  is advised to continue atorvastatin 80 mg daily at bedtime.    Side effects and precautions discussed with her.  4)  Weight/Diet: Her BMI is 36--   clearly complicating her diabetes care.  She is a candidate for modest weight loss.  I discussed with her the fact that loss of 5 - 10% of her  current body weight will have the most impact on her diabetes management.  CDE Consult will be initiated . Exercise, and  detailed carbohydrates information provided  -  detailed on discharge instructions.  5) Chronic Care/Health Maintenance:  -she  is Statin medications and  is encouraged to initiate and continue to follow up with Ophthalmology, Dentist,  Podiatrist at least yearly or according to recommendations, and advised to   stay away from smoking. I have recommended yearly flu vaccine and pneumonia vaccine at least every 5 years; moderate intensity exercise for up to 150 minutes weekly; and  sleep for at least 7 hours a day.  - she is  advised to maintain close follow up with Margo Common, Jarvis Newcomer, MD for primary care needs, as well as her other providers for optimal and coordinated care.  - Time spent on this patient care encounter:  35 min, of which > 50% was spent in  counseling and the rest reviewing her blood glucose logs , discussing her hypoglycemia and hyperglycemia episodes, reviewing her current and  previous labs / studies  ( including abstraction from other facilities) and medications  doses and developing a  long term treatment plan and documenting her care.   Please  refer to Patient Instructions for Blood Glucose Monitoring and Insulin/Medications Dosing Guide"  in media tab for additional information. Please  also refer to " Patient Self Inventory" in the Media  tab for reviewed elements of pertinent patient history.  Stephanie Manning participated in the discussions, expressed understanding, and voiced agreement with the above plans.  All questions were answered to her satisfaction. she is encouraged to contact clinic should she have any questions or concerns prior to her return visit.   Follow up plan: - Return in about 4 months (around 10/22/2019) for Bring Meter and Logs- A1c in Office, Follow up with Pre-visit Labs.  Glade Lloyd, MD [facility] [address] [place], [place] [address] Phone: [phone]  Fax: [phone]    06/22/2019,  12:53 PM  This note was partially dictated with voice recognition software. Similar sounding words can be transcribed inadequately or may not  be corrected upon review.

## 2019-06-22 NOTE — Patient Instructions (Signed)

## 2019-07-06 ENCOUNTER — Other Ambulatory Visit: Payer: Self-pay | Admitting: "Endocrinology

## 2019-07-06 ENCOUNTER — Other Ambulatory Visit: Payer: Self-pay | Admitting: Cardiology

## 2019-07-31 ENCOUNTER — Telehealth: Payer: Self-pay | Admitting: "Endocrinology

## 2019-07-31 MED ORDER — INSULIN DETEMIR 100 UNIT/ML FLEXPEN: 50.0000 [IU] | PEN_INJECTOR | Freq: Every day | SUBCUTANEOUS | 0 refills | Status: DC

## 2019-07-31 NOTE — Telephone Encounter (Signed)
Patient is requesting refill Insulin Detemir (LEVEMIR) 100 UNIT/ML Pen. [facility]

## 2019-07-31 NOTE — Telephone Encounter (Signed)
Rx refill sent.

## 2019-09-04 DIAGNOSIS — E7800 Pure hypercholesterolemia, unspecified: Secondary | ICD-10-CM

## 2019-09-07 ENCOUNTER — Telehealth (HOSPITAL_COMMUNITY): Payer: Self-pay | Admitting: Vascular Surgery

## 2019-10-05 NOTE — Telephone Encounter (Signed)
Encounter open in error 

## 2019-10-15 ENCOUNTER — Other Ambulatory Visit: Payer: Self-pay

## 2019-10-15 MED ORDER — GLIPIZIDE ER 5 MG PO TB24: 5.0000 mg | ORAL_TABLET | Freq: Every day | ORAL | 0 refills | Status: DC

## 2019-10-16 ENCOUNTER — Other Ambulatory Visit: Payer: Self-pay | Admitting: "Endocrinology

## 2019-10-24 ENCOUNTER — Telehealth: Payer: Self-pay | Admitting: "Endocrinology

## 2019-10-24 NOTE — Telephone Encounter (Signed)
Faxed lab order to [facility] office with confirmation

## 2019-10-24 NOTE — Telephone Encounter (Signed)
Faxed again to [phone]

## 2019-10-25 ENCOUNTER — Ambulatory Visit: Payer: Medicare Other | Admitting: Nurse Practitioner

## 2019-10-26 ENCOUNTER — Other Ambulatory Visit: Payer: Self-pay

## 2019-10-26 ENCOUNTER — Telehealth (HOSPITAL_COMMUNITY): Payer: Self-pay | Admitting: Cardiology

## 2019-10-26 ENCOUNTER — Ambulatory Visit (HOSPITAL_COMMUNITY)
Admission: RE | Admit: 2019-10-26 | Discharge: 2019-10-26 | Disposition: A | Discharge status: Home / Self Care | Payer: Medicare Other | Source: Ambulatory Visit | Attending: Cardiology | Admitting: Cardiology

## 2019-10-26 ENCOUNTER — Encounter (HOSPITAL_COMMUNITY): Payer: Self-pay | Admitting: Cardiology

## 2019-10-26 VITALS — BP 118/62 | HR 65 | Wt 246.8 lb

## 2019-10-26 DIAGNOSIS — I2510 Atherosclerotic heart disease of native coronary artery without angina pectoris: Secondary | ICD-10-CM | POA: Insufficient documentation

## 2019-10-26 DIAGNOSIS — I130 Hypertensive heart and chronic kidney disease with heart failure and stage 1 through stage 4 chronic kidney disease, or unspecified chronic kidney disease: Secondary | ICD-10-CM | POA: Insufficient documentation

## 2019-10-26 DIAGNOSIS — E1159 Type 2 diabetes mellitus with other circulatory complications: Secondary | ICD-10-CM

## 2019-10-26 DIAGNOSIS — I252 Old myocardial infarction: Secondary | ICD-10-CM | POA: Insufficient documentation

## 2019-10-26 DIAGNOSIS — I5042 Chronic combined systolic (congestive) and diastolic (congestive) heart failure: Secondary | ICD-10-CM | POA: Diagnosis not present

## 2019-10-26 DIAGNOSIS — Z79899 Other long term (current) drug therapy: Secondary | ICD-10-CM | POA: Insufficient documentation

## 2019-10-26 DIAGNOSIS — R0602 Shortness of breath: Secondary | ICD-10-CM | POA: Insufficient documentation

## 2019-10-26 DIAGNOSIS — Z794 Long term (current) use of insulin: Secondary | ICD-10-CM | POA: Diagnosis not present

## 2019-10-26 DIAGNOSIS — K219 Gastro-esophageal reflux disease without esophagitis: Secondary | ICD-10-CM | POA: Insufficient documentation

## 2019-10-26 DIAGNOSIS — E785 Hyperlipidemia, unspecified: Secondary | ICD-10-CM | POA: Insufficient documentation

## 2019-10-26 DIAGNOSIS — D509 Iron deficiency anemia, unspecified: Secondary | ICD-10-CM | POA: Insufficient documentation

## 2019-10-26 DIAGNOSIS — I255 Ischemic cardiomyopathy: Secondary | ICD-10-CM | POA: Diagnosis not present

## 2019-10-26 DIAGNOSIS — Z955 Presence of coronary angioplasty implant and graft: Secondary | ICD-10-CM | POA: Diagnosis not present

## 2019-10-26 DIAGNOSIS — Z7982 Long term (current) use of aspirin: Secondary | ICD-10-CM | POA: Insufficient documentation

## 2019-10-26 DIAGNOSIS — Z8249 Family history of ischemic heart disease and other diseases of the circulatory system: Secondary | ICD-10-CM | POA: Insufficient documentation

## 2019-10-26 DIAGNOSIS — N183 Chronic kidney disease, stage 3 unspecified: Secondary | ICD-10-CM | POA: Diagnosis not present

## 2019-10-26 DIAGNOSIS — E7800 Pure hypercholesterolemia, unspecified: Secondary | ICD-10-CM | POA: Diagnosis not present

## 2019-10-26 DIAGNOSIS — E782 Mixed hyperlipidemia: Secondary | ICD-10-CM

## 2019-10-26 DIAGNOSIS — Z7902 Long term (current) use of antithrombotics/antiplatelets: Secondary | ICD-10-CM | POA: Insufficient documentation

## 2019-10-26 DIAGNOSIS — E1122 Type 2 diabetes mellitus with diabetic chronic kidney disease: Secondary | ICD-10-CM | POA: Diagnosis not present

## 2019-10-26 DIAGNOSIS — Z7901 Long term (current) use of anticoagulants: Secondary | ICD-10-CM | POA: Diagnosis not present

## 2019-10-26 LAB — BASIC METABOLIC PANEL
Anion gap: 10 (ref 5–15)
BUN: 31 mg/dL — ABNORMAL HIGH (ref 6–20)
CO2: 20 mmol/L — ABNORMAL LOW (ref 22–32)
Calcium: 8.8 mg/dL — ABNORMAL LOW (ref 8.9–10.3)
Chloride: 109 mmol/L (ref 98–111)
Creatinine, Ser: 1.86 mg/dL — ABNORMAL HIGH (ref 0.44–1.00)
GFR calc Af Amer: 33 mL/min — ABNORMAL LOW (ref >60)
GFR calc non Af Amer: 29 mL/min — ABNORMAL LOW (ref >60)
Glucose, Bld: 174 mg/dL — ABNORMAL HIGH (ref 70–99)
Potassium: 4 mmol/L (ref 3.5–5.1)
Sodium: 139 mmol/L (ref 135–145)

## 2019-10-26 LAB — HEMOGLOBIN A1C
Hgb A1c MFr Bld: 8.3 % — ABNORMAL HIGH (ref 4.8–5.6)
Mean Plasma Glucose: 191.51 mg/dL

## 2019-10-26 LAB — MAGNESIUM: Magnesium: 1.9 mg/dL (ref 1.7–2.4)

## 2019-10-26 LAB — LIPID PANEL
Cholesterol: 123 mg/dL (ref 0–200)
HDL: 33 mg/dL — ABNORMAL LOW (ref >40)
LDL Cholesterol: 76 mg/dL (ref 0–99)
Total CHOL/HDL Ratio: 3.7 RATIO
Triglycerides: 69 mg/dL (ref <150)
VLDL: 14 mg/dL (ref 0–40)

## 2019-10-26 NOTE — Patient Instructions (Signed)
Labs done today, your results will be available in MyChart, we will contact you for abnormal readings.   Follow up appointment in 3 months   Your physician has requested that you have an echocardiogram. Echocardiography is a painless test that uses sound waves to create images of your heart. It provides your doctor with information about the size and shape of your heart and how well your hearts chambers and valves are working. This procedure takes approximately one hour. There are no restrictions for this procedure.    If you have any questions or concerns before your next appointment please send [name] a message through [place] or call our office at [phone].    TO LEAVE A MESSAGE FOR THE NURSE SELECT OPTION 2, PLEASE LEAVE A MESSAGE INCLUDING:  YOUR NAME  DATE OF BIRTH  CALL BACK NUMBER  REASON FOR CALL**this is important as we prioritize the call backs  YOU WILL RECEIVE A CALL BACK THE SAME DAY AS LONG AS YOU CALL BEFORE 4:00 PM   At the [facility], you and your health needs are our priority. As part of our continuing mission to provide you with exceptional heart care, we have created designated Provider Care Teams. These Care Teams include your primary Cardiologist (physician) and Advanced Practice Providers (APPs- Physician Assistants and Nurse Practitioners) who all work together to provide you with the care you need, when you need it.   You may see any of the following providers on your designated Care Team at your next follow up:  Dr Glori Bickers  Dr Haynes Kerns, NP  Lyda Jester, [place]  Audry Riles, PharmD   Please be sure to bring in all your medications bottles to every appointment.

## 2019-10-26 NOTE — Telephone Encounter (Signed)
Patient request if labs can be drawn that Dr Dorris Fetch ordered here in office visit today with DM. Please advise

## 2019-10-28 NOTE — Progress Notes (Signed)
Date:  10/28/2019   ID:  [name], DOB Aug 02, 1960, MRN [id number]  Provider location: [facility] Type of Visit: Established patient  PCP:  [name], MD  HF Cardiology: Dr. [name]   History of Present Illness: Stephanie Manning is a 59 y.o. female who has a history of CAD and ischemic cardiomyopathy.  In 12/18, she had anterior MI with DES to totally occluded mid LAD (culprit). She returned in 1/19 for staged DES to 80% RCA stenosis. Echo in 12/18 showed EF 30-35%.  Repeat echo in 3/19 showed EF stable at 30-35%.    She was admitted in 7/19 with chest pain, concern for unstable angina.  Culprit of symptoms was thought to be 95% stenosis in a small PLV vessel, medical treatment planned.   She saw Dr. Rayann Heman and decided against ICD.   Echo was done in 6/20 showing stable EF 30-35%.   She was unable to tolerate Bidil 1/2 tablet tid due to lightheadedness. She was unable to tolerate dapagliflozin.   She returns for followup of CHF.  No chest pain.  No dyspnea walking on flat ground.  She does get short of breath carrying heavy loads.  No orthopnea/PND. No lightheadedness.  Weight is down 4 lbs.   ECG (personally reviewed): NSR, anterolateral TWIs, inferior TWIs  Labs (2/19): K 4.7, creatinine 1.48, hgb 8.4 Labs (5/19): LDL 65, K 4.7, creatinine 1.66 Labs (8/19): K 5, creatinine 1.78  Labs (9/19): K 3.8, creatinine 1.6 Labs (12/19): K 3.9, creatinine 1.59 Labs (3/20): LDL 73, HDL 41 Labs (5/20): K 4.1, creatinine 1.59, LDL 73 Labs (9/20): K 4.2, creatinine 1.6 Labs (11/20): K 4.1, creatinine 1.7, LDL 50, HDL 32 Labs (3/21): K 4.1, creatinine 1.67  PMH: 1. Type II diabetes 2. CKD stage 3.  3. Hyperlipidemia 4. HTN 5. Fe deficiency anemia 6. GERD 7. CAD: Anterior MI in 12/18 with DES to 100% stenosed mid LAD (culprit), also with CTO of distal LAD, 80% pRCA, 85% mLCx/90% dLCx, 90% OM3.  - 1/19 staged PCI of proximal RCA.  -  Admitted with unstable angina 7/19.  LHC: 75% stenosis proximal-mid LCx, 60% OM1, total occlusion of the distal LAD, patent mid LAD stent, patent RCA stents, 95% stenosis in small PLV branch (likely culprit).  8. Chronic systolic CHF: Ischemic cardiomyopathy.  - Echo (12/18): EF 30-35%.  - Echo (3/19): EF 30-35%, WMAs in LAD territory, RV mildly dilated.  - Echo (6/20): EF 30-35%, mild LV dilation, normal RV size and systolic function.   Current Outpatient Medications  Medication Sig Dispense Refill  . Accu-Chek Softclix Lancets lancets Use as instructed 100 each 2  . aspirin 81 MG chewable tablet Chew 1 tablet (81 mg total) by mouth daily.    Marland Kitchen atorvastatin (LIPITOR) 80 MG tablet Take 1 tablet (80 mg total) by mouth daily at 6 PM. NEEDS APPOINTMENT WITH DR [name] FOR FUTURE REFILLS 90 tablet 3  . blood glucose meter kit and supplies Dispense based on patient and insurance preference. Use up to four times daily as directed. (FOR ICD-10 E11.65) Pt prefers One touch mini 1 each 5  . carvedilol (COREG) 12.5 MG tablet Take 1 tablet (12.5 mg total) by mouth 2 (two) times daily with a meal. 180 tablet 3  . ELDERBERRY PO Take 2 tablets by mouth every other day.     . ferrous sulfate 300 (60 Fe) MG/5ML syrup Take 300 mg by mouth daily with breakfast.    .  gabapentin (NEURONTIN) 300 MG capsule Take 300 mg by mouth at bedtime.     Marland Kitchen glipiZIDE (GLUCOTROL XL) 5 MG 24 hr tablet Take 1 tablet (5 mg total) by mouth daily with breakfast. 90 tablet 0  . glucose blood (ACCU-CHEK GUIDE) test strip TEST 4 TIMES DAILY BEFORE MEALS AND AT BEDTIME Dx: E11.59 150 each 5  . isosorbide mononitrate (IMDUR) 30 MG 24 hr tablet Take 1 tablet (30 mg total) by mouth daily. 90 tablet 3  . LEVEMIR FLEXTOUCH 100 UNIT/ML FlexPen INJECT 50 UNITS UNDER THE SKIN EVERY NIGHT AT BEDTIME 30 mL 0  . Multiple Vitamin (MULTIVITAMIN WITH MINERALS) TABS tablet Take 1 tablet by mouth daily.    . nitroGLYCERIN (NITROSTAT) 0.4 MG SL tablet  DISSOLVE 1 TAB UNDER TONGUE FOR CHEST PAIN - IF PAIN REMAINS AFTER 5 MIN, CALL 911 AND REPEAT DOSE. MAX 3 TABS IN 15 MINUTES (Patient taking differently: Place 0.4 mg under the tongue every 5 (five) minutes as needed for chest pain. ) 25 tablet 0  . pantoprazole (PROTONIX) 40 MG tablet Take 1 tablet (40 mg total) by mouth 2 (two) times daily. 180 tablet 1  . sacubitril-valsartan (ENTRESTO) 97-103 MG Take 1 tablet by mouth 2 (two) times daily. 180 tablet 3  . Saline 0.2 % SOLN Place 1 spray into both nostrils as needed (congestion).     Marland Kitchen spironolactone (ALDACTONE) 25 MG tablet Take 1 tablet (25 mg total) by mouth every evening. 90 tablet 3  . ticagrelor (BRILINTA) 60 MG TABS tablet Take 1 tablet (60 mg total) by mouth 2 (two) times daily. 180 tablet 5  . tiZANidine (ZANAFLEX) 4 MG tablet Take 4 mg by mouth at bedtime.     Marland Kitchen VITAMIN D PO Take 1 tablet by mouth 2 (two) times a week. Mondays and Wednesdays     No current facility-administered medications for this encounter.    Allergies:   Hydralazine, Bidil [isosorb dinitrate-hydralazine], Ciprofloxacin, Farxiga [dapagliflozin], Amoxicillin, Hydrocodone, Lisinopril, Meloxicam, Strawberry extract, Tape, and Toradol [ketorolac tromethamine]   Social History:  The patient  reports that she has never smoked. She has never used smokeless tobacco. She reports that she does not drink alcohol and does not use drugs.   Family History:  The patient's family history includes Diabetes in her mother; Heart disease in her mother; Hypertension in her mother; Stroke in her mother.   ROS:  Please see the history of present illness.   All other systems are personally reviewed and negative.   Exam:   BP 118/62   Pulse 65   Wt 111.9 kg (246 lb 12.8 oz)   SpO2 99%   BMI 36.98 kg/m  General: NAD Neck: No JVD, no thyromegaly or thyroid nodule.  Lungs: Clear to auscultation bilaterally with normal respiratory effort. CV: Nondisplaced PMI.  Heart regular S1/S2,  no S3/S4, no murmur.  No peripheral edema.  No carotid bruit.  Normal pedal pulses.  Abdomen: Soft, nontender, no hepatosplenomegaly, no distention.  Skin: Intact without lesions or rashes.  Neurologic: Alert and oriented x 3.  Psych: Normal affect. Extremities: No clubbing or cyanosis.  HEENT: Normal.   Recent Labs: 01/14/2019: B Natriuretic Peptide 151.3; TSH 2.377 06/05/2019: ALT 14; Hemoglobin 9.8; Platelets 183 10/26/2019: BUN 31; Creatinine, Ser 1.86; Magnesium 1.9; Potassium 4.0; Sodium 139  Personally reviewed   Wt Readings from Last 3 Encounters:  10/26/19 111.9 kg (246 lb 12.8 oz)  06/22/19 111.2 kg (245 lb 3.2 oz)  05/22/19 113.8 kg (250 lb  12.8 oz)    ASSESSMENT AND PLAN:  1. CAD: Anterior MI in 12/18 with DES to proximal LAD (culprit) followed by staged DES to proximal RCA.  7/19 admission with unstable angina, likely culprit was 95% stenosis in small PLV.  This was managed conservatively.  No recent chest pain.       - Continue atorvastatin, check lipids today.     - Continue ASA 81 daily and ticagrelor 60 mg bid.  - Continue Imdur 30 mg daily.  2. Chronic systolic CHF: Ischemic cardiomyopathy. EF has been stably low in the 30-35% range (no change on 6/20 echo).  NYHA class II symptoms.  She is not volume overloaded.  She saw Dr. Rayann Heman and decided against ICD. Narrow QRS, so not CRT candidate.  - Continue Coreg 12.5 mg bid.     - Continue Entresto 97/103 bid. BMET today.   - Can continue prn use of Lasix.  - Continue spironolactone 25 mg daily.    - She was unable to tolerate Bidil at lowest does due to lightheadedness.   - She was unable to tolerate dapagliflozin.   - We discussed ICD again, she is not interested at this time but understands the rationale behind getting one.  - I will arrange for repeat echo.   3. CKD: stage 3.    4. Fe deficiency anemia: Long-standing.  She has had IV Fe infusions.  5. Type II DM: Unable to tolerate dapagliflozin.   Followup in 3  months with echo.    Signed, Loralie Champagne, MD  10/28/2019  [facility] [address] [facility] [place] [address] [phone] (office) [phone] (fax)

## 2019-11-05 ENCOUNTER — Telehealth (INDEPENDENT_AMBULATORY_CARE_PROVIDER_SITE_OTHER): Payer: Medicare Other | Admitting: Nurse Practitioner

## 2019-11-05 ENCOUNTER — Encounter: Payer: Self-pay | Admitting: Nurse Practitioner

## 2019-11-05 VITALS — Wt 243.0 lb

## 2019-11-05 DIAGNOSIS — E1121 Type 2 diabetes mellitus with diabetic nephropathy: Secondary | ICD-10-CM | POA: Diagnosis not present

## 2019-11-05 DIAGNOSIS — E782 Mixed hyperlipidemia: Secondary | ICD-10-CM

## 2019-11-05 DIAGNOSIS — Z794 Long term (current) use of insulin: Secondary | ICD-10-CM

## 2019-11-05 DIAGNOSIS — N1832 Chronic kidney disease, stage 3b: Secondary | ICD-10-CM

## 2019-11-05 DIAGNOSIS — I10 Essential (primary) hypertension: Secondary | ICD-10-CM | POA: Diagnosis not present

## 2019-11-05 DIAGNOSIS — I255 Ischemic cardiomyopathy: Secondary | ICD-10-CM

## 2019-11-05 MED ORDER — GLIPIZIDE ER 5 MG PO TB24: 5.0000 mg | ORAL_TABLET | Freq: Every day | ORAL | 3 refills | Status: DC

## 2019-11-05 MED ORDER — TRULICITY 1.5 MG/0.5ML ~~LOC~~ SOAJ: 1.5000 mg | SUBCUTANEOUS | 3 refills | Status: DC

## 2019-11-05 NOTE — Patient Instructions (Signed)

## 2019-11-05 NOTE — Progress Notes (Signed)
11/05/2019, 1:46 PM  Endocrinology Follow Up Visit  TELEHEALTH VISIT: The patient is being engaged in telehealth visit due to COVID-19.  This type of visit limits physical examination significantly, and thus is not preferable over face-to-face encounters.  I connected with  Stephanie Manning on 11/05/19 by a video enabled telemedicine application and verified that I am speaking with the correct person using two identifiers.   I discussed the limitations of evaluation and management by telemedicine. The patient expressed understanding and agreed to proceed.    The participants involved in this visit include: Brita Romp, NP located at [facility] and Stephanie Manning located at their personal residence listed.    Subjective:    Patient ID: Stephanie Manning, female    DOB: 05-12-1960.  Stephanie Manning is being seen in follow-up  for management of currently uncontrolled symptomatic diabetes requested by  [name], MD.   Past Medical History:  Diagnosis Date   AKI (acute kidney injury) ([place]) 02/2017   CAD in native artery    a. NSTEMI 02/2017 s/p DES to mLAD, diffuse disease otherwise, EF 30-35%. b. Staged cath 03/2017 - patent stent in the proximal to mid LAD. CTO of the distal LAD, 90% diffuse small first diagonal, 90% distal LCx/OM, 80% diffuse prox RCA, moderately elevated LVEDP 44mHg, s/p DES to mRCA, residual disease treated medically (too small for PCI), EF 30-35%.   Chronic combined systolic and diastolic CHF (congestive heart failure) [place]    CKD (chronic kidney disease), stage IV ([facility]    /[name]2/09/2017   Diabetes mellitus, type II, insulin dependent (HCC)    GERD (gastroesophageal reflux disease)    History of blood transfusion    "related to menses"   History of stomach ulcers 2017   Hyperlipidemia    Hypertension    Iron  deficiency anemia 03/2017   "had to have iron infusions" (10/10/2017)   Ischemic cardiomyopathy    STEMI (ST elevation myocardial infarction) ([facility] 02/2017    Past Surgical History:  Procedure Laterality Date   CESAREAN SECTION  1987   CORONARY ANGIOPLASTY WITH STENT PLACEMENT  04/01/2017   drug-eluting stent was successfully placed using a STENT SYNERGY DES [id number]   CORONARY STENT INTERVENTION N/A 04/01/2017   Procedure: CORONARY STENT INTERVENTION;  Surgeon: JMartinique [name], MD;  [facility];  Service: Cardiovascular;  Laterality: N/A;   CORONARY/GRAFT ACUTE MI REVASCULARIZATION N/A 03/10/2017   Procedure: Coronary/Graft Acute MI Revascularization;  Surgeon: JMartinique [name], MD;  [facility];  Service: Cardiovascular;  Laterality: N/A;   LEFT HEART CATH AND CORONARY ANGIOGRAPHY N/A 03/10/2017   Procedure: LEFT HEART CATH AND CORONARY ANGIOGRAPHY;  Surgeon: JMartinique [name], MD;  [facility];  Service: Cardiovascular;  Laterality: N/A;   LEFT HEART CATH AND CORONARY ANGIOGRAPHY N/A 04/01/2017   Procedure: LEFT HEART CATH AND CORONARY ANGIOGRAPHY;  Surgeon: JMartinique [name], MD;  [facility];  Service: Cardiovascular;  Laterality: N/A;   LEFT HEART CATH AND CORONARY ANGIOGRAPHY N/A 10/12/2017   Procedure: LEFT HEART CATH  AND CORONARY ANGIOGRAPHY;  Surgeon: Sherren Mocha, MD;  Location: [facility];  Service: Cardiovascular;  Laterality: N/A;   OOPHORECTOMY     "1 ovary removed"   THROAT SURGERY  02/2014   "nodule in my throat removed; not related to thyroid"    Social History   Socioeconomic History   Marital status: Widowed    Spouse name: Not on file   Number of children: 2   Years of education: Not on file   Highest education level: Not on file  Occupational History   Not on file  Tobacco Use   Smoking status: Never Smoker   Smokeless tobacco: Never Used  Vaping Use   Vaping Use:  Never used  Substance and Sexual Activity   Alcohol use: Never   Drug use: Never   Sexual activity: Yes  Other Topics Concern   Not on file  Social History Narrative   Not on file   Social Determinants of Health   Financial Resource Strain:    Difficulty of Paying Living Expenses: Not on file  Food Insecurity:    Worried About [place] in the Last Year: Not on file   Ran Out of Food in the Last Year: Not on file  Transportation Needs:    Lack of Transportation (Medical): Not on file   Lack of Transportation (Non-Medical): Not on file  Physical Activity:    Days of Exercise per Week: Not on file   Minutes of Exercise per Session: Not on file  Stress:    Feeling of Stress : Not on file  Social Connections:    Frequency of Communication with Friends and Family: Not on file   Frequency of Social Gatherings with Friends and Family: Not on file   Attends Religious Services: Not on file   Active Member of Clubs or Organizations: Not on file   Attends Archivist Meetings: Not on file   Marital Status: Not on file    Family History  Problem Relation Age of Onset   Hypertension Mother    Heart disease Mother    Diabetes Mother    Stroke Mother     Outpatient Encounter Medications as of 11/05/2019  Medication Sig   Accu-Chek Softclix Lancets lancets Use as instructed   aspirin 81 MG chewable tablet Chew 1 tablet (81 mg total) by mouth daily.   atorvastatin (LIPITOR) 80 MG tablet Take 1 tablet (80 mg total) by mouth daily at 6 PM. NEEDS APPOINTMENT WITH DR [name] FOR FUTURE REFILLS   blood glucose meter kit and supplies Dispense based on patient and insurance preference. Use up to four times daily as directed. (FOR ICD-10 E11.65) Pt prefers One touch mini   carvedilol (COREG) 12.5 MG tablet Take 1 tablet (12.5 mg total) by mouth 2 (two) times daily with a meal.   Dulaglutide (TRULICITY) 1.5 ZO/1.0RU SOPN Inject 0.5 mLs (1.5 mg  total) into the skin once a week.   ELDERBERRY PO Take 2 tablets by mouth every other day.    ferrous sulfate 300 (60 Fe) MG/5ML syrup Take 300 mg by mouth daily with breakfast.   gabapentin (NEURONTIN) 300 MG capsule Take 300 mg by mouth at bedtime.    glipiZIDE (GLUCOTROL XL) 5 MG 24 hr tablet Take 1 tablet (5 mg total) by mouth daily with breakfast.   glucose blood (ACCU-CHEK GUIDE) test strip TEST 4 TIMES DAILY BEFORE MEALS AND AT BEDTIME Dx: E11.59   isosorbide mononitrate (IMDUR) 30 MG  24 hr tablet Take 1 tablet (30 mg total) by mouth daily.   LEVEMIR FLEXTOUCH 100 UNIT/ML FlexPen INJECT 50 UNITS UNDER THE SKIN EVERY NIGHT AT BEDTIME   Multiple Vitamin (MULTIVITAMIN WITH MINERALS) TABS tablet Take 1 tablet by mouth daily.   nitroGLYCERIN (NITROSTAT) 0.4 MG SL tablet DISSOLVE 1 TAB UNDER TONGUE FOR CHEST PAIN - IF PAIN REMAINS AFTER 5 MIN, CALL 911 AND REPEAT DOSE. MAX 3 TABS IN 15 MINUTES (Patient taking differently: Place 0.4 mg under the tongue every 5 (five) minutes as needed for chest pain. )   pantoprazole (PROTONIX) 40 MG tablet Take 1 tablet (40 mg total) by mouth 2 (two) times daily.   sacubitril-valsartan (ENTRESTO) 97-103 MG Take 1 tablet by mouth 2 (two) times daily.   Saline 0.2 % SOLN Place 1 spray into both nostrils as needed (congestion).    spironolactone (ALDACTONE) 25 MG tablet Take 1 tablet (25 mg total) by mouth every evening.   ticagrelor (BRILINTA) 60 MG TABS tablet Take 1 tablet (60 mg total) by mouth 2 (two) times daily.   tiZANidine (ZANAFLEX) 4 MG tablet Take 4 mg by mouth at bedtime.    VITAMIN D PO Take 1 tablet by mouth 2 (two) times a week. Mondays and Wednesdays   [DISCONTINUED] glipiZIDE (GLUCOTROL XL) 5 MG 24 hr tablet Take 1 tablet (5 mg total) by mouth daily with breakfast.   No facility-administered encounter medications on file as of 11/05/2019.    ALLERGIES: Allergies  Allergen Reactions   Hydralazine Other (See Comments)     Syncope due to hypotension   Bidil [Isosorb Dinitrate-Hydralazine]    Ciprofloxacin    Farxiga [Dapagliflozin]    Amoxicillin Other (See Comments)    Yeast infection during treatment   Hydrocodone Palpitations, Rash and Other (See Comments)    GI upset, also   Lisinopril Palpitations and Other (See Comments)    Chest pain, too   Meloxicam Nausea Only   Strawberry Extract Hives    Does not always happen   Tape Rash    Some tapes cause rashes   Toradol [Ketorolac Tromethamine] Palpitations    VACCINATION STATUS:  There is no immunization history on file for this patient.  Diabetes She presents for her follow-up diabetic visit. She has type 2 diabetes mellitus. Onset time: She was diagnosed at approximate age of 56 years. Her disease course has been fluctuating. There are no hypoglycemic associated symptoms. Pertinent negatives for hypoglycemia include no confusion, headaches, pallor or seizures. Associated symptoms include fatigue and polyuria. Pertinent negatives for diabetes include no chest pain, no polydipsia and no polyphagia. There are no hypoglycemic complications. Symptoms are improving. Diabetic complications include heart disease, nephropathy, peripheral neuropathy and retinopathy. Risk factors for coronary artery disease include diabetes mellitus, dyslipidemia, hypertension, obesity, sedentary lifestyle and post-menopausal. Current diabetic treatment includes oral agent (monotherapy) and insulin injections. She is compliant with treatment most of the time. Her weight is fluctuating minimally. She is following a generally unhealthy diet. When asked about meal planning, she reported none. She has not had a previous visit with a dietitian. She never participates in exercise. Her home blood glucose trend is fluctuating minimally. Her overall blood glucose range is 140-180 mg/dl. (She presents today for her virtual visit with her meter and logs showing inconsistent blood glucose  monitoring. Her 7-day blood glucose average is 186, her 14-day average is 162, 30-day average is 152, and 90-day averages 174. Her previsit A1c is 8.3%, worsening from her visit in April  of 7.2%. She reports that she was on prednisone for URI symptoms for about 2 weeks. She denies any hypoglycemia. Blood glucose readings are as follows:  8/20- AM:288; PM:147  8/17- AM:147; PM: 123 8/16- AM: 83 8/14- lunch:197 8/13- AM:183 8/12- lunch: 88 8/11- AM: 217; PM:82  ) An ACE inhibitor/angiotensin II receptor blocker is being taken. She does not see a podiatrist.Eye exam is current.  Hyperlipidemia This is a chronic problem. The current episode started more than 1 year ago. The problem is controlled. Recent lipid tests were reviewed and are normal. Exacerbating diseases include chronic renal disease, diabetes and obesity. Factors aggravating her hyperlipidemia include fatty foods. Pertinent negatives include no chest pain, myalgias or shortness of breath. Current antihyperlipidemic treatment includes statins. The current treatment provides mild improvement of lipids. Compliance problems include adherence to diet.  Risk factors for coronary artery disease include diabetes mellitus, dyslipidemia, hypertension, obesity, post-menopausal and family history.  Hypertension This is a chronic problem. The current episode started more than 1 year ago. The problem has been gradually improving since onset. The problem is controlled. Pertinent negatives include no chest pain, headaches, palpitations or shortness of breath. There are no associated agents to hypertension. Risk factors for coronary artery disease include diabetes mellitus, dyslipidemia, obesity, sedentary lifestyle and post-menopausal state. Past treatments include beta blockers, diuretics and angiotensin blockers. The current treatment provides moderate improvement. There are no compliance problems.  Hypertensive end-organ damage includes kidney disease,  CAD/MI and retinopathy. Identifiable causes of hypertension include chronic renal disease.    Review of systems  Constitutional: + Minimally fluctuating body weight,  current  Body mass index is 36.41 kg/m. , + fatigue (says side effect from gabapentin), no subjective hyperthermia, no subjective hypothermia Eyes: no blurry vision, no xerophthalmia ENT: no sore throat, no nodules palpated in throat, no dysphagia/odynophagia, no hoarseness Cardiovascular: no Chest Pain, no Shortness of Breath, no palpitations, no leg swelling Respiratory: no cough, no shortness of breath Gastrointestinal: no Nausea/Vomiting/Diarhhea Musculoskeletal: no muscle/joint aches Skin: no rashes, no hyperemia Neurological: no tremors, no numbness, no tingling, no dizziness Psychiatric: no depression, no anxiety   Objective:    Wt 243 lb (110.2 kg)    BMI 36.41 kg/m   Wt Readings from Last 3 Encounters:  11/05/19 243 lb (110.2 kg)  10/26/19 246 lb 12.8 oz (111.9 kg)  06/22/19 245 lb 3.2 oz (111.2 kg)    BP Readings from Last 3 Encounters:  10/26/19 118/62  06/22/19 115/71  05/22/19 (!) 146/76     Physical Exam- Telehealth-significantly limited due to nature of visit  Constitutional: Body mass index is 36.41 kg/m. , not in acute distress, normal state of mind Respiratory: Adequate breathing efforts   CMP     Component Value Date/Time   NA 139 10/26/2019 1238   NA 142 10/19/2017 1600   K 4.0 10/26/2019 1238   CL 109 10/26/2019 1238   CO2 20 (L) 10/26/2019 1238   GLUCOSE 174 (H) 10/26/2019 1238   BUN 31 (H) 10/26/2019 1238   BUN 17 07/14/2018 0000   CREATININE 1.86 (H) 10/26/2019 1238   CREATININE 1.60 (H) 11/27/2018 1521   CALCIUM 8.8 (L) 10/26/2019 1238   PROT 7.0 06/05/2019 1142   ALBUMIN 3.4 (L) 06/05/2019 1142   AST 18 06/05/2019 1142   ALT 14 06/05/2019 1142   ALKPHOS 75 06/05/2019 1142   BILITOT 0.4 06/05/2019 1142   GFRNONAA 29 (L) 10/26/2019 1238   GFRNONAA 35 (L) 11/27/2018  1521   GFRAA  33 (L) 10/26/2019 1238   GFRAA 40 (L) 11/27/2018 1521     Diabetic Labs (most recent): Lab Results  Component Value Date   HGBA1C 8.3 (H) 10/26/2019   HGBA1C 7.2 (A) 06/22/2019   HGBA1C 7.7 (H) 01/14/2019     Lipid Panel ( most recent) Lipid Panel     Component Value Date/Time   CHOL 123 10/26/2019 1238   TRIG 69 10/26/2019 1238   HDL 33 (L) 10/26/2019 1238   CHOLHDL 3.7 10/26/2019 1238   VLDL 14 10/26/2019 1238   LDLCALC 76 10/26/2019 1238        Assessment & Plan:   1. DM type 2 causing vascular disease    - Stephanie Manning has currently uncontrolled symptomatic type 2 DM since  59 years of age.  She presents today for her virtual visit with her meter and logs showing inconsistent blood glucose monitoring. Her 7-day blood glucose average is 186, her 14-day average is 162, 30-day average is 152, and 90-day averages 174. Her previsit A1c is 8.3%, worsening from her visit in April of 7.2%. She reports that she was on prednisone for URI symptoms for about 2 weeks. She denies any hypoglycemia. Blood glucose readings are as follows:  8/20- AM:288; PM:147  8/17- AM:147; PM: 123 8/16- AM: 83 8/14- lunch:197 8/13- AM:183 8/12- lunch: 88 8/11- AM: 217; PM:82   - I had a long discussion with her about the progressive nature of diabetes and the pathology behind its complications. -her diabetes is complicated by retinopathy, nephropathy, coronary artery disease and she remains at a high risk for more acute and chronic complications which include CAD, CVA, CKD, retinopathy, and neuropathy. These are all discussed in detail with her.  - The patient admits there is a room for improvement in their diet and drink choices. -  Suggestion is made for the patient to avoid simple carbohydrates from their diet including Cakes, Sweet Desserts / Pastries, Ice Cream, Soda (diet and regular), Sweet Tea, Candies, Chips, Cookies, Sweet Pastries,  Store Bought Juices, Alcohol in  Excess of  1-2 drinks a day, Artificial Sweeteners, Coffee Creamer, and "Sugar-free" Products. This will help patient to have stable blood glucose profile and potentially avoid unintended weight gain.   - I encouraged the patient to switch to  unprocessed or minimally processed complex starch and increased protein intake (animal or plant source), fruits, and vegetables.   - Patient is advised to stick to a routine mealtimes to eat 3 meals  a day and avoid unnecessary snacks ( to snack only to correct hypoglycemia).  - I have approached her with the following individualized plan to manage  her diabetes and patient agrees:   - she will benefit from simplification of her treatment regimen.  She will not need prandial insulin for now.    -She is advised to continue current dose of Levemir at 50 units SQ daily at bedtime, and continue glipizide 5 mg XL daily with breakfast.  -She is advised to continue monitoring blood glucose at least twice a day, before breakfast and before bed and report blood glucose less than 70 or above 300 for three tests in a row.  -Discussed and initiated incretin therapy with Trulicity. Sample will be provided for Trulicity 3.15 mg SQ weekly. If patient tolerates well can increase dose to 1.5 mg weekly. She did not do well with SGLT2 inhibitor Wilder Glade when it was prescribed by her cardiologist as it caused significant hypotension.  - she is warned  not to take insulin without proper monitoring per orders.  - she is not a candidate for metformin, SGLT2 inhibitors due to concurrent renal insufficiency.  - Patient specific target  A1c;  LDL, HDL, Triglycerides, and  Waist Circumference were discussed in detail.  2) Blood Pressure /Hypertension:  -Her blood pressure is controlled to target according to previous visits. She is advised to continue carvedilol 12.5 mg p.o. twice daily, isosorbide mononitrate 30 mg p.o. daily, Entresto 97-103 mg p.o. twice daily, and Aldactone 25  mg p.o. daily.  3) Lipids/Hyperlipidemia:    Review of her recent lipid panel from 2019-10-26 shows controlled LDL at 76. She is advised to continue her Lipitor 80 mg p.o. daily at bedtime. Side effects and precautions discussed with her.  4)  Weight/Diet:  Her Body mass index is 36.41 kg/m.-   clearly complicating her diabetes care.  She is a candidate for modest weight loss.  I discussed with her the fact that loss of 5 - 10% of her  current body weight will have the most impact on her diabetes management.  CDE Consult will be initiated . Exercise, and detailed carbohydrates information provided  -  detailed on discharge instructions.  5) Chronic Care/Health Maintenance:  -she  is on ACE/ARB and statin medications and is encouraged to initiate and continue to follow up with Ophthalmology, Dentist,  Podiatrist at least yearly or according to recommendations, and advised to   stay away from smoking. I have recommended yearly flu vaccine and pneumonia vaccine at least every 5 years; moderate intensity exercise for up to 150 minutes weekly; and  sleep for at least 7 hours a day.  - she is  advised to maintain close follow up with Margo Common, Jarvis Newcomer, MD for primary care needs, as well as her other providers for optimal and coordinated care.  I spent 35 minutes dedicated to the care of this patient on the date of this encounter to include pre-visit review of records, face-to-face time with the patient, and post visit ordering of  Testing.    Please refer to Patient Instructions for Blood Glucose Monitoring and Insulin/Medications Dosing Guide"  in media tab for additional information. Please  also refer to " Patient Self Inventory" in the Media  tab for reviewed elements of pertinent patient history.  Stephanie Manning participated in the discussions, expressed understanding, and voiced agreement with the above plans.  All questions were answered to her satisfaction. she is encouraged to contact  clinic should she have any questions or concerns prior to her return visit.   Follow up plan: - Return in about 4 months (around 03/06/2020) for Diabetes follow up, Previsit labs, Virtual visit ok.  Rayetta Pigg, FNP-BC [facility] Phone: [phone] Fax: [phone]   11/05/2019, 1:46 PM  This note was partially dictated with voice recognition software. Similar sounding words can be transcribed inadequately or may not  be corrected upon review.

## 2019-11-06 ENCOUNTER — Other Ambulatory Visit: Payer: Self-pay | Admitting: Cardiovascular Disease

## 2019-11-13 NOTE — Progress Notes (Deleted)
Cardiology Office Note    Date:  11/13/2019   ID:  Stephanie Manning, Stephanie Manning 1960/12/08, MRN [id number]  PCP:  [name], MD  Cardiologist: Dr. [name] Heart failure clinic: Dr. [name]  No chief complaint on file.   History of Present Illness:  Stephanie Manning is a 59 y.o. female with PMH of CAD, DM II, CKD stage IV, HTN, HLD, CAD and ICM.  In December 2018 she had anterior MI with DES to totally occluded mid LAD.  She returned in January 2019 for staged DES to 80% RCA disease.  Echocardiogram in December 2018 showed ejection fraction of 30 to 35%.  Repeat echocardiogram in March 2019 continue to show ejection fraction 30 to 35%.  Despite intervention, she still has intermittent chest discomfort.  She was referred to Dr. [name] for management of heart failure.  She was last seen in the heart failure clinic on 09/27/2017, she was on carvedilol, Entresto was increased to 49-51 mg twice daily, Lasix reduced to 20 mg every other day and she is also on spironolactone 12.5 mg daily.  Given her persistent low ejection fraction, she was referred to electrophysiology service for consideration of ICD.  She was admitted to the hospital on 10/10/2017 with recurrent chest pain.  She underwent Myoview which came back high risk.  She eventually underwent cardiac catheterization on 10/12/2017 which showed 75% prox LCx, 60% OM1, 40% LM, 100% mid LAD chronic occlusion, 95% post atrio lesion.  The culprit lesion was felt to be post atrial lesion, however best management was medical therapy given the small caliber of the artery.  She was recommended to continue aspirin and Brilinta for long-term, beyond 72-month  I increased her Imdur to 60 mg daily.  During this admission, carvedilol was reduced to 6.25 mg twice daily due to bradycardia, Mobitz type I and up to 2 dropped beats and dizziness.  Her Entresto was increased  twice daily for high blood pressure.  She has been followed closely by Dr MAundra Dubinin the  CHF clinic last seen there this month. She is on Coreg and aldactone. She had follow up with Echo in June 2020 and EF was unchanged. She has been seen by Dr ARayann Hemanfor consideration of ICD but has deferred. She is intolerant of Bidil due to lightheadedness. Plan repeat  Echo in 3 months.   On follow up today she is doing quite well. Denies  increased dyspnea. She did contract Covid in January but fortunately had a mild case with slight fever and chills. No increased edema. Careful with her salt intake. Notes some cramps in right upper abdomen and right hand. Rare chest pain- not bad. Did note some SOB with walking but it is better now.    Past Medical History:  Diagnosis Date  . AKI (acute kidney injury) ([facility] 02/2017  . CAD in native artery    a. NSTEMI 02/2017 s/p DES to mLAD, diffuse disease otherwise, EF 30-35%. b. Staged cath 03/2017 - patent stent in the proximal to mid LAD. CTO of the distal LAD, 90% diffuse small first diagonal, 90% distal LCx/OM, 80% diffuse prox RCA, moderately elevated LVEDP 274mg, s/p DES to mRCA, residual disease treated medically (too small for PCI), EF 30-35%.  . Chronic combined systolic and diastolic CHF (congestive heart failure) ([facility]  . CKD (chronic kidney disease), stage IV ([facility]   /nArchie Endo/09/2017  . Diabetes mellitus, type II, insulin dependent ([facility]  . GERD (gastroesophageal reflux disease)   .  History of blood transfusion    "related to menses"  . History of stomach ulcers 2017  . Hyperlipidemia   . Hypertension   . Iron deficiency anemia 03/2017   "had to have iron infusions" (10/10/2017)  . Ischemic cardiomyopathy   . STEMI (ST elevation myocardial infarction) ([place]) 02/2017    Past Surgical History:  Procedure Laterality Date  . CESAREAN SECTION  1987  . CORONARY ANGIOPLASTY WITH STENT PLACEMENT  04/01/2017   drug-eluting stent was successfully placed using a STENT SYNERGY DES 6.73A19  . CORONARY STENT INTERVENTION N/A 04/01/2017    Procedure: CORONARY STENT INTERVENTION;  Surgeon: [name], Evah Rashid M, MD;  Location: [place] [facility];  Service: Cardiovascular;  Laterality: N/A;  . CORONARY/GRAFT ACUTE MI REVASCULARIZATION N/A 03/10/2017   Procedure: Coronary/Graft Acute MI Revascularization;  Surgeon: [name], Taeshawn Helfman M, MD;  Location: [place] [facility];  Service: Cardiovascular;  Laterality: N/A;  . LEFT HEART CATH AND CORONARY ANGIOGRAPHY N/A 03/10/2017   Procedure: LEFT HEART CATH AND CORONARY ANGIOGRAPHY;  Surgeon: [name], Koree Schopf M, MD;  Location: [place] [facility];  Service: Cardiovascular;  Laterality: N/A;  . LEFT HEART CATH AND CORONARY ANGIOGRAPHY N/A 04/01/2017   Procedure: LEFT HEART CATH AND CORONARY ANGIOGRAPHY;  Surgeon: [name], Luticia Tadros M, MD;  Location: [place] [facility];  Service: Cardiovascular;  Laterality: N/A;  . LEFT HEART CATH AND CORONARY ANGIOGRAPHY N/A 10/12/2017   Procedure: LEFT HEART CATH AND CORONARY ANGIOGRAPHY;  Surgeon: Sherren Mocha, MD;  Location: [place] [facility];  Service: Cardiovascular;  Laterality: N/A;  . OOPHORECTOMY     "1 ovary removed"  . THROAT SURGERY  02/2014   "nodule in my throat removed; not related to thyroid"    Current Medications: Outpatient Medications Prior to Visit  Medication Sig Dispense Refill  . Accu-Chek Softclix Lancets lancets Use as instructed 100 each 2  . aspirin 81 MG chewable tablet Chew 1 tablet (81 mg total) by mouth daily.    Marland Kitchen atorvastatin (LIPITOR) 80 MG tablet Take 1 tablet (80 mg total) by mouth daily at 6 PM. NEEDS APPOINTMENT WITH DR [name] FOR FUTURE REFILLS 90 tablet 3  . blood glucose meter kit and supplies Dispense based on patient and insurance preference. Use up to four times daily as directed. (FOR ICD-10 E11.65) Pt prefers One touch mini 1 each 5  . carvedilol (COREG) 12.5 MG tablet Take 1 tablet (12.5 mg total) by mouth 2 (two) times daily with a meal. 180 tablet 3  . Dulaglutide (TRULICITY) 1.5 FX/9.0WI SOPN Inject 0.5 mLs (1.5 mg total)  into the skin once a week. 6 mL 3  . ELDERBERRY PO Take 2 tablets by mouth every other day.     . ferrous sulfate 300 (60 Fe) MG/5ML syrup Take 300 mg by mouth daily with breakfast.    . gabapentin (NEURONTIN) 300 MG capsule Take 300 mg by mouth at bedtime.     Marland Kitchen glipiZIDE (GLUCOTROL XL) 5 MG 24 hr tablet Take 1 tablet (5 mg total) by mouth daily with breakfast. 90 tablet 3  . glucose blood (ACCU-CHEK GUIDE) test strip TEST 4 TIMES DAILY BEFORE MEALS AND AT BEDTIME Dx: E11.59 150 each 5  . isosorbide mononitrate (IMDUR) 30 MG 24 hr tablet Take 1 tablet (30 mg total) by mouth daily. 90 tablet 3  . LEVEMIR FLEXTOUCH 100 UNIT/ML FlexPen INJECT 50 UNITS UNDER THE SKIN EVERY NIGHT AT BEDTIME 30 mL 0  . Multiple Vitamin (MULTIVITAMIN WITH MINERALS) TABS tablet Take 1  tablet by mouth daily.    . nitroGLYCERIN (NITROSTAT) 0.4 MG SL tablet DISSOLVE 1 TAB UNDER TONGUE FOR CHEST PAIN - IF PAIN REMAINS AFTER 5 MIN, CALL 911 AND REPEAT DOSE. MAX 3 TABS IN 15 MINUTES (Patient taking differently: Place 0.4 mg under the tongue every 5 (five) minutes as needed for chest pain. ) 25 tablet 0  . pantoprazole (PROTONIX) 40 MG tablet TAKE ONE TABLET BY MOUTH TWICE A DAY 180 tablet 1  . sacubitril-valsartan (ENTRESTO) 97-103 MG Take 1 tablet by mouth 2 (two) times daily. 180 tablet 3  . Saline 0.2 % SOLN Place 1 spray into both nostrils as needed (congestion).     Marland Kitchen spironolactone (ALDACTONE) 25 MG tablet Take 1 tablet (25 mg total) by mouth every evening. 90 tablet 3  . ticagrelor (BRILINTA) 60 MG TABS tablet Take 1 tablet (60 mg total) by mouth 2 (two) times daily. 180 tablet 5  . tiZANidine (ZANAFLEX) 4 MG tablet Take 4 mg by mouth at bedtime.     Marland Kitchen VITAMIN D PO Take 1 tablet by mouth 2 (two) times a week. Mondays and Wednesdays     No facility-administered medications prior to visit.     Allergies:   Hydralazine, Bidil [isosorb dinitrate-hydralazine], Ciprofloxacin, Farxiga [dapagliflozin], Amoxicillin,  Hydrocodone, Lisinopril, Meloxicam, Strawberry extract, Tape, and Toradol [ketorolac tromethamine]   Social History   Socioeconomic History  . Marital status: Widowed    Spouse name: Not on file  . Number of children: 2  . Years of education: Not on file  . Highest education level: Not on file  Occupational History  . Not on file  Tobacco Use  . Smoking status: Never Smoker  . Smokeless tobacco: Never Used  Vaping Use  . Vaping Use: Never used  Substance and Sexual Activity  . Alcohol use: Never  . Drug use: Never  . Sexual activity: Yes  Other Topics Concern  . Not on file  Social History Narrative  . Not on file   Social Determinants of Health   Financial Resource Strain:   . Difficulty of Paying Living Expenses: Not on file  Food Insecurity:   . Worried About Charity fundraiser in the Last Year: Not on file  . Ran Out of Food in the Last Year: Not on file  Transportation Needs:   . Lack of Transportation (Medical): Not on file  . Lack of Transportation (Non-Medical): Not on file  Physical Activity:   . Days of Exercise per Week: Not on file  . Minutes of Exercise per Session: Not on file  Stress:   . Feeling of Stress : Not on file  Social Connections:   . Frequency of Communication with Friends and Family: Not on file  . Frequency of Social Gatherings with Friends and Family: Not on file  . Attends Religious Services: Not on file  . Active Member of Clubs or Organizations: Not on file  . Attends Archivist Meetings: Not on file  . Marital Status: Not on file     Family History:  The patient's family history includes Diabetes in her mother; Heart disease in her mother; Hypertension in her mother; Stroke in her mother.   ROS:   Please see the history of present illness.    ROS All other systems reviewed and are negative.   PHYSICAL EXAM:   VS:  There were no vitals taken for this visit.   GEN: Well nourished, well developed, in no acute distress  HEENT: normal  Neck: no JVD, carotid bruits, or masses Cardiac: RRR; no murmurs, rubs, or gallops,no edema  Respiratory:  clear to auscultation bilaterally, normal work of breathing GI: soft, nontender, nondistended, + BS MS: no deformity or atrophy  Skin: warm and dry, no rash Neuro:  Alert and Oriented x 3, Strength and sensation are intact Psych: euthymic mood, full affect  Wt Readings from Last 3 Encounters:  11/05/19 243 lb (110.2 kg)  10/26/19 246 lb 12.8 oz (111.9 kg)  06/22/19 245 lb 3.2 oz (111.2 kg)      Studies/Labs Reviewed:   EKG:  EKG is not ordered today.    Recent Labs: 01/14/2019: B Natriuretic Peptide 151.3; TSH 2.377 06/05/2019: ALT 14; Hemoglobin 9.8; Platelets 183 10/26/2019: BUN 31; Creatinine, Ser 1.86; Magnesium 1.9; Potassium 4.0; Sodium 139   Lipid Panel    Component Value Date/Time   CHOL 123 10/26/2019 1238   TRIG 69 10/26/2019 1238   HDL 33 (L) 10/26/2019 1238   CHOLHDL 3.7 10/26/2019 1238   VLDL 14 10/26/2019 1238   LDLCALC 76 10/26/2019 1238   Labs dated 04/30/19: Hgb 9.6. BUN 24, creatinine 1.62. otherwise CMET normal. Glucose 80  Additional studies/ records that were reviewed today include:   Cath 10/12/2017  Ost LM lesion is 40% stenosed.  Prox Cx to Dist Cx lesion is 75% stenosed.  Ost 1st Mrg lesion is 60% stenosed.  Previously placed Prox LAD to Mid LAD stent (unknown type) is widely patent.  Mid LAD lesion is 100% stenosed.  Ost 2nd Diag to 2nd Diag lesion is 70% stenosed.  Prox RCA lesion is 30% stenosed.  Mid RCA lesion is 40% stenosed.  Post Atrio lesion is 95% stenosed.   1.  Continued patency of the stented segment in the proximal LAD and stented segment in the proximal RCA 2.  Progressive small vessel CAD involving the distal left circumflex and right posterolateral branch 3.  Chronic occlusion of the mid LAD beyond the second diagonal branch unchanged in appearance from the previous study 4.  Normal  LVEDP  Recommendation: Ongoing aggressive medical therapy.  The patient's probable culprit lesion is a severe stenosis in the distal posterior lateral branch involving a small territory and small caliber vessel that I think would be best treated with medical therapy rather than intervention.  Recommend dual antiplatelet therapy with Aspirin 50m daily and Ticagrelor 919mtwice daily long-term (beyond 12 months) because of diffuse CAD/diabetes/multivessel stenting.  Echo 09/01/18: IMPRESSIONS    1. The left ventricle has moderate-severely reduced systolic function, with an ejection fraction of 30-35%. The cavity size was mildly dilated. Left ventricular diastolic Doppler parameters are consistent with pseudonormalization.  2. The right ventricle has normal systolic function. The cavity was normal.  3. The mitral valve is grossly normal.  4. The aortic valve is tricuspid. Mild thickening of the aortic valve. No stenosis of the aortic valve.  5. Technically difficult; definity used; akinesis of the apex with overall moderate to severe LV dysfunction; moderate diastolic dysfunction.  ASSESSMENT:    No diagnosis found.   PLAN:  In order of problems listed above:  1. Chronic combined systolic and diastolic heart failure: EF 30-35%.  On maximal Coreg due to baseline bradycardia and presence of Mobitz 1 heart block during the hospitalization.  On Entresto and aldactone. Unable to tolerate Bidil due to lightheadedness but is tolerating Imdur OK. Appears well compensated today.  2. CAD: s/p DES of LAD in 2018 in setting of anterior MI.  S/p DES of RCA in January 2019. I reviewed her last cardiac cath in July 2019. She has extensive small distal branch vessel disease in distal LAD (CTO), small diagonal, distal LCX, and 95% PL lesion. these are not amenable to PCI due to small vessel size. High risk Myoview in July 2019 with cardiac cath as noted. Medical management.  Class 1-2  angina.  3. Hypertension: Blood pressure is well controlled today.   4. Hyperlipidemia: On Lipitor 80 mg daily.  Cholesterol was well controlled on recent lab work. LDL of 50.   5. DM2: On insulin, managed by primary care provider. Currently on glipizide and insulin. Given CHF I would favor switching glipizide to an SGLT2 inhibitor.     Medication Adjustments/Labs and Tests Ordered: Current medicines are reviewed at length with the patient today.  Concerns regarding medicines are outlined above.  Medication changes, Labs and Tests ordered today are listed in the Patient Instructions below. There are no Patient Instructions on file for this visit.   Signed, Tiziana Cislo [name], MD  11/13/2019 4:21 PM    [facility] [address], [place], [place]  [address] Phone: [phone]; Fax: [phone]

## 2019-11-15 ENCOUNTER — Ambulatory Visit: Payer: Self-pay | Admitting: Cardiology

## 2019-11-26 NOTE — Progress Notes (Signed)
Cardiology Office Note    Date:  11/28/2019   ID:  Stephanie Manning 1961-03-06, MRN [id number]  PCP:  [name], MD  Cardiologist: Dr. [name] Heart failure clinic: Dr. [name]  No chief complaint on file.   History of Present Illness:  Stephanie Manning is a 59 y.o. female with PMH of CAD, DM II, CKD stage IV, HTN, HLD, CAD and ICM.  In December 2018 she had anterior MI with DES to totally occluded mid LAD.  She returned in January 2019 for staged DES to 80% RCA disease.  Echocardiogram in December 2018 showed ejection fraction of 30 to 35%.  Repeat echocardiogram in March 2019 continue to show ejection fraction 30 to 35%.  Despite intervention, she still has intermittent chest discomfort.  She was referred to Dr. [name] for management of heart failure.  She was last seen in the heart failure clinic on 09/27/2017, she was on carvedilol, Entresto was increased to 49-51 mg twice daily, Lasix reduced to 20 mg every other day and she is also on spironolactone 12.5 mg daily.  Given her persistent low ejection fraction, she was referred to electrophysiology service for consideration of ICD.  She was admitted to the hospital on 10/10/2017 with recurrent chest pain.  She underwent Myoview which came back high risk.  She eventually underwent cardiac catheterization on 10/12/2017 which showed 75% prox LCx, 60% OM1, 40% LM, 100% mid LAD chronic occlusion, 95% post atrio lesion.  The culprit lesion was felt to be post atrial lesion, however best management was medical therapy given the small caliber of the artery.  She was recommended to continue aspirin and Brilinta for long-term, beyond 44-month  I increased her Imdur to 60 mg daily.  During this admission, carvedilol was reduced to 6.25 mg twice daily due to bradycardia, Mobitz type I and up to 2 dropped beats and dizziness.  Her Entresto was increased  twice daily for high blood pressure.  She has been followed closely by Dr MAundra Dubinin the  CHF clinic. She is on Coreg, Entresto, and aldactone. She had follow up with Echo in June 2020 and EF was unchanged. She has been seen by Dr ARayann Hemanfor consideration of ICD but has deferred. She is intolerant of Bidil due to lightheadedness/syncope. Also intolerant of FIran   On follow up today she is doing OK. She did start on Trulicity on Sunday. Monday noted HR went up. Mild GI distress. Feeling better now. Had a recent sinus infection. Denies  increased dyspnea. . No increased edema. Careful with her salt intake.Weight has been stable.  Getting ready to move to a new house.    Past Medical History:  Diagnosis Date  . AKI (acute kidney injury) ([facility] 02/2017  . CAD in native artery    a. NSTEMI 02/2017 s/p DES to mLAD, diffuse disease otherwise, EF 30-35%. b. Staged cath 03/2017 - patent stent in the proximal to mid LAD. CTO of the distal LAD, 90% diffuse small first diagonal, 90% distal LCx/OM, 80% diffuse prox RCA, moderately elevated LVEDP 263mg, s/p DES to mRCA, residual disease treated medically (too small for PCI), EF 30-35%.  . Chronic combined systolic and diastolic CHF (congestive heart failure) ([facility]  . CKD (chronic kidney disease), stage IV ([facility]   /nArchie Endo/09/2017  . Diabetes mellitus, type II, insulin dependent ([facility]  . GERD (gastroesophageal reflux disease)   . History of blood transfusion    "related to menses"  . History  of stomach ulcers 2017  . Hyperlipidemia   . Hypertension   . Iron deficiency anemia 03/2017   "had to have iron infusions" (10/10/2017)  . Ischemic cardiomyopathy   . STEMI (ST elevation myocardial infarction) ([facility]) 02/2017    Past Surgical History:  Procedure Laterality Date  . CESAREAN SECTION  1987  . CORONARY ANGIOPLASTY WITH STENT PLACEMENT  04/01/2017   drug-eluting stent was successfully placed using a STENT SYNERGY DES [id number]  . CORONARY STENT INTERVENTION N/A 04/01/2017   Procedure: CORONARY STENT INTERVENTION;  Surgeon: [name], Karn Derk  M, MD;  Location: [facility];  Service: Cardiovascular;  Laterality: N/A;  . CORONARY/GRAFT ACUTE MI REVASCULARIZATION N/A 03/10/2017   Procedure: Coronary/Graft Acute MI Revascularization;  Surgeon: [name], Susi Goslin M, MD;  Location: [facility];  Service: Cardiovascular;  Laterality: N/A;  . LEFT HEART CATH AND CORONARY ANGIOGRAPHY N/A 03/10/2017   Procedure: LEFT HEART CATH AND CORONARY ANGIOGRAPHY;  Surgeon: [name], Gabryelle Whitmoyer M, MD;  Location: [facility];  Service: Cardiovascular;  Laterality: N/A;  . LEFT HEART CATH AND CORONARY ANGIOGRAPHY N/A 04/01/2017   Procedure: LEFT HEART CATH AND CORONARY ANGIOGRAPHY;  Surgeon: [name], Mozelle Remlinger M, MD;  Location: [facility];  Service: Cardiovascular;  Laterality: N/A;  . LEFT HEART CATH AND CORONARY ANGIOGRAPHY N/A 10/12/2017   Procedure: LEFT HEART CATH AND CORONARY ANGIOGRAPHY;  Surgeon: Sherren Mocha, MD;  Location: [facility];  Service: Cardiovascular;  Laterality: N/A;  . OOPHORECTOMY     "1 ovary removed"  . THROAT SURGERY  02/2014   "nodule in my throat removed; not related to thyroid"    Current Medications: Outpatient Medications Prior to Visit  Medication Sig Dispense Refill  . Accu-Chek Softclix Lancets lancets Use as instructed 100 each 2  . aspirin 81 MG chewable tablet Chew 1 tablet (81 mg total) by mouth daily.    Marland Kitchen atorvastatin (LIPITOR) 80 MG tablet Take 1 tablet (80 mg total) by mouth daily at 6 PM. NEEDS APPOINTMENT WITH DR [name] FOR FUTURE REFILLS 90 tablet 3  . blood glucose meter kit and supplies Dispense based on patient and insurance preference. Use up to four times daily as directed. (FOR ICD-10 E11.65) Pt prefers One touch mini 1 each 5  . Dulaglutide (TRULICITY) 1.5 AL/9.3XT SOPN Inject 0.5 mLs (1.5 mg total) into the skin once a week. 6 mL 3  . ELDERBERRY PO Take 2 tablets by mouth every other day.     . ferrous sulfate 300 (60 Fe) MG/5ML syrup Take 300 mg by mouth daily with breakfast.     . gabapentin (NEURONTIN) 300 MG capsule Take 300 mg by mouth at bedtime.     Marland Kitchen glipiZIDE (GLUCOTROL XL) 5 MG 24 hr tablet Take 1 tablet (5 mg total) by mouth daily with breakfast. 90 tablet 3  . glucose blood (ACCU-CHEK GUIDE) test strip TEST 4 TIMES DAILY BEFORE MEALS AND AT BEDTIME Dx: E11.59 150 each 5  . LEVEMIR FLEXTOUCH 100 UNIT/ML FlexPen INJECT 50 UNITS UNDER THE SKIN EVERY NIGHT AT BEDTIME 30 mL 0  . Multiple Vitamin (MULTIVITAMIN WITH MINERALS) TABS tablet Take 1 tablet by mouth daily.    . Saline 0.2 % SOLN Place 1 spray into both nostrils as needed (congestion).     Marland Kitchen spironolactone (ALDACTONE) 25 MG tablet Take 1 tablet (25 mg total) by mouth every evening. 90 tablet 3  . ticagrelor (BRILINTA) 60 MG TABS tablet Take 1 tablet (60 mg total) by mouth  2 (two) times daily. 180 tablet 5  . tiZANidine (ZANAFLEX) 4 MG tablet Take 4 mg by mouth at bedtime.     Marland Kitchen VITAMIN D PO Take 1 tablet by mouth 2 (two) times a week. Mondays and Wednesdays    . carvedilol (COREG) 12.5 MG tablet Take 1 tablet (12.5 mg total) by mouth 2 (two) times daily with a meal. 180 tablet 3  . isosorbide mononitrate (IMDUR) 30 MG 24 hr tablet Take 1 tablet (30 mg total) by mouth daily. 90 tablet 3  . nitroGLYCERIN (NITROSTAT) 0.4 MG SL tablet DISSOLVE 1 TAB UNDER TONGUE FOR CHEST PAIN - IF PAIN REMAINS AFTER 5 MIN, CALL 911 AND REPEAT DOSE. MAX 3 TABS IN 15 MINUTES (Patient taking differently: Place 0.4 mg under the tongue every 5 (five) minutes as needed for chest pain. ) 25 tablet 0  . pantoprazole (PROTONIX) 40 MG tablet TAKE ONE TABLET BY MOUTH TWICE A DAY 180 tablet 1  . sacubitril-valsartan (ENTRESTO) 97-103 MG Take 1 tablet by mouth 2 (two) times daily. 180 tablet 3   No facility-administered medications prior to visit.     Allergies:   Hydralazine, Bidil [isosorb dinitrate-hydralazine], Ciprofloxacin, Farxiga [dapagliflozin], Amoxicillin, Hydrocodone, Lisinopril, Meloxicam, Strawberry extract, Tape, and Toradol  [ketorolac tromethamine]   Social History   Socioeconomic History  . Marital status: Widowed    Spouse name: Not on file  . Number of children: 2  . Years of education: Not on file  . Highest education level: Not on file  Occupational History  . Not on file  Tobacco Use  . Smoking status: Never Smoker  . Smokeless tobacco: Never Used  Vaping Use  . Vaping Use: Never used  Substance and Sexual Activity  . Alcohol use: Never  . Drug use: Never  . Sexual activity: Yes  Other Topics Concern  . Not on file  Social History Narrative  . Not on file   Social Determinants of Health   Financial Resource Strain:   . Difficulty of Paying Living Expenses: Not on file  Food Insecurity:   . Worried About Charity fundraiser in the Last Year: Not on file  . Ran Out of Food in the Last Year: Not on file  Transportation Needs:   . Lack of Transportation (Medical): Not on file  . Lack of Transportation (Non-Medical): Not on file  Physical Activity:   . Days of Exercise per Week: Not on file  . Minutes of Exercise per Session: Not on file  Stress:   . Feeling of Stress : Not on file  Social Connections:   . Frequency of Communication with Friends and Family: Not on file  . Frequency of Social Gatherings with Friends and Family: Not on file  . Attends Religious Services: Not on file  . Active Member of Clubs or Organizations: Not on file  . Attends Archivist Meetings: Not on file  . Marital Status: Not on file     Family History:  The patient's family history includes Diabetes in her mother; Heart disease in her mother; Hypertension in her mother; Stroke in her mother.   ROS:   Please see the history of present illness.    ROS All other systems reviewed and are negative.   PHYSICAL EXAM:   VS:  BP (!) 150/78   Pulse 77   Temp (!) 96.3 F (35.7 C)   Ht '5\' 8"'  (1.727 m)   Wt 245 lb 3.2 oz (111.2 kg)  SpO2 95%   BMI 37.28 kg/m    GEN: Well nourished, well  developed, in no acute distress  HEENT: normal  Neck: no JVD, carotid bruits, or masses Cardiac: RRR; no murmurs, rubs, or gallops,no edema  Respiratory:  clear to auscultation bilaterally, normal work of breathing GI: soft, nontender, nondistended, + BS MS: no deformity or atrophy  Skin: warm and dry, no rash Neuro:  Alert and Oriented x 3, Strength and sensation are intact Psych: euthymic mood, full affect  Wt Readings from Last 3 Encounters:  11/28/19 245 lb 3.2 oz (111.2 kg)  11/05/19 243 lb (110.2 kg)  10/26/19 246 lb 12.8 oz (111.9 kg)      Studies/Labs Reviewed:   EKG:  EKG is not ordered today.    Recent Labs: 01/14/2019: B Natriuretic Peptide 151.3; TSH 2.377 06/05/2019: ALT 14; Hemoglobin 9.8; Platelets 183 10/26/2019: BUN 31; Creatinine, Ser 1.86; Magnesium 1.9; Potassium 4.0; Sodium 139   Lipid Panel    Component Value Date/Time   CHOL 123 10/26/2019 1238   TRIG 69 10/26/2019 1238   HDL 33 (L) 10/26/2019 1238   CHOLHDL 3.7 10/26/2019 1238   VLDL 14 10/26/2019 1238   LDLCALC 76 10/26/2019 1238   Labs dated 04/30/19: Hgb 9.6. BUN 24, creatinine 1.62. otherwise CMET normal. Glucose 80  Additional studies/ records that were reviewed today include:   Cath 10/12/2017  Ost LM lesion is 40% stenosed.  Prox Cx to Dist Cx lesion is 75% stenosed.  Ost 1st Mrg lesion is 60% stenosed.  Previously placed Prox LAD to Mid LAD stent (unknown type) is widely patent.  Mid LAD lesion is 100% stenosed.  Ost 2nd Diag to 2nd Diag lesion is 70% stenosed.  Prox RCA lesion is 30% stenosed.  Mid RCA lesion is 40% stenosed.  Post Atrio lesion is 95% stenosed.   1.  Continued patency of the stented segment in the proximal LAD and stented segment in the proximal RCA 2.  Progressive small vessel CAD involving the distal left circumflex and right posterolateral branch 3.  Chronic occlusion of the mid LAD beyond the second diagonal branch unchanged in appearance from the previous  study 4.  Normal LVEDP  Recommendation: Ongoing aggressive medical therapy.  The patient's probable culprit lesion is a severe stenosis in the distal posterior lateral branch involving a small territory and small caliber vessel that I think would be best treated with medical therapy rather than intervention.  Recommend dual antiplatelet therapy with Aspirin 60m daily and Ticagrelor 933mtwice daily long-term (beyond 12 months) because of diffuse CAD/diabetes/multivessel stenting.  Echo 09/01/18: IMPRESSIONS    1. The left ventricle has moderate-severely reduced systolic function, with an ejection fraction of 30-35%. The cavity size was mildly dilated. Left ventricular diastolic Doppler parameters are consistent with pseudonormalization.  2. The right ventricle has normal systolic function. The cavity was normal.  3. The mitral valve is grossly normal.  4. The aortic valve is tricuspid. Mild thickening of the aortic valve. No stenosis of the aortic valve.  5. Technically difficult; definity used; akinesis of the apex with overall moderate to severe LV dysfunction; moderate diastolic dysfunction.  ASSESSMENT:    1. Chronic combined systolic and diastolic heart failure ([facility]  2. Coronary artery disease of native artery of native heart with stable angina pectoris ([facility]  3. Ischemic cardiomyopathy   4. DM type 2 causing vascular disease ([facility]  5. Mixed hyperlipidemia      PLAN:  In order of  problems listed above:  1. Chronic combined systolic and diastolic heart failure: EF 30-35%.  On maximal Coreg due to history of bradycardia and presence of Mobitz 1 heart block during prior  hospitalization.  On Entresto maximal dose and aldactone. Unable to tolerate Bidil due to lightheadedness but is tolerating Imdur OK. Appears well compensated today. Intolerant of [place].   2. CAD: s/p DES of LAD in 2018 in setting of anterior MI. S/p DES of RCA in January 2019. I reviewed her last cardiac  cath in July 2019. She has extensive small distal branch vessel disease in distal LAD (CTO), small diagonal, distal LCX, and 95% PL lesion. these are not amenable to PCI due to small vessel size. High risk Myoview in July 2019 with cardiac cath as noted. Medical management.  Class 1-2 angina. On DAPT, statin, Coreg and Imdur.   3. Hypertension: Blood pressure has been well controlled.  4. Hyperlipidemia: On Lipitor 80 mg daily.  LDL 73.   5. DM2: managed by endocrinology. Currently on glipizide and insulin. Just added Trulicity.   Follow up in 6 months.  Medication Adjustments/Labs and Tests Ordered: Current medicines are reviewed at length with the patient today.  Concerns regarding medicines are outlined above.  Medication changes, Labs and Tests ordered today are listed in the Patient Instructions below. There are no Patient Instructions on file for this visit.   Signed, Tyreak Reagle [name], MD  11/28/2019 4:16 PM    [facility] [address], [place], [place]  [address] Phone: [phone]; Fax: [phone]

## 2019-11-28 ENCOUNTER — Encounter: Payer: Self-pay | Admitting: Cardiology

## 2019-11-28 ENCOUNTER — Ambulatory Visit (INDEPENDENT_AMBULATORY_CARE_PROVIDER_SITE_OTHER): Payer: Medicare Other | Admitting: Cardiology

## 2019-11-28 ENCOUNTER — Other Ambulatory Visit: Payer: Self-pay

## 2019-11-28 VITALS — BP 150/78 | HR 77 | Temp 96.3°F | Ht 68.0 in | Wt 245.2 lb

## 2019-11-28 DIAGNOSIS — I255 Ischemic cardiomyopathy: Secondary | ICD-10-CM | POA: Diagnosis not present

## 2019-11-28 DIAGNOSIS — E1159 Type 2 diabetes mellitus with other circulatory complications: Secondary | ICD-10-CM

## 2019-11-28 DIAGNOSIS — E782 Mixed hyperlipidemia: Secondary | ICD-10-CM

## 2019-11-28 DIAGNOSIS — I5042 Chronic combined systolic (congestive) and diastolic (congestive) heart failure: Secondary | ICD-10-CM

## 2019-11-28 DIAGNOSIS — I25118 Atherosclerotic heart disease of native coronary artery with other forms of angina pectoris: Secondary | ICD-10-CM | POA: Diagnosis not present

## 2019-11-28 MED ORDER — NITROGLYCERIN 0.4 MG SL SUBL: 0.4000 mg | SUBLINGUAL_TABLET | SUBLINGUAL | 3 refills | Status: DC | PRN

## 2019-11-28 MED ORDER — SACUBITRIL-VALSARTAN 97-103 MG PO TABS
1.0000 | ORAL_TABLET | Freq: Two times a day (BID) | ORAL | 3 refills | Status: DC
Start: 2019-11-28 — End: 2020-08-16

## 2019-11-28 MED ORDER — ISOSORBIDE MONONITRATE ER 30 MG PO TB24
30.0000 mg | ORAL_TABLET | Freq: Every day | ORAL | 3 refills | Status: DC
Start: 2019-11-28 — End: 2020-07-10

## 2019-11-28 MED ORDER — PANTOPRAZOLE SODIUM 40 MG PO TBEC: 40.0000 mg | DELAYED_RELEASE_TABLET | Freq: Two times a day (BID) | ORAL | 3 refills | Status: AC

## 2019-11-28 MED ORDER — CARVEDILOL 12.5 MG PO TABS
12.5000 mg | ORAL_TABLET | Freq: Two times a day (BID) | ORAL | 3 refills | Status: DC
Start: 2019-11-28 — End: 2020-11-12

## 2019-11-29 ENCOUNTER — Telehealth: Payer: Self-pay

## 2019-11-29 NOTE — Telephone Encounter (Signed)
Those numbers are reassuring!  Looks like the Trulicity is doing some good!  Let's start out slow... I want her to start the Levemir at 35 units nightly (this is a long acting insulin which will not drop her BS fast) along with the Trulicity.  Continue to hold off on the Glipizide for now.  My goal for her blood sugars are: fasting (am) between 90-110; daytime between 160-180; and bedtime between 120-140.  Because her blood glucose was running significantly higher, she may get symptoms of low blood sugar when her sugar is actually normal.  This usually only lasts for a short period until her body gets used to her "new normal."  Reassure her we are heading in the right direction!

## 2019-11-29 NOTE — Telephone Encounter (Signed)
Patient is asking if she is suppose to continue taking glipizide with her insulin. Please advise.

## 2019-11-29 NOTE — Telephone Encounter (Signed)
Per patient her readings are as follows.Marland KitchenMarland KitchenMarland Kitchen9/16 am fasting 124, 9/15 @820pm  164,1048am 122,617am 146,9/14 847pm 120, 628am 175, 1217am 139, 9/13 836pm 107, 310pm 139, 1149am 149(after meal). She said she can come in for a visit, she is getting labs done mid October at her PCP. She doesn't want to take the levemir at the dosage that was prescribed as she doesn't want to pass out.

## 2019-11-29 NOTE — Telephone Encounter (Signed)
I returned call to patient, she stated that she has not been taking glipizide or the evening insulin because she thought she was to stop it since she started Trulicity, she took trulicity last on Sunday and her blood sugars are running below 150 all week. She did not have her readings available to give me exact numbers as she was not home, stated that this morning it was 129 or 130. Stated that she will be dropping out if she takes all of the meds.

## 2019-11-29 NOTE — Telephone Encounter (Signed)
Stephanie Manning, I need her BS readings to be able to make an informed decision.  The Trulicity was to to be in addition to her other diabetes medications.  She needs to at least stay on the Levemir in addition to the trulicity.  She can stop taking the glipizide for now if she is concerned about low blood sugars.  She is encouraged to call the office if blood sugar readings are below 70 so I can decrease her dose of Levemir.

## 2019-11-29 NOTE — Telephone Encounter (Signed)
Patient notified and verbalized understanding to instructions.

## 2020-01-06 ENCOUNTER — Other Ambulatory Visit: Payer: Self-pay | Admitting: "Endocrinology

## 2020-01-06 DIAGNOSIS — E1159 Type 2 diabetes mellitus with other circulatory complications: Secondary | ICD-10-CM

## 2020-01-16 DIAGNOSIS — K219 Gastro-esophageal reflux disease without esophagitis: Secondary | ICD-10-CM | POA: Insufficient documentation

## 2020-01-19 ENCOUNTER — Other Ambulatory Visit: Payer: Self-pay | Admitting: Cardiology

## 2020-01-19 DIAGNOSIS — I5042 Chronic combined systolic (congestive) and diastolic (congestive) heart failure: Secondary | ICD-10-CM

## 2020-01-28 ENCOUNTER — Ambulatory Visit (HOSPITAL_COMMUNITY)
Admission: RE | Admit: 2020-01-28 | Discharge: 2020-01-28 | Disposition: A | Discharge status: Home / Self Care | Payer: Medicare Other | Source: Ambulatory Visit | Attending: Cardiology | Admitting: Cardiology

## 2020-01-28 ENCOUNTER — Other Ambulatory Visit: Payer: Self-pay

## 2020-01-28 ENCOUNTER — Ambulatory Visit (HOSPITAL_BASED_OUTPATIENT_CLINIC_OR_DEPARTMENT_OTHER)
Admission: RE | Admit: 2020-01-28 | Discharge: 2020-01-28 | Disposition: A | Discharge status: Home / Self Care | Payer: Medicare Other | Source: Ambulatory Visit | Attending: Cardiology | Admitting: Cardiology

## 2020-01-28 VITALS — BP 130/80 | HR 83 | Wt 241.4 lb

## 2020-01-28 DIAGNOSIS — I2510 Atherosclerotic heart disease of native coronary artery without angina pectoris: Secondary | ICD-10-CM | POA: Diagnosis not present

## 2020-01-28 DIAGNOSIS — I252 Old myocardial infarction: Secondary | ICD-10-CM | POA: Diagnosis not present

## 2020-01-28 DIAGNOSIS — I130 Hypertensive heart and chronic kidney disease with heart failure and stage 1 through stage 4 chronic kidney disease, or unspecified chronic kidney disease: Secondary | ICD-10-CM | POA: Insufficient documentation

## 2020-01-28 DIAGNOSIS — N183 Chronic kidney disease, stage 3 unspecified: Secondary | ICD-10-CM | POA: Insufficient documentation

## 2020-01-28 DIAGNOSIS — I25118 Atherosclerotic heart disease of native coronary artery with other forms of angina pectoris: Secondary | ICD-10-CM

## 2020-01-28 DIAGNOSIS — Z955 Presence of coronary angioplasty implant and graft: Secondary | ICD-10-CM | POA: Insufficient documentation

## 2020-01-28 DIAGNOSIS — E1122 Type 2 diabetes mellitus with diabetic chronic kidney disease: Secondary | ICD-10-CM | POA: Insufficient documentation

## 2020-01-28 DIAGNOSIS — Z79899 Other long term (current) drug therapy: Secondary | ICD-10-CM | POA: Diagnosis not present

## 2020-01-28 DIAGNOSIS — I255 Ischemic cardiomyopathy: Secondary | ICD-10-CM

## 2020-01-28 DIAGNOSIS — Z7902 Long term (current) use of antithrombotics/antiplatelets: Secondary | ICD-10-CM | POA: Diagnosis not present

## 2020-01-28 DIAGNOSIS — I5042 Chronic combined systolic (congestive) and diastolic (congestive) heart failure: Secondary | ICD-10-CM

## 2020-01-28 DIAGNOSIS — D509 Iron deficiency anemia, unspecified: Secondary | ICD-10-CM | POA: Diagnosis not present

## 2020-01-28 DIAGNOSIS — Z8249 Family history of ischemic heart disease and other diseases of the circulatory system: Secondary | ICD-10-CM | POA: Diagnosis not present

## 2020-01-28 DIAGNOSIS — Z7982 Long term (current) use of aspirin: Secondary | ICD-10-CM | POA: Insufficient documentation

## 2020-01-28 DIAGNOSIS — K219 Gastro-esophageal reflux disease without esophagitis: Secondary | ICD-10-CM | POA: Diagnosis not present

## 2020-01-28 DIAGNOSIS — Z794 Long term (current) use of insulin: Secondary | ICD-10-CM | POA: Diagnosis not present

## 2020-01-28 DIAGNOSIS — I5022 Chronic systolic (congestive) heart failure: Secondary | ICD-10-CM | POA: Insufficient documentation

## 2020-01-28 DIAGNOSIS — E785 Hyperlipidemia, unspecified: Secondary | ICD-10-CM | POA: Insufficient documentation

## 2020-01-28 LAB — BASIC METABOLIC PANEL
Anion gap: 10 (ref 5–15)
BUN: 15 mg/dL (ref 6–20)
CO2: 24 mmol/L (ref 22–32)
Calcium: 8.8 mg/dL — ABNORMAL LOW (ref 8.9–10.3)
Chloride: 107 mmol/L (ref 98–111)
Creatinine, Ser: 1.5 mg/dL — ABNORMAL HIGH (ref 0.44–1.00)
GFR, Estimated: 40 mL/min — ABNORMAL LOW (ref >60)
Glucose, Bld: 91 mg/dL (ref 70–99)
Potassium: 3.9 mmol/L (ref 3.5–5.1)
Sodium: 141 mmol/L (ref 135–145)

## 2020-01-28 LAB — ECHOCARDIOGRAM COMPLETE
Area-P 1/2: 4.49 cm2
S' Lateral: 3.2 cm

## 2020-01-28 MED ORDER — PERFLUTREN LIPID MICROSPHERE
1.0000 mL | INTRAVENOUS | Status: DC | PRN
  Filled 2020-01-28: qty 10

## 2020-01-28 MED ORDER — SPIRONOLACTONE 25 MG PO TABS: 12.5000 mg | ORAL_TABLET | Freq: Every day | ORAL | 3 refills | Status: DC

## 2020-01-28 MED ORDER — PERFLUTREN LIPID MICROSPHERE
1.0000 mL | INTRAVENOUS | Status: DC | PRN
  Administered 2020-01-28: 2 mL via INTRAVENOUS
  Filled 2020-01-28: qty 10

## 2020-01-28 NOTE — Progress Notes (Signed)
  Echocardiogram 2D Echocardiogram has been performed.  Matilde Bash 01/28/2020, 1:56 PM

## 2020-01-28 NOTE — Patient Instructions (Addendum)
RESTART Spironolactone 12.5mg  (1/2 tablet) Daily  Labs done today, your results will be available in MyChart, we will contact you for abnormal readings.  Your physician recommends that you have repeat labs drawn in 10-14 days. A prescription has been sent with you to have them drawn locally  Your physician recommends that you schedule a follow-up appointment in: 4 months   If you have any questions or concerns before your next appointment please send [name] a message through [place] or call our office at [phone].    TO LEAVE A MESSAGE FOR THE NURSE SELECT OPTION 2, PLEASE LEAVE A MESSAGE INCLUDING: . YOUR NAME . DATE OF BIRTH . CALL BACK NUMBER . REASON FOR CALL**this is important as we prioritize the call backs  YOU WILL RECEIVE A CALL BACK THE SAME DAY AS LONG AS YOU CALL BEFORE 4:00 PM

## 2020-01-29 DIAGNOSIS — Z2821 Immunization not carried out because of patient refusal: Secondary | ICD-10-CM | POA: Insufficient documentation

## 2020-01-29 DIAGNOSIS — Z23 Encounter for immunization: Secondary | ICD-10-CM | POA: Insufficient documentation

## 2020-01-29 NOTE — Progress Notes (Signed)
Date:  01/29/2020   ID:  [name], DOB Jul 10, 1960, MRN [id number]  Provider location: [facility] Type of Visit: Established patient  PCP:  [name], MD  HF Cardiology: Dr. [name]   History of Present Illness: Stephanie Manning is a 59 y.o. female who has a history of CAD and ischemic cardiomyopathy.  In 12/18, she had anterior MI with DES to totally occluded mid LAD (culprit). She returned in 1/19 for staged DES to 80% RCA stenosis. Echo in 12/18 showed EF 30-35%.  Repeat echo in 3/19 showed EF stable at 30-35%.    She was admitted in 7/19 with chest pain, concern for unstable angina.  Culprit of symptoms was thought to be 95% stenosis in a small PLV vessel, medical treatment planned.   She saw Dr. Rayann Heman and decided against ICD.   Echo was done in 6/20 showing stable EF 30-35%.   She was unable to tolerate Bidil 1/2 tablet tid due to lightheadedness. She was unable to tolerate dapagliflozin.   Echo was done today and reviewed, EF 40-45% with apical septal and apical akinesis, normal RV.   She returns for followup of CHF and CAD.  Weight down 5 lbs.  No chest pain. Breathing overall improved from the past.  She can walk around the grocery store and [employer] without problems.  She is mildly short of breath walking up an incline.  No orthopnea/PND.  She has chronic shoulder pain from a prior fall off a ladder. She has been off spironolactone, apparently her PCP check BMET and found K to be up to 5.9.   ECG (personally reviewed): NSR, lateral TWIs  Labs (2/19): K 4.7, creatinine 1.48, hgb 8.4 Labs (5/19): LDL 65, K 4.7, creatinine 1.66 Labs (8/19): K 5, creatinine 1.78  Labs (9/19): K 3.8, creatinine 1.6 Labs (12/19): K 3.9, creatinine 1.59 Labs (3/20): LDL 73, HDL 41 Labs (5/20): K 4.1, creatinine 1.59, LDL 73 Labs (9/20): K 4.2, creatinine 1.6 Labs (11/20): K 4.1, creatinine 1.7, LDL 50, HDL 32 Labs (3/21): K 4.1, creatinine  1.67 Labs (8/21): LDL 76, HDL 30, K 4, creatinine 1.86  PMH: 1. Type II diabetes 2. CKD stage 3.  3. Hyperlipidemia 4. HTN 5. Fe deficiency anemia 6. GERD 7. CAD: Anterior MI in 12/18 with DES to 100% stenosed mid LAD (culprit), also with CTO of distal LAD, 80% pRCA, 85% mLCx/90% dLCx, 90% OM3.  - 1/19 staged PCI of proximal RCA.  - Admitted with unstable angina 7/19.  LHC: 75% stenosis proximal-mid LCx, 60% OM1, total occlusion of the distal LAD, patent mid LAD stent, patent RCA stents, 95% stenosis in small PLV branch (likely culprit).  8. Chronic systolic CHF: Ischemic cardiomyopathy.  - Echo (12/18): EF 30-35%.  - Echo (3/19): EF 30-35%, WMAs in LAD territory, RV mildly dilated.  - Echo (6/20): EF 30-35%, mild LV dilation, normal RV size and systolic function.  - Echo (11/21): EF 40-45% with apical septal and apical akinesis, normal RV.  Current Outpatient Medications  Medication Sig Dispense Refill  . Accu-Chek Softclix Lancets lancets Use as instructed 100 each 2  . aspirin 81 MG chewable tablet Chew 1 tablet (81 mg total) by mouth daily.    Marland Kitchen atorvastatin (LIPITOR) 80 MG tablet Take 1 tablet (80 mg total) by mouth daily at 6 PM. NEEDS APPOINTMENT WITH DR [name] FOR FUTURE REFILLS 90 tablet 3  . blood glucose meter kit and supplies Dispense based on  patient and insurance preference. Use up to four times daily as directed. (FOR ICD-10 E11.65) Pt prefers One touch mini 1 each 5  . carvedilol (COREG) 12.5 MG tablet Take 1 tablet (12.5 mg total) by mouth 2 (two) times daily with a meal. 180 tablet 3  . Dulaglutide (TRULICITY) 1.5 FO/2.7XA SOPN Inject 0.5 mLs (1.5 mg total) into the skin once a week. 6 mL 3  . ELDERBERRY PO Take 2 tablets by mouth every other day.     . ferrous sulfate 300 (60 Fe) MG/5ML syrup Take 300 mg by mouth daily with breakfast.    . gabapentin (NEURONTIN) 300 MG capsule Take 300 mg by mouth at bedtime.     Marland Kitchen glucose blood (ACCU-CHEK GUIDE) test strip TEST  BLOOD SUGAR FOUR TIMES A DAY BEFORE MEALS AND AT BEDTIME 150 strip 3  . isosorbide mononitrate (IMDUR) 30 MG 24 hr tablet Take 1 tablet (30 mg total) by mouth daily. 90 tablet 3  . LEVEMIR FLEXTOUCH 100 UNIT/ML FlexPen INJECT 50 UNITS UNDER THE SKIN EVERY NIGHT AT BEDTIME 30 mL 0  . Multiple Vitamin (MULTIVITAMIN WITH MINERALS) TABS tablet Take 1 tablet by mouth daily.    . nitroGLYCERIN (NITROSTAT) 0.4 MG SL tablet Place 1 tablet (0.4 mg total) under the tongue every 5 (five) minutes as needed for chest pain. 25 tablet 3  . pantoprazole (PROTONIX) 40 MG tablet Take 1 tablet (40 mg total) by mouth 2 (two) times daily. 180 tablet 3  . sacubitril-valsartan (ENTRESTO) 97-103 MG Take 1 tablet by mouth 2 (two) times daily. 180 tablet 3  . Saline 0.2 % SOLN Place 1 spray into both nostrils as needed (congestion).     . ticagrelor (BRILINTA) 60 MG TABS tablet Take 1 tablet (60 mg total) by mouth 2 (two) times daily. 180 tablet 5  . tiZANidine (ZANAFLEX) 4 MG tablet Take 4 mg by mouth at bedtime.     Marland Kitchen VITAMIN D PO Take 1 tablet by mouth 2 (two) times a week. Mondays and Wednesdays    . spironolactone (ALDACTONE) 25 MG tablet Take 0.5 tablets (12.5 mg total) by mouth daily. 15 tablet 3   No current facility-administered medications for this encounter.    Allergies:   Hydralazine, Bidil [isosorb dinitrate-hydralazine], Ciprofloxacin, Farxiga [dapagliflozin], Propoxycaine, Amoxicillin, Hydrocodone, Lisinopril, Meloxicam, Strawberry extract, Tape, and Toradol [ketorolac tromethamine]   Social History:  The patient  reports that she has never smoked. She has never used smokeless tobacco. She reports that she does not drink alcohol and does not use drugs.   Family History:  The patient's family history includes Diabetes in her mother; Heart disease in her mother; Hypertension in her mother; Stroke in her mother.   ROS:  Please see the history of present illness.   All other systems are personally reviewed  and negative.   Exam:   BP 130/80   Pulse 83   Wt 109.5 kg (241 lb 6.4 oz)   SpO2 98%   BMI 36.70 kg/m  General: NAD Neck: No JVD, no thyromegaly or thyroid nodule.  Lungs: Clear to auscultation bilaterally with normal respiratory effort. CV: Nondisplaced PMI.  Heart regular S1/S2, no S3/S4, no murmur.  No peripheral edema.  No carotid bruit.  Normal pedal pulses.  Abdomen: Soft, nontender, no hepatosplenomegaly, no distention.  Skin: Intact without lesions or rashes.  Neurologic: Alert and oriented x 3.  Psych: Normal affect. Extremities: No clubbing or cyanosis.  HEENT: Normal.   Recent Labs: 06/05/2019: ALT 14;  Hemoglobin 9.8; Platelets 183 10/26/2019: Magnesium 1.9 01/28/2020: BUN 15; Creatinine, Ser 1.50; Potassium 3.9; Sodium 141  Personally reviewed   Wt Readings from Last 3 Encounters:  01/28/20 109.5 kg (241 lb 6.4 oz)  11/28/19 111.2 kg (245 lb 3.2 oz)  11/05/19 110.2 kg (243 lb)    ASSESSMENT AND PLAN:  1. CAD: Anterior MI in 12/18 with DES to proximal LAD (culprit) followed by staged DES to proximal RCA.  7/19 admission with unstable angina, likely culprit was 95% stenosis in small PLV.  This was managed conservatively.  No recent chest pain.       - Continue atorvastatin, lipids acceptable in 8/21.    - Continue ASA 81 daily and ticagrelor 60 mg bid.  - Continue Imdur 30 mg daily.  2. Chronic systolic CHF: Ischemic cardiomyopathy. EF somewhat higher on today's echo, 40-45%.  NYHA class II symptoms.  She is not volume overloaded.   - Continue Coreg 12.5 mg bid.     - Continue Entresto 97/103 bid. BMET today.   - Can continue prn use of Lasix.  - Off spironolactone because of high K.  Would like to try her back on lower dose spironolactone, 12.5 mg daily.  Will check BMET today and repeat BMET in 10 days on spironolactone 12.5.  - She was unable to tolerate Bidil at lowest does due to lightheadedness.   - She was unable to tolerate dapagliflozin.   - She is now  out of ICD range. When EF was lower, she refused ICD.    3. CKD: stage 3.   - BMET today.  4. Fe deficiency anemia: Long-standing.  She has had IV Fe infusions.  5. Type II DM: Unable to tolerate dapagliflozin.   Followup in 4 months.     Signed, Loralie Champagne, MD  01/29/2020  [facility] [address] [facility] [place] [address] [phone] (office) [phone] (fax)

## 2020-01-31 ENCOUNTER — Other Ambulatory Visit: Payer: Self-pay

## 2020-01-31 ENCOUNTER — Telehealth: Payer: Self-pay | Admitting: Nurse Practitioner

## 2020-01-31 DIAGNOSIS — N1832 Chronic kidney disease, stage 3b: Secondary | ICD-10-CM

## 2020-01-31 DIAGNOSIS — Z794 Long term (current) use of insulin: Secondary | ICD-10-CM

## 2020-01-31 MED ORDER — TRULICITY 1.5 MG/0.5ML ~~LOC~~ SOAJ: 1.5000 mg | SUBCUTANEOUS | 3 refills | Status: DC

## 2020-01-31 MED ORDER — LEVEMIR FLEXTOUCH 100 UNIT/ML ~~LOC~~ SOPN: PEN_INJECTOR | SUBCUTANEOUS | 1 refills | Status: DC

## 2020-01-31 NOTE — Telephone Encounter (Signed)
Pt requesting refills on the following medication.  LEVEMIR FLEXTOUCH 100 UNIT/ML FlexPen   Dulaglutide (TRULICITY) 1.5 UK/3.8VK SOPN    [facility] [address] Phone:  [phone]  Fax:  [phone]

## 2020-01-31 NOTE — Telephone Encounter (Signed)
Sent in

## 2020-02-20 ENCOUNTER — Other Ambulatory Visit (HOSPITAL_COMMUNITY): Payer: Self-pay | Admitting: Cardiology

## 2020-03-01 LAB — COMPREHENSIVE METABOLIC PANEL
ALT: 18 IU/L (ref 0–32)
AST: 24 IU/L (ref 0–40)
Albumin/Globulin Ratio: 1.4 (ref 1.2–2.2)
Albumin: 4.2 g/dL (ref 3.8–4.9)
Alkaline Phosphatase: 82 IU/L (ref 44–121)
BUN/Creatinine Ratio: 10 — ABNORMAL LOW (ref 12–28)
BUN: 18 mg/dL (ref 8–27)
Bilirubin Total: 0.3 mg/dL (ref 0.0–1.2)
CO2: 20 mmol/L (ref 20–29)
Calcium: 8.8 mg/dL (ref 8.7–10.3)
Chloride: 105 mmol/L (ref 96–106)
Creatinine, Ser: 1.74 mg/dL — ABNORMAL HIGH (ref 0.57–1.00)
GFR calc Af Amer: 36 mL/min/{1.73_m2} — ABNORMAL LOW (ref >59)
GFR calc non Af Amer: 31 mL/min/{1.73_m2} — ABNORMAL LOW (ref >59)
Globulin, Total: 3 g/dL (ref 1.5–4.5)
Glucose: 149 mg/dL — ABNORMAL HIGH (ref 65–99)
Potassium: 4 mmol/L (ref 3.5–5.2)
Sodium: 140 mmol/L (ref 134–144)
Total Protein: 7.2 g/dL (ref 6.0–8.5)

## 2020-03-01 LAB — LIPID PANEL
Chol/HDL Ratio: 3.4 ratio (ref 0.0–4.4)
Cholesterol, Total: 84 mg/dL — ABNORMAL LOW (ref 100–199)
HDL: 25 mg/dL — ABNORMAL LOW (ref >39)
LDL Chol Calc (NIH): 41 mg/dL (ref 0–99)
Triglycerides: 86 mg/dL (ref 0–149)
VLDL Cholesterol Cal: 18 mg/dL (ref 5–40)

## 2020-03-01 LAB — MICROALBUMIN / CREATININE URINE RATIO
Creatinine, Urine: 601.1 mg/dL
Microalb/Creat Ratio: 29 mg/g creat (ref 0–29)
Microalbumin, Urine: 173 ug/mL

## 2020-03-01 LAB — TSH: TSH: 2.26 u[IU]/mL (ref 0.450–4.500)

## 2020-03-01 LAB — T4, FREE: Free T4: 1.45 ng/dL (ref 0.82–1.77)

## 2020-03-01 LAB — HEMOGLOBIN A1C
Est. average glucose Bld gHb Est-mCnc: 146 mg/dL
Hgb A1c MFr Bld: 6.7 % — ABNORMAL HIGH (ref 4.8–5.6)

## 2020-03-06 ENCOUNTER — Telehealth: Payer: Medicare Other | Admitting: Nurse Practitioner

## 2020-03-10 ENCOUNTER — Other Ambulatory Visit: Payer: Self-pay

## 2020-03-10 ENCOUNTER — Telehealth (INDEPENDENT_AMBULATORY_CARE_PROVIDER_SITE_OTHER): Payer: Medicare Other | Admitting: Nurse Practitioner

## 2020-03-10 ENCOUNTER — Encounter: Payer: Self-pay | Admitting: Nurse Practitioner

## 2020-03-10 DIAGNOSIS — Z794 Long term (current) use of insulin: Secondary | ICD-10-CM | POA: Diagnosis not present

## 2020-03-10 DIAGNOSIS — E1122 Type 2 diabetes mellitus with diabetic chronic kidney disease: Secondary | ICD-10-CM

## 2020-03-10 DIAGNOSIS — N1832 Chronic kidney disease, stage 3b: Secondary | ICD-10-CM

## 2020-03-10 MED ORDER — TRULICITY 1.5 MG/0.5ML ~~LOC~~ SOAJ: 1.5000 mg | SUBCUTANEOUS | 3 refills | Status: DC

## 2020-03-10 MED ORDER — LEVEMIR FLEXTOUCH 100 UNIT/ML ~~LOC~~ SOPN: 50.0000 [IU] | PEN_INJECTOR | Freq: Every day | SUBCUTANEOUS | 3 refills | Status: DC

## 2020-03-10 NOTE — Patient Instructions (Signed)

## 2020-03-10 NOTE — Progress Notes (Signed)
03/10/2020, 11:42 AM  Endocrinology Follow Up Visit  TELEHEALTH VISIT: The patient is being engaged in telehealth visit due to COVID-19.  This type of visit limits physical examination significantly, and thus is not preferable over face-to-face encounters.  I connected with  Stephanie Manning on 03/10/20 by a video enabled telemedicine application and verified that I am speaking with the correct person using two identifiers.   I discussed the limitations of evaluation and management by telemedicine. The patient expressed understanding and agreed to proceed.    The participants involved in this visit include: Brita Romp, NP located at [facility] and Stephanie Manning located at their personal residence listed.    Subjective:    Patient ID: Stephanie Manning, female    DOB: Mar 10, 1961.  Stephanie Manning is being seen in follow-up  for management of currently uncontrolled symptomatic diabetes requested by  [name], MD.   Past Medical History:  Diagnosis Date   AKI (acute kidney injury) ([place]) 02/2017   CAD in native artery    a. NSTEMI 02/2017 s/p DES to mLAD, diffuse disease otherwise, EF 30-35%. b. Staged cath 03/2017 - patent stent in the proximal to mid LAD. CTO of the distal LAD, 90% diffuse small first diagonal, 90% distal LCx/OM, 80% diffuse prox RCA, moderately elevated LVEDP 37mHg, s/p DES to mRCA, residual disease treated medically (too small for PCI), EF 30-35%.   Chronic combined systolic and diastolic CHF (congestive heart failure) [place]    CKD (chronic kidney disease), stage IV ([facility]    /[name]2/09/2017   Diabetes mellitus, type II, insulin dependent (HCC)    GERD (gastroesophageal reflux disease)    History of blood transfusion    "related to menses"   History of stomach ulcers 2017   Hyperlipidemia    Hypertension    Iron  deficiency anemia 03/2017   "had to have iron infusions" (10/10/2017)   Ischemic cardiomyopathy    STEMI (ST elevation myocardial infarction) ([facility] 02/2017    Past Surgical History:  Procedure Laterality Date   CESAREAN SECTION  1987   CORONARY ANGIOPLASTY WITH STENT PLACEMENT  04/01/2017   drug-eluting stent was successfully placed using a STENT SYNERGY DES [id number]   CORONARY STENT INTERVENTION N/A 04/01/2017   Procedure: CORONARY STENT INTERVENTION;  Surgeon: JMartinique [name], MD;  [facility];  Service: Cardiovascular;  Laterality: N/A;   CORONARY/GRAFT ACUTE MI REVASCULARIZATION N/A 03/10/2017   Procedure: Coronary/Graft Acute MI Revascularization;  Surgeon: JMartinique [name], MD;  [facility];  Service: Cardiovascular;  Laterality: N/A;   LEFT HEART CATH AND CORONARY ANGIOGRAPHY N/A 03/10/2017   Procedure: LEFT HEART CATH AND CORONARY ANGIOGRAPHY;  Surgeon: JMartinique [name], MD;  [place];  Service: Cardiovascular;  Laterality: N/A;   LEFT HEART CATH AND CORONARY ANGIOGRAPHY N/A 04/01/2017   Procedure: LEFT HEART CATH AND CORONARY ANGIOGRAPHY;  Surgeon: JMartinique [name], MD;  [facility];  Service: Cardiovascular;  Laterality: N/A;   LEFT HEART CATH AND CORONARY ANGIOGRAPHY N/A 10/12/2017   Procedure: LEFT HEART CATH  AND CORONARY ANGIOGRAPHY;  Surgeon: Sherren Mocha, MD;  Location: [place] [facility];  Service: Cardiovascular;  Laterality: N/A;   OOPHORECTOMY     "1 ovary removed"   THROAT SURGERY  02/2014   "nodule in my throat removed; not related to thyroid"    Social History   Socioeconomic History   Marital status: Widowed    Spouse name: Not on file   Number of children: 2   Years of education: Not on file   Highest education level: Not on file  Occupational History   Not on file  Tobacco Use   Smoking status: Never Smoker   Smokeless tobacco: Never Used  Vaping Use   Vaping Use:  Never used  Substance and Sexual Activity   Alcohol use: Never   Drug use: Never   Sexual activity: Yes  Other Topics Concern   Not on file  Social History Narrative   Not on file   Social Determinants of Health   Financial Resource Strain: Not on file  Food Insecurity: Not on file  Transportation Needs: Not on file  Physical Activity: Not on file  Stress: Not on file  Social Connections: Not on file    Family History  Problem Relation Age of Onset   Hypertension Mother    Heart disease Mother    Diabetes Mother    Stroke Mother     Outpatient Encounter Medications as of 03/10/2020  Medication Sig   Accu-Chek Softclix Lancets lancets Use as instructed   aspirin 81 MG chewable tablet Chew 1 tablet (81 mg total) by mouth daily.   atorvastatin (LIPITOR) 80 MG tablet Take 1 tablet (80 mg total) by mouth daily at 6 PM. NEEDS APPOINTMENT WITH DR [name] FOR FUTURE REFILLS   blood glucose meter kit and supplies Dispense based on patient and insurance preference. Use up to four times daily as directed. (FOR ICD-10 E11.65) Pt prefers One touch mini   carvedilol (COREG) 12.5 MG tablet Take 1 tablet (12.5 mg total) by mouth 2 (two) times daily with a meal.   ELDERBERRY PO Take 2 tablets by mouth every other day.    ferrous sulfate 300 (60 Fe) MG/5ML syrup Take 300 mg by mouth daily with breakfast.   gabapentin (NEURONTIN) 300 MG capsule Take 300 mg by mouth at bedtime.    glucose blood (ACCU-CHEK GUIDE) test strip TEST BLOOD SUGAR FOUR TIMES A DAY BEFORE MEALS AND AT BEDTIME   isosorbide mononitrate (IMDUR) 30 MG 24 hr tablet Take 1 tablet (30 mg total) by mouth daily.   Multiple Vitamin (MULTIVITAMIN WITH MINERALS) TABS tablet Take 1 tablet by mouth daily.   nitroGLYCERIN (NITROSTAT) 0.4 MG SL tablet Place 1 tablet (0.4 mg total) under the tongue every 5 (five) minutes as needed for chest pain.   pantoprazole (PROTONIX) 40 MG tablet Take 1 tablet (40 mg total) by  mouth 2 (two) times daily.   sacubitril-valsartan (ENTRESTO) 97-103 MG Take 1 tablet by mouth 2 (two) times daily.   Saline 0.2 % SOLN Place 1 spray into both nostrils as needed (congestion).    spironolactone (ALDACTONE) 25 MG tablet Take 0.5 tablets (12.5 mg total) by mouth daily.   ticagrelor (BRILINTA) 60 MG TABS tablet Take 1 tablet (60 mg total) by mouth 2 (two) times daily.   tiZANidine (ZANAFLEX) 4 MG tablet Take 4 mg by mouth at bedtime.   VITAMIN D PO Take 1 tablet by mouth 2 (two) times a week. Mondays and Wednesdays   [  DISCONTINUED] Dulaglutide (TRULICITY) 1.5 EU/2.3NT SOPN Inject 1.5 mg into the skin once a week.   [DISCONTINUED] insulin detemir (LEVEMIR FLEXTOUCH) 100 UNIT/ML FlexPen INJECT 50 UNITS UNDER THE SKIN EVERY NIGHT AT BEDTIME   Dulaglutide (TRULICITY) 1.5 IR/4.4RX SOPN Inject 1.5 mg into the skin once a week.   insulin detemir (LEVEMIR FLEXTOUCH) 100 UNIT/ML FlexPen Inject 50 Units into the skin at bedtime. INJECT 50 UNITS UNDER THE SKIN EVERY NIGHT AT BEDTIME   No facility-administered encounter medications on file as of 03/10/2020.    ALLERGIES: Allergies  Allergen Reactions   Hydralazine Other (See Comments)    Syncope due to hypotension   Bidil [Isosorb Dinitrate-Hydralazine]    Ciprofloxacin    Farxiga [Dapagliflozin]    Propoxycaine    Amoxicillin Other (See Comments)    Yeast infection during treatment   Hydrocodone Palpitations, Rash and Other (See Comments)    GI upset, also   Lisinopril Palpitations and Other (See Comments)    Chest pain, too   Meloxicam Nausea Only   Strawberry Extract Hives    Does not always happen   Tape Rash    Some tapes cause rashes   Toradol [Ketorolac Tromethamine] Palpitations    VACCINATION STATUS:  There is no immunization history on file for this patient.  Diabetes She presents for her follow-up diabetic visit. She has type 2 diabetes mellitus. Onset time: She was diagnosed at approximate  age of 41 years. Her disease course has been improving. There are no hypoglycemic associated symptoms. Pertinent negatives for hypoglycemia include no confusion, headaches, pallor or seizures. Associated symptoms include fatigue and polyuria. Pertinent negatives for diabetes include no chest pain, no polydipsia and no polyphagia. (Having symptoms of UTI- calling her PCP about this soon) There are no hypoglycemic complications. Symptoms are improving. Diabetic complications include heart disease, nephropathy, peripheral neuropathy and retinopathy. Risk factors for coronary artery disease include diabetes mellitus, dyslipidemia, hypertension, obesity, sedentary lifestyle and post-menopausal. Current diabetic treatment includes insulin injections. She is compliant with treatment most of the time. Her weight is decreasing steadily. She is following a generally unhealthy diet. When asked about meal planning, she reported none. She has not had a previous visit with a dietitian. She never participates in exercise. Her home blood glucose trend is decreasing steadily. (She presents today for her virtual visit with her meter and logs showing greatly improved glycemic profile overall.  Her previsit A1c was 6.7%, improved from last visit of 8.3%.  She called the clinic in between visits for hypoglycemia and her Glipizide was stopped.  She denies any significant hypoglycemia.  ) An ACE inhibitor/angiotensin II receptor blocker is being taken. She does not see a podiatrist.Eye exam is current.  Hyperlipidemia This is a chronic problem. The current episode started more than 1 year ago. The problem is controlled. Recent lipid tests were reviewed and are normal. Exacerbating diseases include chronic renal disease, diabetes and obesity. Factors aggravating her hyperlipidemia include fatty foods. Pertinent negatives include no chest pain, myalgias or shortness of breath. Current antihyperlipidemic treatment includes statins. The  current treatment provides mild improvement of lipids. Compliance problems include adherence to diet.  Risk factors for coronary artery disease include diabetes mellitus, dyslipidemia, hypertension, obesity, post-menopausal and family history.  Hypertension This is a chronic problem. The current episode started more than 1 year ago. The problem has been gradually improving since onset. The problem is controlled. Pertinent negatives include no chest pain, headaches, palpitations or shortness of breath. There are no associated agents  to hypertension. Risk factors for coronary artery disease include diabetes mellitus, dyslipidemia, obesity, sedentary lifestyle and post-menopausal state. Past treatments include beta blockers, diuretics and angiotensin blockers. The current treatment provides moderate improvement. There are no compliance problems.  Hypertensive end-organ damage includes kidney disease, CAD/MI and retinopathy. Identifiable causes of hypertension include chronic renal disease.    Review of systems  Constitutional: + steadily decreasing body weight,  current  Body mass index is 35.43 kg/m. , + fatigue (says side effect from gabapentin), no subjective hyperthermia, no subjective hypothermia Eyes: no blurry vision, no xerophthalmia ENT: no sore throat, no nodules palpated in throat, no dysphagia/odynophagia, no hoarseness Cardiovascular: no Chest Pain, no Shortness of Breath, no palpitations, no leg swelling Respiratory: no cough, no shortness of breath Gastrointestinal: no Nausea/Vomiting/Diarrhea Musculoskeletal: no muscle/joint aches Skin: no rashes, no hyperemia Neurological: no tremors, no numbness, no tingling, no dizziness Psychiatric: no depression, no anxiety   Objective:    Wt 233 lb (105.7 kg)    BMI 35.43 kg/m   Wt Readings from Last 3 Encounters:  03/10/20 233 lb (105.7 kg)  01/28/20 241 lb 6.4 oz (109.5 kg)  11/28/19 245 lb 3.2 oz (111.2 kg)    BP Readings from Last  3 Encounters:  01/28/20 130/80  11/28/19 (!) 150/78  10/26/19 118/62     Physical Exam- Telehealth-significantly limited due to nature of visit  Constitutional: Body mass index is 35.43 kg/m. , not in acute distress, normal state of mind Respiratory: Adequate breathing efforts   CMP     Component Value Date/Time   NA 140 02/29/2020 0000   K 4.0 02/29/2020 0000   CL 105 02/29/2020 0000   CO2 20 02/29/2020 0000   GLUCOSE 149 (H) 02/29/2020 0000   GLUCOSE 91 01/28/2020 1433   BUN 18 02/29/2020 0000   CREATININE 1.74 (H) 02/29/2020 0000   CREATININE 1.60 (H) 11/27/2018 1521   CALCIUM 8.8 02/29/2020 0000   PROT 7.2 02/29/2020 0000   ALBUMIN 4.2 02/29/2020 0000   AST 24 02/29/2020 0000   ALT 18 02/29/2020 0000   ALKPHOS 82 02/29/2020 0000   BILITOT 0.3 02/29/2020 0000   GFRNONAA 31 (L) 02/29/2020 0000   GFRNONAA 40 (L) 01/28/2020 1433   GFRNONAA 35 (L) 11/27/2018 1521   GFRAA 36 (L) 02/29/2020 0000   GFRAA 40 (L) 11/27/2018 1521     Diabetic Labs (most recent): Lab Results  Component Value Date   HGBA1C 6.7 (H) 02/29/2020   HGBA1C 8.3 (H) 10/26/2019   HGBA1C 7.2 (A) 06/22/2019     Lipid Panel ( most recent) Lipid Panel     Component Value Date/Time   CHOL 84 (L) 02/29/2020 0000   TRIG 86 02/29/2020 0000   HDL 25 (L) 02/29/2020 0000   CHOLHDL 3.4 02/29/2020 0000   CHOLHDL 3.7 10/26/2019 1238   VLDL 14 10/26/2019 1238   LDLCALC 41 02/29/2020 0000        Assessment & Plan:   1) DM type 2 causing vascular disease   - Keylin Ferryman has currently uncontrolled symptomatic type 2 DM since  59 years of age.  She presents today for her virtual visit with her meter and logs showing greatly improved glycemic profile overall.  Her previsit A1c was 6.7%, improved from last visit of 8.3%.  She called the clinic in between visits for hypoglycemia and her Glipizide was stopped.  She denies any significant hypoglycemia.  - I had a long discussion with her about  the progressive nature  of diabetes and the pathology behind its complications. -her diabetes is complicated by retinopathy, nephropathy, coronary artery disease and she remains at a high risk for more acute and chronic complications which include CAD, CVA, CKD, retinopathy, and neuropathy. These are all discussed in detail with her.  - Nutritional counseling repeated at each appointment due to patients tendency to fall back in to old habits.  - The patient admits there is a room for improvement in their diet and drink choices. -  Suggestion is made for the patient to avoid simple carbohydrates from their diet including Cakes, Sweet Desserts / Pastries, Ice Cream, Soda (diet and regular), Sweet Tea, Candies, Chips, Cookies, Sweet Pastries,  Store Bought Juices, Alcohol in Excess of  1-2 drinks a day, Artificial Sweeteners, Coffee Creamer, and "Sugar-free" Products. This will help patient to have stable blood glucose profile and potentially avoid unintended weight gain.   - I encouraged the patient to switch to  unprocessed or minimally processed complex starch and increased protein intake (animal or plant source), fruits, and vegetables.   - Patient is advised to stick to a routine mealtimes to eat 3 meals  a day and avoid unnecessary snacks ( to snack only to correct hypoglycemia).  - I have approached her with the following individualized plan to manage  her diabetes and patient agrees:   - she will benefit from simplification of her treatment regimen.  She will not need prandial insulin for now.    -Given her improved glycemic profile and absence of hypoglycemia, she is advised to continue current medication regimen consisting of Levemir 50 units SQ daily at bedtime and Trulicity 1.5 mg SQ weekly.  She can stay off the Glipizide for now.  -She is encouraged to continue monitoring blood glucose at least twice a day, before breakfast and before bed and report blood glucose less than 70 or above 300  for three tests in a row.  She did not do well with SGLT2 inhibitor Wilder Glade when it was prescribed by her cardiologist as it caused significant hypotension.  - she is warned not to take insulin without proper monitoring per orders.  - she is not a candidate for metformin, SGLT2 inhibitors due to concurrent renal insufficiency.  - Patient specific target  A1c;  LDL, HDL, Triglycerides, and  Waist Circumference were discussed in detail.  2) Blood Pressure /Hypertension:  -Her blood pressure is controlled to target according to previous visits says recent BP at home was 112/82. She is advised to continue Carvedilol 12.5 mg p.o. twice daily, Isosorbide mononitrate 30 mg p.o. daily, Entresto 97-103 mg p.o. twice daily, and Aldactone 25 mg p.o. daily.  3) Lipids/Hyperlipidemia:    Her most recent lipid panel from 02/29/20 shows controlled LDL at 41.  She is advised to continue Lipitor 80 mg po daily at bedtime.  Side effects and precautions discussed with her.  4)  Weight/Diet:  Her Body mass index is 35.43 kg/m.-   clearly complicating her diabetes care.  She is a candidate for continued, modest weight loss.  I discussed with her the fact that loss of 5 - 10% of her  current body weight will have the most impact on her diabetes management.  CDE Consult will be initiated . Exercise, and detailed carbohydrates information provided  -  detailed on discharge instructions.  5) Chronic Care/Health Maintenance: -she is on ACE/ARB and statin medications and is encouraged to initiate and continue to follow up with Ophthalmology, Dentist,  Podiatrist at least  yearly or according to recommendations, and advised to   stay away from smoking. I have recommended yearly flu vaccine and pneumonia vaccine at least every 5 years; moderate intensity exercise for up to 150 minutes weekly; and  sleep for at least 7 hours a day.  - she is advised to maintain close follow up with Margo Common, Jarvis Newcomer, MD for primary care  needs, as well as her other providers for optimal and coordinated care.  I spent 35 minutes dedicated to the care of this patient on the date of this encounter to include pre-visit review of records, face-to-face time with the patient, and post visit ordering of  Testing.    Please refer to Patient Instructions for Blood Glucose Monitoring and Insulin/Medications Dosing Guide"  in media tab for additional information. Please  also refer to " Patient Self Inventory" in the Media  tab for reviewed elements of pertinent patient history.  Stephanie Manning participated in the discussions, expressed understanding, and voiced agreement with the above plans.  All questions were answered to her satisfaction. she is encouraged to contact clinic should she have any questions or concerns prior to her return visit.   Follow up plan: - Return in about 4 months (around 07/09/2020) for Diabetes follow up with A1c in office, No previsit labs, Bring glucometer and logs, ABI next visit.  Rayetta Pigg, [facility] [address] [place], [place] [address] Phone: [phone] Fax: [phone]   03/10/2020, 11:42 AM

## 2020-04-01 ENCOUNTER — Telehealth: Payer: Self-pay | Admitting: Nurse Practitioner

## 2020-04-01 NOTE — Telephone Encounter (Signed)
Pt is calling and states since she has been taking Trulicity she keeps getting UTI and the doctor gave her antibiotic but it keeps coming back and believes this medicine is the cause.

## 2020-04-02 NOTE — Telephone Encounter (Signed)
Pt returning your call

## 2020-04-02 NOTE — Telephone Encounter (Signed)
Left a message requesting a return call to the office. 

## 2020-04-02 NOTE — Telephone Encounter (Signed)
UTIs are not a typical side effect from Trulicity.  Trulicity can cause nausea, vomiting, and diarrhea which can lead to dehydration if not careful.  It is not likely that the Trulicity is causing her UTIs.  I also looked through her other medications and I didn't see anything that stood out to me as a potential for causing it.  If she continues to get UTIs perhaps she needs a referral from her PCP to see a urologist to make sure it isn't anatomy related.

## 2020-04-02 NOTE — Telephone Encounter (Signed)
Discussed with pt

## 2020-05-21 ENCOUNTER — Other Ambulatory Visit: Payer: Self-pay

## 2020-05-21 DIAGNOSIS — E782 Mixed hyperlipidemia: Secondary | ICD-10-CM

## 2020-05-21 MED ORDER — ATORVASTATIN CALCIUM 80 MG PO TABS: 80.0000 mg | ORAL_TABLET | Freq: Every day | ORAL | 3 refills | Status: DC

## 2020-05-24 NOTE — Progress Notes (Unsigned)
Cardiology Office Note    Date:  05/28/2020   ID:  Chauntay, Paszkiewicz 16-Aug-1960, MRN [id number]  PCP:  [name], MD  Cardiologist: Dr. [name] Heart failure clinic: Dr. [name]  Chief Complaint  Patient presents with  . Congestive Heart Failure  . Coronary Artery Disease    History of Present Illness:  Stephanie Manning is a 60 y.o. female with PMH of CAD, DM II, CKD stage IV, HTN, HLD, CAD and ICM.  In December 2018 she had anterior MI with DES to totally occluded mid LAD.  She returned in January 2019 for staged DES to 80% RCA disease.  Echocardiogram in December 2018 showed ejection fraction of 30 to 35%.  Repeat echocardiogram in March 2019 continue to show ejection fraction 30 to 35%.  She has been followed by Dr [name] in the Advanced heart failure clinic.  Given her persistent low ejection fraction, she was referred to electrophysiology service for consideration of ICD.  She was admitted to the hospital on 10/10/2017 with recurrent chest pain.  She underwent Myoview which came back high risk.  She eventually underwent cardiac catheterization on 10/12/2017 which showed 75% prox LCx, 60% OM1, 40% LM, 100% mid LAD chronic occlusion post D2, 95% post lateral lesion.  Stents in the RCA and proximal LAD were patent.   She was recommended to continue aspirin and Brilinta for long-term, beyond 26-month  Carvedilol was reduced to 6.25 mg twice daily due to bradycardia, Mobitz type I and up to 2 dropped beats and dizziness.    She has been followed closely by Dr MAundra Dubinin the CHF clinic. She is on Coreg, Entresto, and aldactone. She had follow up with Echo in June 2020 and EF was unchanged. She has been seen by Dr ARayann Hemanfor consideration of ICD but has deferred. She is intolerant of Bidil due to lightheadedness/syncope. Also intolerant of FIran She was seen by Dr MAundra Dubinin November. EF improved to 40-45%. Noted aldactone had been held due to elevated potassium. This was resumed  at lower dose. Repeat potassium was 4.0  On follow up today she is doing very well. Denies any chest pain. Is very active moving into her new house and doing yard work. Notes a little SOB with walking. No edema and weight is down 8 lbs. She does note constipation on Trulicity. No palpitations.    Past Medical History:  Diagnosis Date  . AKI (acute kidney injury) ([facility] 02/2017  . CAD in native artery    a. NSTEMI 02/2017 s/p DES to mLAD, diffuse disease otherwise, EF 30-35%. b. Staged cath 03/2017 - patent stent in the proximal to mid LAD. CTO of the distal LAD, 90% diffuse small first diagonal, 90% distal LCx/OM, 80% diffuse prox RCA, moderately elevated LVEDP 250mg, s/p DES to mRCA, residual disease treated medically (too small for PCI), EF 30-35%.  . Chronic combined systolic and diastolic CHF (congestive heart failure) ([facility]  . CKD (chronic kidney disease), stage IV ([facility]   /nArchie Endo/09/2017  . Diabetes mellitus, type II, insulin dependent ([facility]  . GERD (gastroesophageal reflux disease)   . History of blood transfusion    "related to menses"  . History of stomach ulcers 2017  . Hyperlipidemia   . Hypertension   . Iron deficiency anemia 03/2017   "had to have iron infusions" (10/10/2017)  . Ischemic cardiomyopathy   . STEMI (ST elevation myocardial infarction) (HCBox12/2018    Past Surgical History:  Procedure  Laterality Date  . CESAREAN SECTION  1987  . CORONARY ANGIOPLASTY WITH STENT PLACEMENT  04/01/2017   drug-eluting stent was successfully placed using a STENT SYNERGY DES 9.03E09  . CORONARY STENT INTERVENTION N/A 04/01/2017   Procedure: CORONARY STENT INTERVENTION;  Surgeon: [name], [name], MD;  Location: [facility];  Service: Cardiovascular;  Laterality: N/A;  . CORONARY/GRAFT ACUTE MI REVASCULARIZATION N/A 03/10/2017   Procedure: Coronary/Graft Acute MI Revascularization;  Surgeon: [name], [name], MD;  Location: [facility];  Service: Cardiovascular;   Laterality: N/A;  . LEFT HEART CATH AND CORONARY ANGIOGRAPHY N/A 03/10/2017   Procedure: LEFT HEART CATH AND CORONARY ANGIOGRAPHY;  Surgeon: [name], [name], MD;  Location: [facility];  Service: Cardiovascular;  Laterality: N/A;  . LEFT HEART CATH AND CORONARY ANGIOGRAPHY N/A 04/01/2017   Procedure: LEFT HEART CATH AND CORONARY ANGIOGRAPHY;  Surgeon: [name], [name], MD;  Location: [facility];  Service: Cardiovascular;  Laterality: N/A;  . LEFT HEART CATH AND CORONARY ANGIOGRAPHY N/A 10/12/2017   Procedure: LEFT HEART CATH AND CORONARY ANGIOGRAPHY;  Surgeon: Sherren Mocha, MD;  Location: [facility];  Service: Cardiovascular;  Laterality: N/A;  . OOPHORECTOMY     "1 ovary removed"  . THROAT SURGERY  02/2014   "nodule in my throat removed; not related to thyroid"    Current Medications: Outpatient Medications Prior to Visit  Medication Sig Dispense Refill  . Accu-Chek Softclix Lancets lancets Use as instructed 100 each 2  . aspirin 81 MG chewable tablet Chew 1 tablet (81 mg total) by mouth daily.    Marland Kitchen atorvastatin (LIPITOR) 80 MG tablet Take 1 tablet (80 mg total) by mouth daily at 6 PM. 90 tablet 3  . blood glucose meter kit and supplies Dispense based on patient and insurance preference. Use up to four times daily as directed. (FOR ICD-10 E11.65) Pt prefers One touch mini 1 each 5  . carvedilol (COREG) 12.5 MG tablet Take 1 tablet (12.5 mg total) by mouth 2 (two) times daily with a meal. 180 tablet 3  . Dulaglutide (TRULICITY) 1.5 QZ/3.0QT SOPN Inject 1.5 mg into the skin once a week. 6 mL 3  . ELDERBERRY PO Take 2 tablets by mouth every other day.     . ferrous sulfate 300 (60 Fe) MG/5ML syrup Take 300 mg by mouth daily with breakfast.    . gabapentin (NEURONTIN) 300 MG capsule Take 300 mg by mouth at bedtime.     Marland Kitchen glucose blood (ACCU-CHEK GUIDE) test strip TEST BLOOD SUGAR FOUR TIMES A DAY BEFORE MEALS AND AT BEDTIME 150 strip 3  . insulin detemir (LEVEMIR  FLEXTOUCH) 100 UNIT/ML FlexPen Inject 50 Units into the skin at bedtime. INJECT 50 UNITS UNDER THE SKIN EVERY NIGHT AT BEDTIME 45 mL 3  . isosorbide mononitrate (IMDUR) 30 MG 24 hr tablet Take 1 tablet (30 mg total) by mouth daily. 90 tablet 3  . Multiple Vitamin (MULTIVITAMIN WITH MINERALS) TABS tablet Take 1 tablet by mouth daily.    . nitroGLYCERIN (NITROSTAT) 0.4 MG SL tablet Place 1 tablet (0.4 mg total) under the tongue every 5 (five) minutes as needed for chest pain. 25 tablet 3  . pantoprazole (PROTONIX) 40 MG tablet Take 1 tablet (40 mg total) by mouth 2 (two) times daily. 180 tablet 3  . sacubitril-valsartan (ENTRESTO) 97-103 MG Take 1 tablet by mouth 2 (two) times daily. 180 tablet 3  . Saline 0.2 % SOLN Place 1 spray into  both nostrils as needed (congestion).     Marland Kitchen spironolactone (ALDACTONE) 25 MG tablet Take 0.5 tablets (12.5 mg total) by mouth daily. 15 tablet 3  . ticagrelor (BRILINTA) 60 MG TABS tablet Take 1 tablet (60 mg total) by mouth 2 (two) times daily. 180 tablet 5  . tiZANidine (ZANAFLEX) 4 MG tablet Take 4 mg by mouth at bedtime.    Marland Kitchen VITAMIN D PO Take 1 tablet by mouth 2 (two) times a week. Mondays and Wednesdays     No facility-administered medications prior to visit.     Allergies:   Hydralazine, Bidil [isosorb dinitrate-hydralazine], Ciprofloxacin, Farxiga [dapagliflozin], Propoxycaine, Amoxicillin, Hydrocodone, Lisinopril, Meloxicam, Strawberry extract, Tape, and Toradol [ketorolac tromethamine]   Social History   Socioeconomic History  . Marital status: Widowed    Spouse name: Not on file  . Number of children: 2  . Years of education: Not on file  . Highest education level: Not on file  Occupational History  . Not on file  Tobacco Use  . Smoking status: Never Smoker  . Smokeless tobacco: Never Used  Vaping Use  . Vaping Use: Never used  Substance and Sexual Activity  . Alcohol use: Never  . Drug use: Never  . Sexual activity: Yes  Other Topics  Concern  . Not on file  Social History Narrative  . Not on file   Social Determinants of Health   Financial Resource Strain: Not on file  Food Insecurity: Not on file  Transportation Needs: Not on file  Physical Activity: Not on file  Stress: Not on file  Social Connections: Not on file     Family History:  The patient's family history includes Diabetes in her mother; Heart disease in her mother; Hypertension in her mother; Stroke in her mother.   ROS:   Please see the history of present illness.    ROS All other systems reviewed and are negative.   PHYSICAL EXAM:   VS:  BP (!) 150/71   Pulse 80   Ht '5\' 8"'  (1.727 m)   Wt 233 lb 9.6 oz (106 kg)   SpO2 99%   BMI 35.52 kg/m    GEN: Well nourished, well developed, in no acute distress  HEENT: normal  Neck: no JVD, carotid bruits, or masses Cardiac: RRR; no murmurs, rubs, or gallops,no edema  Respiratory:  clear to auscultation bilaterally, normal work of breathing GI: soft, nontender, nondistended, + BS MS: no deformity or atrophy  Skin: warm and dry, no rash Neuro:  Alert and Oriented x 3, Strength and sensation are intact Psych: euthymic mood, full affect  Wt Readings from Last 3 Encounters:  05/28/20 233 lb 9.6 oz (106 kg)  03/10/20 233 lb (105.7 kg)  01/28/20 241 lb 6.4 oz (109.5 kg)      Studies/Labs Reviewed:   EKG:  EKG is not ordered today.    Recent Labs: 06/05/2019: Hemoglobin 9.8; Platelets 183 10/26/2019: Magnesium 1.9 02/29/2020: ALT 18; BUN 18; Creatinine, Ser 1.74; Potassium 4.0; Sodium 140; TSH 2.260   Lipid Panel    Component Value Date/Time   CHOL 84 (L) 02/29/2020 0000   TRIG 86 02/29/2020 0000   HDL 25 (L) 02/29/2020 0000   CHOLHDL 3.4 02/29/2020 0000   CHOLHDL 3.7 10/26/2019 1238   VLDL 14 10/26/2019 1238   LDLCALC 41 02/29/2020 0000   Labs dated 04/30/19: Hgb 9.6. BUN 24, creatinine 1.62. otherwise CMET normal. Glucose 80  Additional studies/ records that were reviewed today  include:  Cath 10/12/2017  Ost LM lesion is 40% stenosed.  Prox Cx to Dist Cx lesion is 75% stenosed.  Ost 1st Mrg lesion is 60% stenosed.  Previously placed Prox LAD to Mid LAD stent (unknown type) is widely patent.  Mid LAD lesion is 100% stenosed.  Ost 2nd Diag to 2nd Diag lesion is 70% stenosed.  Prox RCA lesion is 30% stenosed.  Mid RCA lesion is 40% stenosed.  Post Atrio lesion is 95% stenosed.   1.  Continued patency of the stented segment in the proximal LAD and stented segment in the proximal RCA 2.  Progressive small vessel CAD involving the distal left circumflex and right posterolateral branch 3.  Chronic occlusion of the mid LAD beyond the second diagonal branch unchanged in appearance from the previous study 4.  Normal LVEDP  Recommendation: Ongoing aggressive medical therapy.  The patient's probable culprit lesion is a severe stenosis in the distal posterior lateral branch involving a small territory and small caliber vessel that I think would be best treated with medical therapy rather than intervention.  Recommend dual antiplatelet therapy with Aspirin 74m daily and Ticagrelor 989mtwice daily long-term (beyond 12 months) because of diffuse CAD/diabetes/multivessel stenting.  Echo 09/01/18: IMPRESSIONS    1. The left ventricle has moderate-severely reduced systolic function, with an ejection fraction of 30-35%. The cavity size was mildly dilated. Left ventricular diastolic Doppler parameters are consistent with pseudonormalization.  2. The right ventricle has normal systolic function. The cavity was normal.  3. The mitral valve is grossly normal.  4. The aortic valve is tricuspid. Mild thickening of the aortic valve. No stenosis of the aortic valve.  5. Technically difficult; definity used; akinesis of the apex with overall moderate to severe LV dysfunction; moderate diastolic dysfunction.  Echo 01/28/20: IMPRESSIONS    1. Left ventricular ejection  fraction, by estimation, is 40 to 45%. The  left ventricle has mildly decreased function. The left ventricle  demonstrates regional wall motion abnormalities with apical septal and  apical akinesis. There is mild left  ventricular hypertrophy. Left ventricular diastolic parameters are  consistent with Grade I diastolic dysfunction (impaired relaxation).  2. Right ventricular systolic function is normal. The right ventricular  size is normal. Tricuspid regurgitation signal is inadequate for assessing  PA pressure.  3. The mitral valve is normal in structure. No evidence of mitral valve  regurgitation. No evidence of mitral stenosis.  4. The aortic valve is tricuspid. Aortic valve regurgitation is not  visualized.  5. The inferior vena cava is normal in size with <50% respiratory  variability, suggesting right atrial pressure of 8 mmHg.   ASSESSMENT:    1. Chronic combined systolic and diastolic heart failure ([facility]  2. Ischemic cardiomyopathy   3. Coronary artery disease of native artery of native heart with stable angina pectoris ([facility]  4. DM type 2 causing vascular disease ([facility]  5. Mixed hyperlipidemia   6. CKD (chronic kidney disease), stage IV (HCC)      PLAN:  In order of problems listed above:  1. Chronic combined systolic and diastolic heart failure: EF better at 40-45%.  On maximal Coreg due to history of bradycardia and presence of Mobitz 1 heart block during prior  hospitalization.  On Entresto maximal dose and aldactone. Unable to tolerate Bidil due to lightheadedness but is tolerating Imdur OK.  Intolerant of FaIran  2. CAD: s/p DES of LAD in 2018 in setting of anterior MI. S/p DES of RCA in  January 2019. I reviewed her last cardiac cath in July 2019. She has extensive small distal branch vessel disease in distal LAD (CTO), small diagonal, distal LCX, and 95% PL lesion. these are not amenable to PCI due to small vessel size. High risk Myoview in July 2019 with  cardiac cath as noted. Medical management.  Class 1  angina. On DAPT, statin, Coreg and Imdur.   3. Hypertension: Blood pressure has been well controlled according to the patient.  4. Hyperlipidemia: On Lipitor 80 mg daily.  LDL 41.   5. DM2: managed by endocrinology. Currently on glipizide and insulin. Now on Trulicity. A1c down to 6.7%. recommend she take Psyllium daily for her bowels.    6.   CKD stage 3b  Follow up in 6 months.  Medication Adjustments/Labs and Tests Ordered: Current medicines are reviewed at length with the patient today.  Concerns regarding medicines are outlined above.  Medication changes, Labs and Tests ordered today are listed in the Patient Instructions below. Patient Instructions  Add psyllium daily to help with your constipation.      Signed, Peter [name], MD  05/28/2020 1:32 PM    [facility] [address], [place], [place]  [address] Phone: [phone]; Fax: [phone]

## 2020-05-28 ENCOUNTER — Ambulatory Visit: Payer: Medicare Other | Admitting: Cardiology

## 2020-05-28 ENCOUNTER — Other Ambulatory Visit: Payer: Self-pay

## 2020-05-28 ENCOUNTER — Ambulatory Visit (INDEPENDENT_AMBULATORY_CARE_PROVIDER_SITE_OTHER): Payer: Medicare Other | Admitting: Cardiology

## 2020-05-28 ENCOUNTER — Encounter: Payer: Self-pay | Admitting: Cardiology

## 2020-05-28 VITALS — BP 150/71 | HR 80 | Ht 68.0 in | Wt 233.6 lb

## 2020-05-28 DIAGNOSIS — E1159 Type 2 diabetes mellitus with other circulatory complications: Secondary | ICD-10-CM | POA: Diagnosis not present

## 2020-05-28 DIAGNOSIS — I255 Ischemic cardiomyopathy: Secondary | ICD-10-CM | POA: Diagnosis not present

## 2020-05-28 DIAGNOSIS — I25118 Atherosclerotic heart disease of native coronary artery with other forms of angina pectoris: Secondary | ICD-10-CM | POA: Diagnosis not present

## 2020-05-28 DIAGNOSIS — E782 Mixed hyperlipidemia: Secondary | ICD-10-CM

## 2020-05-28 DIAGNOSIS — I5042 Chronic combined systolic (congestive) and diastolic (congestive) heart failure: Secondary | ICD-10-CM

## 2020-05-28 DIAGNOSIS — N184 Chronic kidney disease, stage 4 (severe): Secondary | ICD-10-CM

## 2020-05-28 NOTE — Patient Instructions (Signed)
Add psyllium daily to help with your constipation.

## 2020-06-03 ENCOUNTER — Other Ambulatory Visit: Payer: Self-pay

## 2020-06-03 ENCOUNTER — Encounter (HOSPITAL_COMMUNITY): Payer: Self-pay | Admitting: Cardiology

## 2020-06-03 ENCOUNTER — Ambulatory Visit (HOSPITAL_COMMUNITY)
Admission: RE | Admit: 2020-06-03 | Discharge: 2020-06-03 | Disposition: A | Discharge status: Home / Self Care | Payer: Medicare Other | Source: Ambulatory Visit | Attending: Cardiology | Admitting: Cardiology

## 2020-06-03 VITALS — BP 154/80 | HR 79 | Wt 232.6 lb

## 2020-06-03 DIAGNOSIS — Z7902 Long term (current) use of antithrombotics/antiplatelets: Secondary | ICD-10-CM | POA: Insufficient documentation

## 2020-06-03 DIAGNOSIS — Z8249 Family history of ischemic heart disease and other diseases of the circulatory system: Secondary | ICD-10-CM | POA: Insufficient documentation

## 2020-06-03 DIAGNOSIS — N183 Chronic kidney disease, stage 3 unspecified: Secondary | ICD-10-CM | POA: Insufficient documentation

## 2020-06-03 DIAGNOSIS — Z794 Long term (current) use of insulin: Secondary | ICD-10-CM | POA: Diagnosis not present

## 2020-06-03 DIAGNOSIS — I255 Ischemic cardiomyopathy: Secondary | ICD-10-CM | POA: Insufficient documentation

## 2020-06-03 DIAGNOSIS — I252 Old myocardial infarction: Secondary | ICD-10-CM | POA: Diagnosis not present

## 2020-06-03 DIAGNOSIS — I130 Hypertensive heart and chronic kidney disease with heart failure and stage 1 through stage 4 chronic kidney disease, or unspecified chronic kidney disease: Secondary | ICD-10-CM | POA: Insufficient documentation

## 2020-06-03 DIAGNOSIS — I2510 Atherosclerotic heart disease of native coronary artery without angina pectoris: Secondary | ICD-10-CM | POA: Insufficient documentation

## 2020-06-03 DIAGNOSIS — I25118 Atherosclerotic heart disease of native coronary artery with other forms of angina pectoris: Secondary | ICD-10-CM

## 2020-06-03 DIAGNOSIS — I5042 Chronic combined systolic (congestive) and diastolic (congestive) heart failure: Secondary | ICD-10-CM

## 2020-06-03 DIAGNOSIS — Z7982 Long term (current) use of aspirin: Secondary | ICD-10-CM | POA: Insufficient documentation

## 2020-06-03 DIAGNOSIS — Z79899 Other long term (current) drug therapy: Secondary | ICD-10-CM | POA: Insufficient documentation

## 2020-06-03 DIAGNOSIS — Z955 Presence of coronary angioplasty implant and graft: Secondary | ICD-10-CM | POA: Insufficient documentation

## 2020-06-03 DIAGNOSIS — E785 Hyperlipidemia, unspecified: Secondary | ICD-10-CM | POA: Insufficient documentation

## 2020-06-03 DIAGNOSIS — D509 Iron deficiency anemia, unspecified: Secondary | ICD-10-CM | POA: Diagnosis not present

## 2020-06-03 DIAGNOSIS — E1122 Type 2 diabetes mellitus with diabetic chronic kidney disease: Secondary | ICD-10-CM | POA: Diagnosis not present

## 2020-06-03 LAB — BASIC METABOLIC PANEL
Anion gap: 5 (ref 5–15)
BUN: 24 mg/dL — ABNORMAL HIGH (ref 6–20)
CO2: 25 mmol/L (ref 22–32)
Calcium: 9.3 mg/dL (ref 8.9–10.3)
Chloride: 109 mmol/L (ref 98–111)
Creatinine, Ser: 1.64 mg/dL — ABNORMAL HIGH (ref 0.44–1.00)
GFR, Estimated: 36 mL/min — ABNORMAL LOW (ref >60)
Glucose, Bld: 99 mg/dL (ref 70–99)
Potassium: 4.4 mmol/L (ref 3.5–5.1)
Sodium: 139 mmol/L (ref 135–145)

## 2020-06-03 MED ORDER — SPIRONOLACTONE 25 MG PO TABS: 25.0000 mg | ORAL_TABLET | Freq: Every day | ORAL | 6 refills | Status: DC

## 2020-06-03 NOTE — Patient Instructions (Addendum)
Increase Spironolactone to 25 mg Daily  Labs done today, your results will be available in MyChart, we will contact you for abnormal readings.  Your physician recommends that you return for lab work in: 1 week, this can be done at your Primary Care doctors office  Please call our office in June to schedule your follow up appointment  If you have any questions or concerns before your next appointment please send [name] a message through [place] or call our office at [phone].    TO LEAVE A MESSAGE FOR THE NURSE SELECT OPTION 2, PLEASE LEAVE A MESSAGE INCLUDING: . YOUR NAME . DATE OF BIRTH . CALL BACK NUMBER . REASON FOR CALL**this is important as we prioritize the call backs  [place] AS LONG AS YOU CALL BEFORE 4:00 PM  At the [facility], you and your health needs are our priority. As part of our continuing mission to provide you with exceptional heart care, we have created designated Provider Care Teams. These Care Teams include your primary Cardiologist (physician) and Advanced Practice Providers (APPs- Physician Assistants and Nurse Practitioners) who all work together to provide you with the care you need, when you need it.   You may see any of the following providers on your designated Care Team at your next follow up: Marland Kitchen Dr Glori Bickers . Dr Loralie Champagne . Dr Vickki Muff . Darrick Grinder, NP . Lyda Jester, [place] . Audry Riles, PharmD   Please be sure to bring in all your medications bottles to every appointment.

## 2020-06-04 NOTE — Progress Notes (Signed)
Date:  06/04/2020   ID:  Stephanie Manning, DOB [birth year], MRN [id number]  Provider location: [facility] Type of Visit: Established patient  PCP:  [name], MD  HF Cardiology: Dr. [name]   History of Present Illness: Stephanie Manning is a 60 y.o. female who has a history of CAD and ischemic cardiomyopathy.  In 12/18, she had anterior MI with DES to totally occluded mid LAD (culprit). She returned in 1/19 for staged DES to 80% RCA stenosis. Echo in 12/18 showed EF 30-35%.  Repeat echo in 3/19 showed EF stable at 30-35%.    She was admitted in 7/19 with chest pain, concern for unstable angina.  Culprit of symptoms was thought to be 95% stenosis in a small PLV vessel, medical treatment planned.   She saw Dr. Rayann Heman and decided against ICD.   Echo was done in 6/20 showing stable EF 30-35%.   She was unable to tolerate Bidil 1/2 tablet tid due to lightheadedness. She was unable to tolerate dapagliflozin.   Echo in 11/21 showed EF 40-45% with apical septal and apical akinesis, normal RV.   She returns for followup of CHF and CAD.  Weight is down 9 lbs.  She has been doing well, no chest pain.  Doing a lot of home improvement projects on a new house.  Short of breath walking fast up stairs, otherwise no problems.  No palpitations or lightheadedness.  BP elevated today.   Labs (2/19): K 4.7, creatinine 1.48, hgb 8.4 Labs (5/19): LDL 65, K 4.7, creatinine 1.66 Labs (8/19): K 5, creatinine 1.78  Labs (9/19): K 3.8, creatinine 1.6 Labs (12/19): K 3.9, creatinine 1.59 Labs (3/20): LDL 73, HDL 41 Labs (5/20): K 4.1, creatinine 1.59, LDL 73 Labs (9/20): K 4.2, creatinine 1.6 Labs (11/20): K 4.1, creatinine 1.7, LDL 50, HDL 32 Labs (3/21): K 4.1, creatinine 1.67 Labs (8/21): LDL 76, HDL 30, K 4, creatinine 1.86 Labs (12/21): LDL 41, HDL 25, TSH normal, K 4, creatinine 1.74  PMH: 1. Type II diabetes 2. CKD stage 3.  3. Hyperlipidemia 4.  HTN 5. Fe deficiency anemia 6. GERD 7. CAD: Anterior MI in 12/18 with DES to 100% stenosed mid LAD (culprit), also with CTO of distal LAD, 80% pRCA, 85% mLCx/90% dLCx, 90% OM3.  - 1/19 staged PCI of proximal RCA.  - Admitted with unstable angina 7/19.  LHC: 75% stenosis proximal-mid LCx, 60% OM1, total occlusion of the distal LAD, patent mid LAD stent, patent RCA stents, 95% stenosis in small PLV branch (likely culprit).  8. Chronic systolic CHF: Ischemic cardiomyopathy.  - Echo (12/18): EF 30-35%.  - Echo (3/19): EF 30-35%, WMAs in LAD territory, RV mildly dilated.  - Echo (6/20): EF 30-35%, mild LV dilation, normal RV size and systolic function.  - Echo (11/21): EF 40-45% with apical septal and apical akinesis, normal RV.  Current Outpatient Medications  Medication Sig Dispense Refill  . Accu-Chek Softclix Lancets lancets Use as instructed 100 each 2  . aspirin 81 MG chewable tablet Chew 1 tablet (81 mg total) by mouth daily.    Marland Kitchen atorvastatin (LIPITOR) 80 MG tablet Take 1 tablet (80 mg total) by mouth daily at 6 PM. 90 tablet 3  . blood glucose meter kit and supplies Dispense based on patient and insurance preference. Use up to four times daily as directed. (FOR ICD-10 E11.65) Pt prefers One touch mini 1 each 5  . carvedilol (COREG) 12.5 MG tablet  Take 1 tablet (12.5 mg total) by mouth 2 (two) times daily with a meal. 180 tablet 3  . diphenhydrAMINE (BENADRYL) 25 mg capsule Take 25 mg by mouth as needed.    . Dulaglutide (TRULICITY) 1.5 YC/1.4GY SOPN Inject 1.5 mg into the skin once a week. 6 mL 3  . ELDERBERRY PO Take 2 tablets by mouth every other day.     . ferrous sulfate 300 (60 Fe) MG/5ML syrup Take 300 mg by mouth daily with breakfast.    . gabapentin (NEURONTIN) 300 MG capsule Take 300 mg by mouth at bedtime.     Marland Kitchen glipiZIDE (GLUCOTROL) 5 MG tablet Take by mouth daily before breakfast.    . glucose blood (ACCU-CHEK GUIDE) test strip TEST BLOOD SUGAR FOUR TIMES A DAY BEFORE MEALS  AND AT BEDTIME 150 strip 3  . isosorbide mononitrate (IMDUR) 30 MG 24 hr tablet Take 1 tablet (30 mg total) by mouth daily. 90 tablet 3  . Multiple Vitamin (MULTIVITAMIN WITH MINERALS) TABS tablet Take 1 tablet by mouth daily.    . nitroGLYCERIN (NITROSTAT) 0.4 MG SL tablet Place 1 tablet (0.4 mg total) under the tongue every 5 (five) minutes as needed for chest pain. 25 tablet 3  . pantoprazole (PROTONIX) 40 MG tablet Take 1 tablet (40 mg total) by mouth 2 (two) times daily. 180 tablet 3  . sacubitril-valsartan (ENTRESTO) 97-103 MG Take 1 tablet by mouth 2 (two) times daily. 180 tablet 3  . Saline 0.2 % SOLN Place 1 spray into both nostrils as needed (congestion).     . ticagrelor (BRILINTA) 60 MG TABS tablet Take 1 tablet (60 mg total) by mouth 2 (two) times daily. 180 tablet 5  . tiZANidine (ZANAFLEX) 4 MG tablet Take 4 mg by mouth at bedtime.    Marland Kitchen VITAMIN D PO Take 1 tablet by mouth 2 (two) times a week. Mondays and Wednesdays    . insulin detemir (LEVEMIR FLEXTOUCH) 100 UNIT/ML FlexPen Inject 50 Units into the skin at bedtime. INJECT 50 UNITS UNDER THE SKIN EVERY NIGHT AT BEDTIME 45 mL 3  . spironolactone (ALDACTONE) 25 MG tablet Take 1 tablet (25 mg total) by mouth daily. 30 tablet 6   No current facility-administered medications for this encounter.    Allergies:   Hydralazine, Bidil [isosorb dinitrate-hydralazine], Ciprofloxacin, Farxiga [dapagliflozin], Propoxycaine, Amoxicillin, Hydrocodone, Lisinopril, Meloxicam, Strawberry extract, Tape, and Toradol [ketorolac tromethamine]   Social History:  The patient  reports that she has never smoked. She has never used smokeless tobacco. She reports that she does not drink alcohol and does not use drugs.   Family History:  The patient's family history includes Diabetes in her mother; Heart disease in her mother; Hypertension in her mother; Stroke in her mother.   ROS:  Please see the history of present illness.   All other systems are  personally reviewed and negative.   Exam:   BP (!) 154/80   Pulse 79   Wt 105.5 kg (232 lb 9.6 oz)   SpO2 99%   BMI 35.37 kg/m  General: NAD Neck: No JVD, no thyromegaly or thyroid nodule.  Lungs: Clear to auscultation bilaterally with normal respiratory effort. CV: Nondisplaced PMI.  Heart regular S1/S2, no S3/S4, no murmur.  No peripheral edema.  No carotid bruit.  Normal pedal pulses.  Abdomen: Soft, nontender, no hepatosplenomegaly, no distention.  Skin: Intact without lesions or rashes.  Neurologic: Alert and oriented x 3.  Psych: Normal affect. Extremities: No clubbing or cyanosis.  HEENT:  Normal.   Recent Labs: 10/26/2019: Magnesium 1.9 02/29/2020: ALT 18; TSH 2.260 06/03/2020: BUN 24; Creatinine, Ser 1.64; Potassium 4.4; Sodium 139  Personally reviewed   Wt Readings from Last 3 Encounters:  06/03/20 105.5 kg (232 lb 9.6 oz)  05/28/20 106 kg (233 lb 9.6 oz)  03/10/20 105.7 kg (233 lb)    ASSESSMENT AND PLAN:  1. CAD: Anterior MI in 12/18 with DES to proximal LAD (culprit) followed by staged DES to proximal RCA.  7/19 admission with unstable angina, likely culprit was 95% stenosis in small PLV.  This was managed conservatively.  No recent chest pain.       - Continue atorvastatin, lipids acceptable in 8/21.    - Continue ASA 81 daily and ticagrelor 60 mg bid.  - Continue Imdur 30 mg daily.  2. Chronic systolic CHF: Ischemic cardiomyopathy. EF somewhat higher on today's echo, 40-45%.  NYHA class II symptoms.  She is not volume overloaded.   - Continue Coreg 12.5 mg bid.     - Continue Entresto 97/103 bid. BMET today.   - Can continue prn use of Lasix.  - Increase spironolactone to 25 mg daily, check BMET 10 days.  - She was unable to tolerate Bidil at lowest does due to lightheadedness.   - She was unable to tolerate dapagliflozin.   - She is now out of ICD range. When EF was lower, she refused ICD.    3. CKD: stage 3.   - BMET today.  4. Fe deficiency anemia:  Long-standing.  She has had IV Fe infusions.  5. Type II DM: Unable to tolerate dapagliflozin.   Followup in 4 months.     Signed, Loralie Champagne, MD  06/04/2020  [facility] [address] [facility] [place] [address] [phone] (office) [phone] (fax)

## 2020-06-12 ENCOUNTER — Telehealth: Payer: Self-pay | Admitting: Nurse Practitioner

## 2020-06-12 DIAGNOSIS — Z794 Long term (current) use of insulin: Secondary | ICD-10-CM

## 2020-06-12 NOTE — Telephone Encounter (Signed)
Ok, will await glucose numbers.

## 2020-06-12 NOTE — Telephone Encounter (Signed)
Patient is asking for a call back on the voicemail with her readings

## 2020-06-12 NOTE — Telephone Encounter (Signed)
She can just stop it altogether at this time to see if her constipation improves.  Constipation is not one of the common side effects of that medication though (usually we see diarrhea).

## 2020-06-12 NOTE — Telephone Encounter (Signed)
Discussed with pt, understanding voice. She stated she needed a Rx for levemir because she has been taking 15-20 units of levemir in the morning after breakfast as well as the 50 units at bedtime "to cover where I'm not taking the trulicity". Pt was not able to give me her BG readings at time of call, I asked her to call back with her readings as soon as she could to ensure if that is the regimen you would suggest she keeps.

## 2020-06-12 NOTE — Telephone Encounter (Signed)
Pt is calling and states that the trulicity is making her stay constipated and she has to go off of it for 2 weeks at a time. She is requesting a call back

## 2020-06-12 NOTE — Telephone Encounter (Signed)
Returned call. No answer.  

## 2020-06-13 NOTE — Telephone Encounter (Signed)
Her doubling up on the Levemir (twice daily) is contributing to her hypoglycemia.  Since she stopped the Trulicity, she can increase her night time dose of Levemir to 60 units for now.  We need to avoid low glucose reading for safety purposes.

## 2020-06-13 NOTE — Telephone Encounter (Signed)
Pt states that she" will not be changing the way she takes her insulin and also states that we do not know what we are talking because she says Levemir is not a long acting insulin". She is requesting a call back from [facility]. I advised her that Loree Fee has left for the day but I would send a message.

## 2020-06-13 NOTE — Telephone Encounter (Signed)
    Date Before breakfast Before lunch Before supper Bedtime  3/31 57   197  3/30 58   115  3/29 62     3/28 124   79    Pt taking: Levemir 15-25 units at lunchtime and 50 units. I explained that she should only be taking this qhs. Pt states that she has to take it twice a day due to constipation from the Trulicity which she is no longer taking because she states this caused constipation.

## 2020-06-16 MED ORDER — LEVEMIR FLEXTOUCH 100 UNIT/ML ~~LOC~~ SOPN: 60.0000 [IU] | PEN_INJECTOR | Freq: Every day | SUBCUTANEOUS | 3 refills | Status: DC

## 2020-06-16 NOTE — Telephone Encounter (Signed)
I called her and attempted to explain that I believe her hypoglycemia is caused by the overlapping of the Levemir since she is taking her 50 units at bedtime and now an additional amount during the day "if her glucose is higher than her goal." (She alters her dose giving up to 15 units of Levemir between 10-12). She does not think the Levemir lasts as long as it should and does not give her optimal control of her glucose readings.  She is still having some fasting hypoglycemia but she thinks it is because she has been more physically active picking up rocks in the yard.  She still wants to take the Trulicity (even though it is contributing to her constipation) because of the added benefit of weight loss, but does not want to take it every week.  She has been skipping 2 weeks in between.  I also discussed that Trulicity does not typically cause constipation, that it usually causes upset stomach with diarrhea instead.  I cautioned her not to adjust her doses without first contacting her providers for safety purposes.

## 2020-06-16 NOTE — Telephone Encounter (Signed)
Have you contacted this patient? Checking to see if okay to close the encounter.

## 2020-06-27 ENCOUNTER — Telehealth: Payer: Self-pay | Admitting: Cardiology

## 2020-06-27 MED ORDER — TICAGRELOR 60 MG PO TABS: 60.0000 mg | ORAL_TABLET | Freq: Two times a day (BID) | ORAL | 3 refills | Status: DC

## 2020-06-27 NOTE — Telephone Encounter (Signed)
*  STAT* If patient is at the pharmacy, call can be transferred to refill team.   1. Which medications need to be refilled? (please list name of each medication and dose if known) ticagrelor (BRILINTA) 60 MG TABS tablet  2. Which pharmacy/location (including street and city if local pharmacy) is medication to be sent to? [facility], [place]  3. Do they need a 30 day or 90 day supply? 90  Patient states the refill was sent to wrong pharmacy. Patient is asking the this be refill as soon as possible because she has been out for 2 weeks now

## 2020-07-02 ENCOUNTER — Telehealth: Payer: Self-pay | Admitting: Cardiology

## 2020-07-02 NOTE — Telephone Encounter (Signed)
Pharmacy changed in chart. Left message for patient on VM (okay Per DPR) that pharmacy has been updated and to call back to office with any issues, questions, or concerns.

## 2020-07-02 NOTE — Telephone Encounter (Signed)
   Pt requesting to delete Preferred Pharmacies    [facility] [place], [place]   She no longer going to use this pharmacy. All future refills please send it to  [facility], [place]

## 2020-07-10 ENCOUNTER — Ambulatory Visit (INDEPENDENT_AMBULATORY_CARE_PROVIDER_SITE_OTHER): Payer: Medicare Other | Admitting: Nurse Practitioner

## 2020-07-10 ENCOUNTER — Other Ambulatory Visit: Payer: Self-pay

## 2020-07-10 ENCOUNTER — Encounter: Payer: Self-pay | Admitting: Nurse Practitioner

## 2020-07-10 VITALS — BP 154/89 | HR 70 | Ht 68.0 in | Wt 229.0 lb

## 2020-07-10 DIAGNOSIS — E1122 Type 2 diabetes mellitus with diabetic chronic kidney disease: Secondary | ICD-10-CM

## 2020-07-10 DIAGNOSIS — I10 Essential (primary) hypertension: Secondary | ICD-10-CM

## 2020-07-10 DIAGNOSIS — E782 Mixed hyperlipidemia: Secondary | ICD-10-CM | POA: Diagnosis not present

## 2020-07-10 DIAGNOSIS — N1832 Chronic kidney disease, stage 3b: Secondary | ICD-10-CM

## 2020-07-10 DIAGNOSIS — Z794 Long term (current) use of insulin: Secondary | ICD-10-CM

## 2020-07-10 MED ORDER — LEVEMIR FLEXTOUCH 100 UNIT/ML ~~LOC~~ SOPN: 50.0000 [IU] | PEN_INJECTOR | Freq: Every day | SUBCUTANEOUS | 3 refills | Status: DC

## 2020-07-10 MED ORDER — GLIPIZIDE 5 MG PO TABS: 5.0000 mg | ORAL_TABLET | Freq: Every day | ORAL | 3 refills | Status: DC

## 2020-07-10 NOTE — Progress Notes (Signed)
07/10/2020, 10:40 AM  Endocrinology Follow Up Visit   Subjective:    Patient ID: Stephanie Manning, female    DOB: [birth year].  Stephanie Manning is being seen in follow-up  for management of currently uncontrolled symptomatic diabetes requested by  [name], MD.   Past Medical History:  Diagnosis Date  . AKI (acute kidney injury) ([place]) 02/2017  . CAD in native artery    a. NSTEMI 02/2017 s/p DES to mLAD, diffuse disease otherwise, EF 30-35%. b. Staged cath 03/2017 - patent stent in the proximal to mid LAD. CTO of the distal LAD, 90% diffuse small first diagonal, 90% distal LCx/OM, 80% diffuse prox RCA, moderately elevated LVEDP 30mHg, s/p DES to mRCA, residual disease treated medically (too small for PCI), EF 30-35%.  . Chronic combined systolic and diastolic CHF (congestive heart failure) ([facility]   . CKD (chronic kidney disease), stage IV ([place]    /[name]2/09/2017  . Diabetes mellitus, type II, insulin dependent ([facility]   . GERD (gastroesophageal reflux disease)   . History of blood transfusion    "related to menses"  . History of stomach ulcers 2017  . Hyperlipidemia   . Hypertension   . Iron deficiency anemia 03/2017   "had to have iron infusions" (10/10/2017)  . Ischemic cardiomyopathy   . STEMI (ST elevation myocardial infarction) ([facility] 02/2017    Past Surgical History:  Procedure Laterality Date  . CESAREAN SECTION  1987  . CORONARY ANGIOPLASTY WITH STENT PLACEMENT  04/01/2017   drug-eluting stent was successfully placed using a STENT SYNERGY DES 28.54O27 . CORONARY STENT INTERVENTION N/A 04/01/2017   Procedure: CORONARY STENT INTERVENTION;  Surgeon: JMartinique [name], MD;  [facility];  Service: Cardiovascular;  Laterality: N/A;  . CORONARY/GRAFT ACUTE MI REVASCULARIZATION N/A 03/10/2017   Procedure: Coronary/Graft Acute MI Revascularization;  Surgeon: JMartinique Peter  M, MD;  [facility];  Service: Cardiovascular;  Laterality: N/A;  . LEFT HEART CATH AND CORONARY ANGIOGRAPHY N/A 03/10/2017   Procedure: LEFT HEART CATH AND CORONARY ANGIOGRAPHY;  Surgeon: JMartinique [name], MD;  [facility];  Service: Cardiovascular;  Laterality: N/A;  . LEFT HEART CATH AND CORONARY ANGIOGRAPHY N/A 04/01/2017   Procedure: LEFT HEART CATH AND CORONARY ANGIOGRAPHY;  Surgeon: JMartinique [name], MD;  [facility];  Service: Cardiovascular;  Laterality: N/A;  . LEFT HEART CATH AND CORONARY ANGIOGRAPHY N/A 10/12/2017   Procedure: LEFT HEART CATH AND CORONARY ANGIOGRAPHY;  Surgeon: CSherren Mocha MD;  [facility];  Service: Cardiovascular;  Laterality: N/A;  . OOPHORECTOMY     "1 ovary removed"  . THROAT SURGERY  02/2014   "nodule in my throat removed; not related to thyroid"    Social History   Socioeconomic History  . Marital status: Widowed    Spouse name: Not on file  . Number of children: 2  . Years of education: Not on file  . Highest education level: Not on file  Occupational History  . Not on file  Tobacco Use  . Smoking status: Never Smoker  .  Smokeless tobacco: Never Used  Vaping Use  . Vaping Use: Never used  Substance and Sexual Activity  . Alcohol use: Never  . Drug use: Never  . Sexual activity: Yes  Other Topics Concern  . Not on file  Social History Narrative  . Not on file   Social Determinants of Health   Financial Resource Strain: Not on file  Food Insecurity: Not on file  Transportation Needs: Not on file  Physical Activity: Not on file  Stress: Not on file  Social Connections: Not on file    Family History  Problem Relation Age of Onset  . Hypertension Mother   . Heart disease Mother   . Diabetes Mother   . Stroke Mother     Outpatient Encounter Medications as of 07/10/2020  Medication Sig  . Accu-Chek Softclix Lancets lancets Use as instructed  . aspirin 81 MG  chewable tablet Chew 1 tablet (81 mg total) by mouth daily.  Marland Kitchen atorvastatin (LIPITOR) 80 MG tablet Take 1 tablet (80 mg total) by mouth daily at 6 PM.  . blood glucose meter kit and supplies Dispense based on patient and insurance preference. Use up to four times daily as directed. (FOR ICD-10 E11.65) Pt prefers One touch mini  . carvedilol (COREG) 12.5 MG tablet Take 1 tablet (12.5 mg total) by mouth 2 (two) times daily with a meal.  . ELDERBERRY PO Take 2 tablets by mouth every other day.   . ferrous sulfate 300 (60 Fe) MG/5ML syrup Take 300 mg by mouth daily with breakfast.  . gabapentin (NEURONTIN) 300 MG capsule Take 300 mg by mouth at bedtime.   Marland Kitchen glucose blood (ACCU-CHEK GUIDE) test strip TEST BLOOD SUGAR FOUR TIMES A DAY BEFORE MEALS AND AT BEDTIME  . Multiple Vitamin (MULTIVITAMIN WITH MINERALS) TABS tablet Take 1 tablet by mouth daily.  . nitroGLYCERIN (NITROSTAT) 0.4 MG SL tablet Place 1 tablet (0.4 mg total) under the tongue every 5 (five) minutes as needed for chest pain.  . pantoprazole (PROTONIX) 40 MG tablet Take 1 tablet (40 mg total) by mouth 2 (two) times daily.  . sacubitril-valsartan (ENTRESTO) 97-103 MG Take 1 tablet by mouth 2 (two) times daily.  . Saline 0.2 % SOLN Place 1 spray into both nostrils as needed (congestion).   Marland Kitchen spironolactone (ALDACTONE) 25 MG tablet Take 1 tablet (25 mg total) by mouth daily.  . ticagrelor (BRILINTA) 60 MG TABS tablet Take 1 tablet (60 mg total) by mouth 2 (two) times daily.  Marland Kitchen tiZANidine (ZANAFLEX) 4 MG tablet Take 4 mg by mouth at bedtime.  Marland Kitchen VITAMIN D PO Take 1 tablet by mouth 2 (two) times a week. Mondays and Wednesdays  . [DISCONTINUED] diphenhydrAMINE (BENADRYL) 25 mg capsule Take 25 mg by mouth as needed.  . Dulaglutide (TRULICITY) 1.5 PI/9.5JO SOPN Inject 1.5 mg into the skin once a week. (Patient not taking: Reported on 07/10/2020)  . glipiZIDE (GLUCOTROL) 5 MG tablet Take by mouth daily before breakfast. (Patient not taking: Reported  on 07/10/2020)  . insulin detemir (LEVEMIR FLEXTOUCH) 100 UNIT/ML FlexPen Inject 60 Units into the skin at bedtime. (Patient not taking: Reported on 07/10/2020)  . isosorbide mononitrate (IMDUR) 30 MG 24 hr tablet Take 1 tablet (30 mg total) by mouth daily.   No facility-administered encounter medications on file as of 07/10/2020.    ALLERGIES: Allergies  Allergen Reactions  . Hydralazine Other (See Comments)    Syncope due to hypotension  . Bidil [Isosorb Dinitrate-Hydralazine]   . Ciprofloxacin   .  Farxiga [Dapagliflozin]   . Propoxycaine   . Amoxicillin Other (See Comments)    Yeast infection during treatment  . Hydrocodone Palpitations, Rash and Other (See Comments)    GI upset, also  . Lisinopril Palpitations and Other (See Comments)    Chest pain, too  . Meloxicam Nausea Only  . Strawberry Extract Hives    Does not always happen  . Tape Rash    Some tapes cause rashes  . Toradol [Ketorolac Tromethamine] Palpitations    VACCINATION STATUS:  There is no immunization history on file for this patient.  Diabetes She presents for her follow-up diabetic visit. She has type 2 diabetes mellitus. Onset time: She was diagnosed at approximate age of 44 years. Her disease course has been fluctuating. There are no hypoglycemic associated symptoms. Pertinent negatives for hypoglycemia include no confusion, headaches, pallor or seizures. Associated symptoms include fatigue and polyuria. Pertinent negatives for diabetes include no chest pain, no polydipsia and no polyphagia. There are no hypoglycemic complications. Symptoms are stable. Diabetic complications include heart disease, nephropathy, peripheral neuropathy and retinopathy. Risk factors for coronary artery disease include diabetes mellitus, dyslipidemia, hypertension, obesity, sedentary lifestyle and post-menopausal. Current diabetic treatment includes insulin injections. She is compliant with treatment most of the time. Her weight is  decreasing steadily. She is following a generally unhealthy diet. When asked about meal planning, she reported none. She has not had a previous visit with a dietitian. She never participates in exercise. Her home blood glucose trend is fluctuating dramatically. (She presents today with her meter, no logs, showing increasing glycemic profile over the last few weeks.  She says she has been out of her insulin for the last 3 weeks, despite me sending in refill for her due to insurance.  Her previsit A1c was 6.9%, increasing slightly from last visit of 6.7%.  She no longer takes the Trulicity due to constipation and does not take the Glipizide because of hypoglycemia.  She denies any recent hypoglycemia.) An ACE inhibitor/angiotensin II receptor blocker is being taken. She does not see a podiatrist.Eye exam is current.  Hyperlipidemia This is a chronic problem. The current episode started more than 1 year ago. The problem is controlled. Recent lipid tests were reviewed and are normal. Exacerbating diseases include chronic renal disease, diabetes and obesity. Factors aggravating her hyperlipidemia include fatty foods and beta blockers. Pertinent negatives include no chest pain, myalgias or shortness of breath. Current antihyperlipidemic treatment includes statins. The current treatment provides mild improvement of lipids. Compliance problems include adherence to diet and adherence to exercise.  Risk factors for coronary artery disease include diabetes mellitus, dyslipidemia, hypertension, obesity, post-menopausal and family history.  Hypertension This is a chronic problem. The current episode started more than 1 year ago. The problem has been gradually improving since onset. The problem is controlled. Pertinent negatives include no chest pain, headaches, palpitations or shortness of breath. There are no associated agents to hypertension. Risk factors for coronary artery disease include diabetes mellitus, dyslipidemia,  obesity, sedentary lifestyle and post-menopausal state. Past treatments include beta blockers, diuretics and angiotensin blockers. The current treatment provides moderate improvement. Compliance problems include diet and exercise.  Hypertensive end-organ damage includes kidney disease, CAD/MI and retinopathy. Identifiable causes of hypertension include chronic renal disease.    Review of systems  Constitutional: + steadily decreasing body weight,  current  Body mass index is 34.82 kg/m. , + fatigue (says side effect from gabapentin), no subjective hyperthermia, no subjective hypothermia Eyes: no blurry vision, no  xerophthalmia ENT: no sore throat, no nodules palpated in throat, no dysphagia/odynophagia, no hoarseness Cardiovascular: no Chest Pain, no Shortness of Breath, no palpitations, no leg swelling Respiratory: no cough, no shortness of breath Gastrointestinal: no Nausea/Vomiting/Diarrhea Musculoskeletal: no muscle/joint aches Skin: no rashes, no hyperemia Neurological: no tremors, no numbness, no tingling, no dizziness Psychiatric: no depression, no anxiety   Objective:    Ht '5\' 8"'  (1.727 m)   Wt 229 lb (103.9 kg)   BMI 34.82 kg/m   Wt Readings from Last 3 Encounters:  07/10/20 229 lb (103.9 kg)  06/03/20 232 lb 9.6 oz (105.5 kg)  05/28/20 233 lb 9.6 oz (106 kg)    BP Readings from Last 3 Encounters:  06/03/20 (!) 154/80  05/28/20 (!) 150/71  01/28/20 130/80    Physical Exam- Limited  Constitutional:  Body mass index is 34.82 kg/m. , not in acute distress, normal state of mind Eyes:  EOMI, no exophthalmos Neck: Supple Cardiovascular: RRR, no murmers, rubs, or gallops, no edema Respiratory: Adequate breathing efforts, no crackles, rales, rhonchi, or wheezing Musculoskeletal: no gross deformities, strength intact in all four extremities, no gross restriction of joint movements Skin:  no rashes, no hyperemia Neurological: no tremor with outstretched hands   Foot  exam:   No rashes, ulcers, cuts, calluses, onychodystrophy.   Good pulses bilat.  Good sensation to 10 g monofilament bilat.   POCT ABI Results 07/10/20   Right ABI:  1.24      Left ABI:  1.27  Right leg systolic / diastolic: 606/301 mmHg Left leg systolic / diastolic: 601/093 mmHg  Arm systolic / diastolic: 235/57 mmHG  Detailed report will be scanned into patient chart.    CMP     Component Value Date/Time   NA 139 06/03/2020 1444   NA 140 02/29/2020 0000   K 4.4 06/03/2020 1444   CL 109 06/03/2020 1444   CO2 25 06/03/2020 1444   GLUCOSE 99 06/03/2020 1444   BUN 24 (H) 06/03/2020 1444   BUN 18 02/29/2020 0000   CREATININE 1.64 (H) 06/03/2020 1444   CREATININE 1.60 (H) 11/27/2018 1521   CALCIUM 9.3 06/03/2020 1444   PROT 7.2 02/29/2020 0000   ALBUMIN 4.2 02/29/2020 0000   AST 24 02/29/2020 0000   ALT 18 02/29/2020 0000   ALKPHOS 82 02/29/2020 0000   BILITOT 0.3 02/29/2020 0000   GFRNONAA 36 (L) 06/03/2020 1444   GFRNONAA 35 (L) 11/27/2018 1521   GFRAA 36 (L) 02/29/2020 0000   GFRAA 40 (L) 11/27/2018 1521     Diabetic Labs (most recent): Lab Results  Component Value Date   HGBA1C 6.7 (H) 02/29/2020   HGBA1C 8.3 (H) 10/26/2019   HGBA1C 7.2 (A) 06/22/2019     Lipid Panel ( most recent) Lipid Panel     Component Value Date/Time   CHOL 84 (L) 02/29/2020 0000   TRIG 86 02/29/2020 0000   HDL 25 (L) 02/29/2020 0000   CHOLHDL 3.4 02/29/2020 0000   CHOLHDL 3.7 10/26/2019 1238   VLDL 14 10/26/2019 1238   LDLCALC 41 02/29/2020 0000        Assessment & Plan:   1) DM type 2 causing vascular disease   - Meggen Spaziani has currently uncontrolled symptomatic type 2 DM since 60 years of age.  She presents today with her meter, no logs, showing increasing glycemic profile over the last few weeks.  She says she has been out of her insulin for the last 3 weeks, despite me sending  in refill for her due to insurance.  Her previsit A1c was 6.9%, increasing  slightly from last visit of 6.7%.  She no longer takes the Trulicity due to constipation and does not take the Glipizide because of hypoglycemia.  She denies any recent hypoglycemia.  Analysis of her meter shows 7-day average of 219, 14-day average of 211, 30-day average of 162, and 90-day average of 143.  - I had a long discussion with her about the progressive nature of diabetes and the pathology behind its complications. -her diabetes is complicated by retinopathy, nephropathy, coronary artery disease and she remains at a high risk for more acute and chronic complications which include CAD, CVA, CKD, retinopathy, and neuropathy. These are all discussed in detail with her.  - Nutritional counseling repeated at each appointment due to patients tendency to fall back in to old habits.  - The patient admits there is a room for improvement in their diet and drink choices. -  Suggestion is made for the patient to avoid simple carbohydrates from their diet including Cakes, Sweet Desserts / Pastries, Ice Cream, Soda (diet and regular), Sweet Tea, Candies, Chips, Cookies, Sweet Pastries,  Store Bought Juices, Alcohol in Excess of  1-2 drinks a day, Artificial Sweeteners, Coffee Creamer, and "Sugar-free" Products. This will help patient to have stable blood glucose profile and potentially avoid unintended weight gain.   - I encouraged the patient to switch to  unprocessed or minimally processed complex starch and increased protein intake (animal or plant source), fruits, and vegetables.   - Patient is advised to stick to a routine mealtimes to eat 3 meals  a day and avoid unnecessary snacks ( to snack only to correct hypoglycemia).  - I have approached her with the following individualized plan to manage  her diabetes and patient agrees:   - she will benefit from simplification of her treatment regimen.  She will not need prandial insulin for now.    -She is advised to continue Levemir 50 units SQ nightly,  stop the Trulicity altogether, and restart Glipizide 5 mg po daily with breakfast.  All new scripts sent in for her and samples provided of Tresiba (no Levemir samples available).  -She is encouraged to continue monitoring blood glucose at least twice a day, before breakfast and before bed and report blood glucose less than 70 or above 300 for three tests in a row.  She did not do well with SGLT2 inhibitor Wilder Glade when it was prescribed by her cardiologist as it caused significant hypotension.  - she is warned not to take insulin without proper monitoring per orders.  - she is not a candidate for metformin, SGLT2 inhibitors due to concurrent renal insufficiency.  - Patient specific target  A1c;  LDL, HDL, Triglycerides, and  Waist Circumference were discussed in detail.  2) Blood Pressure /Hypertension:  -Her blood pressure is not controlled to target.  She is advised to continue Carvedilol 12.5 mg p.o. twice daily, Isosorbide mononitrate 30 mg p.o. daily, Entresto 97-103 mg p.o. twice daily, and Aldactone 25 mg p.o. daily.  3) Lipids/Hyperlipidemia:    Her most recent lipid panel from 02/29/20 shows controlled LDL at 41.  She is advised to continue Lipitor 80 mg po daily at bedtime.  Side effects and precautions discussed with her.  4)  Weight/Diet:  Her Body mass index is 34.82 kg/m.-   clearly complicating her diabetes care.  She is a candidate for continued, modest weight loss.  I discussed with her  the fact that loss of 5 - 10% of her  current body weight will have the most impact on her diabetes management.  CDE Consult will be initiated . Exercise, and detailed carbohydrates information provided  -  detailed on discharge instructions.  5) Chronic Care/Health Maintenance: -she is on ACE/ARB and statin medications and is encouraged to initiate and continue to follow up with Ophthalmology, Dentist,  Podiatrist at least yearly or according to recommendations, and advised to stay away from  smoking. I have recommended yearly flu vaccine and pneumonia vaccine at least every 5 years; moderate intensity exercise for up to 150 minutes weekly; and  sleep for at least 7 hours a day.  - she is advised to maintain close follow up with Margo Common, Jarvis Newcomer, MD for primary care needs, as well as her other providers for optimal and coordinated care.    I spent 40 minutes in the care of the patient today including review of labs from [place], Lipids, Thyroid Function, Hematology (current and previous including abstractions from other facilities); face-to-face time discussing  her blood glucose readings/logs, discussing hypoglycemia and hyperglycemia episodes and symptoms, medications doses, her options of short and long term treatment based on the latest standards of care / guidelines;  discussion about incorporating lifestyle medicine;  and documenting the encounter.    Please refer to Patient Instructions for Blood Glucose Monitoring and Insulin/Medications Dosing Guide"  in media tab for additional information. Please  also refer to " Patient Self Inventory" in the Media  tab for reviewed elements of pertinent patient history.  [name] participated in the discussions, expressed understanding, and voiced agreement with the above plans.  All questions were answered to her satisfaction. she is encouraged to contact clinic should she have any questions or concerns prior to her return visit.   Follow up plan: - No follow-ups on file.  Rayetta Pigg, [facility] [address] [place], [place] [address] Phone: [phone] Fax: [phone]   07/10/2020, 10:40 AM

## 2020-07-10 NOTE — Patient Instructions (Signed)

## 2020-08-12 ENCOUNTER — Telehealth: Payer: Self-pay | Admitting: Cardiology

## 2020-08-12 NOTE — Telephone Encounter (Signed)
Pt with chills and low BP,  On call she can hardly speak.  I asked her to call 911,  She may try to have someone to bring her to [facility] but I again said if feels this bad call 911.

## 2020-08-13 ENCOUNTER — Other Ambulatory Visit: Payer: Self-pay

## 2020-08-13 ENCOUNTER — Emergency Department (HOSPITAL_COMMUNITY): Payer: Medicare Other

## 2020-08-13 ENCOUNTER — Inpatient Hospital Stay (HOSPITAL_COMMUNITY)
Admission: EM | Admit: 2020-08-13 | Discharge: 2020-08-16 | DRG: 683 | Disposition: A | Discharge status: Home / Self Care | Payer: Medicare Other | Attending: Internal Medicine | Admitting: Internal Medicine

## 2020-08-13 ENCOUNTER — Telehealth: Payer: Self-pay | Admitting: Cardiology

## 2020-08-13 DIAGNOSIS — N184 Chronic kidney disease, stage 4 (severe): Secondary | ICD-10-CM | POA: Diagnosis present

## 2020-08-13 DIAGNOSIS — E860 Dehydration: Secondary | ICD-10-CM | POA: Diagnosis present

## 2020-08-13 DIAGNOSIS — Z6836 Body mass index (BMI) 36.0-36.9, adult: Secondary | ICD-10-CM | POA: Diagnosis not present

## 2020-08-13 DIAGNOSIS — R7401 Elevation of levels of liver transaminase levels: Secondary | ICD-10-CM | POA: Diagnosis present

## 2020-08-13 DIAGNOSIS — Z794 Long term (current) use of insulin: Secondary | ICD-10-CM | POA: Diagnosis not present

## 2020-08-13 DIAGNOSIS — Z87440 Personal history of urinary (tract) infections: Secondary | ICD-10-CM

## 2020-08-13 DIAGNOSIS — I252 Old myocardial infarction: Secondary | ICD-10-CM

## 2020-08-13 DIAGNOSIS — I130 Hypertensive heart and chronic kidney disease with heart failure and stage 1 through stage 4 chronic kidney disease, or unspecified chronic kidney disease: Secondary | ICD-10-CM | POA: Diagnosis present

## 2020-08-13 DIAGNOSIS — Z91199 Patient's noncompliance with other medical treatment and regimen due to unspecified reason: Secondary | ICD-10-CM

## 2020-08-13 DIAGNOSIS — Z823 Family history of stroke: Secondary | ICD-10-CM

## 2020-08-13 DIAGNOSIS — Z7401 Bed confinement status: Secondary | ICD-10-CM

## 2020-08-13 DIAGNOSIS — N1832 Chronic kidney disease, stage 3b: Secondary | ICD-10-CM

## 2020-08-13 DIAGNOSIS — Z20822 Contact with and (suspected) exposure to covid-19: Secondary | ICD-10-CM | POA: Diagnosis present

## 2020-08-13 DIAGNOSIS — I25110 Atherosclerotic heart disease of native coronary artery with unstable angina pectoris: Secondary | ICD-10-CM | POA: Diagnosis present

## 2020-08-13 DIAGNOSIS — E7800 Pure hypercholesterolemia, unspecified: Secondary | ICD-10-CM | POA: Diagnosis present

## 2020-08-13 DIAGNOSIS — Z79899 Other long term (current) drug therapy: Secondary | ICD-10-CM

## 2020-08-13 DIAGNOSIS — E1122 Type 2 diabetes mellitus with diabetic chronic kidney disease: Secondary | ICD-10-CM | POA: Diagnosis present

## 2020-08-13 DIAGNOSIS — Z833 Family history of diabetes mellitus: Secondary | ICD-10-CM

## 2020-08-13 DIAGNOSIS — R3121 Asymptomatic microscopic hematuria: Secondary | ICD-10-CM | POA: Diagnosis present

## 2020-08-13 DIAGNOSIS — E669 Obesity, unspecified: Secondary | ICD-10-CM | POA: Diagnosis present

## 2020-08-13 DIAGNOSIS — E785 Hyperlipidemia, unspecified: Secondary | ICD-10-CM | POA: Diagnosis present

## 2020-08-13 DIAGNOSIS — D61818 Other pancytopenia: Secondary | ICD-10-CM | POA: Diagnosis present

## 2020-08-13 DIAGNOSIS — N179 Acute kidney failure, unspecified: Principal | ICD-10-CM | POA: Diagnosis present

## 2020-08-13 DIAGNOSIS — Z8249 Family history of ischemic heart disease and other diseases of the circulatory system: Secondary | ICD-10-CM

## 2020-08-13 DIAGNOSIS — I5042 Chronic combined systolic (congestive) and diastolic (congestive) heart failure: Secondary | ICD-10-CM | POA: Diagnosis present

## 2020-08-13 DIAGNOSIS — D509 Iron deficiency anemia, unspecified: Secondary | ICD-10-CM | POA: Diagnosis present

## 2020-08-13 DIAGNOSIS — Z955 Presence of coronary angioplasty implant and graft: Secondary | ICD-10-CM

## 2020-08-13 DIAGNOSIS — I255 Ischemic cardiomyopathy: Secondary | ICD-10-CM | POA: Diagnosis present

## 2020-08-13 DIAGNOSIS — R8271 Bacteriuria: Secondary | ICD-10-CM | POA: Diagnosis present

## 2020-08-13 DIAGNOSIS — Z9119 Patient's noncompliance with other medical treatment and regimen: Secondary | ICD-10-CM

## 2020-08-13 DIAGNOSIS — K219 Gastro-esophageal reflux disease without esophagitis: Secondary | ICD-10-CM | POA: Diagnosis present

## 2020-08-13 DIAGNOSIS — E1140 Type 2 diabetes mellitus with diabetic neuropathy, unspecified: Secondary | ICD-10-CM | POA: Diagnosis present

## 2020-08-13 DIAGNOSIS — E119 Type 2 diabetes mellitus without complications: Secondary | ICD-10-CM

## 2020-08-13 DIAGNOSIS — I10 Essential (primary) hypertension: Secondary | ICD-10-CM | POA: Diagnosis present

## 2020-08-13 DIAGNOSIS — R197 Diarrhea, unspecified: Secondary | ICD-10-CM | POA: Diagnosis present

## 2020-08-13 DIAGNOSIS — I959 Hypotension, unspecified: Secondary | ICD-10-CM | POA: Diagnosis present

## 2020-08-13 DIAGNOSIS — Z7982 Long term (current) use of aspirin: Secondary | ICD-10-CM

## 2020-08-13 DIAGNOSIS — Z8711 Personal history of peptic ulcer disease: Secondary | ICD-10-CM

## 2020-08-13 LAB — CBC WITH DIFFERENTIAL/PLATELET
Abs Immature Granulocytes: 0.02 10*3/uL (ref 0.00–0.07)
Basophils Absolute: 0 10*3/uL (ref 0.0–0.1)
Basophils Relative: 1 %
Eosinophils Absolute: 0 10*3/uL (ref 0.0–0.5)
Eosinophils Relative: 0 %
HCT: 33.8 % — ABNORMAL LOW (ref 36.0–46.0)
Hemoglobin: 10.5 g/dL — ABNORMAL LOW (ref 12.0–15.0)
Immature Granulocytes: 1 %
Lymphocytes Relative: 27 %
Lymphs Abs: 0.7 10*3/uL (ref 0.7–4.0)
MCH: 28.1 pg (ref 26.0–34.0)
MCHC: 31.1 g/dL (ref 30.0–36.0)
MCV: 90.4 fL (ref 80.0–100.0)
Monocytes Absolute: 0.4 10*3/uL (ref 0.1–1.0)
Monocytes Relative: 15 %
Neutro Abs: 1.6 10*3/uL — ABNORMAL LOW (ref 1.7–7.7)
Neutrophils Relative %: 56 %
Platelets: 89 10*3/uL — ABNORMAL LOW (ref 150–400)
RBC: 3.74 MIL/uL — ABNORMAL LOW (ref 3.87–5.11)
RDW: 13.9 % (ref 11.5–15.5)
WBC Morphology: INCREASED
WBC: 2.8 10*3/uL — ABNORMAL LOW (ref 4.0–10.5)
nRBC: 0 % (ref 0.0–0.2)

## 2020-08-13 LAB — COMPREHENSIVE METABOLIC PANEL
ALT: 94 U/L — ABNORMAL HIGH (ref 0–44)
AST: 123 U/L — ABNORMAL HIGH (ref 15–41)
Albumin: 3.2 g/dL — ABNORMAL LOW (ref 3.5–5.0)
Alkaline Phosphatase: 73 U/L (ref 38–126)
Anion gap: 7 (ref 5–15)
BUN: 34 mg/dL — ABNORMAL HIGH (ref 8–23)
CO2: 22 mmol/L (ref 22–32)
Calcium: 8.7 mg/dL — ABNORMAL LOW (ref 8.9–10.3)
Chloride: 104 mmol/L (ref 98–111)
Creatinine, Ser: 3.28 mg/dL — ABNORMAL HIGH (ref 0.44–1.00)
GFR, Estimated: 15 mL/min — ABNORMAL LOW (ref >60)
Glucose, Bld: 96 mg/dL (ref 70–99)
Potassium: 4.1 mmol/L (ref 3.5–5.1)
Sodium: 133 mmol/L — ABNORMAL LOW (ref 135–145)
Total Bilirubin: 0.9 mg/dL (ref 0.3–1.2)
Total Protein: 6.7 g/dL (ref 6.5–8.1)

## 2020-08-13 LAB — HEPATITIS PANEL, ACUTE
HCV Ab: NONREACTIVE
Hep A IgM: NONREACTIVE
Hep B C IgM: NONREACTIVE
Hepatitis B Surface Ag: NONREACTIVE

## 2020-08-13 LAB — LIPASE, BLOOD: Lipase: 104 U/L — ABNORMAL HIGH (ref 11–51)

## 2020-08-13 MED ORDER — HEPARIN SODIUM (PORCINE) 5000 UNIT/ML IJ SOLN
5000.0000 [IU] | Freq: Three times a day (TID) | INTRAMUSCULAR | Status: DC
  Administered 2020-08-13 – 2020-08-16 (×8): 5000 [IU] via SUBCUTANEOUS
  Filled 2020-08-13 (×8): qty 1

## 2020-08-13 MED ORDER — DOCUSATE SODIUM 283 MG RE ENEM
1.0000 | ENEMA | RECTAL | Status: DC | PRN
  Filled 2020-08-13: qty 1

## 2020-08-13 MED ORDER — SODIUM CHLORIDE 0.9 % IV BOLUS
1000.0000 mL | Freq: Once | INTRAVENOUS | Status: AC
  Administered 2020-08-13: 1000 mL via INTRAVENOUS

## 2020-08-13 MED ORDER — CALCIUM CARBONATE ANTACID 1250 MG/5ML PO SUSP
500.0000 mg | Freq: Four times a day (QID) | ORAL | Status: DC | PRN
  Filled 2020-08-13: qty 5

## 2020-08-13 MED ORDER — SORBITOL 70 % SOLN
30.0000 mL | Status: DC | PRN
  Filled 2020-08-13: qty 30

## 2020-08-13 MED ORDER — ACETAMINOPHEN 650 MG RE SUPP: 650.0000 mg | Freq: Four times a day (QID) | RECTAL | Status: DC | PRN

## 2020-08-13 MED ORDER — ONDANSETRON HCL 4 MG/2ML IJ SOLN
4.0000 mg | Freq: Four times a day (QID) | INTRAMUSCULAR | Status: DC | PRN
Start: 2020-08-13 — End: 2020-08-16

## 2020-08-13 MED ORDER — NEPRO/CARBSTEADY PO LIQD
237.0000 mL | Freq: Three times a day (TID) | ORAL | Status: DC | PRN
  Filled 2020-08-13: qty 237

## 2020-08-13 MED ORDER — ZOLPIDEM TARTRATE 5 MG PO TABS: 5.0000 mg | ORAL_TABLET | Freq: Every evening | ORAL | Status: DC | PRN

## 2020-08-13 MED ORDER — STERILE WATER FOR INJECTION IV SOLN
INTRAVENOUS | Status: DC
  Filled 2020-08-13 (×2): qty 1000

## 2020-08-13 MED ORDER — HYDROXYZINE HCL 25 MG PO TABS: 25.0000 mg | ORAL_TABLET | Freq: Three times a day (TID) | ORAL | Status: DC | PRN

## 2020-08-13 MED ORDER — ONDANSETRON HCL 4 MG PO TABS
4.0000 mg | ORAL_TABLET | Freq: Four times a day (QID) | ORAL | Status: DC | PRN
Start: 2020-08-13 — End: 2020-08-16

## 2020-08-13 MED ORDER — ACETAMINOPHEN 325 MG PO TABS: 650.0000 mg | ORAL_TABLET | Freq: Four times a day (QID) | ORAL | Status: DC | PRN

## 2020-08-13 MED ORDER — CAMPHOR-MENTHOL 0.5-0.5 % EX LOTN: 1.0000 "application " | TOPICAL_LOTION | Freq: Three times a day (TID) | CUTANEOUS | Status: DC | PRN

## 2020-08-13 MED ORDER — INSULIN ASPART 100 UNIT/ML IJ SOLN: 0.0000 [IU] | Freq: Every day | INTRAMUSCULAR | Status: DC

## 2020-08-13 MED ORDER — INSULIN ASPART 100 UNIT/ML IJ SOLN
0.0000 [IU] | Freq: Three times a day (TID) | INTRAMUSCULAR | Status: DC
  Administered 2020-08-14: 2 [IU] via SUBCUTANEOUS
  Administered 2020-08-14 – 2020-08-15 (×2): 1 [IU] via SUBCUTANEOUS
  Administered 2020-08-16: 2 [IU] via SUBCUTANEOUS

## 2020-08-13 NOTE — Telephone Encounter (Signed)
Pt c/o BP issue: STAT if pt c/o blurred vision, one-sided weakness or slurred speech  1. What are your last 5 BP readings? Low blood pressure 85/57, 83/57  2. Are you having any other symptoms (ex. Dizziness, headache, blurred vision, passed out)? Blurred vision- heart feels like it is beating slow  3. What is your BP issue? Low blood pressure- wants to be seen today

## 2020-08-13 NOTE — Telephone Encounter (Signed)
The patient called in with low blood pressures stating that for the last 5 days her blood pressures have been in the low 80/50's. This morning it was 83/57 and heart rate was 85. She has not taken her medications in two days. She denied a fever.  She called in yesterday and was advised to go to the ED. She went to [facility] but was sent back home with advisement to get a hepatitis test.  The patient has been advised to go to the ED, preferably [facility]. She was advised to call 911 since she was lethargic, short of breath and dizzy.

## 2020-08-13 NOTE — ED Triage Notes (Signed)
Pt reports one week of intermittent hypotension, leg cramping, and dizziness. Reports she has been drinking salt water and eating icy pops to help with the symptoms. Sts she went to [facility] for same ysterday and was told she needs to have a hepatitis test.

## 2020-08-13 NOTE — H&P (Signed)
History and Physical   Stephanie Manning WIO:[id number] DOB: 1961-01-30 DOA: 08/13/2020  Referring MD/NP/PA: Dr. Dina Rich  PCP: [name], MD   Outpatient Specialists: None  Patient coming from: Home  Chief Complaint: Dizziness hypertension weakness  HPI: Stephanie Manning is a 60 y.o. female with medical history significant of essential hypertension, coronary artery disease, diabetes, chronic kidney disease stage I, hyperlipidemia among other things who has had decreased oral intake in the last few days because she is sick.  Patient came to the ER complaining of dizziness unsteady on her feet.  In the last week she has been bedbound for the most part.  She felt dehydrated.  Patient had an episode of diarrhea but that has stopped.  No nausea or vomiting.  Blood pressure at home was in the 60s to 70s.  Patient came to the ER and was found to be orthostatic.  Also worsening renal function.  Her kidney function was notably stable prior to this.  Patient therefore being admitted to the hospital for evaluation and treatment with acute kidney injury..  ED Course: Temperature 98.7 blood pressure 96/54, pulse 93 respiratory 24 oxygen sat 98% on room air.  White count is 2.8 hemoglobin 10.5 platelets 89.  Sodium 133 potassium 4.0 chloride 104 CO2 22 BUN 35 creatinine 3.28 and calcium 8.7.  Serology for hepatitis was negative COVID-19 screen also negative.  Patient being admitted with acute kidney injury on chronic kidney disease stage I, pancytopenia, recent illness.  Review of Systems: As per HPI otherwise 10 point review of systems negative.    Past Medical History:  Diagnosis Date  . AKI (acute kidney injury) ([place]) 02/2017  . CAD in native artery    a. NSTEMI 02/2017 s/p DES to mLAD, diffuse disease otherwise, EF 30-35%. b. Staged cath 03/2017 - patent stent in the proximal to mid LAD. CTO of the distal LAD, 90% diffuse small first diagonal, 90% distal LCx/OM, 80% diffuse prox RCA,  moderately elevated LVEDP 33mHg, s/p DES to mRCA, residual disease treated medically (too small for PCI), EF 30-35%.  . Chronic combined systolic and diastolic CHF (congestive heart failure) ([facility]   . CKD (chronic kidney disease), stage IV ([facility]    /[name]2/09/2017  . Diabetes mellitus, type II, insulin dependent ([facility]   . GERD (gastroesophageal reflux disease)   . History of blood transfusion    "related to menses"  . History of stomach ulcers 2017  . Hyperlipidemia   . Hypertension   . Iron deficiency anemia 03/2017   "had to have iron infusions" (10/10/2017)  . Ischemic cardiomyopathy   . STEMI (ST elevation myocardial infarction) ([facility] 02/2017    Past Surgical History:  Procedure Laterality Date  . CESAREAN SECTION  1987  . CORONARY ANGIOPLASTY WITH STENT PLACEMENT  04/01/2017   drug-eluting stent was successfully placed using a STENT SYNERGY DES 23.84T36 . CORONARY STENT INTERVENTION N/A 04/01/2017   Procedure: CORONARY STENT INTERVENTION;  Surgeon: JMartinique [name], MD;  [facility];  Service: Cardiovascular;  Laterality: N/A;  . CORONARY/GRAFT ACUTE MI REVASCULARIZATION N/A 03/10/2017   Procedure: Coronary/Graft Acute MI Revascularization;  Surgeon: JMartinique [name], MD;  [facility];  Service: Cardiovascular;  Laterality: N/A;  . LEFT HEART CATH AND CORONARY ANGIOGRAPHY N/A 03/10/2017   Procedure: LEFT HEART CATH AND CORONARY ANGIOGRAPHY;  Surgeon: JMartinique [name], MD;  [facility];  Service: Cardiovascular;  Laterality: N/A;  . LEFT HEART CATH  AND CORONARY ANGIOGRAPHY N/A 04/01/2017   Procedure: LEFT HEART CATH AND CORONARY ANGIOGRAPHY;  Surgeon: [name], [name], MD;  Location: [facility];  Service: Cardiovascular;  Laterality: N/A;  . LEFT HEART CATH AND CORONARY ANGIOGRAPHY N/A 10/12/2017   Procedure: LEFT HEART CATH AND CORONARY ANGIOGRAPHY;  Surgeon: Sherren Mocha, MD;  Location: [facility];  Service:  Cardiovascular;  Laterality: N/A;  . OOPHORECTOMY     "1 ovary removed"  . THROAT SURGERY  02/2014   "nodule in my throat removed; not related to thyroid"     reports that she has never smoked. She has never used smokeless tobacco. She reports that she does not drink alcohol and does not use drugs.  Allergies  Allergen Reactions  . Hydralazine Other (See Comments)    Syncope due to hypotension  . Bidil [Isosorb Dinitrate-Hydralazine]   . Ciprofloxacin   . Farxiga [Dapagliflozin]   . Propoxycaine   . Amoxicillin Other (See Comments)    Yeast infection during treatment  . Hydrocodone Palpitations, Rash and Other (See Comments)    GI upset, also  . Lisinopril Palpitations and Other (See Comments)    Chest pain, too  . Meloxicam Nausea Only  . Strawberry Extract Hives    Does not always happen  . Tape Rash    Some tapes cause rashes  . Toradol [Ketorolac Tromethamine] Palpitations    Family History  Problem Relation Age of Onset  . Hypertension Mother   . Heart disease Mother   . Diabetes Mother   . Stroke Mother      Prior to Admission medications   Medication Sig Start Date End Date Taking? Authorizing Provider  Accu-Chek Softclix Lancets lancets Use as instructed 12/08/18   [name], MD  aspirin 81 MG chewable tablet Chew 1 tablet (81 mg total) by mouth daily. 03/17/17   Cheryln Manly, NP  atorvastatin (LIPITOR) 80 MG tablet Take 1 tablet (80 mg total) by mouth daily at 6 PM. 05/21/20   Brita Romp, NP  blood glucose meter kit and supplies Dispense based on patient and insurance preference. Use up to four times daily as directed. (FOR ICD-10 E11.65) Pt prefers One touch mini 11/09/18   [name], MD  carvedilol (COREG) 12.5 MG tablet Take 1 tablet (12.5 mg total) by mouth 2 (two) times daily with a meal. 11/28/19   [name], [name], MD  ELDERBERRY PO Take 2 tablets by mouth every other day.     [provider]  ferrous sulfate 300  (60 Fe) MG/5ML syrup Take 300 mg by mouth daily with breakfast.    [provider]  gabapentin (NEURONTIN) 300 MG capsule Take 300 mg by mouth at bedtime.  02/08/17   [provider]  glipiZIDE (GLUCOTROL) 5 MG tablet Take 1 tablet (5 mg total) by mouth daily before breakfast. 07/10/20   Brita Romp, NP  glucose blood (ACCU-CHEK GUIDE) test strip TEST BLOOD SUGAR FOUR TIMES A DAY BEFORE MEALS AND AT BEDTIME 01/07/20   Nida, Marella Chimes, MD  insulin detemir (LEVEMIR FLEXTOUCH) 100 UNIT/ML FlexPen Inject 50 Units into the skin at bedtime. 07/10/20   Brita Romp, NP  Multiple Vitamin (MULTIVITAMIN WITH MINERALS) TABS tablet Take 1 tablet by mouth daily.    [provider]  nitroGLYCERIN (NITROSTAT) 0.4 MG SL tablet Place 1 tablet (0.4 mg total) under the tongue every 5 (five) minutes as needed for chest pain. 11/28/19   [name], Peter  M, MD  pantoprazole (PROTONIX) 40 MG tablet Take 1 tablet (40 mg total) by mouth 2 (two) times daily. 11/28/19   [name], [name], MD  sacubitril-valsartan (ENTRESTO) 97-103 MG Take 1 tablet by mouth 2 (two) times daily. 11/28/19   [name], [name], MD  Saline 0.2 % SOLN Place 1 spray into both nostrils as needed (congestion).     [provider]  spironolactone (ALDACTONE) 25 MG tablet Take 1 tablet (25 mg total) by mouth daily. 06/03/20   [name], MD  ticagrelor (BRILINTA) 60 MG TABS tablet Take 1 tablet (60 mg total) by mouth 2 (two) times daily. 06/27/20   [name], [name], MD  tiZANidine (ZANAFLEX) 4 MG tablet Take 4 mg by mouth at bedtime. 02/08/17   [provider]  VITAMIN D PO Take 1 tablet by mouth 2 (two) times a week. Mondays and Wednesdays    [provider]    Physical Exam: Vitals:   08/13/20 1830 08/13/20 1835 08/13/20 1845 08/13/20 1915  BP: 110/68  (!) 96/54 (!) 106/55  Pulse: 88  93 93  Resp: (!) '25  17 15  ' Temp:      TempSrc:      SpO2: 99%  98% 100%  Weight:  105.2 kg     Height:  '5\' 8"'  (1.727 m)        Constitutional: Morbidly obese, acutely ill looking no distress Vitals:   08/13/20 1830 08/13/20 1835 08/13/20 1845 08/13/20 1915  BP: 110/68  (!) 96/54 (!) 106/55  Pulse: 88  93 93  Resp: (!) '25  17 15  ' Temp:      TempSrc:      SpO2: 99%  98% 100%  Weight:  105.2 kg    Height:  '5\' 8"'  (1.727 m)     Eyes: PERRL, lids and conjunctivae normal ENMT: Mucous membranes are dry. Posterior pharynx clear of any exudate or lesions.Normal dentition.  Neck: normal, supple, no masses, no thyromegaly Respiratory: clear to auscultation bilaterally, no wheezing, no crackles. Normal respiratory effort. No accessory muscle use.  Cardiovascular:  sinus tachycardia, no murmurs / rubs / gallops. No extremity edema. 2+ pedal pulses. No carotid bruits.  Abdomen: no tenderness, no masses palpated. No hepatosplenomegaly. Bowel sounds positive.  Musculoskeletal: no clubbing / cyanosis. No joint deformity upper and lower extremities. Good ROM, no contractures. Normal muscle tone.  Skin: no rashes, lesions, ulcers. No induration Neurologic: CN 2-12 grossly intact. Sensation intact, DTR normal. Strength 5/5 in all 4.  Psychiatric: Normal judgment and insight. Alert and oriented x 3.  Anxious mood.     Labs on Admission: I have personally reviewed following labs and imaging studies  CBC: Recent Labs  Lab 08/13/20 1613  WBC 2.8*  NEUTROABS 1.6*  HGB 10.5*  HCT 33.8*  MCV 90.4  PLT 89*   Basic Metabolic Panel: Recent Labs  Lab 08/13/20 1613  NA 133*  K 4.1  CL 104  CO2 22  GLUCOSE 96  BUN 34*  CREATININE 3.28*  CALCIUM 8.7*   GFR: Estimated Creatinine Clearance: 22.9 mL/min (A) (by C-G formula based on SCr of 3.28 mg/dL (H)). Liver Function Tests: Recent Labs  Lab 08/13/20 1613  AST 123*  ALT 94*  ALKPHOS 73  BILITOT 0.9  PROT 6.7  ALBUMIN 3.2*   Recent Labs  Lab 08/13/20 1613  LIPASE 104*   No results for input(s): AMMONIA in the last 168  hours. Coagulation Profile: No results for input(s): INR, PROTIME in  the last 168 hours. Cardiac Enzymes: No results for input(s): CKTOTAL, CKMB, CKMBINDEX, TROPONINI in the last 168 hours. BNP (last 3 results) No results for input(s): PROBNP in the last 8760 hours. HbA1C: No results for input(s): HGBA1C in the last 72 hours. CBG: No results for input(s): GLUCAP in the last 168 hours. Lipid Profile: No results for input(s): CHOL, HDL, LDLCALC, TRIG, CHOLHDL, LDLDIRECT in the last 72 hours. Thyroid Function Tests: No results for input(s): TSH, T4TOTAL, FREET4, T3FREE, THYROIDAB in the last 72 hours. Anemia Panel: No results for input(s): VITAMINB12, FOLATE, FERRITIN, TIBC, IRON, RETICCTPCT in the last 72 hours. Urine analysis: No results found for: COLORURINE, APPEARANCEUR, LABSPEC, PHURINE, GLUCOSEU, HGBUR, BILIRUBINUR, KETONESUR, PROTEINUR, UROBILINOGEN, NITRITE, LEUKOCYTESUR Sepsis Labs: '@LABRCNTIP' (procalcitonin:4,lacticidven:4) )No results found for this or any previous visit (from the past 240 hour(s)).   Radiological Exams on Admission: DG Chest Portable 1 View  Result Date: 08/13/2020 CLINICAL DATA:  Hypotension and dizziness EXAM: PORTABLE CHEST 1 VIEW COMPARISON:  Aug 12, 2020 FINDINGS: The lungs are clear. The heart size and pulmonary vascularity are normal. No adenopathy. No bone lesions. IMPRESSION: Lungs clear.  Cardiac silhouette normal. Electronically Signed   By: Lowella Grip III M.D.   On: 08/13/2020 16:36    EKG: Independently reviewed.  She shows sinus rhythm with T wave inversions in the lateral leads.  No change from previous EKG.  Assessment/Plan Principal Problem:   AKI (acute kidney injury) ([place]) Active Problems:   DM (diabetes mellitus), type 2 ([place])   Essential hypertension   Coronary artery disease involving native coronary artery of native heart with unstable angina pectoris (HCC)   CKD (chronic kidney disease) stage 4, GFR 15-29 ml/min (HCC)    Pure hypercholesterolemia   Type 2 diabetes mellitus with stage 3b chronic kidney disease, with long-term current use of insulin (HCC)   GERD (gastroesophageal reflux disease)   Noncompliance   Obesity (BMI 30-39.9)     #1 acute kidney injury on chronic kidney disease stage I: Patient will be admitted and aggressively hydrated.  Symptoms appear to be due to severe dehydration from poor oral intake.  If no significant improvement in the morning we will get renal ultrasound to rule out obstructive uropathy.  Continue close monitoring.  She has diabetic neuropathy and nephropathy.  #2 pancytopenia: Cause not clear.  Acute illness may be responsible.  Could be some viral illness.  If no rebound will need hematology consult.  Some bone marrow evaluation may be necessary.  Not a known alcoholic.  No known history of liver cirrhosis.  Continue to monitor CBC.  #3 diabetes: Sliding scale insulin.  Continue Levemir and glipizide.  #4 essential hypertension: On carvedilol.  Also Aldactone.  Continue except Aldactone.  #5 congestive heart failure: Last known EF is 40 to 45%.  Patient on diuretics including Aldactone.  Also on Entresto.  In the setting of acute kidney injury will hold these drugs.  #6 morbid obesity: Dietary counseling  #7 GERD: Continue with PPIs.  DVT prophylaxis: Subcu heparin Code Status: Full code Family Communication: Cousin at bedside Disposition Plan: Home Consults called: None Admission status: Inpatient  Severity of Illness: The appropriate patient status for this patient is INPATIENT. Inpatient status is judged to be reasonable and necessary in order to provide the required intensity of service to ensure the patient's safety. The patient's presenting symptoms, physical exam findings, and initial radiographic and laboratory data in the context of their chronic comorbidities is felt to place them at high risk  for further clinical deterioration. Furthermore, it is not  anticipated that the patient will be medically stable for discharge from the hospital within 2 midnights of admission. The following factors support the patient status of inpatient.   " The patient's presenting symptoms include dizziness and weakness. " The worrisome physical exam findings include orthostasis. " The initial radiographic and laboratory data are worrisome because of acute on chronic kidney injury. " The chronic co-morbidities include diabetes with chronic kidney disease.   * I certify that at the point of admission it is my clinical judgment that the patient will require inpatient hospital care spanning beyond 2 midnights from the point of admission due to high intensity of service, high risk for further deterioration and high frequency of surveillance required.Barbette Merino MD [facility] Pager [phone]  If 7PM-7AM, please contact night-coverage [email or web address] Password [email or web address]  08/13/2020, 9:02 PM

## 2020-08-13 NOTE — ED Provider Notes (Signed)
Emergency Medicine Provider Triage Evaluation Note  Bijal Siglin , a 60 y.o. female  was evaluated in triage.  Pt complains of weakness.  Review of Systems  Positive: Weakness, leg cramping, dizzy Negative: Fever, cough, dysuria  Physical Exam  BP (!) 108/95 (BP Location: Left Arm)   Pulse 84   Temp (!) 97.3 F (36.3 C) (Oral)   Resp 18   SpO2 100%  Gen:   Awake, drowsy, slow to answer questions Resp:  Normal effort  MSK:   Moves extremities without difficulty  Other:  Globally weak  Medical Decision Making  Medically screening exam initiated at 4:14 PM.  Appropriate orders placed.  [name] was informed that the remainder of the evaluation will be completed by another provider, this initial triage assessment does not replace that evaluation, and the importance of remaining in the ED until their evaluation is complete.  Report having low blood pressure, weakness, and having leg cramping for the past week.     Domenic Moras, PA-C 08/13/20 1615    Lucrezia Starch, MD 08/14/20 [phone]

## 2020-08-13 NOTE — ED Provider Notes (Signed)
[facility] EMERGENCY DEPARTMENT Provider Note   CSN: [id number] Arrival date & time: 08/13/20  1543     History No chief complaint on file.   Stephanie Manning is a 60 y.o. female.  HPI   60 year old female past medical history of HTN, HLD, CAD, DM, CKD stage I presents the emergency department concern for hypotension, dizziness and weakness and decreased p.o. intake.  Patient states this is been going on for about the last week.  She has been so dizzy and unsteady on her feet that she is pretty much been bedbound.  She has not been able to eat or drink and feels dehydrated.  She had an episode of diarrhea but denies any ongoing fever, vomiting, diarrhea.  She otherwise been compliant with her medications.  Cousin is at bedside and states that when they take her blood pressure it is in the 60s to 70s on the top number.  Past Medical History:  Diagnosis Date  . AKI (acute kidney injury) ([place]) 02/2017  . CAD in native artery    a. NSTEMI 02/2017 s/p DES to mLAD, diffuse disease otherwise, EF 30-35%. b. Staged cath 03/2017 - patent stent in the proximal to mid LAD. CTO of the distal LAD, 90% diffuse small first diagonal, 90% distal LCx/OM, 80% diffuse prox RCA, moderately elevated LVEDP 74mHg, s/p DES to mRCA, residual disease treated medically (too small for PCI), EF 30-35%.  . Chronic combined systolic and diastolic CHF (congestive heart failure) ([facility]   . CKD (chronic kidney disease), stage IV ([facility]    /[name]2/09/2017  . Diabetes mellitus, type II, insulin dependent ([facility]   . GERD (gastroesophageal reflux disease)   . History of blood transfusion    "related to menses"  . History of stomach ulcers 2017  . Hyperlipidemia   . Hypertension   . Iron deficiency anemia 03/2017   "had to have iron infusions" (10/10/2017)  . Ischemic cardiomyopathy   . STEMI (ST elevation myocardial infarction) ([facility] 02/2017    Patient Active Problem List   Diagnosis Date Noted  .  AKI (acute kidney injury) ([facility] 08/13/2020  . COVID-19 vaccination declined 01/29/2020  . High priority for COVID-19 vaccination 01/29/2020  . GERD (gastroesophageal reflux disease) 01/16/2020  . Pure hypercholesterolemia 09/04/2019  . Type 2 diabetes mellitus with stage 3b chronic kidney disease, with long-term current use of insulin ([facility] 06/22/2019  . Iron deficiency anemia due to chronic blood loss 05/08/2019  . Chest pain, neg troponins and resolution of symptoms 01/14/2019  . Chronic combined systolic and diastolic heart failure ([place] [facility]   . Unstable angina ([facility] 10/10/2017  . Disc disease, degenerative, thoracic or thoracolumbar 05/17/2017  . DISH (diffuse idiopathic skeletal hyperostosis) 05/17/2017  . CAD in native artery 04/03/2017  . CKD (chronic kidney disease) stage 4, GFR 15-29 ml/min (HCC) 04/03/2017  . STEMI (ST elevation myocardial infarction) ([facility] 03/21/2017  . At risk for sudden cardiac death 0January 03, 2019 . Acute renal failure d/t procedure 03/14/2017  . Ischemic cardiomyopathy 03/14/2017  . Acute on chronic systolic congestive heart failure ([facility] 03/14/2017  . Coronary artery disease involving native coronary artery of native heart with unstable angina pectoris ([place] 03/11/2017  . Persistent cough 03/11/2017  . Nausea 03/11/2017  . Acute ST elevation myocardial infarction (STEMI) involving left anterior descending (LAD) coronary artery ([facility] 03/10/2017  . Anemia 03/10/2017  . Grade II hemorrhoids 12/30/2015  . 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 Vitamin D deficiency 04/20/2013  . Abnormal electrocardiogram 03/20/2013  .  DOE (dyspnea on exertion) 03/20/2013  . Palpitations 03/20/2013  . Noncompliance 02/21/2013  . Essential hypertension 08/23/2012  . Obesity (BMI 30-39.9) 08/23/2012  . Hyperlipidemia with target LDL less than 100 07/26/2012  . DDD (degenerative disc disease), thoracolumbar 07/26/2012  . DM (diabetes mellitus), type 2 ([place]) 07/05/2012    Past Surgical  History:  Procedure Laterality Date  . CESAREAN SECTION  1987  . CORONARY ANGIOPLASTY WITH STENT PLACEMENT  04/01/2017   drug-eluting stent was successfully placed using a STENT SYNERGY DES [id number]  . CORONARY STENT INTERVENTION N/A 04/01/2017   Procedure: CORONARY STENT INTERVENTION;  Surgeon: [name], [name], MD;  Location: [facility];  Service: Cardiovascular;  Laterality: N/A;  . CORONARY/GRAFT ACUTE MI REVASCULARIZATION N/A 03/10/2017   Procedure: Coronary/Graft Acute MI Revascularization;  Surgeon: [name], [name], MD;  Location: [facility];  Service: Cardiovascular;  Laterality: N/A;  . LEFT HEART CATH AND CORONARY ANGIOGRAPHY N/A 03/10/2017   Procedure: LEFT HEART CATH AND CORONARY ANGIOGRAPHY;  Surgeon: [name], [name], MD;  Location: [facility];  Service: Cardiovascular;  Laterality: N/A;  . LEFT HEART CATH AND CORONARY ANGIOGRAPHY N/A 04/01/2017   Procedure: LEFT HEART CATH AND CORONARY ANGIOGRAPHY;  Surgeon: [name], [name], MD;  Location: [facility];  Service: Cardiovascular;  Laterality: N/A;  . LEFT HEART CATH AND CORONARY ANGIOGRAPHY N/A 10/12/2017   Procedure: LEFT HEART CATH AND CORONARY ANGIOGRAPHY;  Surgeon: Sherren Mocha, MD;  Location: [facility];  Service: Cardiovascular;  Laterality: N/A;  . OOPHORECTOMY     "1 ovary removed"  . THROAT SURGERY  02/2014   "nodule in my throat removed; not related to thyroid"     OB History   No obstetric history on file.     Family History  Problem Relation Age of Onset  . Hypertension Mother   . Heart disease Mother   . Diabetes Mother   . Stroke Mother     Social History   Tobacco Use  . Smoking status: Never Smoker  . Smokeless tobacco: Never Used  Vaping Use  . Vaping Use: Never used  Substance Use Topics  . Alcohol use: Never  . Drug use: Never    Home Medications Prior to Admission medications   Medication Sig Start Date End Date Taking? Authorizing Provider   Accu-Chek Softclix Lancets lancets Use as instructed 12/08/18   [name], MD  aspirin 81 MG chewable tablet Chew 1 tablet (81 mg total) by mouth daily. 03/17/17   Cheryln Manly, NP  atorvastatin (LIPITOR) 80 MG tablet Take 1 tablet (80 mg total) by mouth daily at 6 PM. 05/21/20   Brita Romp, NP  blood glucose meter kit and supplies Dispense based on patient and insurance preference. Use up to four times daily as directed. (FOR ICD-10 E11.65) Pt prefers One touch mini 11/09/18   [name], MD  carvedilol (COREG) 12.5 MG tablet Take 1 tablet (12.5 mg total) by mouth 2 (two) times daily with a meal. 11/28/19   [name], [name], MD  ELDERBERRY PO Take 2 tablets by mouth every other day.     [provider]  ferrous sulfate 300 (60 Fe) MG/5ML syrup Take 300 mg by mouth daily with breakfast.    [provider]  gabapentin (NEURONTIN) 300 MG capsule Take 300 mg by mouth at bedtime.  02/08/17   [provider]  glipiZIDE (GLUCOTROL) 5 MG tablet Take 1 tablet (5 mg  total) by mouth daily before breakfast. 07/10/20   Brita Romp, NP  glucose blood (ACCU-CHEK GUIDE) test strip TEST BLOOD SUGAR FOUR TIMES A DAY BEFORE MEALS AND AT BEDTIME 01/07/20   Nida, Marella Chimes, MD  insulin detemir (LEVEMIR FLEXTOUCH) 100 UNIT/ML FlexPen Inject 50 Units into the skin at bedtime. 07/10/20   Brita Romp, NP  Multiple Vitamin (MULTIVITAMIN WITH MINERALS) TABS tablet Take 1 tablet by mouth daily.    [provider]  nitroGLYCERIN (NITROSTAT) 0.4 MG SL tablet Place 1 tablet (0.4 mg total) under the tongue every 5 (five) minutes as needed for chest pain. 11/28/19   [name], [name], MD  pantoprazole (PROTONIX) 40 MG tablet Take 1 tablet (40 mg total) by mouth 2 (two) times daily. 11/28/19   [name], [name], MD  sacubitril-valsartan (ENTRESTO) 97-103 MG Take 1 tablet by mouth 2 (two) times daily. 11/28/19   [name], [name], MD  Saline 0.2 % SOLN Place 1  spray into both nostrils as needed (congestion).     [provider]  spironolactone (ALDACTONE) 25 MG tablet Take 1 tablet (25 mg total) by mouth daily. 06/03/20   [name], MD  ticagrelor (BRILINTA) 60 MG TABS tablet Take 1 tablet (60 mg total) by mouth 2 (two) times daily. 06/27/20   [name], [name], MD  tiZANidine (ZANAFLEX) 4 MG tablet Take 4 mg by mouth at bedtime. 02/08/17   [provider]  VITAMIN D PO Take 1 tablet by mouth 2 (two) times a week. Mondays and Wednesdays    [provider]    Allergies    Hydralazine, Bidil [isosorb dinitrate-hydralazine], Ciprofloxacin, Farxiga [dapagliflozin], Propoxycaine, Amoxicillin, Hydrocodone, Lisinopril, Meloxicam, Strawberry extract, Tape, and Toradol [ketorolac tromethamine]  Review of Systems   Review of Systems  Constitutional: Positive for appetite change and fatigue. Negative for chills and fever.  HENT: Negative for congestion.   Eyes: Negative for visual disturbance.  Respiratory: Negative for shortness of breath.   Cardiovascular: Negative for chest pain.  Gastrointestinal: Negative for abdominal pain, diarrhea and vomiting.  Genitourinary: Negative for dysuria.  Skin: Negative for rash.  Neurological: Positive for dizziness and light-headedness. Negative for headaches.    Physical Exam Updated Vital Signs BP (!) 106/55   Pulse 93   Temp 98.7 F (37.1 C) (Oral)   Resp 15   Ht '5\' 8"'  (1.727 m)   Wt 105.2 kg   SpO2 100%   BMI 35.28 kg/m   Physical Exam Vitals and nursing note reviewed.  Constitutional:      Comments: Fatigued appearing  HENT:     Head: Normocephalic.     Mouth/Throat:     Mouth: Mucous membranes are moist.  Cardiovascular:     Rate and Rhythm: Normal rate.  Pulmonary:     Effort: Pulmonary effort is normal. No respiratory distress.  Abdominal:     Palpations: Abdomen is soft.     Tenderness: There is no abdominal tenderness.  Musculoskeletal:        General: No  swelling.  Skin:    General: Skin is warm.  Neurological:     Mental Status: She is alert and oriented to person, place, and time. Mental status is at baseline.  Psychiatric:        Mood and Affect: Mood normal.     ED Results / Procedures / Treatments   Labs (all labs ordered are listed, but only abnormal results are displayed) Labs Reviewed  CBC WITH DIFFERENTIAL/PLATELET - Abnormal;  Notable for the following components:      Result Value   WBC 2.8 (*)    RBC 3.74 (*)    Hemoglobin 10.5 (*)    HCT 33.8 (*)    Platelets 89 (*)    Neutro Abs 1.6 (*)    All other components within normal limits  COMPREHENSIVE METABOLIC PANEL - Abnormal; Notable for the following components:   Sodium 133 (*)    BUN 34 (*)    Creatinine, Ser 3.28 (*)    Calcium 8.7 (*)    Albumin 3.2 (*)    AST 123 (*)    ALT 94 (*)    GFR, Estimated 15 (*)    All other components within normal limits  LIPASE, BLOOD - Abnormal; Notable for the following components:   Lipase 104 (*)    All other components within normal limits  SARS CORONAVIRUS 2 (TAT 6-24 HRS)  HEPATITIS PANEL, ACUTE  URINALYSIS, ROUTINE W REFLEX MICROSCOPIC    EKG EKG Interpretation  Date/Time:  Wednesday August 13 2020 16:20:25 EDT Ventricular Rate:  73 PR Interval:  144 QRS Duration: 78 QT Interval:  390 QTC Calculation: 429 R Axis:   -4 Text Interpretation: Normal sinus rhythm T wave abnormality, consider inferior ischemia T wave abnormality, consider anterolateral ischemia Abnormal ECG Similar to previous Confirmed by Lavenia Atlas [phone]) on 08/13/2020 7:07:17 PM   Radiology DG Chest Portable 1 View  Result Date: 08/13/2020 CLINICAL DATA:  Hypotension and dizziness EXAM: PORTABLE CHEST 1 VIEW COMPARISON:  Aug 12, 2020 FINDINGS: The lungs are clear. The heart size and pulmonary vascularity are normal. No adenopathy. No bone lesions. IMPRESSION: Lungs clear.  Cardiac silhouette normal. Electronically Signed   By: Lowella Grip III M.D.   On: 08/13/2020 16:36    Procedures Procedures   Medications Ordered in ED Medications  sodium chloride 0.9 % bolus 1,000 mL (1,000 mLs Intravenous New Bag/Given 08/13/20 1958)    ED Course  I have reviewed the triage vital signs and the nursing notes.  Pertinent labs & imaging results that were available during my care of the patient were reviewed by me and considered in my medical decision making (see chart for details).    MDM Rules/Calculators/A&P                          60 year old female presents emergency department concern for hypotension and generalized unwell feeling.  Blood pressure is on the lower side at rest, when she stands up her blood pressure does drop acutely, she is orthostatic.  Blood work shows baseline anemia, worsening kidney dysfunction and new renal failure with a creatinine of 3.2.  Patient admits that she has had minimal to eat and drink the past couple days because of her dizziness with standing.  Denies any other fever or acute illness.  Patient is being hydrated, plan for admission.  Patients evaluation and results requires admission for further treatment and care. Patient agrees with admission plan, offers no new complaints and is stable/unchanged at time of admit.  Final Clinical Impression(s) / ED Diagnoses Final diagnoses:  None    Rx / DC Orders ED Discharge Orders    None       Lorelle Gibbs, DO 08/13/20 2126

## 2020-08-14 LAB — CBC WITH DIFFERENTIAL/PLATELET
Abs Immature Granulocytes: 0.02 10*3/uL (ref 0.00–0.07)
Basophils Absolute: 0 10*3/uL (ref 0.0–0.1)
Basophils Relative: 0 %
Eosinophils Absolute: 0 10*3/uL (ref 0.0–0.5)
Eosinophils Relative: 0 %
HCT: 29.2 % — ABNORMAL LOW (ref 36.0–46.0)
Hemoglobin: 9.2 g/dL — ABNORMAL LOW (ref 12.0–15.0)
Immature Granulocytes: 1 %
Lymphocytes Relative: 32 %
Lymphs Abs: 0.8 10*3/uL (ref 0.7–4.0)
MCH: 28.5 pg (ref 26.0–34.0)
MCHC: 31.5 g/dL (ref 30.0–36.0)
MCV: 90.4 fL (ref 80.0–100.0)
Monocytes Absolute: 0.4 10*3/uL (ref 0.1–1.0)
Monocytes Relative: 16 %
Neutro Abs: 1.2 10*3/uL — ABNORMAL LOW (ref 1.7–7.7)
Neutrophils Relative %: 51 %
Platelets: 81 10*3/uL — ABNORMAL LOW (ref 150–400)
RBC: 3.23 MIL/uL — ABNORMAL LOW (ref 3.87–5.11)
RDW: 14 % (ref 11.5–15.5)
WBC: 2.4 10*3/uL — ABNORMAL LOW (ref 4.0–10.5)
nRBC: 0 % (ref 0.0–0.2)

## 2020-08-14 LAB — COMPREHENSIVE METABOLIC PANEL
ALT: 95 U/L — ABNORMAL HIGH (ref 0–44)
AST: 122 U/L — ABNORMAL HIGH (ref 15–41)
Albumin: 3.1 g/dL — ABNORMAL LOW (ref 3.5–5.0)
Alkaline Phosphatase: 73 U/L (ref 38–126)
Anion gap: 9 (ref 5–15)
BUN: 37 mg/dL — ABNORMAL HIGH (ref 8–23)
CO2: 19 mmol/L — ABNORMAL LOW (ref 22–32)
Calcium: 8.2 mg/dL — ABNORMAL LOW (ref 8.9–10.3)
Chloride: 103 mmol/L (ref 98–111)
Creatinine, Ser: 3.2 mg/dL — ABNORMAL HIGH (ref 0.44–1.00)
GFR, Estimated: 16 mL/min — ABNORMAL LOW (ref >60)
Glucose, Bld: 180 mg/dL — ABNORMAL HIGH (ref 70–99)
Potassium: 4 mmol/L (ref 3.5–5.1)
Sodium: 131 mmol/L — ABNORMAL LOW (ref 135–145)
Total Bilirubin: 0.7 mg/dL (ref 0.3–1.2)
Total Protein: 6.4 g/dL — ABNORMAL LOW (ref 6.5–8.1)

## 2020-08-14 LAB — URINALYSIS, ROUTINE W REFLEX MICROSCOPIC
Bilirubin Urine: NEGATIVE
Glucose, UA: NEGATIVE mg/dL
Ketones, ur: NEGATIVE mg/dL
Nitrite: NEGATIVE
Protein, ur: 100 mg/dL — AB
Specific Gravity, Urine: 1.015 (ref 1.005–1.030)
WBC, UA: >50 WBC/hpf — ABNORMAL HIGH (ref 0–5)
pH: 5 (ref 5.0–8.0)

## 2020-08-14 LAB — GLUCOSE, CAPILLARY
Glucose-Capillary: 174 mg/dL — ABNORMAL HIGH (ref 70–99)
Glucose-Capillary: 170 mg/dL — ABNORMAL HIGH (ref 70–99)
Glucose-Capillary: 88 mg/dL (ref 70–99)
Glucose-Capillary: 150 mg/dL — ABNORMAL HIGH (ref 70–99)
Glucose-Capillary: 132 mg/dL — ABNORMAL HIGH (ref 70–99)

## 2020-08-14 LAB — HIV ANTIBODY (ROUTINE TESTING W REFLEX): HIV Screen 4th Generation wRfx: NONREACTIVE

## 2020-08-14 LAB — MAGNESIUM: Magnesium: 1.7 mg/dL (ref 1.7–2.4)

## 2020-08-14 LAB — SARS CORONAVIRUS 2 (TAT 6-24 HRS): SARS Coronavirus 2: NEGATIVE

## 2020-08-14 MED ORDER — GLIPIZIDE 5 MG PO TABS
5.0000 mg | ORAL_TABLET | Freq: Every day | ORAL | Status: DC
  Filled 2020-08-14: qty 1

## 2020-08-14 MED ORDER — NITROGLYCERIN 0.4 MG SL SUBL: 0.4000 mg | SUBLINGUAL_TABLET | SUBLINGUAL | Status: DC | PRN

## 2020-08-14 MED ORDER — FERROUS SULFATE 300 (60 FE) MG/5ML PO SYRP
300.0000 mg | ORAL_SOLUTION | Freq: Every day | ORAL | Status: DC
  Administered 2020-08-14 – 2020-08-16 (×3): 300 mg via ORAL
  Filled 2020-08-14 (×4): qty 5

## 2020-08-14 MED ORDER — POLYETHYLENE GLYCOL 3350 17 G PO PACK
17.0000 g | PACK | Freq: Every day | ORAL | Status: DC
  Filled 2020-08-14: qty 1

## 2020-08-14 MED ORDER — ATORVASTATIN CALCIUM 80 MG PO TABS
80.0000 mg | ORAL_TABLET | Freq: Every day | ORAL | Status: DC
  Administered 2020-08-14 – 2020-08-15 (×2): 80 mg via ORAL
  Filled 2020-08-14 (×2): qty 1

## 2020-08-14 MED ORDER — VITAMIN D 25 MCG (1000 UNIT) PO TABS
1000.0000 [IU] | ORAL_TABLET | ORAL | Status: DC
  Administered 2020-08-14: 1000 [IU] via ORAL
  Filled 2020-08-14: qty 1

## 2020-08-14 MED ORDER — LACTATED RINGERS IV SOLN: INTRAVENOUS | Status: DC

## 2020-08-14 MED ORDER — PANTOPRAZOLE SODIUM 40 MG PO TBEC
40.0000 mg | DELAYED_RELEASE_TABLET | Freq: Two times a day (BID) | ORAL | Status: DC
  Administered 2020-08-14 – 2020-08-16 (×5): 40 mg via ORAL
  Filled 2020-08-14 (×5): qty 1

## 2020-08-14 MED ORDER — CARVEDILOL 12.5 MG PO TABS
12.5000 mg | ORAL_TABLET | Freq: Two times a day (BID) | ORAL | Status: DC
  Administered 2020-08-14 – 2020-08-16 (×5): 12.5 mg via ORAL
  Filled 2020-08-14 (×5): qty 1

## 2020-08-14 MED ORDER — INSULIN DETEMIR 100 UNIT/ML ~~LOC~~ SOLN
50.0000 [IU] | Freq: Every day | SUBCUTANEOUS | Status: DC
  Filled 2020-08-14: qty 0.5

## 2020-08-14 MED ORDER — TICAGRELOR 60 MG PO TABS
60.0000 mg | ORAL_TABLET | Freq: Two times a day (BID) | ORAL | Status: DC
  Administered 2020-08-14 – 2020-08-16 (×5): 60 mg via ORAL
  Filled 2020-08-14 (×6): qty 1

## 2020-08-14 MED ORDER — SALINE SPRAY 0.65 % NA SOLN
1.0000 | Freq: Four times a day (QID) | NASAL | Status: DC | PRN
  Filled 2020-08-14: qty 44

## 2020-08-14 MED ORDER — SPIRONOLACTONE 25 MG PO TABS: 25.0000 mg | ORAL_TABLET | Freq: Every day | ORAL | Status: DC

## 2020-08-14 MED ORDER — SENNA 8.6 MG PO TABS: 2.0000 | ORAL_TABLET | Freq: Every evening | ORAL | Status: DC | PRN

## 2020-08-14 MED ORDER — ADULT MULTIVITAMIN W/MINERALS CH
1.0000 | ORAL_TABLET | Freq: Every day | ORAL | Status: DC
  Administered 2020-08-14 – 2020-08-16 (×3): 1 via ORAL
  Filled 2020-08-14 (×3): qty 1

## 2020-08-14 MED ORDER — GABAPENTIN 300 MG PO CAPS
300.0000 mg | ORAL_CAPSULE | Freq: Every day | ORAL | Status: DC
  Administered 2020-08-14 – 2020-08-15 (×3): 300 mg via ORAL
  Filled 2020-08-14 (×3): qty 1

## 2020-08-14 MED ORDER — ASPIRIN 81 MG PO CHEW
81.0000 mg | CHEWABLE_TABLET | Freq: Every day | ORAL | Status: DC
  Administered 2020-08-14 – 2020-08-16 (×3): 81 mg via ORAL
  Filled 2020-08-14 (×3): qty 1

## 2020-08-14 MED ORDER — TIZANIDINE HCL 2 MG PO TABS
4.0000 mg | ORAL_TABLET | Freq: Every day | ORAL | Status: DC
  Administered 2020-08-14 – 2020-08-15 (×3): 4 mg via ORAL
  Filled 2020-08-14 (×3): qty 2

## 2020-08-14 MED ORDER — INSULIN DETEMIR 100 UNIT/ML ~~LOC~~ SOLN
35.0000 [IU] | Freq: Every day | SUBCUTANEOUS | Status: DC
  Administered 2020-08-14 – 2020-08-15 (×2): 35 [IU] via SUBCUTANEOUS
  Filled 2020-08-14 (×3): qty 0.35

## 2020-08-14 NOTE — Progress Notes (Signed)
PROGRESS NOTE   Stephanie Manning  WVP:[id number]    DOB: 11-19-1960    DOA: 08/13/2020  PCP: [name], MD   I have briefly reviewed patients previous medical records in [facility].  Chief complaint: Dizziness and weakness.  Brief Narrative:  60 year old female with medical history significant for but not limited to essential hypertension, type II DM, STEMI/NSTEMI/CAD s/p PCI/ischemic cardiomyopathy, chronic combined CHF, stage IIIb CKD, hyperlipidemia, and iron deficiency anemia, has been feeling poorly 1.5 weeks prior to admission, presented to the ED due to hypotension, dizziness, weakness, decreased oral intake, mostly bedbound due to dizziness, unable to eat or drink and feeling dehydrated, had a self-limited episode of diarrhea.  Home blood pressure checks revealed SBP in the 60s- 70s.  Orthostatic in the ED.  Admitted for acute on chronic kidney disease.   Assessment & Plan:  Principal Problem:   AKI (acute kidney injury) ([place]) Active Problems:   DM (diabetes mellitus), type 2 ([place])   Essential hypertension   Pancytopenia (HCC)   Coronary artery disease involving native coronary artery of native heart with unstable angina pectoris (HCC)   CKD (chronic kidney disease) stage 4, GFR 15-29 ml/min (HCC)   Pure hypercholesterolemia   Type 2 diabetes mellitus with stage 3b chronic kidney disease, with long-term current use of insulin (HCC)   GERD (gastroesophageal reflux disease)   Noncompliance   Obesity (BMI 30-39.9)   Acute kidney injury complicating stage IIIb chronic kidney disease: Baseline creatinine probably ranges in the 1.5-1.8.  Presented with creatinine of 3.28.  Likely multifactorial due to prerenal/dehydration and continued use of Entresto and Aldactone.  Temporarily hold these medicines.  IV fluid hydration with close monitoring.  Denies NSAID use.  Avoid nephrotoxic's.  Strict intake and output.  Trend daily BMP.  If worsens or if does not improve,  consider renal ultrasound and nephrology consultation.  Patient states that she does not follow with a nephrologist and may need to see one upon discharge.  Pancytopenia: Unclear etiology.  May be due to acute illness/?  Acute viral illness.  No clear signs and symptoms suggestive of infectious etiology.  Trend daily CBC.  If does not improve or worsens, may consider hematology consultation.  Transaminitis: Unclear etiology.?  Nonspecific viral illness.  AST/ALT normal in December.  Now in the 120s/90s.  Normal bilirubin.  Acute hepatitis panel negative.  HIV screen negative.  Trend daily CMP.  Asymptomatic bacteriuria: Patient reports that she has had UTIs in the past and currently without symptoms suggesting of same.  Type II DM/IDDM with CKD: Reduce dose of Levemir marginally to avoid in-house hypoglycemia.  Continue SSI.  Monitor CBGs closely.  Essential hypertension: Continue carvedilol.  Discontinued Aldactone due to AKI.  Controlled.  Chronic combined CHF: Last known EF 40-45%.  Clinically dry.  Holding diuretics and Entresto.  Monitor closely while on IVF.  Follows with Dr. Greer Ee with cardiology.  GERD: PPI.  Body mass index is 35.28 kg/m.   DVT prophylaxis: heparin injection 5,000 Units Start: 08/13/20 2200     Code Status: Full Code Family Communication:  Disposition:  Status is: Inpatient  Remains inpatient appropriate because:Inpatient level of care appropriate due to severity of illness   Dispo: The patient is from: Home              Anticipated d/c is to: Home              Patient currently is not medically stable to d/c.  Consultants:   None  Procedures:   None  Antimicrobials:    Anti-infectives (From admission, onward)   None        Subjective:  Overall feels better.  Feels stronger.  Urine output which was low is improving.  No dizziness.  States that she recently had been to [facility] ED, evaluated and discharged without  medications.  Objective:   Vitals:   08/14/20 0309 08/14/20 0835 08/14/20 1105 08/14/20 1530  BP: 121/65 128/67 126/75 125/73  Pulse: 83 77 84 89  Resp: 17 16 18    Temp: 98.7 F (37.1 C)  99 F (37.2 C) 98.4 F (36.9 C)  TempSrc: Oral  Axillary Oral  SpO2: 100% 100% 100% 100%  Weight:      Height:        General exam: Young female, moderately built and nourished lying comfortably supine in bed without distress.  Oral mucosa dry. Respiratory system: Clear to auscultation. Respiratory effort normal. Cardiovascular system: S1 & S2 heard, RRR. No JVD, murmurs, rubs, gallops or clicks. No pedal edema.  Telemetry personally reviewed: Sinus rhythm. Gastrointestinal system: Abdomen is nondistended, soft and nontender. No organomegaly or masses felt. Normal bowel sounds heard. Central nervous system: Alert and oriented. No focal neurological deficits. Extremities: Symmetric 5 x 5 power. Skin: No rashes, lesions or ulcers Psychiatry: Judgement and insight appear normal. Mood & affect appropriate.     Data Reviewed:   I have personally reviewed following labs and imaging studies   CBC: Recent Labs  Lab 08/13/20 1613 08/14/20 0254  WBC 2.8* 2.4*  NEUTROABS 1.6* 1.2*  HGB 10.5* 9.2*  HCT 33.8* 29.2*  MCV 90.4 90.4  PLT 89* 81*    Basic Metabolic Panel: Recent Labs  Lab 08/13/20 1613 08/14/20 0254 08/14/20 0255  NA 133* 131*  --   K 4.1 4.0  --   CL 104 103  --   CO2 22 19*  --   GLUCOSE 96 180*  --   BUN 34* 37*  --   CREATININE 3.28* 3.20*  --   CALCIUM 8.7* 8.2*  --   MG  --   --  1.7    Liver Function Tests: Recent Labs  Lab 08/13/20 1613 08/14/20 0254  AST 123* 122*  ALT 94* 95*  ALKPHOS 73 73  BILITOT 0.9 0.7  PROT 6.7 6.4*  ALBUMIN 3.2* 3.1*    CBG: Recent Labs  Lab 08/14/20 0340 08/14/20 0730 08/14/20 1207  GLUCAP 174* 170* 88    Microbiology Studies:   Recent Results (from the past 240 hour(s))  SARS CORONAVIRUS 2 (TAT 6-24 HRS)  Nasopharyngeal Nasopharyngeal Swab     Status: None   Collection Time: 08/13/20  6:15 PM   Specimen: Nasopharyngeal Swab  Result Value Ref Range Status   SARS Coronavirus 2 NEGATIVE NEGATIVE Final    Comment: (NOTE) SARS-CoV-2 target nucleic acids are NOT DETECTED.  The SARS-CoV-2 RNA is generally detectable in upper and lower respiratory specimens during the acute phase of infection. Negative results do not preclude SARS-CoV-2 infection, do not rule out co-infections with other pathogens, and should not be used as the sole basis for treatment or other patient management decisions. Negative results must be combined with clinical observations, patient history, and epidemiological information. The expected result is Negative.  Fact Sheet for Patients: SugarRoll.be  Fact Sheet for Healthcare Providers: https://www.woods-mathews.com/  This test is not yet approved or cleared by the [place] FDA and  has been authorized for detection and/or  diagnosis of SARS-CoV-2 by FDA under an Emergency Use Authorization (EUA). This EUA will remain  in effect (meaning this test can be used) for the duration of the COVID-19 declaration under Se ction 564(b)(1) of the Act, 21 U.S.C. section 360bbb-3(b)(1), unless the authorization is terminated or revoked sooner.  Performed at [facility], [place] [address] [place], [place] [address]      Radiology Studies:  DG Chest Portable 1 View  Result Date: 08/13/2020 CLINICAL DATA:  Hypotension and dizziness EXAM: PORTABLE CHEST 1 VIEW COMPARISON:  Aug 12, 2020 FINDINGS: The lungs are clear. The heart size and pulmonary vascularity are normal. No adenopathy. No bone lesions. IMPRESSION: Lungs clear.  Cardiac silhouette normal. Electronically Signed   By: Lowella Grip III M.D.   On: 08/13/2020 16:36     Scheduled Meds:   . aspirin  81 mg Oral Daily  . atorvastatin  80 mg Oral q1800  . carvedilol   12.5 mg Oral BID WC  . cholecalciferol  1,000 Units Oral Once per day on Mon Thu  . ferrous sulfate  300 mg Oral Q breakfast  . gabapentin  300 mg Oral QHS  . heparin  5,000 Units Subcutaneous Q8H  . insulin aspart  0-5 Units Subcutaneous QHS  . insulin aspart  0-9 Units Subcutaneous TID WC  . insulin detemir  50 Units Subcutaneous QHS  . multivitamin with minerals  1 tablet Oral Daily  . pantoprazole  40 mg Oral BID  . ticagrelor  60 mg Oral BID  . tiZANidine  4 mg Oral QHS    Continuous Infusions:   . lactated ringers 125 mL/hr at 08/14/20 1333     LOS: 1 day     Vernell Leep, MD, [place], [facility]. [facility]    To contact the attending provider between 7A-7P or the covering provider during after hours 7P-7A, please log into the web site [email or web address] and access using universal [place] password for that web site. If you do not have the password, please call the hospital operator.  08/14/2020, 4:06 PM

## 2020-08-14 NOTE — Progress Notes (Signed)
[facility] notified patient has 3 beat run of V Tachycardia bp 121/65 HR 83 asymptomatic. Arthor Captain LPN

## 2020-08-14 NOTE — Telephone Encounter (Signed)
Patient seen in ED and plans for admission for ARF and dehydration  Stephanie Hoots [name] MD, [facility]

## 2020-08-14 NOTE — ED Notes (Signed)
Called to give report x1.

## 2020-08-14 NOTE — Plan of Care (Signed)

## 2020-08-14 NOTE — Plan of Care (Signed)
  Problem: Health Behavior/Discharge Planning: Goal: Ability to manage health-related needs will improve Outcome: Progressing   

## 2020-08-15 LAB — COMPREHENSIVE METABOLIC PANEL
ALT: 78 U/L — ABNORMAL HIGH (ref 0–44)
AST: 96 U/L — ABNORMAL HIGH (ref 15–41)
Albumin: 2.9 g/dL — ABNORMAL LOW (ref 3.5–5.0)
Alkaline Phosphatase: 78 U/L (ref 38–126)
Anion gap: 6 (ref 5–15)
BUN: 33 mg/dL — ABNORMAL HIGH (ref 8–23)
CO2: 24 mmol/L (ref 22–32)
Calcium: 8.6 mg/dL — ABNORMAL LOW (ref 8.9–10.3)
Chloride: 106 mmol/L (ref 98–111)
Creatinine, Ser: 2.31 mg/dL — ABNORMAL HIGH (ref 0.44–1.00)
GFR, Estimated: 23 mL/min — ABNORMAL LOW (ref >60)
Glucose, Bld: 127 mg/dL — ABNORMAL HIGH (ref 70–99)
Potassium: 4.2 mmol/L (ref 3.5–5.1)
Sodium: 136 mmol/L (ref 135–145)
Total Bilirubin: 0.9 mg/dL (ref 0.3–1.2)
Total Protein: 6.5 g/dL (ref 6.5–8.1)

## 2020-08-15 LAB — CBC
HCT: 30.3 % — ABNORMAL LOW (ref 36.0–46.0)
Hemoglobin: 9.7 g/dL — ABNORMAL LOW (ref 12.0–15.0)
MCH: 27.8 pg (ref 26.0–34.0)
MCHC: 32 g/dL (ref 30.0–36.0)
MCV: 86.8 fL (ref 80.0–100.0)
Platelets: 96 10*3/uL — ABNORMAL LOW (ref 150–400)
RBC: 3.49 MIL/uL — ABNORMAL LOW (ref 3.87–5.11)
RDW: 14.1 % (ref 11.5–15.5)
WBC: 3.3 10*3/uL — ABNORMAL LOW (ref 4.0–10.5)
nRBC: 0 % (ref 0.0–0.2)

## 2020-08-15 LAB — GLUCOSE, CAPILLARY
Glucose-Capillary: 79 mg/dL (ref 70–99)
Glucose-Capillary: 145 mg/dL — ABNORMAL HIGH (ref 70–99)
Glucose-Capillary: 97 mg/dL (ref 70–99)
Glucose-Capillary: 187 mg/dL — ABNORMAL HIGH (ref 70–99)

## 2020-08-15 LAB — HEMOGLOBIN A1C
Hgb A1c MFr Bld: 7.7 % — ABNORMAL HIGH (ref 4.8–5.6)
Mean Plasma Glucose: 174 mg/dL

## 2020-08-15 MED ORDER — LACTATED RINGERS IV SOLN: INTRAVENOUS | Status: DC

## 2020-08-15 NOTE — Care Management Important Message (Signed)
Important Message  Patient Details  Name: Stephanie Manning MRN: [id number] Date of Birth: [birth year]   Medicare Important Message Given:  Yes     Adelaide Pfefferkorn 08/15/2020, 3:07 PM

## 2020-08-15 NOTE — Plan of Care (Signed)

## 2020-08-15 NOTE — Plan of Care (Signed)

## 2020-08-15 NOTE — Progress Notes (Signed)
PROGRESS NOTE   Stephanie Manning  MCN:[id number]    DOB: [birth year]    DOA: 08/13/2020  PCP: [name], MD   I have briefly reviewed patients previous medical records in [facility].  Chief complaint: Dizziness and weakness.  Brief Narrative:  60 year old female with medical history significant for but not limited to essential hypertension, type II DM, STEMI/NSTEMI/CAD s/p PCI/ischemic cardiomyopathy, chronic combined CHF, stage IIIb CKD, hyperlipidemia, and iron deficiency anemia, has been feeling poorly 1.5 weeks prior to admission, presented to the ED due to hypotension, dizziness, weakness, decreased oral intake, mostly bedbound due to dizziness, unable to eat or drink and feeling dehydrated, had a self-limited episode of diarrhea.  Home blood pressure checks revealed SBP in the 60s- 70s.  Orthostatic in the ED.  Admitted for acute on chronic kidney disease.  AKI improving.   Assessment & Plan:  Principal Problem:   AKI (acute kidney injury) ([place]) Active Problems:   DM (diabetes mellitus), type 2 ([place])   Essential hypertension   Pancytopenia (HCC)   Coronary artery disease involving native coronary artery of native heart with unstable angina pectoris (HCC)   CKD (chronic kidney disease) stage 4, GFR 15-29 ml/min (HCC)   Pure hypercholesterolemia   Type 2 diabetes mellitus with stage 3b chronic kidney disease, with long-term current use of insulin (HCC)   GERD (gastroesophageal reflux disease)   Noncompliance   Obesity (BMI 30-39.9)   Acute kidney injury complicating stage IIIb chronic kidney disease: Baseline creatinine probably ranges in the 1.5-1.8.  Presented with creatinine of 3.28.  Likely multifactorial due to prerenal/dehydration and continued use of Entresto and Aldactone.  Temporarily hold these medicines. Denies NSAID use.  Avoid nephrotoxic's.  Strict intake and output.  Trend daily BMP.  Improved after IV fluids.  Creatinine down to 2.3.  Reporting some  dyspnea today despite reducing IVF rate in the absence of any overt fluid overload features.  Discontinued IVF, monitor closely, follow BMP in a.m. and if improving, possible discharge home with plans to resume diuretics either at discharge or in 1 to 2 days after discharge with close outpatient follow-up with repeat BMP.  Pancytopenia: Unclear etiology.  May be due to acute illness/?  Acute viral illness.  No clear signs and symptoms suggestive of infectious etiology.  Trend daily CBC.  If does not improve or worsens, may consider hematology consultation.  Improving.  Transaminitis: Unclear etiology.?  Nonspecific viral illness.  AST/ALT normal in December.  Now in the 120s/90s.  Normal bilirubin.  Acute hepatitis panel negative.  HIV screen negative.  Trend daily CMP.  Improving.  Asymptomatic bacteriuria: Patient reports that she has had UTIs in the past and currently without symptoms suggesting of same.  Type II DM/IDDM with CKD: Reduced dose of Levemir marginally to avoid in-house hypoglycemia.  Continue SSI.  Monitor CBGs closely.  Reasonable inpatient control.  Essential hypertension: Continue carvedilol.  Discontinued Aldactone due to AKI.  Controlled.  Chronic combined CHF: Last known EF 40-45% in November 2021.  Clinically dry.  Holding diuretics and Entresto.  Monitor closely while on IVF.  Follows with Dr. Greer Ee with cardiology.  GERD: PPI.  Body mass index is 36.04 kg/m.   DVT prophylaxis: heparin injection 5,000 Units Start: 08/13/20 2200     Code Status: Full Code Family Communication:  Disposition:  Status is: Inpatient  Remains inpatient appropriate because:Inpatient level of care appropriate due to severity of illness   Dispo: The patient is from: Home  Anticipated d/c is to: Home              Patient currently is not medically stable to d/c.     Consultants:   None  Procedures:   None  Antimicrobials:    Anti-infectives  (From admission, onward)   None        Subjective:  When seen this morning, reported having good urine output, not really sure if she had some intermittent dyspnea.  Subsequently in the early afternoon, RN advised that patient was reporting some dyspnea and wanted IV fluids stopped.  Objective:   Vitals:   08/15/20 0644 08/15/20 0648 08/15/20 0856 08/15/20 0900  BP: (!) 146/83 (!) 146/83 127/84 133/68  Pulse: 87 85 (!) 59 94  Resp:  18    Temp:  99.3 F (37.4 C)    TempSrc:  Oral    SpO2: 99% 99%    Weight:  107.5 kg    Height:        General exam: Young female, moderately built and nourished sitting up comfortably in bed.  Oral mucosa moist. Respiratory system: Clear to auscultation without wheezing, rhonchi or crackles.  No increased work of breathing. Cardiovascular system: S1 and S2 heard, RRR.  No JVD, murmurs or pedal edema.  Telemetry personally reviewed: Sinus rhythm with occasional PVCs and trigeminy. Gastrointestinal system: Abdomen is nondistended, soft and nontender. No organomegaly or masses felt. Normal bowel sounds heard. Central nervous system: Alert and oriented. No focal neurological deficits. Extremities: Symmetric 5 x 5 power. Skin: No rashes, lesions or ulcers Psychiatry: Judgement and insight appear normal. Mood & affect appropriate.     Data Reviewed:   I have personally reviewed following labs and imaging studies   CBC: Recent Labs  Lab 08/13/20 1613 08/14/20 0254 08/15/20 0058  WBC 2.8* 2.4* 3.3*  NEUTROABS 1.6* 1.2*  --   HGB 10.5* 9.2* 9.7*  HCT 33.8* 29.2* 30.3*  MCV 90.4 90.4 86.8  PLT 89* 81* 96*    Basic Metabolic Panel: Recent Labs  Lab 08/13/20 1613 08/14/20 0254 08/14/20 0255 08/15/20 0058  NA 133* 131*  --  136  K 4.1 4.0  --  4.2  CL 104 103  --  106  CO2 22 19*  --  24  GLUCOSE 96 180*  --  127*  BUN 34* 37*  --  33*  CREATININE 3.28* 3.20*  --  2.31*  CALCIUM 8.7* 8.2*  --  8.6*  MG  --   --  1.7  --      Liver Function Tests: Recent Labs  Lab 08/13/20 1613 08/14/20 0254 08/15/20 0058  AST 123* 122* 96*  ALT 94* 95* 78*  ALKPHOS 73 73 78  BILITOT 0.9 0.7 0.9  PROT 6.7 6.4* 6.5  ALBUMIN 3.2* 3.1* 2.9*    CBG: Recent Labs  Lab 08/14/20 1946 08/15/20 0803 08/15/20 1320  GLUCAP 132* 79 145*    Microbiology Studies:   Recent Results (from the past 240 hour(s))  SARS CORONAVIRUS 2 (TAT 6-24 HRS) Nasopharyngeal Nasopharyngeal Swab     Status: None   Collection Time: 08/13/20  6:15 PM   Specimen: Nasopharyngeal Swab  Result Value Ref Range Status   SARS Coronavirus 2 NEGATIVE NEGATIVE Final    Comment: (NOTE) SARS-CoV-2 target nucleic acids are NOT DETECTED.  The SARS-CoV-2 RNA is generally detectable in upper and lower respiratory specimens during the acute phase of infection. Negative results do not preclude SARS-CoV-2 infection, do not rule out co-infections  with other pathogens, and should not be used as the sole basis for treatment or other patient management decisions. Negative results must be combined with clinical observations, patient history, and epidemiological information. The expected result is Negative.  Fact Sheet for Patients: SugarRoll.be  Fact Sheet for Healthcare Providers: https://www.woods-mathews.com/  This test is not yet approved or cleared by the [place] FDA and  has been authorized for detection and/or diagnosis of SARS-CoV-2 by FDA under an Emergency Use Authorization (EUA). This EUA will remain  in effect (meaning this test can be used) for the duration of the COVID-19 declaration under Se ction 564(b)(1) of the Act, 21 U.S.C. section 360bbb-3(b)(1), unless the authorization is terminated or revoked sooner.  Performed at [facility], [place] [address] [place], [place] [address]      Radiology Studies:  DG Chest Portable 1 View  Result Date: 08/13/2020 CLINICAL DATA:   Hypotension and dizziness EXAM: PORTABLE CHEST 1 VIEW COMPARISON:  Aug 12, 2020 FINDINGS: The lungs are clear. The heart size and pulmonary vascularity are normal. No adenopathy. No bone lesions. IMPRESSION: Lungs clear.  Cardiac silhouette normal. Electronically Signed   By: Lowella Grip III M.D.   On: 08/13/2020 16:36     Scheduled Meds:   . aspirin  81 mg Oral Daily  . atorvastatin  80 mg Oral q1800  . carvedilol  12.5 mg Oral BID WC  . cholecalciferol  1,000 Units Oral Once per day on Mon Thu  . ferrous sulfate  300 mg Oral Q breakfast  . gabapentin  300 mg Oral QHS  . heparin  5,000 Units Subcutaneous Q8H  . insulin aspart  0-5 Units Subcutaneous QHS  . insulin aspart  0-9 Units Subcutaneous TID WC  . insulin detemir  35 Units Subcutaneous QHS  . multivitamin with minerals  1 tablet Oral Daily  . pantoprazole  40 mg Oral BID  . polyethylene glycol  17 g Oral Daily  . ticagrelor  60 mg Oral BID  . tiZANidine  4 mg Oral QHS    Continuous Infusions:      LOS: 2 days     Vernell Leep, MD, [place], [facility]. [facility]    To contact the attending provider between 7A-7P or the covering provider during after hours 7P-7A, please log into the web site [email or web address] and access using universal [place] password for that web site. If you do not have the password, please call the hospital operator.  08/15/2020, 2:24 PM

## 2020-08-15 NOTE — Care Management Important Message (Signed)
Important Message  Patient Details  Name: Stephanie Manning MRN: [id number] Date of Birth: 1960-09-09   Medicare Important Message Given:  Yes     Stephanie Manning 08/15/2020, 3:08 PM

## 2020-08-16 LAB — COMPREHENSIVE METABOLIC PANEL
ALT: 65 U/L — ABNORMAL HIGH (ref 0–44)
AST: 76 U/L — ABNORMAL HIGH (ref 15–41)
Albumin: 2.5 g/dL — ABNORMAL LOW (ref 3.5–5.0)
Alkaline Phosphatase: 68 U/L (ref 38–126)
Anion gap: 8 (ref 5–15)
BUN: 23 mg/dL (ref 8–23)
CO2: 22 mmol/L (ref 22–32)
Calcium: 8.2 mg/dL — ABNORMAL LOW (ref 8.9–10.3)
Chloride: 105 mmol/L (ref 98–111)
Creatinine, Ser: 1.96 mg/dL — ABNORMAL HIGH (ref 0.44–1.00)
GFR, Estimated: 29 mL/min — ABNORMAL LOW (ref >60)
Glucose, Bld: 183 mg/dL — ABNORMAL HIGH (ref 70–99)
Potassium: 3.8 mmol/L (ref 3.5–5.1)
Sodium: 135 mmol/L (ref 135–145)
Total Bilirubin: 0.4 mg/dL (ref 0.3–1.2)
Total Protein: 5.6 g/dL — ABNORMAL LOW (ref 6.5–8.1)

## 2020-08-16 LAB — CBC
HCT: 26.2 % — ABNORMAL LOW (ref 36.0–46.0)
Hemoglobin: 8.3 g/dL — ABNORMAL LOW (ref 12.0–15.0)
MCH: 27.6 pg (ref 26.0–34.0)
MCHC: 31.7 g/dL (ref 30.0–36.0)
MCV: 87 fL (ref 80.0–100.0)
Platelets: 102 10*3/uL — ABNORMAL LOW (ref 150–400)
RBC: 3.01 MIL/uL — ABNORMAL LOW (ref 3.87–5.11)
RDW: 14.4 % (ref 11.5–15.5)
WBC: 4.1 10*3/uL (ref 4.0–10.5)
nRBC: 0 % (ref 0.0–0.2)

## 2020-08-16 LAB — GLUCOSE, CAPILLARY
Glucose-Capillary: 69 mg/dL — ABNORMAL LOW (ref 70–99)
Glucose-Capillary: 92 mg/dL (ref 70–99)
Glucose-Capillary: 162 mg/dL — ABNORMAL HIGH (ref 70–99)

## 2020-08-16 MED ORDER — SPIRONOLACTONE 25 MG PO TABS: 25.0000 mg | ORAL_TABLET | Freq: Every day | ORAL | Status: DC

## 2020-08-16 MED ORDER — SACUBITRIL-VALSARTAN 97-103 MG PO TABS: 1.0000 | ORAL_TABLET | Freq: Two times a day (BID) | ORAL | Status: DC

## 2020-08-16 MED ORDER — LEVEMIR FLEXTOUCH 100 UNIT/ML ~~LOC~~ SOPN: 30.0000 [IU] | PEN_INJECTOR | Freq: Every day | SUBCUTANEOUS | Status: DC

## 2020-08-16 NOTE — Discharge Instructions (Signed)

## 2020-08-16 NOTE — Discharge Summary (Signed)
Physician Discharge Summary  Stephanie Manning KXF:[id number] DOB: Mar 25, 1960  PCP: [name], MD  Admitted from: Home Discharged to: Home  Admit date: 08/13/2020 Discharge date: 08/16/2020  Recommendations for Outpatient Follow-up:    Follow-up Information    Pallone, Jarvis Newcomer, MD. Schedule an appointment as soon as possible for a visit in 1 week(s).   Specialty: Family Medicine Why: To be seen with repeat labs (CBC & CMP). Contact information: [address] [place] [address] [phone]        [name], [name], MD. Schedule an appointment as soon as possible for a visit in 1 week(s).   Specialty: Cardiology Contact information: [address] [place] [address] [phone]        [name], MD Follow up.   Specialty: Cardiology Contact information: [address] [place] [address] [phone]        [name], MD. Schedule an appointment as soon as possible for a visit.   Specialty: Endocrinology Contact information: [place] [address] [phone]                Home Health: None    Equipment/Devices: None    Discharge Condition: Improved and stable   Code Status: Full Code Diet recommendation:  Discharge Diet Orders (From admission, onward)    Start     Ordered   08/16/20 0000  Diet - low sodium heart healthy        08/16/20 1124   08/16/20 0000  Diet Carb Modified        08/16/20 1124           Discharge Diagnoses:  Principal Problem:   AKI (acute kidney injury) ([place]) Active Problems:   DM (diabetes mellitus), type 2 ([place])   Essential hypertension   Pancytopenia ([place])   Coronary artery disease involving native coronary artery of native heart with unstable angina pectoris (HCC)   CKD (chronic kidney disease) stage 4, GFR 15-29 ml/min (HCC)   Pure hypercholesterolemia   Type 2 diabetes mellitus with stage 3b chronic kidney disease, with  long-term current use of insulin (HCC)   GERD (gastroesophageal reflux disease)   Noncompliance   Obesity (BMI 30-39.9)   Brief Summary: 60 year old female with medical history significant for but not limited to essential hypertension, type II DM, STEMI/NSTEMI/CAD s/p PCI/ischemic cardiomyopathy, chronic combined CHF, stage IIIb CKD, hyperlipidemia, and iron deficiency anemia, has been feeling poorly 1.5 weeks prior to admission, presented to the ED due to hypotension, dizziness, weakness, decreased oral intake, mostly bedbound due to dizziness, unable to eat or drink and feeling dehydrated, had a self-limited episode of diarrhea.  Home blood pressure checks revealed SBP in the 60s- 70s.  Orthostatic in the ED.  Admitted for acute on chronic kidney disease.  AKI improving.   Assessment & Plan:    Acute kidney injury complicating stage IIIb chronic kidney disease: Baseline creatinine probably ranges in the 1.5-1.8.  Presented with creatinine of 3.28.  Likely multifactorial due to prerenal/dehydration and continued use of Entresto and Aldactone.  Temporarily hold these medicines. Denies NSAID use.  Avoid nephrotoxic's.  Strict intake and output.  Improved after IV fluids.  Creatinine down to 1.9.  Yesterday afternoon, patient reported some dyspnea in the absence of any clear clinical volume overload.  IV fluids were stopped.  She was monitored overnight.   Continues to have good urine output.  Dyspnea has resolved.  Patient is advised to  resume Entresto and Aldactone after 48 hours which would give adequate time for complete renal recovery back to her baseline and also patient at this time is not clinically volume overloaded.  Pancytopenia: Unclear etiology.  May be due to acute illness/?  Acute viral illness.  No clear signs and symptoms suggestive of infectious etiology.    Leukopenia resolved.  Thrombocytopenia continues to improve.  Hemoglobin has dropped from 9 g range to 8.3 in the absence of  overt bleeding.  Possibly due to hemodilution versus lab error.  Follow CBC as outpatient.  Transaminitis: Unclear etiology.?  Nonspecific viral illness.  AST/ALT normal in December.  Now in the 120s/90s.  Normal bilirubin.  Acute hepatitis panel negative.  HIV screen negative.    Continue to improve and gradually approaching baseline.  Follow CMP as outpatient.  Asymptomatic bacteriuria: Patient reports that she has had UTIs in the past and currently without symptoms suggesting of same.  Type II DM/IDDM with CKD: Patient follows with outpatient endocrinology.  Is on Levemir at home.  Was on Trulicity as well but was giving her constipation and so she voluntarily discontinued this and started taking Glucotrol which had been stopped by her endocrinologist.  Despite cutting back on her Levemir in the hospital, she had mild hypoglycemia this morning with CBG of 69 mg per DL.  She denies hypoglycemic episodes at home.  Discontinued Glucotrol until outpatient follow-up with her endocrinologist.  Recommended reducing Levemir dose to 30 units at bedtime and then based on CBG checks and increasing blood glucose, advised her to gradually uptitrate her Levemir to prior dose of 50 units at bedtime.  She is well educated to do this and states that she checks her CBGs up to 3-4 times a day.  Hypoglycemic episode likely related to insulins in the context of lesser than home oral intake/patient did not like hospital food and AKI.  Essential hypertension: Continue carvedilol.    Entresto and Aldactone which had been held in the hospital will be resumed in about 48 hours of discharge.  Transient and minimally orthostatic blood pressures but asymptomatic of same.  Chronic combined CHF: Last known EF 40-45% in November 2021.    Aldactone and Entresto were held in the hospital and advised to continue to hold for about 48 hours post discharge and then resume with close monitoring of BMP as outpatient.  She is clinically  euvolemic and asymptomatic of dyspnea or leg edema.  Follows with Dr. Greer Ee with cardiology.  GERD: PPI.  Asymptomatic microscopic hematuria: Unclear etiology.  Recommend repeating urine microscopy in 2 to 3 weeks and if this persist then may need further evaluation including urology consultation.  Body mass index is 36.04 kg/m.      Consultants:   None  Procedures:   None     Discharge Instructions  Discharge Instructions    (HEART FAILURE PATIENTS) Call MD:  Anytime you have any of the following symptoms: 1) 3 pound weight gain in 24 hours or 5 pounds in 1 week 2) shortness of breath, with or without a dry hacking cough 3) swelling in the hands, feet or stomach 4) if you have to sleep on extra pillows at night in order to breathe.   Complete by: As directed    Call MD for:  difficulty breathing, headache or visual disturbances   Complete by: As directed    Call MD for:  extreme fatigue   Complete by: As directed    Call MD for:  persistant dizziness or light-headedness   Complete by: As directed    Call MD for:  persistant nausea and vomiting   Complete by: As directed    Call MD for:  severe uncontrolled pain   Complete by: As directed    Call MD for:  temperature >100.4   Complete by: As directed    Diet - low sodium heart healthy   Complete by: As directed    Diet Carb Modified   Complete by: As directed    Increase activity slowly   Complete by: As directed        Medication List    STOP taking these medications   glipiZIDE 5 MG tablet Commonly known as: GLUCOTROL     TAKE these medications   Accu-Chek Guide test strip Generic drug: glucose blood TEST BLOOD SUGAR FOUR TIMES A DAY BEFORE MEALS AND AT BEDTIME   Accu-Chek Softclix Lancets lancets Use as instructed   aspirin 81 MG chewable tablet Chew 1 tablet (81 mg total) by mouth daily.   atorvastatin 80 MG tablet Commonly known as: LIPITOR Take 1 tablet (80 mg total)  by mouth daily at 6 PM.   blood glucose meter kit and supplies Dispense based on patient and insurance preference. Use up to four times daily as directed. (FOR ICD-10 E11.65) Pt prefers One touch mini   carvedilol 12.5 MG tablet Commonly known as: COREG Take 1 tablet (12.5 mg total) by mouth 2 (two) times daily with a meal.   ELDERBERRY PO Take 2 tablets by mouth every other day.   ferrous sulfate 300 (60 Fe) MG/5ML syrup Take 300 mg by mouth daily with breakfast.   gabapentin 300 MG capsule Commonly known as: NEURONTIN Take 300 mg by mouth at bedtime.   Levemir FlexTouch 100 UNIT/ML FlexPen Generic drug: insulin detemir Inject 30 Units into the skin at bedtime. As discussed with you, based on your home fingerstick blood sugar checks, gradually increase back up to your previous dose of 50 units daily at bedtime over the next 3 to 4 days. What changed:   how much to take  additional instructions   multivitamin with minerals Tabs tablet Take 1 tablet by mouth daily.   nitroGLYCERIN 0.4 MG SL tablet Commonly known as: NITROSTAT Place 1 tablet (0.4 mg total) under the tongue every 5 (five) minutes as needed for chest pain.   pantoprazole 40 MG tablet Commonly known as: PROTONIX Take 1 tablet (40 mg total) by mouth 2 (two) times daily.   sacubitril-valsartan 97-103 MG Commonly known as: ENTRESTO Take 1 tablet by mouth 2 (two) times daily. Start taking on: August 18, 2020 What changed: These instructions start on August 18, 2020. If you are unsure what to do until then, ask your doctor or other care provider.   Saline Spray 0.2 % Soln Place 1 spray into both nostrils as needed (congestion).   spironolactone 25 MG tablet Commonly known as: ALDACTONE Take 1 tablet (25 mg total) by mouth daily. Start taking on: August 18, 2020 What changed: These instructions start on August 18, 2020. If you are unsure what to do until then, ask your doctor or other care provider.   ticagrelor 60 MG  Tabs tablet Commonly known as: Brilinta Take 1 tablet (60 mg total) by mouth 2 (two) times daily.   tiZANidine 4 MG tablet Commonly known as: ZANAFLEX Take 4 mg by mouth at bedtime.   VITAMIN D PO Take 1 tablet by mouth 2 (two) times a week.  Mondays and Wednesdays      Allergies  Allergen Reactions  . Hydralazine Other (See Comments)    Syncope due to hypotension  . Bidil [Isosorb Dinitrate-Hydralazine]   . Ciprofloxacin   . Farxiga [Dapagliflozin]   . Propoxycaine   . Amoxicillin Other (See Comments)    Yeast infection during treatment  . Hydrocodone Palpitations, Rash and Other (See Comments)    GI upset, also  . Lisinopril Palpitations and Other (See Comments)    Chest pain, too  . Meloxicam Nausea Only  . Strawberry Extract Hives    Does not always happen  . Tape Rash    Some tapes cause rashes  . Toradol [Ketorolac Tromethamine] Palpitations      Procedures/Studies: DG Chest Portable 1 View  Result Date: 08/13/2020 CLINICAL DATA:  Hypotension and dizziness EXAM: PORTABLE CHEST 1 VIEW COMPARISON:  Aug 12, 2020 FINDINGS: The lungs are clear. The heart size and pulmonary vascularity are normal. No adenopathy. No bone lesions. IMPRESSION: Lungs clear.  Cardiac silhouette normal. Electronically Signed   By: Lowella Grip III M.D.   On: 08/13/2020 16:36      Subjective: Denies complaints.  No further dyspnea.  No chest pain, dizziness or lightheadedness.  No leg edema.  Ambulating comfortably in the room.  Tolerating diet well.  Having BMs  Discharge Exam:  Vitals:   08/15/20 0900 08/15/20 1408 08/15/20 2029 08/16/20 0554  BP: 133/68 (!) 144/52 119/70 128/61  Pulse: 94 81 79 75  Resp:  _0 Temp:  98.7 F (37.1 C) 98.1 F (36.7 C) 98.3 F (36.8 C)  TempSrc:  Oral Oral Oral  SpO2:  99% 100% 100%  Weight:    107.4 kg  Height:        General exam: Young female, moderately built and nourished sitting up comfortably in bed.  Oral mucosa  moist. Respiratory system: Clear to auscultation without wheezing, rhonchi or crackles.  No increased work of breathing. Cardiovascular system: S1 and S2 heard, RRR.  No JVD, murmurs or pedal edema.  Telemetry personally reviewed: Sinus rhythm with occasional PVC Gastrointestinal system: Abdomen is nondistended, soft and nontender. No organomegaly or masses felt. Normal bowel sounds heard. Central nervous system: Alert and oriented. No focal neurological deficits. Extremities: Symmetric 5 x 5 power. Skin: No rashes, lesions or ulcers Psychiatry: Judgement and insight appear normal. Mood & affect appropriate.     The results of significant diagnostics from this hospitalization (including imaging, microbiology, ancillary and laboratory) are listed below for reference.     Microbiology: Recent Results (from the past 240 hour(s))  SARS CORONAVIRUS 2 (TAT 6-24 HRS) Nasopharyngeal Nasopharyngeal Swab     Status: None   Collection Time: 08/13/20  6:15 PM   Specimen: Nasopharyngeal Swab  Result Value Ref Range Status   SARS Coronavirus 2 NEGATIVE NEGATIVE Final    Comment: (NOTE) SARS-CoV-2 target nucleic acids are NOT DETECTED.  The SARS-CoV-2 RNA is generally detectable in upper and lower respiratory specimens during the acute phase of infection. Negative results do not preclude SARS-CoV-2 infection, do not rule out co-infections with other pathogens, and should not be used as the sole basis for treatment or other patient management decisions. Negative results must be combined with clinical observations, patient history, and epidemiological information. The expected result is Negative.  Fact Sheet for Patients: SugarRoll.be  Fact Sheet for Healthcare Providers: https://www.woods-mathews.com/  This test is not yet approved or cleared by the [place] and  has been authorized  for detection and/or diagnosis of SARS-CoV-2 by FDA under an  Emergency Use Authorization (EUA). This EUA will remain  in effect (meaning this test can be used) for the duration of the COVID-19 declaration under Se ction 564(b)(1) of the Act, 21 U.S.C. section 360bbb-3(b)(1), unless the authorization is terminated or revoked sooner.  Performed at [facility], [place] [address] [place], [place] [address]      Labs: CBC: Recent Labs  Lab 08/13/20 1613 08/14/20 0254 08/15/20 0058 08/16/20 0100  WBC 2.8* 2.4* 3.3* 4.1  NEUTROABS 1.6* 1.2*  --   --   HGB 10.5* 9.2* 9.7* 8.3*  HCT 33.8* 29.2* 30.3* 26.2*  MCV 90.4 90.4 86.8 87.0  PLT 89* 81* 96* 102*    Basic Metabolic Panel: Recent Labs  Lab 08/13/20 1613 08/14/20 0254 08/14/20 0255 08/15/20 0058 08/16/20 0100  NA 133* 131*  --  136 135  K 4.1 4.0  --  4.2 3.8  CL 104 103  --  106 105  CO2 22 19*  --  24 22  GLUCOSE 96 180*  --  127* 183*  BUN 34* 37*  --  33* 23  CREATININE 3.28* 3.20*  --  2.31* 1.96*  CALCIUM 8.7* 8.2*  --  8.6* 8.2*  MG  --   --  1.7  --   --     Liver Function Tests: Recent Labs  Lab 08/13/20 1613 08/14/20 0254 08/15/20 0058 08/16/20 0100  AST 123* 122* 96* 76*  ALT 94* 95* 78* 65*  ALKPHOS 73 73 78 68  BILITOT 0.9 0.7 0.9 0.4  PROT 6.7 6.4* 6.5 5.6*  ALBUMIN 3.2* 3.1* 2.9* 2.5*    CBG: Recent Labs  Lab 08/15/20 1320 08/15/20 1719 08/15/20 2154 08/16/20 0753 08/16/20 0841  GLUCAP 145* 97 187* 69* 92    Hgb A1c Recent Labs    08/14/20 0256  HGBA1C 7.7*     Urinalysis    Component Value Date/Time   COLORURINE AMBER (A) 08/14/2020 0123   APPEARANCEUR TURBID (A) 08/14/2020 0123   LABSPEC 1.015 08/14/2020 0123   PHURINE 5.0 08/14/2020 0123   GLUCOSEU NEGATIVE 08/14/2020 0123   HGBUR SMALL (A) 08/14/2020 0123   BILIRUBINUR NEGATIVE 08/14/2020 0123   KETONESUR NEGATIVE 08/14/2020 0123   PROTEINUR 100 (A) 08/14/2020 0123   NITRITE NEGATIVE 08/14/2020 0123   LEUKOCYTESUR MODERATE (A) 08/14/2020 0123      Time  coordinating discharge: 25 minutes  SIGNED:  Vernell Leep, MD, FACP, [facility]. [facility]  To contact the attending provider between 7A-7P or the covering provider during after hours 7P-7A, please log into the web site [email or web address] and access using universal [place] password for that web site. If you do not have the password, please call the hospital operator.

## 2020-08-16 NOTE — Progress Notes (Signed)
Stephanie Manning notified of 2degree type 1 HB and periods of Ventricular trigeminy. Will continue to monitor.

## 2020-08-16 NOTE — Plan of Care (Signed)

## 2020-08-16 NOTE — Progress Notes (Signed)
AVS given and reviewed with pt. Medications discussed. All questions answered to satisfaction. Pt verbalized understanding of information given. Pt escorted off the unit with all belongings via wheelchair by staff member.  

## 2020-08-18 ENCOUNTER — Telehealth (HOSPITAL_COMMUNITY): Payer: Self-pay | Admitting: Cardiology

## 2020-08-18 NOTE — Telephone Encounter (Signed)
Pt was d/c from hosp. On Saturday, first available is 06/22 w/[facility], pt request Dr [name], please advise

## 2020-08-19 ENCOUNTER — Other Ambulatory Visit: Payer: Self-pay

## 2020-08-19 ENCOUNTER — Ambulatory Visit (HOSPITAL_COMMUNITY)
Admission: RE | Admit: 2020-08-19 | Discharge: 2020-08-19 | Disposition: A | Discharge status: Home / Self Care | Payer: Medicare Other | Source: Ambulatory Visit | Attending: Cardiology | Admitting: Cardiology

## 2020-08-19 VITALS — BP 124/58 | HR 87 | Wt 239.8 lb

## 2020-08-19 DIAGNOSIS — Z79899 Other long term (current) drug therapy: Secondary | ICD-10-CM | POA: Diagnosis not present

## 2020-08-19 DIAGNOSIS — Z7982 Long term (current) use of aspirin: Secondary | ICD-10-CM | POA: Insufficient documentation

## 2020-08-19 DIAGNOSIS — I255 Ischemic cardiomyopathy: Secondary | ICD-10-CM | POA: Insufficient documentation

## 2020-08-19 DIAGNOSIS — I25118 Atherosclerotic heart disease of native coronary artery with other forms of angina pectoris: Secondary | ICD-10-CM | POA: Diagnosis not present

## 2020-08-19 DIAGNOSIS — D509 Iron deficiency anemia, unspecified: Secondary | ICD-10-CM | POA: Insufficient documentation

## 2020-08-19 DIAGNOSIS — R7401 Elevation of levels of liver transaminase levels: Secondary | ICD-10-CM | POA: Insufficient documentation

## 2020-08-19 DIAGNOSIS — Z955 Presence of coronary angioplasty implant and graft: Secondary | ICD-10-CM | POA: Insufficient documentation

## 2020-08-19 DIAGNOSIS — I25110 Atherosclerotic heart disease of native coronary artery with unstable angina pectoris: Secondary | ICD-10-CM | POA: Diagnosis not present

## 2020-08-19 DIAGNOSIS — I5042 Chronic combined systolic (congestive) and diastolic (congestive) heart failure: Secondary | ICD-10-CM | POA: Diagnosis not present

## 2020-08-19 DIAGNOSIS — R053 Chronic cough: Secondary | ICD-10-CM | POA: Diagnosis not present

## 2020-08-19 DIAGNOSIS — I130 Hypertensive heart and chronic kidney disease with heart failure and stage 1 through stage 4 chronic kidney disease, or unspecified chronic kidney disease: Secondary | ICD-10-CM | POA: Insufficient documentation

## 2020-08-19 DIAGNOSIS — Z888 Allergy status to other drugs, medicaments and biological substances status: Secondary | ICD-10-CM | POA: Diagnosis not present

## 2020-08-19 DIAGNOSIS — N179 Acute kidney failure, unspecified: Secondary | ICD-10-CM | POA: Insufficient documentation

## 2020-08-19 DIAGNOSIS — Z881 Allergy status to other antibiotic agents status: Secondary | ICD-10-CM | POA: Diagnosis not present

## 2020-08-19 DIAGNOSIS — Z794 Long term (current) use of insulin: Secondary | ICD-10-CM | POA: Diagnosis not present

## 2020-08-19 DIAGNOSIS — N183 Chronic kidney disease, stage 3 unspecified: Secondary | ICD-10-CM | POA: Insufficient documentation

## 2020-08-19 DIAGNOSIS — Z880 Allergy status to penicillin: Secondary | ICD-10-CM | POA: Diagnosis not present

## 2020-08-19 DIAGNOSIS — I252 Old myocardial infarction: Secondary | ICD-10-CM | POA: Diagnosis not present

## 2020-08-19 DIAGNOSIS — Z7902 Long term (current) use of antithrombotics/antiplatelets: Secondary | ICD-10-CM | POA: Diagnosis not present

## 2020-08-19 DIAGNOSIS — Z885 Allergy status to narcotic agent status: Secondary | ICD-10-CM | POA: Insufficient documentation

## 2020-08-19 DIAGNOSIS — E1122 Type 2 diabetes mellitus with diabetic chronic kidney disease: Secondary | ICD-10-CM | POA: Diagnosis not present

## 2020-08-19 LAB — COMPREHENSIVE METABOLIC PANEL
ALT: 75 U/L — ABNORMAL HIGH (ref 0–44)
AST: 64 U/L — ABNORMAL HIGH (ref 15–41)
Albumin: 2.8 g/dL — ABNORMAL LOW (ref 3.5–5.0)
Alkaline Phosphatase: 74 U/L (ref 38–126)
Anion gap: 8 (ref 5–15)
BUN: 30 mg/dL — ABNORMAL HIGH (ref 8–23)
CO2: 21 mmol/L — ABNORMAL LOW (ref 22–32)
Calcium: 8.2 mg/dL — ABNORMAL LOW (ref 8.9–10.3)
Chloride: 108 mmol/L (ref 98–111)
Creatinine, Ser: 2.27 mg/dL — ABNORMAL HIGH (ref 0.44–1.00)
GFR, Estimated: 24 mL/min — ABNORMAL LOW (ref >60)
Glucose, Bld: 196 mg/dL — ABNORMAL HIGH (ref 70–99)
Potassium: 4.1 mmol/L (ref 3.5–5.1)
Sodium: 137 mmol/L (ref 135–145)
Total Bilirubin: 0.6 mg/dL (ref 0.3–1.2)
Total Protein: 6.7 g/dL (ref 6.5–8.1)

## 2020-08-19 LAB — BRAIN NATRIURETIC PEPTIDE: B Natriuretic Peptide: 1357 pg/mL — ABNORMAL HIGH (ref 0.0–100.0)

## 2020-08-19 MED ORDER — FUROSEMIDE 20 MG PO TABS: 20.0000 mg | ORAL_TABLET | Freq: Every day | ORAL | 11 refills | Status: DC

## 2020-08-19 MED ORDER — SPIRONOLACTONE 25 MG PO TABS: 25.0000 mg | ORAL_TABLET | Freq: Every day | ORAL | 3 refills | Status: DC

## 2020-08-19 MED ORDER — ENTRESTO 24-26 MG PO TABS: 1.0000 | ORAL_TABLET | Freq: Two times a day (BID) | ORAL | 11 refills | Status: DC

## 2020-08-19 NOTE — Progress Notes (Signed)
ReDS Vest / Clip - 08/19/20 1500      ReDS Vest / Clip   Station Marker C    Ruler Value 30.5    ReDS Value Range Moderate volume overload    ReDS Actual Value 38

## 2020-08-19 NOTE — Progress Notes (Signed)
Date:  08/19/2020   ID:  Stephanie Manning, DOB Jul 14, 1960, MRN [id number]  Provider location: [facility] Type of Visit: Established patient  PCP:  [name], MD  HF Cardiology: Dr. [name]   History of Present Illness: Stephanie Manning is a 60 y.o. female who has a history of CAD and ischemic cardiomyopathy.  In 12/18, she had anterior MI with DES to totally occluded mid LAD (culprit). She returned in 1/19 for staged DES to 80% RCA stenosis. Echo in 12/18 showed EF 30-35%.  Repeat echo in 3/19 showed EF stable at 30-35%.    She was admitted in 7/19 with chest pain, concern for unstable angina.  Culprit of symptoms was thought to be 95% stenosis in a small PLV vessel, medical treatment planned.   She saw Dr. Rayann Heman and decided against ICD.   Echo was done in 6/20 showing stable EF 30-35%.   She was unable to tolerate Bidil 1/2 tablet tid due to lightheadedness. She was unable to tolerate dapagliflozin.   Echo in 11/21 showed EF 40-45% with apical septal and apical akinesis, normal RV.   Patient was admitted in 6/22 with poor po intake, feeling "sick."  She was noted to be dehydrated with hypotension, orthostasis, and AKI.  She was hydrated and Entresto and spironolactone were held.  Creatinine was as high as 3.28.   She returns for followup of CHF and CAD after recent admission.  She has developed a chronic cough.  She is significantly more short of breath, dyspneic walking 20-30 feet.  She is short of breath walking around the house.  She used a wheelchair to get in the office today.  No chest pain.  No lightheadedness.  No palpitations.    REDS clip 38%  ECG (personally reviewed): NSR, PVCs, lateral TWIs  Labs (2/19): K 4.7, creatinine 1.48, hgb 8.4 Labs (5/19): LDL 65, K 4.7, creatinine 1.66 Labs (8/19): K 5, creatinine 1.78  Labs (9/19): K 3.8, creatinine 1.6 Labs (12/19): K 3.9, creatinine 1.59 Labs (3/20): LDL 73, HDL 41 Labs  (5/20): K 4.1, creatinine 1.59, LDL 73 Labs (9/20): K 4.2, creatinine 1.6 Labs (11/20): K 4.1, creatinine 1.7, LDL 50, HDL 32 Labs (3/21): K 4.1, creatinine 1.67 Labs (8/21): LDL 76, HDL 30, K 4, creatinine 1.86 Labs (12/21): LDL 41, HDL 25, TSH normal, K 4, creatinine 1.74 Labs (6/22): creatinine 3.28 => 1.96, K 3.8, AST 76, ALT 65  PMH: 1. Type II diabetes 2. CKD stage 3.  3. Hyperlipidemia 4. HTN 5. Fe deficiency anemia 6. GERD 7. CAD: Anterior MI in 12/18 with DES to 100% stenosed mid LAD (culprit), also with CTO of distal LAD, 80% pRCA, 85% mLCx/90% dLCx, 90% OM3.  - 1/19 staged PCI of proximal RCA.  - Admitted with unstable angina 7/19.  LHC: 75% stenosis proximal-mid LCx, 60% OM1, total occlusion of the distal LAD, patent mid LAD stent, patent RCA stents, 95% stenosis in small PLV branch (likely culprit).  8. Chronic systolic CHF: Ischemic cardiomyopathy.  - Echo (12/18): EF 30-35%.  - Echo (3/19): EF 30-35%, WMAs in LAD territory, RV mildly dilated.  - Echo (6/20): EF 30-35%, mild LV dilation, normal RV size and systolic function.  - Echo (11/21): EF 40-45% with apical septal and apical akinesis, normal RV.  Current Outpatient Medications  Medication Sig Dispense Refill  . Accu-Chek Softclix Lancets lancets Use as instructed 100 each 2  . aspirin 81 MG chewable tablet Chew  1 tablet (81 mg total) by mouth daily.    Marland Kitchen atorvastatin (LIPITOR) 80 MG tablet Take 1 tablet (80 mg total) by mouth daily at 6 PM. 90 tablet 3  . blood glucose meter kit and supplies Dispense based on patient and insurance preference. Use up to four times daily as directed. (FOR ICD-10 E11.65) Pt prefers One touch mini 1 each 5  . carvedilol (COREG) 12.5 MG tablet Take 1 tablet (12.5 mg total) by mouth 2 (two) times daily with a meal. 180 tablet 3  . ELDERBERRY PO Take 2 tablets by mouth every other day.     . ferrous sulfate 300 (60 Fe) MG/5ML syrup Take 300 mg by mouth daily with breakfast.    .  furosemide (LASIX) 20 MG tablet Take 1 tablet (20 mg total) by mouth daily. 30 tablet 11  . gabapentin (NEURONTIN) 300 MG capsule Take 300 mg by mouth at bedtime.     Marland Kitchen glucose blood (ACCU-CHEK GUIDE) test strip TEST BLOOD SUGAR FOUR TIMES A DAY BEFORE MEALS AND AT BEDTIME 150 strip 3  . insulin detemir (LEVEMIR FLEXTOUCH) 100 UNIT/ML FlexPen Inject 30 Units into the skin at bedtime. As discussed with you, based on your home fingerstick blood sugar checks, gradually increase back up to your previous dose of 50 units daily at bedtime over the next 3 to 4 days.    . Multiple Vitamin (MULTIVITAMIN WITH MINERALS) TABS tablet Take 1 tablet by mouth daily.    . nitroGLYCERIN (NITROSTAT) 0.4 MG SL tablet Place 1 tablet (0.4 mg total) under the tongue every 5 (five) minutes as needed for chest pain. 25 tablet 3  . pantoprazole (PROTONIX) 40 MG tablet Take 1 tablet (40 mg total) by mouth 2 (two) times daily. 180 tablet 3  . sacubitril-valsartan (ENTRESTO) 24-26 MG Take 1 tablet by mouth 2 (two) times daily. 60 tablet 11  . Saline 0.2 % SOLN Place 1 spray into both nostrils as needed (congestion).     . ticagrelor (BRILINTA) 60 MG TABS tablet Take 1 tablet (60 mg total) by mouth 2 (two) times daily. 180 tablet 3  . tiZANidine (ZANAFLEX) 4 MG tablet Take 4 mg by mouth at bedtime.    Marland Kitchen VITAMIN D PO Take 1 tablet by mouth 2 (two) times a week. Mondays and Wednesdays    . spironolactone (ALDACTONE) 25 MG tablet Take 1 tablet (25 mg total) by mouth daily. 90 tablet 3   No current facility-administered medications for this encounter.    Allergies:   Hydralazine, Bidil [isosorb dinitrate-hydralazine], Ciprofloxacin, Farxiga [dapagliflozin], Propoxycaine, Amoxicillin, Hydrocodone, Lisinopril, Meloxicam, Strawberry extract, Tape, and Toradol [ketorolac tromethamine]   Social History:  The patient  reports that she has never smoked. She has never used smokeless tobacco. She reports that she does not drink alcohol and  does not use drugs.   Family History:  The patient's family history includes Diabetes in her mother; Heart disease in her mother; Hypertension in her mother; Stroke in her mother.   ROS:  Please see the history of present illness.   All other systems are personally reviewed and negative.   Exam:   BP (!) 124/58   Pulse 87   Wt 108.8 kg (239 lb 12.8 oz)   SpO2 92%   BMI 36.46 kg/m  General: NAD Neck: JVP 10-12 cm, no thyromegaly or thyroid nodule.  Lungs: Clear to auscultation bilaterally with normal respiratory effort. CV: Nondisplaced PMI.  Heart regular S1/S2, no S3/S4, no murmur.  1+  ankle edema.  No carotid bruit.  Normal pedal pulses.  Abdomen: Soft, nontender, no hepatosplenomegaly, no distention.  Skin: Intact without lesions or rashes.  Neurologic: Alert and oriented x 3.  Psych: Normal affect. Extremities: No clubbing or cyanosis.  HEENT: Normal.   Recent Labs: 02/29/2020: TSH 2.260 08/14/2020: Magnesium 1.7 08/16/2020: Hemoglobin 8.3; Platelets 102 08/19/2020: ALT 75; B Natriuretic Peptide 1,357.0; BUN 30; Creatinine, Ser 2.27; Potassium 4.1; Sodium 137  Personally reviewed   Wt Readings from Last 3 Encounters:  08/19/20 108.8 kg (239 lb 12.8 oz)  08/16/20 107.4 kg (236 lb 12.4 oz)  07/10/20 103.9 kg (229 lb)    ASSESSMENT AND PLAN:  1. CAD: Anterior MI in 12/18 with DES to proximal LAD (culprit) followed by staged DES to proximal RCA.  7/19 admission with unstable angina, likely culprit was 95% stenosis in small PLV. This was managed conservatively.  No recent chest pain.       - Continue atorvastatin   - Continue ASA 81 daily and ticagrelor 60 mg bid.  2. Chronic systolic CHF: Ischemic cardiomyopathy. Echo in 11/21 with EF 40-45%.  She is volume overloaded after recent admission with AKI/dehydration, she was given IV fluids.  NYHA class III symptoms, significantly worse than her baseline.   - Continue Coreg 12.5 mg bid.     Stephanie Manning has been on hold.  She can start  low dose Entresto 24/26 bid if creatinine is stable today.    - Start Lasix 40 mg daily x 2 days then 20 mg daily after that.  BMET today and in 7 days.  - Restart spironolactone 25 mg daily.   - She was unable to tolerate Bidil at lowest does due to lightheadedness.   - She was unable to tolerate dapagliflozin.   - She is now out of ICD range. When EF was lower, she refused ICD.    3. CKD: stage 3.  Recent AKI.  - BMET today.  4. Fe deficiency anemia: Long-standing.  She has had IV Fe infusions.  5. Type II DM: Unable to tolerate dapagliflozin.  6. Elevated LFTs: Noted at 6/22 admission.  Repeat LFTs today.   Followup 1 week with APP.  Signed, Loralie Champagne, MD  08/19/2020  [facility] [address] [facility] [place] [address] [phone] (office) [phone] (fax)

## 2020-08-19 NOTE — Patient Instructions (Addendum)
EKG done today.   Labs done today. We will contact you only if your labs are abnormal.  INCREASE lasix 40mg  (2 tablets) by mouth daily for 2 days and then decrease 20mg  (1 tablet) by mouth daily.   RESTART Spironolactone 25mg  (1 tablet) by mouth daily.   RESTART Entresto 24-26mg  (1 tablet) by mouth 2 times daily.  No other medication changes were made. Please continue all current medications as prescribed.  Your physician recommends that you schedule a follow-up appointment in: 1 week with our [facility] here in our office.  If you have any questions or concerns before your next appointment please send [name] a message through [place] or call our office at [phone].    TO LEAVE A MESSAGE FOR THE NURSE SELECT OPTION 2, PLEASE LEAVE A MESSAGE INCLUDING: . YOUR NAME . DATE OF BIRTH . CALL BACK NUMBER . REASON FOR CALL**this is important as we prioritize the call backs  YOU WILL RECEIVE A CALL BACK THE SAME DAY AS LONG AS YOU CALL BEFORE 4:00 PM   Do the following things EVERYDAY: 1) Weigh yourself in the morning before breakfast. Write it down and keep it in a log. 2) Take your medicines as prescribed 3) Eat low salt foods--Limit salt (sodium) to 2000 mg per day.  4) Stay as active as you can everyday 5) Limit all fluids for the day to less than 2 liters   At the [facility], you and your health needs are our priority. As part of our continuing mission to provide you with exceptional heart care, we have created designated Provider Care Teams. These Care Teams include your primary Cardiologist (physician) and Advanced Practice Providers (APPs- Physician Assistants and Nurse Practitioners) who all work together to provide you with the care you need, when you need it.   You may see any of the following providers on your designated Care Team at your next follow up: Marland Kitchen Dr Glori Bickers . Dr Loralie Champagne . Darrick Grinder, NP . Lyda Jester, PA . Audry Riles,  PharmD   Please be sure to bring in all your medications bottles to every appointment.

## 2020-08-20 ENCOUNTER — Other Ambulatory Visit: Payer: Self-pay | Admitting: Cardiology

## 2020-08-20 ENCOUNTER — Telehealth (HOSPITAL_COMMUNITY): Payer: Self-pay

## 2020-08-20 ENCOUNTER — Other Ambulatory Visit (HOSPITAL_COMMUNITY): Payer: Self-pay | Admitting: *Deleted

## 2020-08-20 ENCOUNTER — Encounter (HOSPITAL_COMMUNITY): Payer: Self-pay

## 2020-08-20 MED ORDER — TICAGRELOR 60 MG PO TABS: 60.0000 mg | ORAL_TABLET | Freq: Two times a day (BID) | ORAL | 3 refills | Status: DC

## 2020-08-20 NOTE — Telephone Encounter (Signed)
-----   Message from [name], MD sent at 08/20/2020  4:29 PM EDT ----- Call today.  Do NOT restart on Entresto (stay off Entresto). No changes to other meds.  Needs BMET next week.  Creatinine is still elevated.

## 2020-08-20 NOTE — Telephone Encounter (Signed)
Patient advised and verbalized understanding. Med list updated to reflect changes, patient will have repeat bloodwork done at 6/14 appt.

## 2020-08-21 ENCOUNTER — Encounter (HOSPITAL_COMMUNITY): Payer: Self-pay

## 2020-08-22 ENCOUNTER — Other Ambulatory Visit: Payer: Self-pay

## 2020-08-24 NOTE — Progress Notes (Signed)
Error

## 2020-08-26 ENCOUNTER — Other Ambulatory Visit: Payer: Self-pay

## 2020-08-26 ENCOUNTER — Ambulatory Visit (HOSPITAL_COMMUNITY)
Admission: RE | Admit: 2020-08-26 | Discharge: 2020-08-26 | Disposition: A | Discharge status: Home / Self Care | Payer: Medicare Other | Source: Ambulatory Visit | Attending: Family Medicine | Admitting: Family Medicine

## 2020-08-26 VITALS — BP 140/74 | HR 84 | Wt 225.8 lb

## 2020-08-26 DIAGNOSIS — Z79899 Other long term (current) drug therapy: Secondary | ICD-10-CM | POA: Insufficient documentation

## 2020-08-26 DIAGNOSIS — D509 Iron deficiency anemia, unspecified: Secondary | ICD-10-CM | POA: Insufficient documentation

## 2020-08-26 DIAGNOSIS — I252 Old myocardial infarction: Secondary | ICD-10-CM | POA: Insufficient documentation

## 2020-08-26 DIAGNOSIS — E1122 Type 2 diabetes mellitus with diabetic chronic kidney disease: Secondary | ICD-10-CM | POA: Insufficient documentation

## 2020-08-26 DIAGNOSIS — I130 Hypertensive heart and chronic kidney disease with heart failure and stage 1 through stage 4 chronic kidney disease, or unspecified chronic kidney disease: Secondary | ICD-10-CM | POA: Diagnosis present

## 2020-08-26 DIAGNOSIS — I5042 Chronic combined systolic (congestive) and diastolic (congestive) heart failure: Secondary | ICD-10-CM | POA: Diagnosis not present

## 2020-08-26 DIAGNOSIS — Z833 Family history of diabetes mellitus: Secondary | ICD-10-CM | POA: Insufficient documentation

## 2020-08-26 DIAGNOSIS — Z955 Presence of coronary angioplasty implant and graft: Secondary | ICD-10-CM | POA: Diagnosis not present

## 2020-08-26 DIAGNOSIS — I2510 Atherosclerotic heart disease of native coronary artery without angina pectoris: Secondary | ICD-10-CM | POA: Insufficient documentation

## 2020-08-26 DIAGNOSIS — Z7902 Long term (current) use of antithrombotics/antiplatelets: Secondary | ICD-10-CM | POA: Insufficient documentation

## 2020-08-26 DIAGNOSIS — N179 Acute kidney failure, unspecified: Secondary | ICD-10-CM | POA: Insufficient documentation

## 2020-08-26 DIAGNOSIS — I25118 Atherosclerotic heart disease of native coronary artery with other forms of angina pectoris: Secondary | ICD-10-CM | POA: Diagnosis not present

## 2020-08-26 DIAGNOSIS — N183 Chronic kidney disease, stage 3 unspecified: Secondary | ICD-10-CM | POA: Insufficient documentation

## 2020-08-26 DIAGNOSIS — I5022 Chronic systolic (congestive) heart failure: Secondary | ICD-10-CM | POA: Insufficient documentation

## 2020-08-26 DIAGNOSIS — Z7982 Long term (current) use of aspirin: Secondary | ICD-10-CM | POA: Insufficient documentation

## 2020-08-26 DIAGNOSIS — Z794 Long term (current) use of insulin: Secondary | ICD-10-CM | POA: Insufficient documentation

## 2020-08-26 DIAGNOSIS — E785 Hyperlipidemia, unspecified: Secondary | ICD-10-CM | POA: Insufficient documentation

## 2020-08-26 DIAGNOSIS — N184 Chronic kidney disease, stage 4 (severe): Secondary | ICD-10-CM | POA: Diagnosis not present

## 2020-08-26 DIAGNOSIS — Z8249 Family history of ischemic heart disease and other diseases of the circulatory system: Secondary | ICD-10-CM | POA: Insufficient documentation

## 2020-08-26 DIAGNOSIS — I255 Ischemic cardiomyopathy: Secondary | ICD-10-CM | POA: Diagnosis not present

## 2020-08-26 DIAGNOSIS — R7989 Other specified abnormal findings of blood chemistry: Secondary | ICD-10-CM | POA: Diagnosis not present

## 2020-08-26 DIAGNOSIS — Z888 Allergy status to other drugs, medicaments and biological substances status: Secondary | ICD-10-CM | POA: Diagnosis not present

## 2020-08-26 MED ORDER — FUROSEMIDE 20 MG PO TABS: 20.0000 mg | ORAL_TABLET | ORAL | 11 refills | Status: DC

## 2020-08-26 NOTE — Progress Notes (Signed)
ReDS Vest / Clip - 08/26/20 1400       ReDS Vest / Clip   Station Marker D    Ruler Value 36    ReDS Value Range Low volume    ReDS Actual Value 29    Anatomical Comments sitting

## 2020-08-26 NOTE — Progress Notes (Signed)
Date:  08/26/2020   ID:  [name], DOB 1960-04-04, MRN [id number]  Provider location: [facility] Type of Visit: Established patient  PCP:  [name], MD  HF Cardiology: Dr. [name]   History of Present Illness: Stephanie Manning is a 60 y.o. female who has a history of CAD and ischemic cardiomyopathy.  In 12/18, she had anterior MI with DES to totally occluded mid LAD (culprit). She returned in 1/19 for staged DES to 80% RCA stenosis. Echo in 12/18 showed EF 30-35%.  Repeat echo in 3/19 showed EF stable at 30-35%.     She was admitted in 7/19 with chest pain, concern for unstable angina.  Culprit of symptoms was thought to be 95% stenosis in a small PLV vessel, medical treatment planned.    She saw Dr. Rayann Heman and decided against ICD.   Echo was done in 6/20 showing stable EF 30-35%.    She was unable to tolerate Bidil 1/2 tablet tid due to lightheadedness. She was unable to tolerate dapagliflozin.   Echo in 11/21 showed EF 40-45% with apical septal and apical akinesis, normal RV.   Patient was admitted in 6/22 with poor po intake, feeling "sick."  She had also been taking an over the counter herbal medication called immuno shield whci had blood pressure lowering effect.  She was noted to be dehydrated with hypotension, orthostasis, and AKI.  She was hydrated and Entresto and spironolactone were held.  Creatinine was as high as 3.28.   Today she returns for HF follow up. She was seen by Dr [name] 6/7 in the HF clinic and was volume overloaded. Lasix was increased to 40 mg daily  x 2 days then 20 mg daily after that. Overall feeling much better. She is hesistant walk much without someone with her due to past orthostasis. No longer having dizziness. Mild SOB with exertion. Denies PND/Orthopnea. Appetite ok. No fever or chills. Weight at home has gone down to 232 pounds. SBP at home 130-140s. Taking all medications.  Intolerant SGLT2i and BIDIL.  Hypotension and AKI   REDS clip -  27%    Labs (2/19): K 4.7, creatinine 1.48, hgb 8.4 Labs (5/19): LDL 65, K 4.7, creatinine 1.66 Labs (8/19): K 5, creatinine 1.78  Labs (9/19): K 3.8, creatinine 1.6 Labs (12/19): K 3.9, creatinine 1.59 Labs (3/20): LDL 73, HDL 41 Labs (5/20): K 4.1, creatinine 1.59, LDL 73 Labs (9/20): K 4.2, creatinine 1.6 Labs (11/20): K 4.1, creatinine 1.7, LDL 50, HDL 32 Labs (3/21): K 4.1, creatinine 1.67 Labs (8/21): LDL 76, HDL 30, K 4, creatinine 1.86 Labs (12/21): LDL 41, HDL 25, TSH normal, K 4, creatinine 1.74 Labs (6/22): creatinine 3.28 => 1.96, K 3.8, AST 76, ALT 65 Labs 08/19/20: creatinine 2.3 K 4.1 Labs 08/22/20: creatinine 1.9 K 4.2    PMH: 1. Type II diabetes 2. CKD stage 3.  3. Hyperlipidemia 4. HTN 5. Fe deficiency anemia 6. GERD 7. CAD: Anterior MI in 12/18 with DES to 100% stenosed mid LAD (culprit), also with CTO of distal LAD, 80% pRCA, 85% mLCx/90% dLCx, 90% OM3.  - 1/19 staged PCI of proximal RCA.  - Admitted with unstable angina 7/19.  LHC: 75% stenosis proximal-mid LCx, 60% OM1, total occlusion of the distal LAD, patent mid LAD stent, patent RCA stents, 95% stenosis in small PLV branch (likely culprit).  8. Chronic systolic CHF: Ischemic cardiomyopathy.  - Echo (12/18): EF 30-35%.  - Echo (  3/19): EF 30-35%, WMAs in LAD territory, RV mildly dilated.  - Echo (6/20): EF 30-35%, mild LV dilation, normal RV size and systolic function.  - Echo (11/21): EF 40-45% with apical septal and apical akinesis, normal RV.  Current Outpatient Medications  Medication Sig Dispense Refill   Accu-Chek Softclix Lancets lancets Use as instructed 100 each 2   aspirin 81 MG chewable tablet Chew 1 tablet (81 mg total) by mouth daily.     atorvastatin (LIPITOR) 80 MG tablet Take 1 tablet (80 mg total) by mouth daily at 6 PM. 90 tablet 3   blood glucose meter kit and supplies Dispense based on patient and insurance preference. Use up to four times daily as  directed. (FOR ICD-10 E11.65) Pt prefers One touch mini 1 each 5   carvedilol (COREG) 12.5 MG tablet Take 1 tablet (12.5 mg total) by mouth 2 (two) times daily with a meal. 180 tablet 3   ELDERBERRY PO Take 2 tablets by mouth every other day.      ferrous sulfate 300 (60 Fe) MG/5ML syrup Take 300 mg by mouth daily with breakfast.     furosemide (LASIX) 20 MG tablet Take 1 tablet (20 mg total) by mouth daily. 30 tablet 11   gabapentin (NEURONTIN) 300 MG capsule Take 300 mg by mouth at bedtime.      glucose blood (ACCU-CHEK GUIDE) test strip TEST BLOOD SUGAR FOUR TIMES A DAY BEFORE MEALS AND AT BEDTIME 150 strip 3   insulin detemir (LEVEMIR FLEXTOUCH) 100 UNIT/ML FlexPen Inject 30 Units into the skin at bedtime. As discussed with you, based on your home fingerstick blood sugar checks, gradually increase back up to your previous dose of 50 units daily at bedtime over the next 3 to 4 days.     Multiple Vitamin (MULTIVITAMIN WITH MINERALS) TABS tablet Take 1 tablet by mouth daily.     nitroGLYCERIN (NITROSTAT) 0.4 MG SL tablet Place 1 tablet (0.4 mg total) under the tongue every 5 (five) minutes as needed for chest pain. 25 tablet 3   pantoprazole (PROTONIX) 40 MG tablet Take 1 tablet (40 mg total) by mouth 2 (two) times daily. 180 tablet 3   Saline 0.2 % SOLN Place 1 spray into both nostrils as needed (congestion).      ticagrelor (BRILINTA) 60 MG TABS tablet Take 1 tablet (60 mg total) by mouth 2 (two) times daily. 180 tablet 3   tiZANidine (ZANAFLEX) 4 MG tablet Take 4 mg by mouth at bedtime.     VITAMIN D PO Take 1 tablet by mouth 2 (two) times a week. Mondays and Wednesdays     No current facility-administered medications for this encounter.    Allergies:   Hydralazine, Bidil [isosorb dinitrate-hydralazine], Ciprofloxacin, Farxiga [dapagliflozin], Propoxycaine, Amoxicillin, Hydrocodone, Lisinopril, Meloxicam, Strawberry extract, Tape, and Toradol [ketorolac tromethamine]   Social History:  The  patient  reports that she has never smoked. She has never used smokeless tobacco. She reports that she does not drink alcohol and does not use drugs.   Family History:  The patient's family history includes Diabetes in her mother; Heart disease in her mother; Hypertension in her mother; Stroke in her mother.   ROS:  Please see the history of present illness.   All other systems are personally reviewed and negative.   Exam:   BP 140/74   Pulse 84   Wt 102.4 kg   SpO2 96%   BMI 34.33 kg/m  Wt Readings from Last 3 Encounters:  08/26/20  102.4 kg  08/19/20 108.8 kg  08/16/20 107.4 kg   Reds Clip 29%  General:   No resp difficulty HEENT: normal Neck: supple. no JVD. Carotids 2+ bilat; no bruits. No lymphadenopathy or thryomegaly appreciated. Cor: PMI nondisplaced. Regular rate & rhythm. No rubs, gallops or murmurs. Lungs: clear Abdomen: soft, nontender, nondistended. No hepatosplenomegaly. No bruits or masses. Good bowel sounds. Extremities: no cyanosis, clubbing, rash, edema Neuro: alert & orientedx3, cranial nerves grossly intact. moves all 4 extremities w/o difficulty. Affect pleasant  Recent Labs: 02/29/2020: TSH 2.260 08/14/2020: Magnesium 1.7 08/16/2020: Hemoglobin 8.3; Platelets 102 08/19/2020: ALT 75; B Natriuretic Peptide 1,357.0; BUN 30; Creatinine, Ser 2.27; Potassium 4.1; Sodium 137  Personally reviewed   Wt Readings from Last 3 Encounters:  08/26/20 102.4 kg  08/19/20 108.8 kg  08/16/20 107.4 kg    ASSESSMENT AND PLAN:  1. CAD: Anterior MI in 12/18 with DES to proximal LAD (culprit) followed by staged DES to proximal RCA.  7/19 admission with unstable angina, likely culprit was 95% stenosis in small PLV. This was managed conservatively. - No chest pain.     - Continue atorvastatin   - Continue ASA 81 daily and ticagrelor 60 mg bid.  2. Chronic systolic CHF: Ischemic cardiomyopathy. Echo in 11/21 with EF 40-45%.   NYHA III. Reds Clip 27%. Volume status uh improved with  addition of lasix. She wants to cut back lasix. I have asked her to cut lasix back to 20 mg M-W-F and can hold for weight < 232 pounds.  - Continue Coreg 12.5 mg bid.     - off entresto/spiro  due to AKI -- She was unable to tolerate Bidil at lowest does due to lightheadedness.   - She was unable to tolerate dapagliflozin.   - She is now out of ICD range. When EF was lower, she refused ICD.    - Discussed limiting fluids to < 2 liters and low salt food choices.  3. CKD: stage 3.  Creatinine baseline 1.6.-1.9 - Creatinine 08/22/20 1.9.  -Discussed avoiding NSAIDs.  4. Fe deficiency anemia: Long-standing.  She has had IV Fe infusions.  5. Type II DM: Unable to tolerate dapagliflozin.  6. Elevated LFTs: Noted at 6/22 admission.    Follow up in 6-8 weeks with Dr [name].  Greater than 50% of the (total minutes 25) visit spent in counseling/coordination of care regarding the above.  Jeanmarie Hubert, NP  08/26/2020  [facility] [address] [facility] and [place] [address] [phone] (office) [phone] (fax)

## 2020-08-26 NOTE — Patient Instructions (Signed)
DECREASE Lasix to 20 mg every Monday, Wednesday, and Friday  Your physician recommends that you schedule a follow-up appointment in: 6 weeks with Dr [name]  Do the following things EVERYDAY: Weigh yourself in the morning before breakfast. Write it down and keep it in a log. Take your medicines as prescribed Eat low salt foods--Limit salt (sodium) to 2000 mg per day.  Stay as active as you can everyday Limit all fluids for the day to less than 2 liters  At the [facility], you and your health needs are our priority. As part of our continuing mission to provide you with exceptional heart care, we have created designated Provider Care Teams. These Care Teams include your primary Cardiologist (physician) and Advanced Practice Providers (APPs- Physician Assistants and Nurse Practitioners) who all work together to provide you with the care you need, when you need it.   You may see any of the following providers on your designated Care Team at your next follow up: Dr Glori Bickers Dr Loralie Champagne Dr Patrice Paradise, NP Lyda Jester, [place] Ginnie Smart Audry Riles, PharmD   Please be sure to bring in all your medications bottles to every appointment.   If you have any questions or concerns before your next appointment please send [name] a message through [place] or call our office at [phone].    TO LEAVE A MESSAGE FOR THE NURSE SELECT OPTION 2, PLEASE LEAVE A MESSAGE INCLUDING: YOUR NAME DATE OF BIRTH CALL BACK NUMBER REASON FOR CALL**this is important as we prioritize the call backs  YOU WILL RECEIVE A CALL BACK THE SAME DAY AS LONG AS YOU CALL BEFORE 4:00 PM

## 2020-10-17 ENCOUNTER — Ambulatory Visit (HOSPITAL_COMMUNITY)
Admission: RE | Admit: 2020-10-17 | Discharge: 2020-10-17 | Disposition: A | Discharge status: Home / Self Care | Payer: Medicare Other | Source: Ambulatory Visit | Attending: Cardiology | Admitting: Cardiology

## 2020-10-17 ENCOUNTER — Other Ambulatory Visit: Payer: Self-pay

## 2020-10-17 ENCOUNTER — Encounter (HOSPITAL_COMMUNITY): Payer: Self-pay | Admitting: Cardiology

## 2020-10-17 VITALS — BP 148/72 | HR 80 | Wt 217.0 lb

## 2020-10-17 DIAGNOSIS — Z7982 Long term (current) use of aspirin: Secondary | ICD-10-CM | POA: Diagnosis not present

## 2020-10-17 DIAGNOSIS — I2510 Atherosclerotic heart disease of native coronary artery without angina pectoris: Secondary | ICD-10-CM | POA: Diagnosis not present

## 2020-10-17 DIAGNOSIS — E785 Hyperlipidemia, unspecified: Secondary | ICD-10-CM | POA: Diagnosis not present

## 2020-10-17 DIAGNOSIS — I255 Ischemic cardiomyopathy: Secondary | ICD-10-CM | POA: Diagnosis not present

## 2020-10-17 DIAGNOSIS — D509 Iron deficiency anemia, unspecified: Secondary | ICD-10-CM | POA: Diagnosis not present

## 2020-10-17 DIAGNOSIS — E1122 Type 2 diabetes mellitus with diabetic chronic kidney disease: Secondary | ICD-10-CM | POA: Insufficient documentation

## 2020-10-17 DIAGNOSIS — I5022 Chronic systolic (congestive) heart failure: Secondary | ICD-10-CM | POA: Insufficient documentation

## 2020-10-17 DIAGNOSIS — I5042 Chronic combined systolic (congestive) and diastolic (congestive) heart failure: Secondary | ICD-10-CM

## 2020-10-17 DIAGNOSIS — I130 Hypertensive heart and chronic kidney disease with heart failure and stage 1 through stage 4 chronic kidney disease, or unspecified chronic kidney disease: Secondary | ICD-10-CM | POA: Diagnosis not present

## 2020-10-17 DIAGNOSIS — Z880 Allergy status to penicillin: Secondary | ICD-10-CM | POA: Diagnosis not present

## 2020-10-17 DIAGNOSIS — Z833 Family history of diabetes mellitus: Secondary | ICD-10-CM | POA: Diagnosis not present

## 2020-10-17 DIAGNOSIS — N183 Chronic kidney disease, stage 3 unspecified: Secondary | ICD-10-CM | POA: Diagnosis not present

## 2020-10-17 DIAGNOSIS — Z794 Long term (current) use of insulin: Secondary | ICD-10-CM | POA: Insufficient documentation

## 2020-10-17 DIAGNOSIS — Z881 Allergy status to other antibiotic agents status: Secondary | ICD-10-CM | POA: Diagnosis not present

## 2020-10-17 DIAGNOSIS — I252 Old myocardial infarction: Secondary | ICD-10-CM | POA: Insufficient documentation

## 2020-10-17 DIAGNOSIS — Z888 Allergy status to other drugs, medicaments and biological substances status: Secondary | ICD-10-CM | POA: Insufficient documentation

## 2020-10-17 DIAGNOSIS — Z79899 Other long term (current) drug therapy: Secondary | ICD-10-CM | POA: Diagnosis not present

## 2020-10-17 DIAGNOSIS — Z8249 Family history of ischemic heart disease and other diseases of the circulatory system: Secondary | ICD-10-CM | POA: Diagnosis not present

## 2020-10-17 DIAGNOSIS — Z885 Allergy status to narcotic agent status: Secondary | ICD-10-CM | POA: Diagnosis not present

## 2020-10-17 DIAGNOSIS — Z7902 Long term (current) use of antithrombotics/antiplatelets: Secondary | ICD-10-CM | POA: Insufficient documentation

## 2020-10-17 LAB — BASIC METABOLIC PANEL
Anion gap: 8 (ref 5–15)
BUN: 19 mg/dL (ref 8–23)
CO2: 24 mmol/L (ref 22–32)
Calcium: 9.3 mg/dL (ref 8.9–10.3)
Chloride: 111 mmol/L (ref 98–111)
Creatinine, Ser: 1.75 mg/dL — ABNORMAL HIGH (ref 0.44–1.00)
GFR, Estimated: 33 mL/min — ABNORMAL LOW (ref >60)
Glucose, Bld: 121 mg/dL — ABNORMAL HIGH (ref 70–99)
Potassium: 3.9 mmol/L (ref 3.5–5.1)
Sodium: 143 mmol/L (ref 135–145)

## 2020-10-17 LAB — LIPID PANEL
Cholesterol: 112 mg/dL (ref 0–200)
HDL: 33 mg/dL — ABNORMAL LOW (ref >40)
LDL Cholesterol: 63 mg/dL (ref 0–99)
Total CHOL/HDL Ratio: 3.4 RATIO
Triglycerides: 78 mg/dL (ref <150)
VLDL: 16 mg/dL (ref 0–40)

## 2020-10-17 MED ORDER — ENTRESTO 24-26 MG PO TABS: 1.0000 | ORAL_TABLET | Freq: Two times a day (BID) | ORAL | 11 refills | Status: DC

## 2020-10-17 NOTE — Patient Instructions (Addendum)
Labs done today. We will contact you only if your labs are abnormal.  START Entresto 24-26(1 tablet) by mouth 2 times daily.   No other medication changes were made. Please continue all current medications as prescribed.  Your physician recommends that you schedule a follow-up appointment in: 3 weeks with our clinic pharmacy and in 2 months with an echo prior to your exam.   If you have any questions or concerns before your next appointment please send [name] a message through [place] or call our office at [phone].    TO LEAVE A MESSAGE FOR THE NURSE SELECT OPTION 2, PLEASE LEAVE A MESSAGE INCLUDING: YOUR NAME DATE OF BIRTH CALL BACK NUMBER REASON FOR CALL**this is important as we prioritize the call backs  YOU WILL RECEIVE A CALL BACK THE SAME DAY AS LONG AS YOU CALL BEFORE 4:00 PM   Do the following things EVERYDAY: Weigh yourself in the morning before breakfast. Write it down and keep it in a log. Take your medicines as prescribed Eat low salt foods--Limit salt (sodium) to 2000 mg per day.  Stay as active as you can everyday Limit all fluids for the day to less than 2 liters   At the [facility], you and your health needs are our priority. As part of our continuing mission to provide you with exceptional heart care, we have created designated Provider Care Teams. These Care Teams include your primary Cardiologist (physician) and Advanced Practice Providers (APPs- Physician Assistants and Nurse Practitioners) who all work together to provide you with the care you need, when you need it.   You may see any of the following providers on your designated Care Team at your next follow up: Dr Glori Bickers Dr Haynes Kerns, NP Lyda Jester, [place] Audry Riles, PharmD   Please be sure to bring in all your medications bottles to every appointment.

## 2020-10-17 NOTE — Progress Notes (Signed)
Date:  10/17/2020   ID:  [name], DOB [birth year], MRN [id number]  Provider location: [facility] Type of Visit: Established patient  PCP:  [name], MD  HF Cardiology: Dr. [name]   History of Present Illness: Stephanie Manning is a 60 y.o. female who has a history of CAD and ischemic cardiomyopathy.  In 12/18, she had anterior MI with DES to totally occluded mid LAD (culprit). She returned in 1/19 for staged DES to 80% RCA stenosis. Echo in 12/18 showed EF 30-35%.  Repeat echo in 3/19 showed EF stable at 30-35%.     She was admitted in 7/19 with chest pain, concern for unstable angina.  Culprit of symptoms was thought to be 95% stenosis in a small PLV vessel, medical treatment planned.    She saw Dr. Rayann Heman and decided against ICD.   Echo was done in 6/20 showing stable EF 30-35%.    She was unable to tolerate Bidil 1/2 tablet tid due to lightheadedness. She was unable to tolerate dapagliflozin.   Echo in 11/21 showed EF 40-45% with apical septal and apical akinesis, normal RV.   Patient was admitted in 6/22 with poor po intake, feeling "sick."  She was noted to be dehydrated with hypotension, orthostasis, and AKI.  She was hydrated and Entresto and spironolactone were held.  Creatinine was as high as 3.28.   She got IV fluid and developed volume overload requiring Lasix.   She returns for followup of CHF and CAD.  She is down another 8 lbs.  She is now only taking Lasix prn.  BP has been running high, generally in the 150s.  No dyspnea walking on flat ground or up stairs.  No chest pain.  No lightheadedness.  She has been moving into a new house and has been active.  Overall feeling good.     Labs (2/19): K 4.7, creatinine 1.48, hgb 8.4 Labs (5/19): LDL 65, K 4.7, creatinine 1.66 Labs (8/19): K 5, creatinine 1.78  Labs (9/19): K 3.8, creatinine 1.6 Labs (12/19): K 3.9, creatinine 1.59 Labs (3/20): LDL 73, HDL 41 Labs (5/20): K 4.1,  creatinine 1.59, LDL 73 Labs (9/20): K 4.2, creatinine 1.6 Labs (11/20): K 4.1, creatinine 1.7, LDL 50, HDL 32 Labs (3/21): K 4.1, creatinine 1.67 Labs (8/21): LDL 76, HDL 30, K 4, creatinine 1.86 Labs (12/21): LDL 41, HDL 25, TSH normal, K 4, creatinine 1.74 Labs (6/22): creatinine 3.28 => 1.96 => 1.94, K 3.8, AST 76, ALT 65   PMH: 1. Type II diabetes 2. CKD stage 3.  3. Hyperlipidemia 4. HTN 5. Fe deficiency anemia 6. GERD 7. CAD: Anterior MI in 12/18 with DES to 100% stenosed mid LAD (culprit), also with CTO of distal LAD, 80% pRCA, 85% mLCx/90% dLCx, 90% OM3.  - 1/19 staged PCI of proximal RCA.  - Admitted with unstable angina 7/19.  LHC: 75% stenosis proximal-mid LCx, 60% OM1, total occlusion of the distal LAD, patent mid LAD stent, patent RCA stents, 95% stenosis in small PLV branch (likely culprit).  8. Chronic systolic CHF: Ischemic cardiomyopathy.  - Echo (12/18): EF 30-35%.  - Echo (3/19): EF 30-35%, WMAs in LAD territory, RV mildly dilated.  - Echo (6/20): EF 30-35%, mild LV dilation, normal RV size and systolic function.  - Echo (11/21): EF 40-45% with apical septal and apical akinesis, normal RV.  Current Outpatient Medications  Medication Sig Dispense Refill   Accu-Chek Softclix Lancets lancets Use  as instructed 100 each 2   aspirin 81 MG chewable tablet Chew 1 tablet (81 mg total) by mouth daily.     blood glucose meter kit and supplies Dispense based on patient and insurance preference. Use up to four times daily as directed. (FOR ICD-10 E11.65) Pt prefers One touch mini 1 each 5   carvedilol (COREG) 12.5 MG tablet Take 1 tablet (12.5 mg total) by mouth 2 (two) times daily with a meal. 180 tablet 3   Dulaglutide (TRULICITY [place]) Inject into the skin every 14 (fourteen) days.     ELDERBERRY PO Take 2 tablets by mouth every other day.      ferrous sulfate 300 (60 Fe) MG/5ML syrup Take 300 mg by mouth daily with breakfast.     gabapentin (NEURONTIN) 300 MG capsule Take 300  mg by mouth at bedtime.      glucose blood (ACCU-CHEK GUIDE) test strip TEST BLOOD SUGAR FOUR TIMES A DAY BEFORE MEALS AND AT BEDTIME 150 strip 3   insulin detemir (LEVEMIR) 100 UNIT/ML injection Inject 50 Units into the skin daily.     Multiple Vitamin (MULTIVITAMIN WITH MINERALS) TABS tablet Take 1 tablet by mouth daily.     nitroGLYCERIN (NITROSTAT) 0.4 MG SL tablet Place 1 tablet (0.4 mg total) under the tongue every 5 (five) minutes as needed for chest pain. 25 tablet 3   pantoprazole (PROTONIX) 40 MG tablet Take 1 tablet (40 mg total) by mouth 2 (two) times daily. 180 tablet 3   sacubitril-valsartan (ENTRESTO) 24-26 MG Take 1 tablet by mouth 2 (two) times daily. 60 tablet 11   Saline 0.2 % SOLN Place 1 spray into both nostrils as needed (congestion).      tiZANidine (ZANAFLEX) 4 MG tablet Take 4 mg by mouth at bedtime.     ticagrelor (BRILINTA) 60 MG TABS tablet Take 1 tablet (60 mg total) by mouth 2 (two) times daily. 180 tablet 3   No current facility-administered medications for this encounter.    Allergies:   Hydralazine, Bidil [isosorb dinitrate-hydralazine], Ciprofloxacin, Farxiga [dapagliflozin], Propoxycaine, Amoxicillin, Hydrocodone, Lisinopril, Meloxicam, Strawberry extract, Tape, and Toradol [ketorolac tromethamine]   Social History:  The patient  reports that she has never smoked. She has never used smokeless tobacco. She reports that she does not drink alcohol and does not use drugs.   Family History:  The patient's family history includes Diabetes in her mother; Heart disease in her mother; Hypertension in her mother; Stroke in her mother.   ROS:  Please see the history of present illness.   All other systems are personally reviewed and negative.   Exam:   BP (!) 148/72   Pulse 80   Wt 98.4 kg (217 lb)   SpO2 99%   BMI 32.99 kg/m  General: NAD Neck: No JVD, no thyromegaly or thyroid nodule.  Lungs: Clear to auscultation bilaterally with normal respiratory  effort. CV: Nondisplaced PMI.  Heart regular S1/S2, no S3/S4, no murmur.  No peripheral edema.  No carotid bruit.  Normal pedal pulses.  Abdomen: Soft, nontender, no hepatosplenomegaly, no distention.  Skin: Intact without lesions or rashes.  Neurologic: Alert and oriented x 3.  Psych: Normal affect. Extremities: No clubbing or cyanosis.  HEENT: Normal.   Recent Labs: 02/29/2020: TSH 2.260 08/14/2020: Magnesium 1.7 08/16/2020: Hemoglobin 8.3; Platelets 102 08/19/2020: ALT 75; B Natriuretic Peptide 1,357.0 10/17/2020: BUN 19; Creatinine, Ser 1.75; Potassium 3.9; Sodium 143  Personally reviewed   Wt Readings from Last 3 Encounters:  10/17/20 98.4  kg (217 lb)  08/26/20 102.4 kg (225 lb 12.8 oz)  08/19/20 108.8 kg (239 lb 12.8 oz)    ASSESSMENT AND PLAN:  1. CAD: Anterior MI in 12/18 with DES to proximal LAD (culprit) followed by staged DES to proximal RCA.  7/19 admission with unstable angina, likely culprit was 95% stenosis in small PLV. This was managed conservatively.  No recent chest pain.       - Continue atorvastatin, check lipids today.  - Continue ASA 81 daily and ticagrelor 60 mg bid.  2. Chronic systolic CHF: Ischemic cardiomyopathy. Echo in 11/21 with EF 40-45%.  She is not volume overloaded on exam, NYHA class II symptoms. BP high.    - Continue Coreg 12.5 mg bid.     - Restart Entresto 24/26 bid.  BMET today and again in 10 days.     - She has Lasix for prn use.   - She was unable to tolerate Bidil at lowest does due to lightheadedness.   - She was unable to tolerate dapagliflozin.   - She is now out of ICD range. When EF was lower, she refused ICD.    - Repeat echo at 2 month followup. 3. CKD: stage 3.  Recent AKI.  - BMET today.  4. Fe deficiency anemia: Long-standing.  She has had IV Fe infusions.  5. Type II DM: Unable to tolerate dapagliflozin.   Followup in 3 wks with HF pharmacist, if creatinine remains stable will restart spironolactone at 12.5 mg daily. Followup  with me with echo in 2 months.   Signed, Loralie Champagne, MD  10/17/2020  [facility] [address] [facility] [place] [address] [phone] (office) [phone] (fax)

## 2020-10-30 NOTE — Progress Notes (Signed)
PCP:  [name], MD          HF Cardiology: Dr. [name]  HPI:  Stephanie Manning is a 60 y.o. female who has a history of CAD and ischemic cardiomyopathy.  In 02/2017, she had anterior MI with DES to totally occluded mid LAD (culprit). She returned in 03/2017 for staged DES to 80% RCA stenosis. Echo in 02/2017 showed EF 30-35%.  Repeat echo in 05/2017 showed EF stable at 30-35%.     She was admitted in 09/2017 with chest pain, concern for unstable angina.  Culprit of symptoms was thought to be 95% stenosis in a small PLV vessel, medical treatment planned.    She saw Dr. Rayann Heman and decided against ICD.    Echo was done in 08/2018 showing stable EF 30-35%.    She was unable to tolerate Bidil 1/2 tablet TID due to lightheadedness. She was unable to tolerate dapagliflozin.    Echo in 01/2020 showed EF 40-45% with apical septal and apical akinesis, normal RV.    Patient was admitted in 08/2020 with poor po intake, feeling "sick."  She was noted to be dehydrated with hypotension, orthostasis, and AKI.  She was hydrated and Entresto and spironolactone were held.  Creatinine was as high as 3.28.   She got IV fluid and developed volume overload requiring Lasix.    She recently presented to HF Clinic for followup of CHF and CAD on 10/17/20 with Dr. [name].  She was down another 8 lbs.  She was only taking Lasix prn.  BP had been running high, generally in the 150s.  No dyspnea walking on flat ground or up stairs.  No chest pain.  No lightheadedness.  She had been moving into a new house and had been active.  Overall was feeling good.     Today she returns to HF clinic for pharmacist medication titration. At last visit with MD, Delene Loll 24/26 mg BID was restarted. Overall she is feeling well today. Is currently moving into her new house. No dizziness, lightheadedness, chest pain or palpitations. No SOB/DOE. Weight at home has been stable, 213-216 lbs. Has not needed and PRN furosemide. No LEE, PND or  orthopnea. BP in clinic 130/74, BP at home 130-140/70s.   HF Medications: Carvedilol 12.5 mg BID Entresto 24/26 mg BID   Has the patient been experiencing any side effects to the medications prescribed?  no  Does the patient have any problems obtaining medications due to transportation or finances?   No - has  Medicare [place]out of state medicaid  Understanding of regimen: good Understanding of indications: good Potential of compliance: good Patient understands to avoid NSAIDs. Patient understands to avoid decongestants.    Pertinent Lab Values: Labs 11/07/20 from [facility]: Serum creatinine 1.69, Potassium 4.1, Sodium 141  Vital Signs: Weight: 216.2 lbs (last clinic weight: 217 lbs) Blood pressure: 130/74  Heart rate: 63   Assessment/Plan: 1. CAD: Anterior MI in 02/2017 with DES to proximal LAD (culprit) followed by staged DES to proximal RCA.  09/2017 admission with unstable angina, likely culprit was 95% stenosis in small PLV. This was managed conservatively.  No recent chest pain.       - Continue atorvastatin - Continue ASA 81 daily and ticagrelor 60 mg BID.  2. Chronic systolic CHF: Ischemic cardiomyopathy. Echo in 01/2020 with EF 40-45%.   - She is not volume overloaded on exam, NYHA class II symptoms.   - Continue Lasix for prn use. - Continue carvedilol 12.5  mg BID.     - Continue Entresto 24/26 mg BID.   - Start spironolactone 12.5 mg daily. Repeat BMET in 1 week at [facility].  - She was unable to tolerate Bidil at lowest does due to lightheadedness.   - She was unable to tolerate dapagliflozin due to lightheadedness.   - She is now out of ICD range. When EF was lower, she refused ICD.    - Repeat echo at 2 month followup. 3. CKD: stage 3.  Recent AKI.  - Scr improved to 1.69 on BMET 11/07/20 from [facility], personally reviewed. 4. Fe deficiency anemia: Long-standing.  She has had IV Fe infusions.  5. Type II DM: Unable to tolerate  dapagliflozin.  Follow up 2 months with Dr. Lita Mains, PharmD, BCPS, [facility], [facility] Pharmacist [phone]

## 2020-11-10 ENCOUNTER — Ambulatory Visit: Payer: Medicare Other | Admitting: Nurse Practitioner

## 2020-11-12 ENCOUNTER — Ambulatory Visit (HOSPITAL_COMMUNITY)
Admission: RE | Admit: 2020-11-12 | Discharge: 2020-11-12 | Disposition: A | Discharge status: Home / Self Care | Payer: Medicare Other | Source: Ambulatory Visit | Attending: Cardiology | Admitting: Cardiology

## 2020-11-12 ENCOUNTER — Other Ambulatory Visit: Payer: Self-pay

## 2020-11-12 VITALS — BP 130/74 | HR 63 | Wt 216.2 lb

## 2020-11-12 DIAGNOSIS — I252 Old myocardial infarction: Secondary | ICD-10-CM | POA: Diagnosis not present

## 2020-11-12 DIAGNOSIS — Z79899 Other long term (current) drug therapy: Secondary | ICD-10-CM | POA: Diagnosis not present

## 2020-11-12 DIAGNOSIS — I5042 Chronic combined systolic (congestive) and diastolic (congestive) heart failure: Secondary | ICD-10-CM | POA: Diagnosis present

## 2020-11-12 DIAGNOSIS — I255 Ischemic cardiomyopathy: Secondary | ICD-10-CM | POA: Insufficient documentation

## 2020-11-12 DIAGNOSIS — Z7901 Long term (current) use of anticoagulants: Secondary | ICD-10-CM | POA: Diagnosis not present

## 2020-11-12 DIAGNOSIS — D509 Iron deficiency anemia, unspecified: Secondary | ICD-10-CM | POA: Insufficient documentation

## 2020-11-12 DIAGNOSIS — E1122 Type 2 diabetes mellitus with diabetic chronic kidney disease: Secondary | ICD-10-CM | POA: Insufficient documentation

## 2020-11-12 DIAGNOSIS — N183 Chronic kidney disease, stage 3 unspecified: Secondary | ICD-10-CM | POA: Insufficient documentation

## 2020-11-12 DIAGNOSIS — I2510 Atherosclerotic heart disease of native coronary artery without angina pectoris: Secondary | ICD-10-CM | POA: Diagnosis not present

## 2020-11-12 MED ORDER — CARVEDILOL 12.5 MG PO TABS: 12.5000 mg | ORAL_TABLET | Freq: Two times a day (BID) | ORAL | 3 refills | Status: DC

## 2020-11-12 MED ORDER — ENTRESTO 24-26 MG PO TABS: 1.0000 | ORAL_TABLET | Freq: Two times a day (BID) | ORAL | 3 refills | Status: DC

## 2020-11-12 MED ORDER — FUROSEMIDE 20 MG PO TABS: 20.0000 mg | ORAL_TABLET | Freq: Every day | ORAL | 3 refills | Status: DC | PRN

## 2020-11-12 MED ORDER — SPIRONOLACTONE 25 MG PO TABS: 12.5000 mg | ORAL_TABLET | Freq: Every day | ORAL | 3 refills | Status: DC

## 2020-11-12 NOTE — Patient Instructions (Addendum)
It was a pleasure seeing you today!  MEDICATIONS: -We are changing your medications today -Start spironolactone 12.5 mg (1/2 tablet) daily -Call if you have questions about your medications.   NEXT APPOINTMENT: Return to clinic in 2 months with Dr. [name].  In general, to take care of your heart failure: -Limit your fluid intake to 2 Liters (half-gallon) per day.   -Limit your salt intake to ideally 2-3 grams (2000-3000 mg) per day. -Weigh yourself daily and record, and bring that "weight diary" to your next appointment.  (Weight gain of 2-3 pounds in 1 day typically means fluid weight.) -The medications for your heart are to help your heart and help you live longer.   -Please contact us before stopping any of your heart medications.  Call the clinic at [phone] with questions or to reschedule future appointments.

## 2020-11-24 ENCOUNTER — Other Ambulatory Visit (HOSPITAL_COMMUNITY): Payer: Self-pay | Admitting: Cardiology

## 2020-11-26 ENCOUNTER — Ambulatory Visit: Payer: Medicare Other | Admitting: Nurse Practitioner

## 2020-12-02 NOTE — Patient Instructions (Signed)
Diabetes Mellitus and Nutrition, Adult When you have diabetes, or diabetes mellitus, it is very important to have healthy eating habits because your blood sugar (glucose) levels are greatly affected by what you eat and drink. Eating healthy foods in the right amounts, at about the same times every day, can help you:  Control your blood glucose.  Lower your risk of heart disease.  Improve your blood pressure.  Reach or maintain a healthy weight. What can affect my meal plan? Every person with diabetes is different, and each person has different needs for a meal plan. Your health care provider may recommend that you work with a dietitian to make a meal plan that is best for you. Your meal plan may vary depending on factors such as:  The calories you need.  The medicines you take.  Your weight.  Your blood glucose, blood pressure, and cholesterol levels.  Your activity level.  Other health conditions you have, such as heart or kidney disease. How do carbohydrates affect me? Carbohydrates, also called carbs, affect your blood glucose level more than any other type of food. Eating carbs naturally raises the amount of glucose in your blood. Carb counting is a method for keeping track of how many carbs you eat. Counting carbs is important to keep your blood glucose at a healthy level, especially if you use insulin or take certain oral diabetes medicines. It is important to know how many carbs you can safely have in each meal. This is different for every person. Your dietitian can help you calculate how many carbs you should have at each meal and for each snack. How does alcohol affect me? Alcohol can cause a sudden decrease in blood glucose (hypoglycemia), especially if you use insulin or take certain oral diabetes medicines. Hypoglycemia can be a life-threatening condition. Symptoms of hypoglycemia, such as sleepiness, dizziness, and confusion, are similar to symptoms of having too much  alcohol.  Do not drink alcohol if: ? Your health care provider tells you not to drink. ? You are pregnant, may be pregnant, or are planning to become pregnant.  If you drink alcohol: ? Do not drink on an empty stomach. ? Limit how much you use to:  0-1 drink a day for women.  0-2 drinks a day for men. ? Be aware of how much alcohol is in your drink. In the [place] one drink equals one 12 oz bottle of beer (355 mL), one 5 oz glass of wine (148 mL), or one 1 oz glass of hard liquor (44 mL). ? Keep yourself hydrated with water, diet soda, or unsweetened iced tea.  Keep in mind that regular soda, juice, and other mixers may contain a lot of sugar and must be counted as carbs. What are tips for following this plan? Reading food labels  Start by checking the serving size on the "Nutrition Facts" label of packaged foods and drinks. The amount of calories, carbs, fats, and other nutrients listed on the label is based on one serving of the item. Many items contain more than one serving per package.  Check the total grams (g) of carbs in one serving. You can calculate the number of servings of carbs in one serving by dividing the total carbs by 15. For example, if a food has 30 g of total carbs per serving, it would be equal to 2 servings of carbs.  Check the number of grams (g) of saturated fats and trans fats in one serving. Choose foods that have   a low amount or none of these fats.  Check the number of milligrams (mg) of salt (sodium) in one serving. Most people should limit total sodium intake to less than 2,300 mg per day.  Always check the nutrition information of foods labeled as "low-fat" or "nonfat." These foods may be higher in added sugar or refined carbs and should be avoided.  Talk to your dietitian to identify your daily goals for nutrients listed on the label. Shopping  Avoid buying canned, pre-made, or processed foods. These foods tend to be high in fat, sodium, and added  sugar.  Shop around the outside edge of the grocery store. This is where you will most often find fresh fruits and vegetables, bulk grains, fresh meats, and fresh dairy. Cooking  Use low-heat cooking methods, such as baking, instead of high-heat cooking methods like deep frying.  Cook using healthy oils, such as olive, canola, or sunflower oil.  Avoid cooking with butter, cream, or high-fat meats. Meal planning  Eat meals and snacks regularly, preferably at the same times every day. Avoid going long periods of time without eating.  Eat foods that are high in fiber, such as fresh fruits, vegetables, beans, and whole grains. Talk with your dietitian about how many servings of carbs you can eat at each meal.  Eat 4-6 oz (112-168 g) of lean protein each day, such as lean meat, chicken, fish, eggs, or tofu. One ounce (oz) of lean protein is equal to: ? 1 oz (28 g) of meat, chicken, or fish. ? 1 egg. ?  cup (62 g) of tofu.  Eat some foods each day that contain healthy fats, such as avocado, nuts, seeds, and fish.   What foods should I eat? Fruits Berries. Apples. Oranges. Peaches. Apricots. Plums. Grapes. Mango. Papaya. Pomegranate. Kiwi. Cherries. Vegetables Lettuce. Spinach. Leafy greens, including kale, chard, collard greens, and mustard greens. Beets. Cauliflower. Cabbage. Broccoli. Carrots. Green beans. Tomatoes. Peppers. Onions. Cucumbers. Brussels sprouts. Grains Whole grains, such as whole-wheat or whole-grain bread, crackers, tortillas, cereal, and pasta. Unsweetened oatmeal. Quinoa. Brown or wild rice. Meats and other proteins Seafood. Poultry without skin. Lean cuts of poultry and beef. Tofu. Nuts. Seeds. Dairy Low-fat or fat-free dairy products such as milk, yogurt, and cheese. The items listed above may not be a complete list of foods and beverages you can eat. Contact a dietitian for more information. What foods should I avoid? Fruits Fruits canned with  syrup. Vegetables Canned vegetables. Frozen vegetables with butter or cream sauce. Grains Refined white flour and flour products such as bread, pasta, snack foods, and cereals. Avoid all processed foods. Meats and other proteins Fatty cuts of meat. Poultry with skin. Breaded or fried meats. Processed meat. Avoid saturated fats. Dairy Full-fat yogurt, cheese, or milk. Beverages Sweetened drinks, such as soda or iced tea. The items listed above may not be a complete list of foods and beverages you should avoid. Contact a dietitian for more information. Questions to ask a health care provider  Do I need to meet with a diabetes educator?  Do I need to meet with a dietitian?  What number can I call if I have questions?  When are the best times to check my blood glucose? Where to find more information:  American Diabetes Association: diabetes.org  Academy of Nutrition and Dietetics: www.eatright.org  National Institute of Diabetes and Digestive and Kidney Diseases: www.niddk.nih.gov  Association of Diabetes Care and Education Specialists: www.diabeteseducator.org Summary  It is important to have healthy eating   habits because your blood sugar (glucose) levels are greatly affected by what you eat and drink.  A healthy meal plan will help you control your blood glucose and maintain a healthy lifestyle.  Your health care provider may recommend that you work with a dietitian to make a meal plan that is best for you.  Keep in mind that carbohydrates (carbs) and alcohol have immediate effects on your blood glucose levels. It is important to count carbs and to use alcohol carefully. This information is not intended to replace advice given to you by your health care provider. Make sure you discuss any questions you have with your health care provider. Document Revised: 02/06/2019 Document Reviewed: 02/06/2019 Elsevier Patient Education  2021 [employer] Inc.  

## 2020-12-03 ENCOUNTER — Ambulatory Visit (INDEPENDENT_AMBULATORY_CARE_PROVIDER_SITE_OTHER): Payer: Medicare Other | Admitting: Nurse Practitioner

## 2020-12-03 ENCOUNTER — Encounter: Payer: Self-pay | Admitting: Nurse Practitioner

## 2020-12-03 ENCOUNTER — Other Ambulatory Visit: Payer: Self-pay

## 2020-12-03 VITALS — BP 122/74 | HR 77 | Ht 68.0 in | Wt 213.6 lb

## 2020-12-03 DIAGNOSIS — E1122 Type 2 diabetes mellitus with diabetic chronic kidney disease: Secondary | ICD-10-CM

## 2020-12-03 DIAGNOSIS — Z794 Long term (current) use of insulin: Secondary | ICD-10-CM

## 2020-12-03 DIAGNOSIS — N1832 Chronic kidney disease, stage 3b: Secondary | ICD-10-CM | POA: Diagnosis not present

## 2020-12-03 DIAGNOSIS — I10 Essential (primary) hypertension: Secondary | ICD-10-CM | POA: Diagnosis not present

## 2020-12-03 DIAGNOSIS — E782 Mixed hyperlipidemia: Secondary | ICD-10-CM

## 2020-12-03 LAB — POCT GLYCOSYLATED HEMOGLOBIN (HGB A1C): HbA1c, POC (controlled diabetic range): 6.8 % (ref 0.0–7.0)

## 2020-12-03 NOTE — Progress Notes (Signed)
12/03/2020, 11:45 AM  Endocrinology Follow Up Visit   Subjective:    Patient ID: Stephanie Manning, female    DOB: 1960-12-10.  Stephanie Manning is being seen in follow-up  for management of currently uncontrolled symptomatic diabetes requested by  [name], MD.   Past Medical History:  Diagnosis Date   AKI (acute kidney injury) ([place]) 02/2017   CAD in native artery    a. NSTEMI 02/2017 s/p DES to mLAD, diffuse disease otherwise, EF 30-35%. b. Staged cath 03/2017 - patent stent in the proximal to mid LAD. CTO of the distal LAD, 90% diffuse small first diagonal, 90% distal LCx/OM, 80% diffuse prox RCA, moderately elevated LVEDP 42mHg, s/p DES to mRCA, residual disease treated medically (too small for PCI), EF 30-35%.   Chronic combined systolic and diastolic CHF (congestive heart failure) ([facility]    CKD (chronic kidney disease), stage IV [place] [place]    /[name]2/09/2017   Diabetes mellitus, type II, insulin dependent (HCC)    GERD (gastroesophageal reflux disease)    History of blood transfusion    "related to menses"   History of stomach ulcers 2017   Hyperlipidemia    Hypertension    Iron deficiency anemia 03/2017   "had to have iron infusions" (10/10/2017)   Ischemic cardiomyopathy    STEMI (ST elevation myocardial infarction) ([facility] 02/2017    Past Surgical History:  Procedure Laterality Date   CESAREAN SECTION  1987   CORONARY ANGIOPLASTY WITH STENT PLACEMENT  04/01/2017   drug-eluting stent was successfully placed using a STENT SYNERGY DES [id number]   CORONARY STENT INTERVENTION N/A 04/01/2017   Procedure: CORONARY STENT INTERVENTION;  Surgeon: JMartinique [name], MD;  [facility];  Service: Cardiovascular;  Laterality: N/A;   CORONARY/GRAFT ACUTE MI REVASCULARIZATION N/A 03/10/2017   Procedure: Coronary/Graft Acute MI Revascularization;  Surgeon: JMartinique [name], MD;  [facility];  Service: Cardiovascular;  Laterality: N/A;   LEFT HEART CATH AND CORONARY ANGIOGRAPHY N/A 03/10/2017   Procedure: LEFT HEART CATH AND CORONARY ANGIOGRAPHY;  Surgeon: JMartinique [name], MD;  [facility];  Service: Cardiovascular;  Laterality: N/A;   LEFT HEART CATH AND CORONARY ANGIOGRAPHY N/A 04/01/2017   Procedure: LEFT HEART CATH AND CORONARY ANGIOGRAPHY;  Surgeon: JMartinique [name], MD;  [facility];  Service: Cardiovascular;  Laterality: N/A;   LEFT HEART CATH AND CORONARY ANGIOGRAPHY N/A 10/12/2017   Procedure: LEFT HEART CATH AND CORONARY ANGIOGRAPHY;  Surgeon: CSherren Mocha MD;  [facility];  Service: Cardiovascular;  Laterality: N/A;   OOPHORECTOMY     "1 ovary removed"   THROAT SURGERY  02/2014   "nodule in my throat removed; not related to thyroid"    Social History   Socioeconomic History   Marital status: Widowed    Spouse name: Not on file   Number of children: 2   Years of education: Not on file   Highest education level: Not on file  Occupational History   Not on file  Tobacco Use   Smoking status: Never   Smokeless  tobacco: Never  Vaping Use   Vaping Use: Never used  Substance and Sexual Activity   Alcohol use: Never   Drug use: Never   Sexual activity: Yes  Other Topics Concern   Not on file  Social History Narrative   Not on file   Social Determinants of Health   Financial Resource Strain: Not on file  Food Insecurity: Not on file  Transportation Needs: Not on file  Physical Activity: Not on file  Stress: Not on file  Social Connections: Not on file    Family History  Problem Relation Age of Onset   Hypertension Mother    Heart disease Mother    Diabetes Mother    Stroke Mother     Outpatient Encounter Medications as of 12/03/2020  Medication Sig   Accu-Chek Softclix Lancets lancets Use as instructed   aspirin 81 MG chewable tablet Chew 1 tablet (81 mg total) by mouth daily.    atorvastatin (LIPITOR) 80 MG tablet Take 80 mg by mouth daily.   blood glucose meter kit and supplies Dispense based on patient and insurance preference. Use up to four times daily as directed. (FOR ICD-10 E11.65) Pt prefers One touch mini   carvedilol (COREG) 12.5 MG tablet Take 1 tablet (12.5 mg total) by mouth 2 (two) times daily with a meal.   Dulaglutide (TRULICITY [place]) Inject into the skin every 14 (fourteen) days.   ELDERBERRY PO Take 2 tablets by mouth every other day.    ferrous sulfate 300 (60 Fe) MG/5ML syrup Take 300 mg by mouth daily with breakfast.   furosemide (LASIX) 20 MG tablet Take 1 tablet (20 mg total) by mouth daily as needed.   gabapentin (NEURONTIN) 300 MG capsule Take 300 mg by mouth at bedtime.    glipiZIDE (GLUCOTROL) 5 MG tablet Take by mouth daily before breakfast.   glucose blood (ACCU-CHEK GUIDE) test strip TEST BLOOD SUGAR FOUR TIMES A DAY BEFORE MEALS AND AT BEDTIME   insulin detemir (LEVEMIR) 100 UNIT/ML injection Inject 50 Units into the skin daily.   Multiple Vitamin (MULTIVITAMIN WITH MINERALS) TABS tablet Take 1 tablet by mouth daily.   nitroGLYCERIN (NITROSTAT) 0.4 MG SL tablet Place 1 tablet (0.4 mg total) under the tongue every 5 (five) minutes as needed for chest pain.   pantoprazole (PROTONIX) 40 MG tablet Take 1 tablet (40 mg total) by mouth 2 (two) times daily.   sacubitril-valsartan (ENTRESTO) 24-26 MG Take 1 tablet by mouth 2 (two) times daily.   Saline 0.2 % SOLN Place 1 spray into both nostrils as needed (congestion).    spironolactone (ALDACTONE) 25 MG tablet Take 0.5 tablets (12.5 mg total) by mouth daily.   ticagrelor (BRILINTA) 60 MG TABS tablet Take 1 tablet (60 mg total) by mouth 2 (two) times daily.   tiZANidine (ZANAFLEX) 4 MG tablet Take 4 mg by mouth at bedtime.   No facility-administered encounter medications on file as of 12/03/2020.    ALLERGIES: Allergies  Allergen Reactions   Hydralazine Other (See Comments)    Syncope due to  hypotension   Bidil [Isosorb Dinitrate-Hydralazine]    Ciprofloxacin    Farxiga [Dapagliflozin]    Propoxycaine    Amoxicillin Other (See Comments)    Yeast infection during treatment   Hydrocodone Palpitations, Rash and Other (See Comments)    GI upset, also   Lisinopril Palpitations and Other (See Comments)    Chest pain, too   Meloxicam Nausea Only   Strawberry Extract Hives  Does not always happen   Tape Rash    Some tapes cause rashes   Toradol [Ketorolac Tromethamine] Palpitations    VACCINATION STATUS:  There is no immunization history on file for this patient.  Diabetes She presents for her follow-up diabetic visit. She has type 2 diabetes mellitus. Onset time: She was diagnosed at approximate age of 69 years. Her disease course has been improving. There are no hypoglycemic associated symptoms. Pertinent negatives for hypoglycemia include no confusion, headaches, pallor or seizures. Pertinent negatives for diabetes include no chest pain, no fatigue, no polydipsia, no polyphagia and no polyuria. There are no hypoglycemic complications. Symptoms are stable. Diabetic complications include heart disease, nephropathy, peripheral neuropathy and retinopathy. Risk factors for coronary artery disease include diabetes mellitus, dyslipidemia, hypertension, obesity, sedentary lifestyle and post-menopausal. Current diabetic treatment includes insulin injections and oral agent (monotherapy) (and Trulicity every other week). She is compliant with treatment most of the time. Her weight is decreasing steadily. She is following a generally healthy diet. When asked about meal planning, she reported none. She has not had a previous visit with a dietitian. She never participates in exercise. Her home blood glucose trend is decreasing steadily. Her breakfast blood glucose range is generally 90-110 mg/dl. Her bedtime blood glucose range is generally 140-180 mg/dl. (She presents today with her meter, no  logs, showing at goal fasting glycemic profile.  Her POCT A1c today is 6.8%, improving from last visit of 7.7%.  She has been busy moving over the summer and expected it to be a bit higher due to the foods she has been eating.  She only takes the Trulicity 1.5 mg dose every other week due to constipation.  She denies any hypoglycemia.) An ACE inhibitor/angiotensin II receptor blocker is being taken. She does not see a podiatrist.Eye exam is current.  Hyperlipidemia This is a chronic problem. The current episode started more than 1 year ago. The problem is controlled. Recent lipid tests were reviewed and are normal. Exacerbating diseases include chronic renal disease, diabetes and obesity. Factors aggravating her hyperlipidemia include fatty foods and beta blockers. Pertinent negatives include no chest pain, myalgias or shortness of breath. Current antihyperlipidemic treatment includes statins. The current treatment provides mild improvement of lipids. Compliance problems include adherence to diet and adherence to exercise.  Risk factors for coronary artery disease include diabetes mellitus, dyslipidemia, hypertension, obesity, post-menopausal and family history.  Hypertension This is a chronic problem. The current episode started more than 1 year ago. The problem has been gradually improving since onset. The problem is controlled. Pertinent negatives include no chest pain, headaches, palpitations or shortness of breath. There are no associated agents to hypertension. Risk factors for coronary artery disease include diabetes mellitus, dyslipidemia, obesity, sedentary lifestyle and post-menopausal state. Past treatments include beta blockers, diuretics and angiotensin blockers. The current treatment provides moderate improvement. Compliance problems include diet and exercise.  Hypertensive end-organ damage includes kidney disease, CAD/MI and retinopathy. Identifiable causes of hypertension include chronic renal  disease.   Review of systems  Constitutional: + steadily decreasing body weight,  current Body mass index is 32.48 kg/m. , no fatigue, no subjective hyperthermia, no subjective hypothermia Eyes: no blurry vision, no xerophthalmia ENT: no sore throat, no nodules palpated in throat, no dysphagia/odynophagia, no hoarseness Cardiovascular: no chest pain, no shortness of breath, no palpitations, no leg swelling Respiratory: no cough, no shortness of breath Gastrointestinal: no nausea/vomiting/diarrhea Musculoskeletal: no muscle/joint aches Skin: no rashes, no hyperemia Neurological: no tremors, no numbness, no  tingling, no dizziness Psychiatric: no depression, no anxiety   Objective:    BP 122/74   Pulse 77   Ht '5\' 8"'  (1.727 m)   Wt 213 lb 9.6 oz (96.9 kg)   BMI 32.48 kg/m   Wt Readings from Last 3 Encounters:  12/03/20 213 lb 9.6 oz (96.9 kg)  11/12/20 216 lb 3.2 oz (98.1 kg)  10/17/20 217 lb (98.4 kg)    BP Readings from Last 3 Encounters:  12/03/20 122/74  11/12/20 130/74  10/17/20 (!) 148/72     Physical Exam- Limited  Constitutional:  Body mass index is 32.48 kg/m. , not in acute distress, normal state of mind Eyes:  EOMI, no exophthalmos Neck: Supple Cardiovascular: RRR, no murmurs, rubs, or gallops, no edema Respiratory: Adequate breathing efforts, no crackles, rales, rhonchi, or wheezing Musculoskeletal: no gross deformities, strength intact in all four extremities, no gross restriction of joint movements Skin:  no rashes, no hyperemia Neurological: no tremor with outstretched hands    CMP     Component Value Date/Time   NA 143 10/17/2020 1533   NA 140 02/29/2020 0000   K 3.9 10/17/2020 1533   CL 111 10/17/2020 1533   CO2 24 10/17/2020 1533   GLUCOSE 121 (H) 10/17/2020 1533   BUN 19 10/17/2020 1533   BUN 18 02/29/2020 0000   CREATININE 1.75 (H) 10/17/2020 1533   CREATININE 1.60 (H) 11/27/2018 1521   CALCIUM 9.3 10/17/2020 1533   PROT 6.7 08/19/2020  1608   PROT 7.2 02/29/2020 0000   ALBUMIN 2.8 (L) 08/19/2020 1608   ALBUMIN 4.2 02/29/2020 0000   AST 64 (H) 08/19/2020 1608   ALT 75 (H) 08/19/2020 1608   ALKPHOS 74 08/19/2020 1608   BILITOT 0.6 08/19/2020 1608   BILITOT 0.3 02/29/2020 0000   GFRNONAA 33 (L) 10/17/2020 1533   GFRNONAA 35 (L) 11/27/2018 1521   GFRAA 36 (L) 02/29/2020 0000   GFRAA 40 (L) 11/27/2018 1521     Diabetic Labs (most recent): Lab Results  Component Value Date   HGBA1C 6.8 12/03/2020   HGBA1C 7.7 (H) 08/14/2020   HGBA1C 6.7 (H) 02/29/2020     Lipid Panel ( most recent) Lipid Panel     Component Value Date/Time   CHOL 112 10/17/2020 1533   CHOL 84 (L) 02/29/2020 0000   TRIG 78 10/17/2020 1533   HDL 33 (L) 10/17/2020 1533   HDL 25 (L) 02/29/2020 0000   CHOLHDL 3.4 10/17/2020 1533   VLDL 16 10/17/2020 1533   LDLCALC 63 10/17/2020 1533   LDLCALC 41 02/29/2020 0000        Assessment & Plan:   1) DM type 2 causing vascular disease   - Stephanie Manning has currently uncontrolled symptomatic type 2 DM since 60 years of age.  She presents today with her meter, no logs, showing at goal fasting glycemic profile.  Her POCT A1c today is 6.8%, improving from last visit of 7.7%.  She has been busy moving over the summer and expected it to be a bit higher due to the foods she has been eating.  She only takes the Trulicity 1.5 mg dose every other week due to constipation.  She denies any hypoglycemia.  - I had a long discussion with her about the progressive nature of diabetes and the pathology behind its complications. -her diabetes is complicated by retinopathy, nephropathy, coronary artery disease and she remains at a high risk for more acute and chronic complications which include CAD, CVA, CKD,  retinopathy, and neuropathy. These are all discussed in detail with her.  - Nutritional counseling repeated at each appointment due to patients tendency to fall back in to old habits.  - The patient  admits there is a room for improvement in their diet and drink choices. -  Suggestion is made for the patient to avoid simple carbohydrates from their diet including Cakes, Sweet Desserts / Pastries, Ice Cream, Soda (diet and regular), Sweet Tea, Candies, Chips, Cookies, Sweet Pastries, Store Bought Juices, Alcohol in Excess of 1-2 drinks a day, Artificial Sweeteners, Coffee Creamer, and "Sugar-free" Products. This will help patient to have stable blood glucose profile and potentially avoid unintended weight gain.   - I encouraged the patient to switch to unprocessed or minimally processed complex starch and increased protein intake (animal or plant source), fruits, and vegetables.   - Patient is advised to stick to a routine mealtimes to eat 3 meals a day and avoid unnecessary snacks (to snack only to correct hypoglycemia).  - I have approached her with the following individualized plan to manage  her diabetes and patient agrees:   - she will benefit from simplification of her treatment regimen.  She will not need prandial insulin for now.    -Based on her stable, at goal glycemic profile, she is advised to continue Levemir 50 units SQ nightly, continue Trulicity 1.5 mg SQ every other week (r/t her constipation), and continue Glipizide 5 mg po daily with breakfast.   -She is encouraged to continue monitoring blood glucose at least twice a day, before breakfast and before bed and report blood glucose less than 70 or above 300 for three tests in a row.  She did not do well with SGLT2 inhibitor Wilder Glade when it was prescribed by her cardiologist as it caused significant hypotension.  - she is warned not to take insulin without proper monitoring per orders.  - she is not a candidate for metformin, SGLT2 inhibitors due to concurrent renal insufficiency.  - Patient specific target  A1c;  LDL, HDL, Triglycerides, and  Waist Circumference were discussed in detail.  2) Blood Pressure /Hypertension:   -Her blood pressure is controlled to target.  She is advised to continue Carvedilol 12.5 mg p.o. twice daily, Isosorbide mononitrate 30 mg p.o. daily, Entresto 97-103 mg p.o. twice daily, and Aldactone 25 mg p.o. daily.  3) Lipids/Hyperlipidemia:    Her most recent lipid panel from 10/17/20 shows controlled LDL at 63.  She is advised to continue Lipitor 80 mg po daily at bedtime.  Side effects and precautions discussed with her.  4)  Weight/Diet:  Her Body mass index is 32.48 kg/m.-   clearly complicating her diabetes care.  She is a candidate for continued, modest weight loss.  I discussed with her the fact that loss of 5 - 10% of her  current body weight will have the most impact on her diabetes management.  CDE Consult will be initiated . Exercise, and detailed carbohydrates information provided  -  detailed on discharge instructions.  5) Chronic Care/Health Maintenance: -she is on ACE/ARB and statin medications and is encouraged to initiate and continue to follow up with Ophthalmology, Dentist,  Podiatrist at least yearly or according to recommendations, and advised to stay away from smoking. I have recommended yearly flu vaccine and pneumonia vaccine at least every 5 years; moderate intensity exercise for up to 150 minutes weekly; and  sleep for at least 7 hours a day.  - she is advised to maintain  close follow up with Pall[place], Jarvis Newcomer, MD for primary care needs, as well as her other providers for optimal and coordinated care.      I spent 47 minutes in the care of the patient today including review of labs from [place], Lipids, Thyroid Function, Hematology (current and previous including abstractions from other facilities); face-to-face time discussing  her blood glucose readings/logs, discussing hypoglycemia and hyperglycemia episodes and symptoms, medications doses, her options of short and long term treatment based on the latest standards of care / guidelines;  discussion about incorporating  lifestyle medicine;  and documenting the encounter.    Please refer to Patient Instructions for Blood Glucose Monitoring and Insulin/Medications Dosing Guide"  in media tab for additional information. Please  also refer to " Patient Self Inventory" in the Media  tab for reviewed elements of pertinent patient history.  Stephanie Manning participated in the discussions, expressed understanding, and voiced agreement with the above plans.  All questions were answered to her satisfaction. she is encouraged to contact clinic should she have any questions or concerns prior to her return visit.   Follow up plan: - Return in about 4 months (around 04/04/2021) for Diabetes F/U with A1c in office, No previsit labs, Bring meter and logs.  Rayetta Pigg, [facility] [address] [place], [place] [address] Phone: [phone] Fax: [phone]   12/03/2020, 11:45 AM

## 2020-12-23 ENCOUNTER — Other Ambulatory Visit: Payer: Self-pay

## 2020-12-23 ENCOUNTER — Ambulatory Visit (HOSPITAL_BASED_OUTPATIENT_CLINIC_OR_DEPARTMENT_OTHER)
Admission: RE | Admit: 2020-12-23 | Discharge: 2020-12-23 | Disposition: A | Discharge status: Home / Self Care | Payer: Medicare Other | Source: Ambulatory Visit | Attending: Cardiology | Admitting: Cardiology

## 2020-12-23 ENCOUNTER — Encounter (HOSPITAL_COMMUNITY): Payer: Self-pay | Admitting: Cardiology

## 2020-12-23 ENCOUNTER — Ambulatory Visit (HOSPITAL_COMMUNITY)
Admission: RE | Admit: 2020-12-23 | Discharge: 2020-12-23 | Disposition: A | Discharge status: Home / Self Care | Payer: Medicare Other | Source: Ambulatory Visit | Attending: Family Medicine | Admitting: Family Medicine

## 2020-12-23 VITALS — BP 140/80 | HR 70 | Wt 213.2 lb

## 2020-12-23 DIAGNOSIS — Z7985 Long-term (current) use of injectable non-insulin antidiabetic drugs: Secondary | ICD-10-CM | POA: Diagnosis not present

## 2020-12-23 DIAGNOSIS — Z955 Presence of coronary angioplasty implant and graft: Secondary | ICD-10-CM | POA: Diagnosis not present

## 2020-12-23 DIAGNOSIS — I255 Ischemic cardiomyopathy: Secondary | ICD-10-CM | POA: Diagnosis not present

## 2020-12-23 DIAGNOSIS — Z7984 Long term (current) use of oral hypoglycemic drugs: Secondary | ICD-10-CM | POA: Insufficient documentation

## 2020-12-23 DIAGNOSIS — I130 Hypertensive heart and chronic kidney disease with heart failure and stage 1 through stage 4 chronic kidney disease, or unspecified chronic kidney disease: Secondary | ICD-10-CM | POA: Insufficient documentation

## 2020-12-23 DIAGNOSIS — Z7902 Long term (current) use of antithrombotics/antiplatelets: Secondary | ICD-10-CM | POA: Diagnosis not present

## 2020-12-23 DIAGNOSIS — Z8249 Family history of ischemic heart disease and other diseases of the circulatory system: Secondary | ICD-10-CM | POA: Diagnosis not present

## 2020-12-23 DIAGNOSIS — Z880 Allergy status to penicillin: Secondary | ICD-10-CM | POA: Diagnosis not present

## 2020-12-23 DIAGNOSIS — I2510 Atherosclerotic heart disease of native coronary artery without angina pectoris: Secondary | ICD-10-CM | POA: Insufficient documentation

## 2020-12-23 DIAGNOSIS — D509 Iron deficiency anemia, unspecified: Secondary | ICD-10-CM | POA: Insufficient documentation

## 2020-12-23 DIAGNOSIS — E785 Hyperlipidemia, unspecified: Secondary | ICD-10-CM | POA: Diagnosis not present

## 2020-12-23 DIAGNOSIS — Z7982 Long term (current) use of aspirin: Secondary | ICD-10-CM | POA: Insufficient documentation

## 2020-12-23 DIAGNOSIS — Z881 Allergy status to other antibiotic agents status: Secondary | ICD-10-CM | POA: Diagnosis not present

## 2020-12-23 DIAGNOSIS — E1122 Type 2 diabetes mellitus with diabetic chronic kidney disease: Secondary | ICD-10-CM | POA: Insufficient documentation

## 2020-12-23 DIAGNOSIS — Z885 Allergy status to narcotic agent status: Secondary | ICD-10-CM | POA: Diagnosis not present

## 2020-12-23 DIAGNOSIS — N183 Chronic kidney disease, stage 3 unspecified: Secondary | ICD-10-CM | POA: Diagnosis not present

## 2020-12-23 DIAGNOSIS — Z888 Allergy status to other drugs, medicaments and biological substances status: Secondary | ICD-10-CM | POA: Diagnosis not present

## 2020-12-23 DIAGNOSIS — Z794 Long term (current) use of insulin: Secondary | ICD-10-CM | POA: Diagnosis not present

## 2020-12-23 DIAGNOSIS — I5022 Chronic systolic (congestive) heart failure: Secondary | ICD-10-CM | POA: Diagnosis not present

## 2020-12-23 DIAGNOSIS — I5042 Chronic combined systolic (congestive) and diastolic (congestive) heart failure: Secondary | ICD-10-CM | POA: Diagnosis not present

## 2020-12-23 DIAGNOSIS — I252 Old myocardial infarction: Secondary | ICD-10-CM | POA: Insufficient documentation

## 2020-12-23 DIAGNOSIS — Z79899 Other long term (current) drug therapy: Secondary | ICD-10-CM | POA: Diagnosis not present

## 2020-12-23 LAB — BASIC METABOLIC PANEL
Anion gap: 10 (ref 5–15)
BUN: 20 mg/dL (ref 8–23)
CO2: 23 mmol/L (ref 22–32)
Calcium: 9.5 mg/dL (ref 8.9–10.3)
Chloride: 108 mmol/L (ref 98–111)
Creatinine, Ser: 1.64 mg/dL — ABNORMAL HIGH (ref 0.44–1.00)
GFR, Estimated: 35 mL/min — ABNORMAL LOW (ref >60)
Glucose, Bld: 64 mg/dL — ABNORMAL LOW (ref 70–99)
Potassium: 3.9 mmol/L (ref 3.5–5.1)
Sodium: 141 mmol/L (ref 135–145)

## 2020-12-23 LAB — ECHOCARDIOGRAM COMPLETE
AR max vel: 3.78 cm2
AV Area VTI: 3.99 cm2
AV Area mean vel: 3.9 cm2
AV Mean grad: 2 mmHg
AV Peak grad: 4.4 mmHg
Ao pk vel: 1.05 m/s
Area-P 1/2: 4.21 cm2
Calc EF: 39.6 %
MV VTI: 3.06 cm2
S' Lateral: 4.4 cm
Single Plane A2C EF: 44.7 %
Single Plane A4C EF: 39.6 %

## 2020-12-23 MED ORDER — SPIRONOLACTONE 25 MG PO TABS: 25.0000 mg | ORAL_TABLET | Freq: Every day | ORAL | 3 refills | Status: DC

## 2020-12-23 NOTE — Progress Notes (Signed)
  Echocardiogram 2D Echocardiogram has been performed.  Stephanie Manning 12/23/2020, 2:44 PM

## 2020-12-23 NOTE — Progress Notes (Signed)
Date:  12/23/2020   ID:  [name], DOB 09/06/1960, MRN [id number]  Provider location: [facility] Type of Visit: Established patient  PCP:  [name], MD  HF Cardiology: Dr. [name]   History of Present Illness: Stephanie Manning is a 60 y.o. female who has a history of CAD and ischemic cardiomyopathy.  In 12/18, she had anterior MI with DES to totally occluded mid LAD (culprit). She returned in 1/19 for staged DES to 80% RCA stenosis. Echo in 12/18 showed EF 30-35%.  Repeat echo in 3/19 showed EF stable at 30-35%.     She was admitted in 7/19 with chest pain, concern for unstable angina.  Culprit of symptoms was thought to be 95% stenosis in a small PLV vessel, medical treatment planned.    She saw Dr. Rayann Heman and decided against ICD.   Echo was done in 6/20 showing stable EF 30-35%.    She was unable to tolerate Bidil 1/2 tablet tid due to lightheadedness. She was unable to tolerate dapagliflozin.   Echo in 11/21 showed EF 40-45% with apical septal and apical akinesis, normal RV.   Patient was admitted in 6/22 with poor po intake, feeling "sick."  She was noted to be dehydrated with hypotension, orthostasis, and AKI.  She was hydrated and Entresto and spironolactone were held.  Creatinine was as high as 3.28.   She got IV fluid and developed volume overload requiring Lasix.   Echo was done today and reviewed, EF 45% with akinetic apex, normal RV.   She returns for followup of CHF and CAD.  She has lost 4 lbs.  She has been working hard to move to a new house.  No significant exertional dyspnea working around her new house or walking on flat ground.  No chest pain.  No orthopnea/PND.  She has not been using Lasix.  No lightheadedness.   ECG (personally reviewed): NSR, lateral and anterolateral TWIs.    Labs (2/19): K 4.7, creatinine 1.48, hgb 8.4 Labs (5/19): LDL 65, K 4.7, creatinine 1.66 Labs (8/19): K 5, creatinine 1.78  Labs  (9/19): K 3.8, creatinine 1.6 Labs (12/19): K 3.9, creatinine 1.59 Labs (3/20): LDL 73, HDL 41 Labs (5/20): K 4.1, creatinine 1.59, LDL 73 Labs (9/20): K 4.2, creatinine 1.6 Labs (11/20): K 4.1, creatinine 1.7, LDL 50, HDL 32 Labs (3/21): K 4.1, creatinine 1.67 Labs (8/21): LDL 76, HDL 30, K 4, creatinine 1.86 Labs (12/21): LDL 41, HDL 25, TSH normal, K 4, creatinine 1.74 Labs (6/22): creatinine 3.28 => 1.96 => 1.94, K 3.8, AST 76, ALT 65 Labs (8/22): LDL 63, K 4.1, creatinine 1.69   PMH: 1. Type II diabetes 2. CKD stage 3.  3. Hyperlipidemia 4. HTN 5. Fe deficiency anemia 6. GERD 7. CAD: Anterior MI in 12/18 with DES to 100% stenosed mid LAD (culprit), also with CTO of distal LAD, 80% pRCA, 85% mLCx/90% dLCx, 90% OM3.  - 1/19 staged PCI of proximal RCA.  - Admitted with unstable angina 7/19.  LHC: 75% stenosis proximal-mid LCx, 60% OM1, total occlusion of the distal LAD, patent mid LAD stent, patent RCA stents, 95% stenosis in small PLV branch (likely culprit).  8. Chronic systolic CHF: Ischemic cardiomyopathy.  - Echo (12/18): EF 30-35%.  - Echo (3/19): EF 30-35%, WMAs in LAD territory, RV mildly dilated.  - Echo (6/20): EF 30-35%, mild LV dilation, normal RV size and systolic function.  - Echo (11/21): EF 40-45% with  apical septal and apical akinesis, normal RV. - Echo (8/22): EF 45% with akinetic apex, normal RV.   Current Outpatient Medications  Medication Sig Dispense Refill   Accu-Chek Softclix Lancets lancets Use as instructed 100 each 2   aspirin 81 MG chewable tablet Chew 1 tablet (81 mg total) by mouth daily.     atorvastatin (LIPITOR) 80 MG tablet Take 80 mg by mouth daily.     blood glucose meter kit and supplies Dispense based on patient and insurance preference. Use up to four times daily as directed. (FOR ICD-10 E11.65) Pt prefers One touch mini 1 each 5   carvedilol (COREG) 12.5 MG tablet Take 1 tablet (12.5 mg total) by mouth 2 (two) times daily with a meal. 180  tablet 3   Dulaglutide (TRULICITY [place]) Inject into the skin every 14 (fourteen) days.     ELDERBERRY PO Take 2 tablets by mouth every other day.      ferrous sulfate 300 (60 Fe) MG/5ML syrup Take 300 mg by mouth daily with breakfast.     furosemide (LASIX) 20 MG tablet Take 1 tablet (20 mg total) by mouth daily as needed. 90 tablet 3   gabapentin (NEURONTIN) 300 MG capsule Take 300 mg by mouth at bedtime.      glipiZIDE (GLUCOTROL) 5 MG tablet Take by mouth daily before breakfast.     glucose blood (ACCU-CHEK GUIDE) test strip TEST BLOOD SUGAR FOUR TIMES A DAY BEFORE MEALS AND AT BEDTIME 150 strip 3   insulin detemir (LEVEMIR) 100 UNIT/ML injection Inject 40 Units into the skin daily.     Multiple Vitamin (MULTIVITAMIN WITH MINERALS) TABS tablet Take 1 tablet by mouth daily.     nitroGLYCERIN (NITROSTAT) 0.4 MG SL tablet Place 1 tablet (0.4 mg total) under the tongue every 5 (five) minutes as needed for chest pain. 25 tablet 3   NONFORMULARY OR COMPOUNDED ITEM Seamoss once daily     pantoprazole (PROTONIX) 40 MG tablet Take 1 tablet (40 mg total) by mouth 2 (two) times daily. 180 tablet 3   sacubitril-valsartan (ENTRESTO) 24-26 MG Take 1 tablet by mouth 2 (two) times daily. 180 tablet 3   Saline 0.2 % SOLN Place 1 spray into both nostrils as needed (congestion).      ticagrelor (BRILINTA) 60 MG TABS tablet Take 1 tablet (60 mg total) by mouth 2 (two) times daily. 180 tablet 3   tiZANidine (ZANAFLEX) 4 MG tablet Take 4 mg by mouth at bedtime.     spironolactone (ALDACTONE) 25 MG tablet Take 1 tablet (25 mg total) by mouth daily. 90 tablet 3   No current facility-administered medications for this encounter.    Allergies:   Hydralazine, Bidil [isosorb dinitrate-hydralazine], Ciprofloxacin, Farxiga [dapagliflozin], Propoxycaine, Amoxicillin, Hydrocodone, Lisinopril, Meloxicam, Strawberry extract, Tape, and Toradol [ketorolac tromethamine]   Social History:  The patient  reports that she has never  smoked. She has never used smokeless tobacco. She reports that she does not drink alcohol and does not use drugs.   Family History:  The patient's family history includes Diabetes in her mother; Heart disease in her mother; Hypertension in her mother; Stroke in her mother.   ROS:  Please see the history of present illness.   All other systems are personally reviewed and negative.   Exam:   BP 140/80   Pulse 70   Wt 96.7 kg (213 lb 3.2 oz)   SpO2 100%   BMI 32.42 kg/m  General: NAD Neck: No JVD, no thyromegaly  or thyroid nodule.  Lungs: Clear to auscultation bilaterally with normal respiratory effort. CV: Nondisplaced PMI.  Heart regular S1/S2, no S3/S4, no murmur.  No peripheral edema.  No carotid bruit.  Normal pedal pulses.  Abdomen: Soft, nontender, no hepatosplenomegaly, no distention.  Skin: Intact without lesions or rashes.  Neurologic: Alert and oriented x 3.  Psych: Normal affect. Extremities: No clubbing or cyanosis.  HEENT: Normal.   Recent Labs: 02/29/2020: TSH 2.260 08/14/2020: Magnesium 1.7 08/16/2020: Hemoglobin 8.3; Platelets 102 08/19/2020: ALT 75; B Natriuretic Peptide 1,357.0 12/23/2020: BUN 20; Creatinine, Ser 1.64; Potassium 3.9; Sodium 141  Personally reviewed   Wt Readings from Last 3 Encounters:  12/23/20 96.7 kg (213 lb 3.2 oz)  12/03/20 96.9 kg (213 lb 9.6 oz)  11/12/20 98.1 kg (216 lb 3.2 oz)    ASSESSMENT AND PLAN:  1. CAD: Anterior MI in 12/18 with DES to proximal LAD (culprit) followed by staged DES to proximal RCA.  7/19 admission with unstable angina, likely culprit was 95% stenosis in small PLV. This was managed conservatively.  No recent chest pain.       - Continue atorvastatin, good lipids in 8/22.  - Continue ASA 81 daily and ticagrelor 60 mg bid.  2. Chronic systolic CHF: Ischemic cardiomyopathy. Echo today showed EF 45%.  She is not volume overloaded on exam, NYHA class II symptoms. BP mildly high.    - Continue Coreg 12.5 mg bid.     -  Continue Entresto 24/26 bid.   - Increase spironolactone to 25 mg daily.  BMET today and in 10 days.  - She has Lasix for prn use.   - She was unable to tolerate Bidil at lowest does due to lightheadedness.   - She was unable to tolerate dapagliflozin.   - She is now out of ICD range. When EF was lower, she refused ICD.    - Repeat echo at 2 month followup. 3. CKD: stage 3.   - BMET today.  4. Fe deficiency anemia: Long-standing.  She has had IV Fe infusions.  5. Type II DM: Unable to tolerate dapagliflozin.   Followup in 3 months with APP.   Signed, Loralie Champagne, MD  12/23/2020  [facility] [address] Heart and [place] [address] [phone] (office) [phone] (fax)

## 2020-12-23 NOTE — Patient Instructions (Addendum)
EKG done today.  Labs done today. We will contact you only if your labs are abnormal.  INCREASE Spironolactone to 25mg  (1 tablet) by mouth daily.   No other medication changes were made. Please continue all current medications as prescribed.  Your physician recommends that you schedule a follow-up appointment in: 10 days for a lab only appointment can be done in [place] paper prescription was provided to you during your appointment.)3 months with our APP [facility] here in our office.   If you have any questions or concerns before your next appointment please send [name] a message through [place] or call our office at [phone].    TO LEAVE A MESSAGE FOR THE NURSE SELECT OPTION 2, PLEASE LEAVE A MESSAGE INCLUDING: YOUR NAME DATE OF BIRTH CALL BACK NUMBER REASON FOR CALL**this is important as we prioritize the call backs  YOU WILL RECEIVE A CALL BACK THE SAME DAY AS LONG AS YOU CALL BEFORE 4:00 PM   Do the following things EVERYDAY: Weigh yourself in the morning before breakfast. Write it down and keep it in a log. Take your medicines as prescribed Eat low salt foods--Limit salt (sodium) to 2000 mg per day.  Stay as active as you can everyday Limit all fluids for the day to less than 2 liters   At the [facility], you and your health needs are our priority. As part of our continuing mission to provide you with exceptional heart care, we have created designated Provider Care Teams. These Care Teams include your primary Cardiologist (physician) and Advanced Practice Providers (APPs- Physician Assistants and Nurse Practitioners) who all work together to provide you with the care you need, when you need it.   You may see any of the following providers on your designated Care Team at your next follow up: Dr Glori Bickers Dr Haynes Kerns, NP [facility], [place] Audry Riles, PharmD   Please be sure to bring in all your medications bottles to every  appointment.

## 2021-01-15 ENCOUNTER — Other Ambulatory Visit (HOSPITAL_COMMUNITY): Payer: Self-pay | Admitting: Cardiology

## 2021-02-16 ENCOUNTER — Other Ambulatory Visit: Payer: Self-pay | Admitting: Cardiology

## 2021-02-17 ENCOUNTER — Other Ambulatory Visit: Payer: Self-pay | Admitting: "Endocrinology

## 2021-02-17 ENCOUNTER — Telehealth: Payer: Self-pay | Admitting: "Endocrinology

## 2021-02-17 DIAGNOSIS — E1159 Type 2 diabetes mellitus with other circulatory complications: Secondary | ICD-10-CM

## 2021-02-17 NOTE — Telephone Encounter (Signed)
Prescription has been sent to pharmacy requested.

## 2021-02-17 NOTE — Telephone Encounter (Signed)
Patient is requesting a refill on her accu chek test strips. Please send to [facility]

## 2021-03-11 ENCOUNTER — Telehealth: Payer: Self-pay | Admitting: Cardiology

## 2021-03-11 NOTE — Telephone Encounter (Signed)
Pt c/o BP issue: STAT if pt c/o blurred vision, one-sided weakness or slurred speech  1. What are your last 5 BP readings? 170/65 P 95  175/73 P 95 THIS MORNING 153/83 P 71 Sunday 125/64 P 72 December 15    2. Are you having any other symptoms (ex. Dizziness, headache, blurred vision, passed out)?  GAS DIZZINESS BALANCE OFF  3. What is your BP issue? PT WANTS TO KNOW IF THESE NUMBER ARE OKAY

## 2021-03-11 NOTE — Telephone Encounter (Signed)
Spoke to pt . She report last night and into the morning she's been feeling like her heart is beating harder (BP/HR readings listed below). She denies CP or SOB but report she felt off balance last night when she tried to walk to the bathroom. Pt report currently she feels fine as she is sitting and resting.    143/99 P 73 current 175/73 P 95 THIS MORNING 153/83 P 71 Sunday 125/64 P 72 December 15  Pt has an appointment scheduled on 1/16 with Dr. [name]. Nurse attempted to find a sooner appointment but was unsuccessful.   Will forward to MD for further recommendations.

## 2021-03-12 NOTE — Telephone Encounter (Signed)
Pt updated and verbalized understanding.  

## 2021-03-12 NOTE — Telephone Encounter (Signed)
Nothing to add at this time. Keep appt. With Dr [name]. Monitor BP and HR  Stephanie Manning [name] MD, [facility]

## 2021-03-30 ENCOUNTER — Encounter (HOSPITAL_COMMUNITY): Payer: Self-pay

## 2021-03-30 ENCOUNTER — Other Ambulatory Visit: Payer: Self-pay

## 2021-03-30 ENCOUNTER — Ambulatory Visit (HOSPITAL_COMMUNITY)
Admission: RE | Admit: 2021-03-30 | Discharge: 2021-03-30 | Disposition: A | Discharge status: Home / Self Care | Payer: Medicare Other | Source: Ambulatory Visit | Attending: Family Medicine | Admitting: Family Medicine

## 2021-03-30 VITALS — BP 144/78 | HR 73 | Wt 218.8 lb

## 2021-03-30 DIAGNOSIS — I255 Ischemic cardiomyopathy: Secondary | ICD-10-CM | POA: Insufficient documentation

## 2021-03-30 DIAGNOSIS — Z7902 Long term (current) use of antithrombotics/antiplatelets: Secondary | ICD-10-CM | POA: Diagnosis not present

## 2021-03-30 DIAGNOSIS — I25110 Atherosclerotic heart disease of native coronary artery with unstable angina pectoris: Secondary | ICD-10-CM | POA: Insufficient documentation

## 2021-03-30 DIAGNOSIS — I5042 Chronic combined systolic (congestive) and diastolic (congestive) heart failure: Secondary | ICD-10-CM | POA: Diagnosis not present

## 2021-03-30 DIAGNOSIS — I252 Old myocardial infarction: Secondary | ICD-10-CM | POA: Insufficient documentation

## 2021-03-30 DIAGNOSIS — E1122 Type 2 diabetes mellitus with diabetic chronic kidney disease: Secondary | ICD-10-CM | POA: Insufficient documentation

## 2021-03-30 DIAGNOSIS — I5022 Chronic systolic (congestive) heart failure: Secondary | ICD-10-CM | POA: Diagnosis not present

## 2021-03-30 DIAGNOSIS — N184 Chronic kidney disease, stage 4 (severe): Secondary | ICD-10-CM

## 2021-03-30 DIAGNOSIS — Z79899 Other long term (current) drug therapy: Secondary | ICD-10-CM | POA: Diagnosis not present

## 2021-03-30 DIAGNOSIS — D509 Iron deficiency anemia, unspecified: Secondary | ICD-10-CM | POA: Diagnosis not present

## 2021-03-30 DIAGNOSIS — Z7901 Long term (current) use of anticoagulants: Secondary | ICD-10-CM | POA: Diagnosis not present

## 2021-03-30 DIAGNOSIS — Z7982 Long term (current) use of aspirin: Secondary | ICD-10-CM | POA: Diagnosis not present

## 2021-03-30 DIAGNOSIS — I130 Hypertensive heart and chronic kidney disease with heart failure and stage 1 through stage 4 chronic kidney disease, or unspecified chronic kidney disease: Secondary | ICD-10-CM | POA: Diagnosis not present

## 2021-03-30 DIAGNOSIS — N183 Chronic kidney disease, stage 3 unspecified: Secondary | ICD-10-CM | POA: Insufficient documentation

## 2021-03-30 DIAGNOSIS — R42 Dizziness and giddiness: Secondary | ICD-10-CM | POA: Diagnosis present

## 2021-03-30 DIAGNOSIS — E1159 Type 2 diabetes mellitus with other circulatory complications: Secondary | ICD-10-CM

## 2021-03-30 DIAGNOSIS — I25118 Atherosclerotic heart disease of native coronary artery with other forms of angina pectoris: Secondary | ICD-10-CM | POA: Diagnosis not present

## 2021-03-30 LAB — BASIC METABOLIC PANEL
Anion gap: 10 (ref 5–15)
BUN: 27 mg/dL — ABNORMAL HIGH (ref 8–23)
CO2: 22 mmol/L (ref 22–32)
Calcium: 9.3 mg/dL (ref 8.9–10.3)
Chloride: 109 mmol/L (ref 98–111)
Creatinine, Ser: 1.98 mg/dL — ABNORMAL HIGH (ref 0.44–1.00)
GFR, Estimated: 28 mL/min — ABNORMAL LOW (ref >60)
Glucose, Bld: 188 mg/dL — ABNORMAL HIGH (ref 70–99)
Potassium: 4.2 mmol/L (ref 3.5–5.1)
Sodium: 141 mmol/L (ref 135–145)

## 2021-03-30 MED ORDER — ENTRESTO 49-51 MG PO TABS: 1.0000 | ORAL_TABLET | Freq: Two times a day (BID) | ORAL | 4 refills | Status: DC

## 2021-03-30 NOTE — Progress Notes (Signed)
Date:  03/30/2021   ID:  Stephanie Manning, DOB [birth year], MRN [id number]  Provider location: [place] [facility] Type of Visit: Established patient  PCP:  [name], MD  HF Cardiology: Dr. [name]   History of Present Illness: Stephanie Manning is a 61 y.o. female who has a history of CAD and ischemic cardiomyopathy.  In 12/18, she had anterior MI with DES to totally occluded mid LAD (culprit). She returned in 1/19 for staged DES to 80% RCA stenosis. Echo in 12/18 showed EF 30-35%.  Repeat echo in 3/19 showed EF stable at 30-35%.     She was admitted in 7/19 with chest pain, concern for unstable angina.  Culprit of symptoms was thought to be 95% stenosis in a small PLV vessel, medical treatment planned.    She saw Dr. Rayann Heman and decided against ICD.   Echo was done in 6/20 showing stable EF 30-35%.    She was unable to tolerate Bidil 1/2 tablet tid due to lightheadedness. She was unable to tolerate dapagliflozin.   Echo in 11/21 showed EF 40-45% with apical septal and apical akinesis, normal RV.   Patient was admitted in 6/22 with poor po intake, feeling "sick."  She was noted to be dehydrated with hypotension, orthostasis, and AKI.  She was hydrated and Entresto and spironolactone were held.  Creatinine was as high as 3.28.   She got IV fluid and developed volume overload requiring Lasix.   Echo 10/22 EF 45% with akinetic apex, normal RV.   Today she returns for HF follow up. Overall feeling fine. Occasional dizziness after she drinks her daily CBD tea. She says this tea helps lower her BP. BP 1150-60-70s at home after drinking her tea. She recently joined a gym and had no exertional dyspnea with weight machines or the rower. Denies palpitations, abnormal bleeding, CP, edema, or PND/Orthopnea. Appetite ok. No fever or chills. Weight at home 213-215 pounds. Taking all medications. Taking sea moss gummies for weight loss. Has not needed any lasix.  ECG  (personally reviewed): none ordered today.   Labs (2/19): K 4.7, creatinine 1.48, hgb 8.4 Labs (5/19): LDL 65, K 4.7, creatinine 1.66 Labs (8/19): K 5, creatinine 1.78  Labs (9/19): K 3.8, creatinine 1.6 Labs (12/19): K 3.9, creatinine 1.59 Labs (3/20): LDL 73, HDL 41 Labs (5/20): K 4.1, creatinine 1.59, LDL 73 Labs (9/20): K 4.2, creatinine 1.6 Labs (11/20): K 4.1, creatinine 1.7, LDL 50, HDL 32 Labs (3/21): K 4.1, creatinine 1.67 Labs (8/21): LDL 76, HDL 30, K 4, creatinine 1.86 Labs (12/21): LDL 41, HDL 25, TSH normal, K 4, creatinine 1.74 Labs (6/22): creatinine 3.28 => 1.96 => 1.94, K 3.8, AST 76, ALT 65 Labs (8/22): LDL 63, K 4.1, creatinine 1.69 Labs (10/22): K 4.7, creatinine 1.82   PMH: 1. Type II diabetes 2. CKD stage 3.  3. Hyperlipidemia 4. HTN 5. Fe deficiency anemia 6. GERD 7. CAD: Anterior MI in 12/18 with DES to 100% stenosed mid LAD (culprit), also with CTO of distal LAD, 80% pRCA, 85% mLCx/90% dLCx, 90% OM3.  - 1/19 staged PCI of proximal RCA.  - Admitted with unstable angina 7/19.  LHC: 75% stenosis proximal-mid LCx, 60% OM1, total occlusion of the distal LAD, patent mid LAD stent, patent RCA stents, 95% stenosis in small PLV branch (likely culprit).  8. Chronic systolic CHF: Ischemic cardiomyopathy.  - Echo (12/18): EF 30-35%.  - Echo (3/19): EF 30-35%, WMAs in LAD territory,  RV mildly dilated.  - Echo (6/20): EF 30-35%, mild LV dilation, normal RV size and systolic function.  - Echo (11/21): EF 40-45% with apical septal and apical akinesis, normal RV. - Echo (8/22): EF 45% with akinetic apex, normal RV.   Current Outpatient Medications  Medication Sig Dispense Refill   ACCU-CHEK GUIDE test strip Check blood sugar 2 times a day before breakfast and every night before bedtime. 200 each 3   Accu-Chek Softclix Lancets lancets Use as instructed 100 each 2   aspirin 81 MG chewable tablet Chew 1 tablet (81 mg total) by mouth daily.     atorvastatin (LIPITOR) 80  MG tablet Take 80 mg by mouth daily.     blood glucose meter kit and supplies Dispense based on patient and insurance preference. Use up to four times daily as directed. (FOR ICD-10 E11.65) Pt prefers One touch mini 1 each 5   carvedilol (COREG) 12.5 MG tablet Take 1 tablet (12.5 mg total) by mouth 2 (two) times daily with a meal. 180 tablet 3   Dulaglutide (TRULICITY [place]) Inject into the skin every 14 (fourteen) days.     ELDERBERRY PO Take 2 tablets by mouth every other day.      ferrous sulfate 300 (60 Fe) MG/5ML syrup Take 300 mg by mouth daily with breakfast.     furosemide (LASIX) 20 MG tablet Take 1 tablet (20 mg total) by mouth daily as needed. 90 tablet 3   gabapentin (NEURONTIN) 300 MG capsule Take 300 mg by mouth at bedtime.      glipiZIDE (GLUCOTROL) 5 MG tablet Take by mouth daily before breakfast. As needed     insulin detemir (LEVEMIR) 100 UNIT/ML injection Inject 40 Units into the skin daily.     Multiple Vitamin (MULTIVITAMIN WITH MINERALS) TABS tablet Take 1 tablet by mouth daily.     nitroGLYCERIN (NITROSTAT) 0.4 MG SL tablet Dissolve 1 tab under tongue for chest pain- if pain remains after 5 min, call 911, maxof 3 tabs in 15 minutes 25 tablet 2   NONFORMULARY OR COMPOUNDED ITEM Seamoss once daily     pantoprazole (PROTONIX) 40 MG tablet Take 1 tablet (40 mg total) by mouth 2 (two) times daily. 180 tablet 3   sacubitril-valsartan (ENTRESTO) 24-26 MG Take 1 tablet by mouth 2 (two) times daily. 180 tablet 3   Saline 0.2 % SOLN Place 1 spray into both nostrils as needed (congestion).      spironolactone (ALDACTONE) 25 MG tablet Take 1 tablet (25 mg total) by mouth daily. 90 tablet 3   ticagrelor (BRILINTA) 60 MG TABS tablet Take 1 tablet (60 mg total) by mouth 2 (two) times daily. 180 tablet 3   tiZANidine (ZANAFLEX) 4 MG tablet Take 4 mg by mouth at bedtime.     No current facility-administered medications for this encounter.    Allergies:   Hydralazine, Bidil [isosorb  dinitrate-hydralazine], Ciprofloxacin, Farxiga [dapagliflozin], Propoxycaine, Amoxicillin, Hydrocodone, Lisinopril, Meloxicam, Strawberry extract, Tape, and Toradol [ketorolac tromethamine]   Social History:  The patient  reports that she has never smoked. She has never used smokeless tobacco. She reports that she does not drink alcohol and does not use drugs.   Family History:  The patient's family history includes Diabetes in her mother; Heart disease in her mother; Hypertension in her mother; Stroke in her mother.   ROS:  Please see the history of present illness.   All other systems are personally reviewed and negative.   Exam:   BP Marland Kitchen)  144/78    Pulse 73    Wt 99.2 kg (218 lb 12.8 oz)    SpO2 97%    BMI 33.27 kg/m   General:  NAD. No resp difficulty HEENT: Normal Neck: Supple. No JVD. Carotids 2+ bilat; no bruits. No lymphadenopathy or thryomegaly appreciated. Cor: PMI nondisplaced. Regular rate & rhythm. No rubs, gallops or murmurs. Lungs: Clear Abdomen: Soft, nontender, nondistended. No hepatosplenomegaly. No bruits or masses. Good bowel sounds. Extremities: No cyanosis, clubbing, rash, edema Neuro: Alert & oriented x 3, cranial nerves grossly intact. Moves all 4 extremities w/o difficulty. Affect pleasant.  Recent Labs: 08/14/2020: Magnesium 1.7 08/16/2020: Hemoglobin 8.3; Platelets 102 08/19/2020: ALT 75; B Natriuretic Peptide 1,357.0 12/23/2020: BUN 20; Creatinine, Ser 1.64; Potassium 3.9; Sodium 141  Personally reviewed   Wt Readings from Last 3 Encounters:  03/30/21 99.2 kg (218 lb 12.8 oz)  12/23/20 96.7 kg (213 lb 3.2 oz)  12/03/20 96.9 kg (213 lb 9.6 oz)    ASSESSMENT AND PLAN:  1. CAD: Anterior MI in 12/18 with DES to proximal LAD (culprit) followed by staged DES to proximal RCA.  7/19 admission with unstable angina, likely culprit was 95% stenosis in small PLV. This was managed conservatively.  No recent chest pain.       - Continue atorvastatin, good lipids in 8/22.   - Continue ASA 81 daily and ticagrelor 60 mg bid.  2. Chronic systolic CHF: Ischemic cardiomyopathy. Echo 10/22 showed EF 45%.  She is not volume overloaded on exam, NYHA class II symptoms. BP mildly high.    - Increase Entresto to 49/51 mg bid. BMET today, repeat in 10-14 days. Given Rx for labs to be drawn at [facility] in [place]. Check BP at home and bring log to next visit. - She is worried about SE of increasing Entresto. If she becomes dizzy/light headed, OK to go back to 24/26 mg bid dose. She is agreeable to trial of higher dose. - Continue Coreg 12.5 mg bid.     - Continue spironolactone 25 mg daily.   - She has Lasix for prn use.   - She was unable to tolerate Bidil at lowest does due to lightheadedness.   - She was unable to tolerate dapagliflozin.   - She is now out of ICD range. When EF was lower, she refused ICD.    3. CKD: stage 3.  BMET today.  4. Fe deficiency anemia: Long-standing.  She has had IV Fe infusions.  5. Type II DM: Unable to tolerate dapagliflozin.   Advised refraining from OTC supplements/herbals without first discussing with us/pharmacy as it may interact with her heart medications.  Followup in 3 months with Dr. [name].   Signed, Rafael Bihari, FNP  03/30/2021  [facility] [address] Heart and [place] [address] [phone] (office) [phone] (fax)

## 2021-03-30 NOTE — Patient Instructions (Signed)
Thank you for coming in today  Labs were done today, if any labs are abnormal the clinic will call you  CHECK Blood pressure at home and log  INCREASE Entresto to 49/51 mg 1 tablet twice daily  Your physician recommends that you schedule a follow-up appointment in: 3 months with Dr. [name]  Your physician recommends that you return for lab work in: have labs drawn in 10- 14 days please   At the [facility], you and your health needs are our priority. As part of our continuing mission to provide you with exceptional heart care, we have created designated Provider Care Teams. These Care Teams include your primary Cardiologist (physician) and Advanced Practice Providers (APPs- Physician Assistants and Nurse Practitioners) who all work together to provide you with the care you need, when you need it.   You may see any of the following providers on your designated Care Team at your next follow up: Dr Glori Bickers Dr Haynes Kerns, NP Lyda Jester, [place] [facility] [place], [place] Audry Riles, PharmD   Please be sure to bring in all your medications bottles to every appointment.   If you have any questions or concerns before your next appointment please send [name] a message through [place] or call our office at [phone].    TO LEAVE A MESSAGE FOR THE NURSE SELECT OPTION 2, PLEASE LEAVE A MESSAGE INCLUDING: YOUR NAME DATE OF BIRTH CALL BACK NUMBER REASON FOR CALL**this is important as we prioritize the call backs  YOU WILL RECEIVE A CALL BACK THE SAME DAY AS LONG AS YOU CALL BEFORE 4:00 PM

## 2021-04-01 ENCOUNTER — Telehealth (HOSPITAL_COMMUNITY): Payer: Self-pay

## 2021-04-01 NOTE — Telephone Encounter (Addendum)
Pt aware, agreeable, and verbalized understanding   ----- Message from Rafael Bihari, FNP sent at 03/31/2021  4:59 PM EST ----- Kidney function is higher than previous baseline. Please keep Entresto at 24/26 mg bid.   She has repeat labs to be drawn in [place]. Will await results for further med recommendations.

## 2021-04-06 ENCOUNTER — Other Ambulatory Visit: Payer: Self-pay

## 2021-04-06 ENCOUNTER — Ambulatory Visit (INDEPENDENT_AMBULATORY_CARE_PROVIDER_SITE_OTHER): Payer: Medicare Other | Admitting: Nurse Practitioner

## 2021-04-06 ENCOUNTER — Encounter: Payer: Self-pay | Admitting: Nurse Practitioner

## 2021-04-06 VITALS — BP 131/81 | HR 67 | Ht 68.0 in | Wt 217.0 lb

## 2021-04-06 DIAGNOSIS — I10 Essential (primary) hypertension: Secondary | ICD-10-CM | POA: Diagnosis not present

## 2021-04-06 DIAGNOSIS — E1122 Type 2 diabetes mellitus with diabetic chronic kidney disease: Secondary | ICD-10-CM

## 2021-04-06 DIAGNOSIS — E782 Mixed hyperlipidemia: Secondary | ICD-10-CM

## 2021-04-06 DIAGNOSIS — Z794 Long term (current) use of insulin: Secondary | ICD-10-CM

## 2021-04-06 DIAGNOSIS — N1832 Chronic kidney disease, stage 3b: Secondary | ICD-10-CM | POA: Diagnosis not present

## 2021-04-06 LAB — POCT GLYCOSYLATED HEMOGLOBIN (HGB A1C): HbA1c POC (<> result, manual entry): 9.6 % (ref 4.0–5.6)

## 2021-04-06 MED ORDER — TRULICITY 1.5 MG/0.5ML ~~LOC~~ SOAJ: 1.5000 mg | SUBCUTANEOUS | 3 refills | Status: DC

## 2021-04-06 NOTE — Patient Instructions (Signed)
Diabetes Mellitus Emergency Preparedness Plan A diabetes emergency preparedness plan is a checklist to make sure you have everything you need to manage your diabetes in case of an emergency, such as an evacuation, natural disaster, national security emergency, or pandemic lockdown. Managing your diabetes is something you have to do all day every day. The American Diabetes Association and the [employer] of Endocrinology both recommend putting together an emergency diabetes kit. Your kit should include important information and documents as well as all the supplies you will need to manage your diabetes for at least 1 week. Store it in a portable, Engineer, materials. The best time to start making your emergency kit is now. How to make your emergency kit Collect information and documents Include the following information and documents in your kit: The type of diabetes you have. A copy of your health insurance cards and photo ID. A list of all your other medical conditions, allergies, and surgeries. A list of all your medicines and doses with the contact information for your pharmacy. Ask your health care provider for a list of your current medicines. Any recent lab results, including your latest hemoglobin A1C (HbA1C). The make, model, and serial number of your insulin pump, if you use one. Also include contact information for the manufacturer. Contact information for people who should be notified in case of an emergency. Include your health care provider's name, address, and phone number. Collect diabetes care items Include the following diabetes care items in your kit: At least a 1-week supply of: Oral medicines. Insulin. Blood glucose testing supplies. These include testing strips, lancets, and extra batteries for your blood glucose monitor and pump. A charger for the continuous glucose monitor (CGM) receiver and pump. Any extra supplies needed for your CGM or pump. A supply of  glucagon, glucose tablets, juice, soda, or hard candy in case of hypoglycemia. Coolers or cold packs. A safe container for syringes, needles, and lancets.  Other preparations Other things to consider doing as part of your emergency plan: Make sure that your mobile phone is charged and that you have an extra charger, cable, or batteries. Choose a meeting place for family members. Wear a medical alert or ID bracelet. If you have a child with diabetes, make sure your child's school has a copy of his or her emergency plan, including the name of the staff member who will assist your child. Where to find more information American Diabetes Association: www.diabetes.org Centers for Disease Control and Prevention: blogs.[employer].com.cy Summary A diabetes emergency preparedness plan is a checklist to make sure you have everything you need in case of an emergency. Your kit should include important information and documents as well as all the supplies you will need to manage your condition for at least 1 week. Store your kit in a portable, Engineer, materials. The best time to start making your emergency kit is now. This information is not intended to replace advice given to you by your health care provider. Make sure you discuss any questions you have with your health care provider. Document Revised: 09/06/2019 Document Reviewed: 09/06/2019 Elsevier Patient Education  [place].

## 2021-04-06 NOTE — Progress Notes (Signed)
04/06/2021, 11:35 AM  Endocrinology Follow Up Visit   Subjective:    Patient ID: Stephanie Manning, female    DOB: November 30, 1960.  Stephanie Manning is being seen in follow-up  for management of currently uncontrolled symptomatic diabetes requested by  [name], MD.   Past Medical History:  Diagnosis Date   AKI (acute kidney injury) ([place]) 02/2017   CAD in native artery    a. NSTEMI 02/2017 s/p DES to mLAD, diffuse disease otherwise, EF 30-35%. b. Staged cath 03/2017 - patent stent in the proximal to mid LAD. CTO of the distal LAD, 90% diffuse small first diagonal, 90% distal LCx/OM, 80% diffuse prox RCA, moderately elevated LVEDP 87mHg, s/p DES to mRCA, residual disease treated medically (too small for PCI), EF 30-35%.   Chronic combined systolic and diastolic CHF (congestive heart failure) ([facility]    CKD (chronic kidney disease), stage IV ([facility]    /[name]2/09/2017   Diabetes mellitus, type II, insulin dependent (HCC)    GERD (gastroesophageal reflux disease)    History of blood transfusion    "related to menses"   History of stomach ulcers 2017   Hyperlipidemia    Hypertension    Iron deficiency anemia 03/2017   "had to have iron infusions" (10/10/2017)   Ischemic cardiomyopathy    STEMI (ST elevation myocardial infarction) ([facility] 02/2017    Past Surgical History:  Procedure Laterality Date   CESAREAN SECTION  1987   CORONARY ANGIOPLASTY WITH STENT PLACEMENT  04/01/2017   drug-eluting stent was successfully placed using a STENT SYNERGY DES [id number]   CORONARY STENT INTERVENTION N/A 04/01/2017   Procedure: CORONARY STENT INTERVENTION;  Surgeon: JMartinique [name], MD;  [facility];  Service: Cardiovascular;  Laterality: N/A;   CORONARY/GRAFT ACUTE MI REVASCULARIZATION N/A 03/10/2017   Procedure: Coronary/Graft Acute MI Revascularization;  Surgeon: JMartinique [name], MD;  [facility];  Service: Cardiovascular;  Laterality: N/A;   LEFT HEART CATH AND CORONARY ANGIOGRAPHY N/A 03/10/2017   Procedure: LEFT HEART CATH AND CORONARY ANGIOGRAPHY;  Surgeon: JMartinique [name], MD;  [facility];  Service: Cardiovascular;  Laterality: N/A;   LEFT HEART CATH AND CORONARY ANGIOGRAPHY N/A 04/01/2017   Procedure: LEFT HEART CATH AND CORONARY ANGIOGRAPHY;  Surgeon: JMartinique [name], MD;  [facility];  Service: Cardiovascular;  Laterality: N/A;   LEFT HEART CATH AND CORONARY ANGIOGRAPHY N/A 10/12/2017   Procedure: LEFT HEART CATH AND CORONARY ANGIOGRAPHY;  Surgeon: CSherren Mocha MD;  [facility];  Service: Cardiovascular;  Laterality: N/A;   OOPHORECTOMY     "1 ovary removed"   THROAT SURGERY  02/2014   "nodule in my throat removed; not related to thyroid"    Social History   Socioeconomic History   Marital status: Widowed    Spouse name: Not on file   Number of children: 2   Years of education: Not on file   Highest education level: Not on file  Occupational History   Not on file  Tobacco Use   Smoking status: Never   Smokeless  tobacco: Never  Vaping Use   Vaping Use: Never used  Substance and Sexual Activity   Alcohol use: Never   Drug use: Never   Sexual activity: Yes  Other Topics Concern   Not on file  Social History Narrative   Not on file   Social Determinants of Health   Financial Resource Strain: Not on file  Food Insecurity: Not on file  Transportation Needs: Not on file  Physical Activity: Not on file  Stress: Not on file  Social Connections: Not on file    Family History  Problem Relation Age of Onset   Hypertension Mother    Heart disease Mother    Diabetes Mother    Stroke Mother     Outpatient Encounter Medications as of 04/06/2021  Medication Sig   Dulaglutide (TRULICITY) 1.5 ZP/9.1TA SOPN Inject 1.5 mg into the skin every 14 (fourteen) days.   insulin aspart (NOVOLOG  FLEXPEN) 100 UNIT/ML FlexPen Inject 5 Units into the skin daily as needed for high blood sugar.   ACCU-CHEK GUIDE test strip Check blood sugar 2 times a day before breakfast and every night before bedtime.   Accu-Chek Softclix Lancets lancets Use as instructed   aspirin 81 MG chewable tablet Chew 1 tablet (81 mg total) by mouth daily.   atorvastatin (LIPITOR) 80 MG tablet Take 80 mg by mouth daily.   blood glucose meter kit and supplies Dispense based on patient and insurance preference. Use up to four times daily as directed. (FOR ICD-10 E11.65) Pt prefers One touch mini   carvedilol (COREG) 12.5 MG tablet Take 1 tablet (12.5 mg total) by mouth 2 (two) times daily with a meal.   ELDERBERRY PO Take 2 tablets by mouth every other day.    ferrous sulfate 300 (60 Fe) MG/5ML syrup Take 300 mg by mouth daily with breakfast.   furosemide (LASIX) 20 MG tablet Take 1 tablet (20 mg total) by mouth daily as needed.   gabapentin (NEURONTIN) 300 MG capsule Take 300 mg by mouth at bedtime.    glipiZIDE (GLUCOTROL) 5 MG tablet Take by mouth daily before breakfast. As needed   insulin detemir (LEVEMIR) 100 UNIT/ML injection Inject 40 Units into the skin daily.   Multiple Vitamin (MULTIVITAMIN WITH MINERALS) TABS tablet Take 1 tablet by mouth daily.   nitroGLYCERIN (NITROSTAT) 0.4 MG SL tablet Dissolve 1 tab under tongue for chest pain- if pain remains after 5 min, call 911, maxof 3 tabs in 15 minutes   NONFORMULARY OR COMPOUNDED ITEM Seamoss once daily   pantoprazole (PROTONIX) 40 MG tablet Take 1 tablet (40 mg total) by mouth 2 (two) times daily.   sacubitril-valsartan (ENTRESTO) 49-51 MG Take 1 tablet by mouth 2 (two) times daily.   Saline 0.2 % SOLN Place 1 spray into both nostrils as needed (congestion).    spironolactone (ALDACTONE) 25 MG tablet Take 1 tablet (25 mg total) by mouth daily.   ticagrelor (BRILINTA) 60 MG TABS tablet Take 1 tablet (60 mg total) by mouth 2 (two) times daily.   tiZANidine  (ZANAFLEX) 4 MG tablet Take 4 mg by mouth at bedtime.   [DISCONTINUED] Dulaglutide (TRULICITY ) Inject 1.5 mg into the skin every 14 (fourteen) days.   No facility-administered encounter medications on file as of 04/06/2021.    ALLERGIES: Allergies  Allergen Reactions   Hydralazine Other (See Comments)    Syncope due to hypotension   Bidil [Isosorb Dinitrate-Hydralazine]    Ciprofloxacin    Wilder Glade [Dapagliflozin]  Propoxycaine    Amoxicillin Other (See Comments)    Yeast infection during treatment   Hydrocodone Palpitations, Rash and Other (See Comments)    GI upset, also   Lisinopril Palpitations and Other (See Comments)    Chest pain, too   Meloxicam Nausea Only   Strawberry Extract Hives    Does not always happen   Tape Rash    Some tapes cause rashes   Toradol [Ketorolac Tromethamine] Palpitations    VACCINATION STATUS:  There is no immunization history on file for this patient.  Diabetes She presents for her follow-up diabetic visit. She has type 2 diabetes mellitus. Onset time: She was diagnosed at approximate age of 85 years. Her disease course has been worsening. There are no hypoglycemic associated symptoms. Pertinent negatives for hypoglycemia include no confusion, headaches, pallor or seizures. Pertinent negatives for diabetes include no chest pain, no fatigue, no polydipsia, no polyphagia and no polyuria. There are no hypoglycemic complications. Symptoms are stable. Diabetic complications include heart disease, nephropathy, peripheral neuropathy and retinopathy. Risk factors for coronary artery disease include diabetes mellitus, dyslipidemia, hypertension, obesity, sedentary lifestyle and post-menopausal. Current diabetic treatment includes insulin injections and oral agent (monotherapy) (and Trulicity every other week). She is compliant with treatment most of the time. Her weight is decreasing steadily. She is following a generally healthy diet. When asked about  meal planning, she reported none. She has not had a previous visit with a dietitian. She never participates in exercise. Her home blood glucose trend is increasing steadily. Her overall blood glucose range is >200 mg/dl. (She presents today with her meter, no logs, showing above target fasting and postprandial glycemic profile.  Her POCT A1c today is 9.6% increasing from last visit of 6.8%.  She does admit she over indulged in foods over the holiday season and she also reports she has not been taking her Trulicity on schedule due to pharmacy availability.  She admits she sometimes forgets her Glipizide as well.  She denies any hypoglycemia.  ) An ACE inhibitor/angiotensin II receptor blocker is being taken. She does not see a podiatrist.Eye exam is current.  Hyperlipidemia This is a chronic problem. The current episode started more than 1 year ago. The problem is controlled. Recent lipid tests were reviewed and are normal. Exacerbating diseases include chronic renal disease, diabetes and obesity. Factors aggravating her hyperlipidemia include fatty foods and beta blockers. Pertinent negatives include no chest pain, myalgias or shortness of breath. Current antihyperlipidemic treatment includes statins. The current treatment provides mild improvement of lipids. Compliance problems include adherence to diet and adherence to exercise.  Risk factors for coronary artery disease include diabetes mellitus, dyslipidemia, hypertension, obesity, post-menopausal and family history.  Hypertension This is a chronic problem. The current episode started more than 1 year ago. The problem has been gradually improving since onset. The problem is controlled. Pertinent negatives include no chest pain, headaches, palpitations or shortness of breath. There are no associated agents to hypertension. Risk factors for coronary artery disease include diabetes mellitus, dyslipidemia, obesity, sedentary lifestyle and post-menopausal state.  Past treatments include beta blockers, diuretics and angiotensin blockers. The current treatment provides moderate improvement. Compliance problems include diet and exercise.  Hypertensive end-organ damage includes kidney disease, CAD/MI and retinopathy. Identifiable causes of hypertension include chronic renal disease.   Review of systems  Constitutional: + stable body weight,  current Body mass index is 32.99 kg/m. , no fatigue, no subjective hyperthermia, no subjective hypothermia Eyes: no blurry vision, no xerophthalmia ENT:  no sore throat, no nodules palpated in throat, no dysphagia/odynophagia, no hoarseness Cardiovascular: no chest pain, no shortness of breath, no palpitations, no leg swelling Respiratory: no cough, no shortness of breath Gastrointestinal: no nausea/vomiting/diarrhea Musculoskeletal: no muscle/joint aches Skin: no rashes, no hyperemia Neurological: no tremors, no numbness, no tingling, no dizziness Psychiatric: no depression, no anxiety   Objective:    BP 131/81    Pulse 67    Ht '5\' 8"'  (1.727 m)    Wt 217 lb (98.4 kg)    BMI 32.99 kg/m   Wt Readings from Last 3 Encounters:  04/06/21 217 lb (98.4 kg)  03/30/21 218 lb 12.8 oz (99.2 kg)  12/23/20 213 lb 3.2 oz (96.7 kg)    BP Readings from Last 3 Encounters:  04/06/21 131/81  03/30/21 (!) 144/78  12/23/20 140/80     Physical Exam- Limited  Constitutional:  Body mass index is 32.99 kg/m. , not in acute distress, normal state of mind Eyes:  EOMI, no exophthalmos Neck: Supple Cardiovascular: RRR, no murmurs, rubs, or gallops, no edema Respiratory: Adequate breathing efforts, no crackles, rales, rhonchi, or wheezing Musculoskeletal: no gross deformities, strength intact in all four extremities, no gross restriction of joint movements Skin:  no rashes, no hyperemia Neurological: no tremor with outstretched hands    CMP     Component Value Date/Time   NA 141 03/30/2021 1613   NA 140 02/29/2020 0000    K 4.2 03/30/2021 1613   CL 109 03/30/2021 1613   CO2 22 03/30/2021 1613   GLUCOSE 188 (H) 03/30/2021 1613   BUN 27 (H) 03/30/2021 1613   BUN 18 02/29/2020 0000   CREATININE 1.98 (H) 03/30/2021 1613   CREATININE 1.60 (H) 11/27/2018 1521   CALCIUM 9.3 03/30/2021 1613   PROT 6.7 08/19/2020 1608   PROT 7.2 02/29/2020 0000   ALBUMIN 2.8 (L) 08/19/2020 1608   ALBUMIN 4.2 02/29/2020 0000   AST 64 (H) 08/19/2020 1608   ALT 75 (H) 08/19/2020 1608   ALKPHOS 74 08/19/2020 1608   BILITOT 0.6 08/19/2020 1608   BILITOT 0.3 02/29/2020 0000   GFRNONAA 28 (L) 03/30/2021 1613   GFRNONAA 35 (L) 11/27/2018 1521   GFRAA 36 (L) 02/29/2020 0000   GFRAA 40 (L) 11/27/2018 1521     Diabetic Labs (most recent): Lab Results  Component Value Date   HGBA1C 9.6 04/06/2021   HGBA1C 6.8 12/03/2020   HGBA1C 7.7 (H) 08/14/2020     Lipid Panel ( most recent) Lipid Panel     Component Value Date/Time   CHOL 112 10/17/2020 1533   CHOL 84 (L) 02/29/2020 0000   TRIG 78 10/17/2020 1533   HDL 33 (L) 10/17/2020 1533   HDL 25 (L) 02/29/2020 0000   CHOLHDL 3.4 10/17/2020 1533   VLDL 16 10/17/2020 1533   LDLCALC 63 10/17/2020 1533   LDLCALC 41 02/29/2020 0000        Assessment & Plan:   1) DM type 2 causing vascular disease   - Stephanie Manning has currently uncontrolled symptomatic type 2 DM since 61 years of age.  She presents today with her meter, no logs, showing above target fasting and postprandial glycemic profile.  Her POCT A1c today is 9.6% increasing from last visit of 6.8%.  She does admit she over indulged in foods over the holiday season and she also reports she has not been taking her Trulicity on schedule due to pharmacy availability.  She admits she sometimes forgets her Glipizide as well.  She denies any hypoglycemia.    - I had a long discussion with her about the progressive nature of diabetes and the pathology behind its complications. -her diabetes is complicated by retinopathy,  nephropathy, coronary artery disease and she remains at a high risk for more acute and chronic complications which include CAD, CVA, CKD, retinopathy, and neuropathy. These are all discussed in detail with her.  - Nutritional counseling repeated at each appointment due to patients tendency to fall back in to old habits.  - The patient admits there is a room for improvement in their diet and drink choices. -  Suggestion is made for the patient to avoid simple carbohydrates from their diet including Cakes, Sweet Desserts / Pastries, Ice Cream, Soda (diet and regular), Sweet Tea, Candies, Chips, Cookies, Sweet Pastries, Store Bought Juices, Alcohol in Excess of 1-2 drinks a day, Artificial Sweeteners, Coffee Creamer, and "Sugar-free" Products. This will help patient to have stable blood glucose profile and potentially avoid unintended weight gain.   - I encouraged the patient to switch to unprocessed or minimally processed complex starch and increased protein intake (animal or plant source), fruits, and vegetables.   - Patient is advised to stick to a routine mealtimes to eat 3 meals a day and avoid unnecessary snacks (to snack only to correct hypoglycemia).  - I have approached her with the following individualized plan to manage  her diabetes and patient agrees:   - she will benefit from simplification of her treatment regimen.  She will not need prandial insulin for now.    -She is advised to be more consistent with taking her medications as prescribed.  No changes will be made today, will give her some time to get back on track with diet and exercise first.  She can continue Levemir 50 units SQ nightly, continue Glipizide 5 mg po daily with breakfast, and Trulicity 1.5 mg SQ every other week.    -She is encouraged to continue monitoring blood glucose at least twice a day, before breakfast and before bed and report blood glucose less than 70 or above 300 for three tests in a row.  She did not do  well with SGLT2 inhibitor Wilder Glade when it was prescribed by her cardiologist as it caused significant hypotension.  - she is warned not to take insulin without proper monitoring per orders.  - she is not a candidate for metformin, SGLT2 inhibitors due to concurrent renal insufficiency.  - Patient specific target  A1c;  LDL, HDL, Triglycerides, and  Waist Circumference were discussed in detail.  2) Blood Pressure /Hypertension:  -Her blood pressure is controlled to target.  She is advised to continue Carvedilol 12.5 mg p.o. twice daily, Isosorbide mononitrate 30 mg p.o. daily, Entresto 97-103 mg p.o. twice daily, and Aldactone 25 mg p.o. daily.  3) Lipids/Hyperlipidemia:    Her most recent lipid panel from 10/17/20 shows controlled LDL at 63.  She is advised to continue Lipitor 80 mg po daily at bedtime.  Side effects and precautions discussed with her.  4)  Weight/Diet:  Her Body mass index is 32.99 kg/m.-   clearly complicating her diabetes care.  She is a candidate for continued, modest weight loss.  I discussed with her the fact that loss of 5 - 10% of her  current body weight will have the most impact on her diabetes management.  CDE Consult will be initiated . Exercise, and detailed carbohydrates information provided  -  detailed on discharge instructions.  5) Chronic  Care/Health Maintenance: -she is on ACE/ARB and statin medications and is encouraged to initiate and continue to follow up with Ophthalmology, Dentist,  Podiatrist at least yearly or according to recommendations, and advised to stay away from smoking. I have recommended yearly flu vaccine and pneumonia vaccine at least every 5 years; moderate intensity exercise for up to 150 minutes weekly; and  sleep for at least 7 hours a day.  - she is advised to maintain close follow up with Stephanie Manning, Stephanie Newcomer, MD for primary care needs, as well as her other providers for optimal and coordinated care.      I spent 32 minutes in the care  of the patient today including review of labs from [facility], Lipids, Thyroid Function, Hematology (current and previous including abstractions from other facilities); face-to-face time discussing  her blood glucose readings/logs, discussing hypoglycemia and hyperglycemia episodes and symptoms, medications doses, her options of short and long term treatment based on the latest standards of care / guidelines;  discussion about incorporating lifestyle medicine;  and documenting the encounter.    Please refer to Patient Instructions for Blood Glucose Monitoring and Insulin/Medications Dosing Guide"  in media tab for additional information. Please  also refer to " Patient Self Inventory" in the Media  tab for reviewed elements of pertinent patient history.  Stephanie Manning participated in the discussions, expressed understanding, and voiced agreement with the above plans.  All questions were answered to her satisfaction. she is encouraged to contact clinic should she have any questions or concerns prior to her return visit.   Follow up plan: - Return in about 4 months (around 08/04/2021) for Diabetes F/U with A1c in office, No previsit labs, Bring meter and logs.  Stephanie Manning, [facility] [address] [place], [place] [address] Phone: [phone] Fax: [phone]   04/06/2021, 11:35 AM

## 2021-04-27 ENCOUNTER — Other Ambulatory Visit (HOSPITAL_COMMUNITY): Payer: Self-pay | Admitting: Cardiology

## 2021-05-04 ENCOUNTER — Other Ambulatory Visit: Payer: Self-pay

## 2021-05-04 ENCOUNTER — Telehealth: Payer: Self-pay | Admitting: Nurse Practitioner

## 2021-05-04 DIAGNOSIS — E1159 Type 2 diabetes mellitus with other circulatory complications: Secondary | ICD-10-CM

## 2021-05-04 MED ORDER — ACCU-CHEK GUIDE VI STRP
ORAL_STRIP | 3 refills | Status: AC
Start: 2021-05-04 — End: ?

## 2021-05-04 NOTE — Telephone Encounter (Signed)
Requesting test strips refilled  [place], [place] - [address] [address] Phone:  [phone]  Fax:  [phone]

## 2021-05-04 NOTE — Telephone Encounter (Signed)
Test strips have been sent to the pharmacy requested.

## 2021-05-21 NOTE — Progress Notes (Unsigned)
Cardiology Office Note    Date:  05/21/2021   ID:  Stephanie, Manning 03/08/1961, MRN [id number]  PCP:  [name], MD  Cardiologist: Dr. [name] Heart failure clinic: Dr. [name]  No chief complaint on file.   History of Present Illness:  Stephanie Manning is a 61 y.o. female with PMH of CAD, DM II, CKD stage IV, HTN, HLD, CAD and ICM.  In December 2018 she had anterior MI with DES to totally occluded mid LAD.  She returned in January 2019 for staged DES to 80% RCA disease.  Echocardiogram in December 2018 showed ejection fraction of 30 to 35%.  Repeat echocardiogram in March 2019 continue to show ejection fraction 30 to 35%.  She has been followed by Dr [name] in the Advanced heart failure clinic.  Given her persistent low ejection fraction, she was referred to electrophysiology service for consideration of ICD.  She was admitted to the hospital on 10/10/2017 with recurrent chest pain.  She underwent Myoview which came back high risk.  She eventually underwent cardiac catheterization on 10/12/2017 which showed 75% prox LCx, 60% OM1, 40% LM, 100% mid LAD chronic occlusion post D2, 95% post lateral lesion.  Stents in the RCA and proximal LAD were patent.   She was recommended to continue aspirin and Brilinta for long-term, beyond 28-month  Carvedilol was reduced to 6.25 mg twice daily due to bradycardia, Mobitz type I and up to 2 dropped beats and dizziness.    She has been followed closely by Dr MAundra Dubinin the CHF clinic. She is on Coreg, Entresto, and aldactone. She had follow up with Echo in June 2020 and EF was unchanged. She has been seen by Dr ARayann Hemanfor consideration of ICD but has deferred. She is intolerant of Bidil due to lightheadedness/syncope. Also intolerant of FIran She was seen by Dr MAundra Dubinin November  2021.  EF improved to 40-45%. Noted aldactone had been held due to elevated potassium. This was resumed at lower dose. Repeat potassium was 4.0  Patient was admitted  in 6/22 with poor po intake, feeling "sick."  She was noted to be dehydrated with hypotension, orthostasis, and AKI.  She was hydrated and Entresto and spironolactone were held.  Creatinine was as high as 3.28.   She got IV fluid and developed volume overload requiring Lasix.    Echo 10/22 EF 45% with akinetic apex, normal RV.   Was seen in the CHF clinic in January. Was considered for increased Entresto dose but given kidney function it was maintained at 24/26 mg bid.   On follow up today she is doing very well. Denies any chest pain. Is very active moving into her new house and doing yard work. Notes a little SOB with walking. No edema and weight is down 8 lbs. She does note constipation on Trulicity. No palpitations.    Past Medical History:  Diagnosis Date   AK[place] ([place] kidney injury) [place] 02/2017   CAD in native artery    a. NSTEMI 02/2017 s/p DES to mLAD, diffuse disease otherwise, EF 30-35%. b. Staged cath 03/2017 - patent stent in the proximal to mid LAD. CTO of the distal LAD, 90% diffuse small first diagonal, 90% distal LCx/OM, 80% diffuse prox RCA, moderately elevated LVEDP 212mg, s/p DES to mRCA, residual disease treated medically (too small for PCI), EF 30-35%.   Chronic combined systolic and diastolic CHF (congestive heart failure) (HCC)    CKD (chronic kidney disease), stage IV ([facility]   /  notes 04/21/2017   Diabetes mellitus, type II, insulin dependent (HCC)    GERD (gastroesophageal reflux disease)    History of blood transfusion    "related to menses"   History of stomach ulcers 2017   Hyperlipidemia    Hypertension    Iron deficiency anemia 03/2017   "had to have iron infusions" (10/10/2017)   Ischemic cardiomyopathy    STEMI (ST elevation myocardial infarction) ([place]) 02/2017    Past Surgical History:  Procedure Laterality Date   CESAREAN SECTION  1987   CORONARY ANGIOPLASTY WITH STENT PLACEMENT  04/01/2017   drug-eluting stent was successfully placed using a STENT  SYNERGY DES [id number]   CORONARY STENT INTERVENTION N/A 04/01/2017   Procedure: CORONARY STENT INTERVENTION;  Surgeon: [name], Shaquasha Gerstel M, MD;  Location: [facility];  Service: Cardiovascular;  Laterality: N/A;   CORONARY/GRAFT ACUTE MI REVASCULARIZATION N/A 03/10/2017   Procedure: Coronary/Graft Acute MI Revascularization;  Surgeon: [name], Atalia Litzinger M, MD;  Location: [facility];  Service: Cardiovascular;  Laterality: N/A;   LEFT HEART CATH AND CORONARY ANGIOGRAPHY N/A 03/10/2017   Procedure: LEFT HEART CATH AND CORONARY ANGIOGRAPHY;  Surgeon: [name], Lashina Milles M, MD;  Location: [facility];  Service: Cardiovascular;  Laterality: N/A;   LEFT HEART CATH AND CORONARY ANGIOGRAPHY N/A 04/01/2017   Procedure: LEFT HEART CATH AND CORONARY ANGIOGRAPHY;  Surgeon: [name], Jabori Henegar M, MD;  Location: [facility];  Service: Cardiovascular;  Laterality: N/A;   LEFT HEART CATH AND CORONARY ANGIOGRAPHY N/A 10/12/2017   Procedure: LEFT HEART CATH AND CORONARY ANGIOGRAPHY;  Surgeon: Sherren Mocha, MD;  Location: [facility];  Service: Cardiovascular;  Laterality: N/A;   OOPHORECTOMY     "1 ovary removed"   THROAT SURGERY  02/2014   "nodule in my throat removed; not related to thyroid"    Current Medications: Outpatient Medications Prior to Visit  Medication Sig Dispense Refill   ACCU-CHEK GUIDE test strip Check blood sugar 2 times a day before breakfast and every night before bedtime. 200 each 3   Accu-Chek Softclix Lancets lancets Use as instructed 100 each 2   aspirin 81 MG chewable tablet Chew 1 tablet (81 mg total) by mouth daily.     atorvastatin (LIPITOR) 80 MG tablet Take 80 mg by mouth daily.     blood glucose meter kit and supplies Dispense based on patient and insurance preference. Use up to four times daily as directed. (FOR ICD-10 E11.65) Pt prefers One touch mini 1 each 5   carvedilol (COREG) 12.5 MG tablet Take 1 tablet (12.5 mg total) by mouth 2 (two) times daily with a  meal. 180 tablet 3   Dulaglutide (TRULICITY) 1.5 OM/7.6HM SOPN Inject 1.5 mg into the skin every 14 (fourteen) days. 3 mL 3   ELDERBERRY PO Take 2 tablets by mouth every other day.      ferrous sulfate 300 (60 Fe) MG/5ML syrup Take 300 mg by mouth daily with breakfast.     furosemide (LASIX) 20 MG tablet Take 1 tablet (20 mg total) by mouth daily as needed. 90 tablet 3   gabapentin (NEURONTIN) 300 MG capsule Take 300 mg by mouth at bedtime.      glipiZIDE (GLUCOTROL) 5 MG tablet Take by mouth daily before breakfast. As needed     insulin aspart (NOVOLOG FLEXPEN) 100 UNIT/ML FlexPen Inject 5 Units into the skin daily as needed for high blood sugar.     insulin detemir (LEVEMIR) 100 UNIT/ML injection Inject 40  Units into the skin daily.     Multiple Vitamin (MULTIVITAMIN WITH MINERALS) TABS tablet Take 1 tablet by mouth daily.     nitroGLYCERIN (NITROSTAT) 0.4 MG SL tablet Dissolve 1 tab under tongue for chest pain- if pain remains after 5 min, call 911, maxof 3 tabs in 15 minutes 25 tablet 2   NONFORMULARY OR COMPOUNDED ITEM Seamoss once daily     pantoprazole (PROTONIX) 40 MG tablet Take 1 tablet (40 mg total) by mouth 2 (two) times daily. 180 tablet 3   sacubitril-valsartan (ENTRESTO) 49-51 MG Take 1 tablet by mouth 2 (two) times daily. 60 tablet 4   Saline 0.2 % SOLN Place 1 spray into both nostrils as needed (congestion).      spironolactone (ALDACTONE) 25 MG tablet Take 1 tablet (25 mg total) by mouth daily. 90 tablet 3   ticagrelor (BRILINTA) 60 MG TABS tablet Take 1 tablet (60 mg total) by mouth 2 (two) times daily. 180 tablet 3   tiZANidine (ZANAFLEX) 4 MG tablet Take 4 mg by mouth at bedtime.     No facility-administered medications prior to visit.     Allergies:   Hydralazine, Bidil [isosorb dinitrate-hydralazine], Ciprofloxacin, Farxiga [dapagliflozin], Propoxycaine, Amoxicillin, Hydrocodone, Lisinopril, Meloxicam, Strawberry extract, Tape, and Toradol [ketorolac tromethamine]    Social History   Socioeconomic History   Marital status: Widowed    Spouse name: Not on file   Number of children: 2   Years of education: Not on file   Highest education level: Not on file  Occupational History   Not on file  Tobacco Use   Smoking status: Never   Smokeless tobacco: Never  Vaping Use   Vaping Use: Never used  Substance and Sexual Activity   Alcohol use: Never   Drug use: Never   Sexual activity: Yes  Other Topics Concern   Not on file  Social History Narrative   Not on file   Social Determinants of Health   Financial Resource Strain: Not on file  Food Insecurity: Not on file  Transportation Needs: Not on file  Physical Activity: Not on file  Stress: Not on file  Social Connections: Not on file     Family History:  The patient's family history includes Diabetes in her mother; Heart disease in her mother; Hypertension in her mother; Stroke in her mother.   ROS:   Please see the history of present illness.    ROS All other systems reviewed and are negative.   PHYSICAL EXAM:   VS:  There were no vitals taken for this visit.   GEN: Well nourished, well developed, in no acute distress  HEENT: normal  Neck: no JVD, carotid bruits, or masses Cardiac: RRR; no murmurs, rubs, or gallops,no edema  Respiratory:  clear to auscultation bilaterally, normal work of breathing GI: soft, nontender, nondistended, + BS MS: no deformity or atrophy  Skin: warm and dry, no rash Neuro:  Alert and Oriented x 3, Strength and sensation are intact Psych: euthymic mood, full affect  Wt Readings from Last 3 Encounters:  04/06/21 217 lb (98.4 kg)  03/30/21 218 lb 12.8 oz (99.2 kg)  12/23/20 213 lb 3.2 oz (96.7 kg)      Studies/Labs Reviewed:   EKG:  EKG is not ordered today.    Recent Labs: 08/14/2020: Magnesium 1.7 08/16/2020: Hemoglobin 8.3; Platelets 102 08/19/2020: ALT 75; B Natriuretic Peptide 1,357.0 03/30/2021: BUN 27; Creatinine, Ser 1.98; Potassium 4.2;  Sodium 141   Lipid Panel  Component Value Date/Time   CHOL 112 10/17/2020 1533   CHOL 84 (L) 02/29/2020 0000   TRIG 78 10/17/2020 1533   HDL 33 (L) 10/17/2020 1533   HDL 25 (L) 02/29/2020 0000   CHOLHDL 3.4 10/17/2020 1533   VLDL 16 10/17/2020 1533   LDLCALC 63 10/17/2020 1533   LDLCALC 41 02/29/2020 0000   Labs dated 04/30/19: Hgb 9.6. BUN 24, creatinine 1.62. otherwise CMET normal. Glucose 80  Additional studies/ records that were reviewed today include:   Cath 10/12/2017 Ost LM lesion is 40% stenosed. Prox Cx to Dist Cx lesion is 75% stenosed. Ost 1st Mrg lesion is 60% stenosed. Previously placed Prox LAD to Mid LAD stent (unknown type) is widely patent. Mid LAD lesion is 100% stenosed. Ost 2nd Diag to 2nd Diag lesion is 70% stenosed. Prox RCA lesion is 30% stenosed. Mid RCA lesion is 40% stenosed. Post Atrio lesion is 95% stenosed.   1.  Continued patency of the stented segment in the proximal LAD and stented segment in the proximal RCA 2.  Progressive small vessel CAD involving the distal left circumflex and right posterolateral branch 3.  Chronic occlusion of the mid LAD beyond the second diagonal branch unchanged in appearance from the previous study 4.  Normal LVEDP   Recommendation: Ongoing aggressive medical therapy.  The patient's probable culprit lesion is a severe stenosis in the distal posterior lateral branch involving a small territory and small caliber vessel that I think would be best treated with medical therapy rather than intervention.   Recommend dual antiplatelet therapy with Aspirin 65m daily and Ticagrelor 985mtwice daily long-term (beyond 12 months) because of diffuse CAD/diabetes/multivessel stenting.  Echo 09/01/18: IMPRESSIONS      1. The left ventricle has moderate-severely reduced systolic function, with an ejection fraction of 30-35%. The cavity size was mildly dilated. Left ventricular diastolic Doppler parameters are consistent with  pseudonormalization.  2. The right ventricle has normal systolic function. The cavity was normal.  3. The mitral valve is grossly normal.  4. The aortic valve is tricuspid. Mild thickening of the aortic valve. No stenosis of the aortic valve.  5. Technically difficult; definity used; akinesis of the apex with overall moderate to severe LV dysfunction; moderate diastolic dysfunction.  Echo 01/28/20: IMPRESSIONS     1. Left ventricular ejection fraction, by estimation, is 40 to 45%. The  left ventricle has mildly decreased function. The left ventricle  demonstrates regional wall motion abnormalities with apical septal and  apical akinesis. There is mild left  ventricular hypertrophy. Left ventricular diastolic parameters are  consistent with Grade I diastolic dysfunction (impaired relaxation).   2. Right ventricular systolic function is normal. The right ventricular  size is normal. Tricuspid regurgitation signal is inadequate for assessing  PA pressure.   3. The mitral valve is normal in structure. No evidence of mitral valve  regurgitation. No evidence of mitral stenosis.   4. The aortic valve is tricuspid. Aortic valve regurgitation is not  visualized.   5. The inferior vena cava is normal in size with <50% respiratory  variability, suggesting right atrial pressure of 8 mmHg.   Echo 12/23/20: IMPRESSIONS     1. Left ventricular ejection fraction, by estimation, is 45%. The left  ventricle has mildly decreased function. The left ventricle demonstrates  regional wall motion abnormalities with apical anterior, apical inferior,  and true apex akinesis. The left  ventricular internal cavity size was mildly dilated. Left ventricular  diastolic parameters are consistent with Grade  II diastolic dysfunction  (pseudonormalization).   2. Right ventricular systolic function is normal. The right ventricular  size is normal. Tricuspid regurgitation signal is inadequate for assessing  PA  pressure.   3. The mitral valve is normal in structure. Trivial mitral valve  regurgitation. No evidence of mitral stenosis.   4. The aortic valve is tricuspid. Aortic valve regurgitation is not  visualized. No aortic stenosis is present.   5. The inferior vena cava is normal in size with greater than 50%  respiratory variability, suggesting right atrial pressure of 3 mmHg.    ASSESSMENT:    No diagnosis found.    PLAN:  In order of problems listed above:  Chronic combined systolic and diastolic heart failure: EF better at 40-45%.  On maximal Coreg due to history of bradycardia and presence of Mobitz 1 heart block during prior  hospitalization.  On Entresto maximal dose and aldactone. Unable to tolerate Bidil due to lightheadedness but is tolerating Imdur OK.  Intolerant of [place].   CAD: s/p DES of LAD in 2018 in setting of anterior MI. S/p DES of RCA in January 2019. I reviewed her last cardiac cath in July 2019. She has extensive small distal branch vessel disease in distal LAD (CTO), small diagonal, distal LCX, and 95% PL lesion. these are not amenable to PCI due to small vessel size. High risk Myoview in July 2019 with cardiac cath as noted. Medical management.  Class 1  angina. On DAPT, statin, Coreg and Imdur.   Hypertension: Blood pressure has been well controlled according to the patient.  Hyperlipidemia: On Lipitor 80 mg daily.  LDL 41.   DM2: managed by endocrinology. Currently on glipizide and insulin. Now on Trulicity. A1c down to 6.7%. recommend she take Psyllium daily for her bowels.    6.   CKD stage 3b  Follow up in 6 months.  Medication Adjustments/Labs and Tests Ordered: Current medicines are reviewed at length with the patient today.  Concerns regarding medicines are outlined above.  Medication changes, Labs and Tests ordered today are listed in the Patient Instructions below. There are no Patient Instructions on file for this visit.    Signed, Lacreasha Hinds [name],  MD  05/21/2021 8:15 AM    [facility] [address], [place], [place]  [address] Phone: [phone]; Fax: [phone]

## 2021-05-27 ENCOUNTER — Encounter: Payer: Self-pay | Admitting: Cardiology

## 2021-05-27 ENCOUNTER — Ambulatory Visit (INDEPENDENT_AMBULATORY_CARE_PROVIDER_SITE_OTHER): Payer: Medicare Other | Admitting: Cardiology

## 2021-05-27 ENCOUNTER — Other Ambulatory Visit: Payer: Self-pay

## 2021-05-27 VITALS — BP 120/60 | HR 74 | Ht 68.5 in | Wt 211.4 lb

## 2021-05-27 DIAGNOSIS — I25118 Atherosclerotic heart disease of native coronary artery with other forms of angina pectoris: Secondary | ICD-10-CM | POA: Diagnosis not present

## 2021-05-27 DIAGNOSIS — E1159 Type 2 diabetes mellitus with other circulatory complications: Secondary | ICD-10-CM

## 2021-05-27 DIAGNOSIS — I5042 Chronic combined systolic (congestive) and diastolic (congestive) heart failure: Secondary | ICD-10-CM

## 2021-05-27 DIAGNOSIS — N184 Chronic kidney disease, stage 4 (severe): Secondary | ICD-10-CM

## 2021-05-27 DIAGNOSIS — E785 Hyperlipidemia, unspecified: Secondary | ICD-10-CM | POA: Diagnosis not present

## 2021-05-28 LAB — LIPID PANEL
Chol/HDL Ratio: 2.5 ratio (ref 0.0–4.4)
Cholesterol, Total: 94 mg/dL — ABNORMAL LOW (ref 100–199)
HDL: 38 mg/dL — ABNORMAL LOW (ref >39)
LDL Chol Calc (NIH): 43 mg/dL (ref 0–99)
Triglycerides: 52 mg/dL (ref 0–149)
VLDL Cholesterol Cal: 13 mg/dL (ref 5–40)

## 2021-05-28 LAB — BASIC METABOLIC PANEL
BUN/Creatinine Ratio: 16 (ref 12–28)
BUN: 30 mg/dL — ABNORMAL HIGH (ref 8–27)
CO2: 23 mmol/L (ref 20–29)
Calcium: 9.2 mg/dL (ref 8.7–10.3)
Chloride: 105 mmol/L (ref 96–106)
Creatinine, Ser: 1.89 mg/dL — ABNORMAL HIGH (ref 0.57–1.00)
Glucose: 184 mg/dL — ABNORMAL HIGH (ref 70–99)
Potassium: 4.4 mmol/L (ref 3.5–5.2)
Sodium: 139 mmol/L (ref 134–144)
eGFR: 30 mL/min/{1.73_m2} — ABNORMAL LOW (ref >59)

## 2021-05-28 LAB — HEPATIC FUNCTION PANEL
ALT: 15 IU/L (ref 0–32)
AST: 16 IU/L (ref 0–40)
Albumin: 4.3 g/dL (ref 3.8–4.8)
Alkaline Phosphatase: 86 IU/L (ref 44–121)
Bilirubin Total: 0.3 mg/dL (ref 0.0–1.2)
Bilirubin, Direct: <0.1 mg/dL (ref 0.00–0.40)
Total Protein: 7.2 g/dL (ref 6.0–8.5)

## 2021-05-28 LAB — HEMOGLOBIN A1C
Est. average glucose Bld gHb Est-mCnc: 183 mg/dL
Hgb A1c MFr Bld: 8 % — ABNORMAL HIGH (ref 4.8–5.6)

## 2021-06-01 ENCOUNTER — Telehealth: Payer: Self-pay

## 2021-06-01 NOTE — Telephone Encounter (Signed)
? ?  Pre-operative Risk Assessment  ?  ?Patient Name: Stephanie Manning  ?DOB: 1960/05/11 ?MRN: [id number]  ? ? ? ?Request for Surgical Clearance   ? ?Procedure:  Dental Extraction - Amount of Teeth to be Pulled:  1 ? ?Date of Surgery:  Clearance TBD                              ?   ?Surgeon:    ?Surgeon's Group or Practice Name:  [facility] ?Phone number:  [phone] ?Fax number:  [phone] ?  ?Type of Clearance Requested:   ?- Medical  ?- Pharmacy:  Hold Ticagrelor (Brilinta) (They do not require pt to hold) ?  ?Type of Anesthesia:   Versed ?  ?Additional requests/questions:   Is Antibiotic Prophylaxis required? ? ?Signed, ?Arlissa Monteverde   ?06/01/2021, 5:14 PM  ? ?

## 2021-06-02 NOTE — Telephone Encounter (Signed)
? ?  Patient Name: Stephanie Manning  ?DOB: 1960-05-10 ?MRN: [id number] ? ?Primary Cardiologist: Peter [name], MD ? ?Chart reviewed as part of pre-operative protocol coverage.  ? ?Dental extractions of one or two teeth are considered low risk procedures per guidelines and generally do not require any specific cardiac clearance. It is also generally accepted that for extractions of one or two teeth and dental cleanings, there is no need to interrupt blood thinner therapy. ? ?SBE prophylaxis is not required for the patient from a cardiac standpoint. ? ?I will route this recommendation to the requesting party via Epic fax function and remove from pre-op pool. ? ?Please call with questions. ? ?Ledora Bottcher, PA ?06/02/2021, 6:55 AM ? ?

## 2021-06-29 ENCOUNTER — Ambulatory Visit (HOSPITAL_COMMUNITY)
Admission: RE | Admit: 2021-06-29 | Discharge: 2021-06-29 | Disposition: A | Discharge status: Home / Self Care | Payer: Medicare Other | Source: Ambulatory Visit | Attending: Cardiology | Admitting: Cardiology

## 2021-06-29 ENCOUNTER — Encounter (HOSPITAL_COMMUNITY): Payer: Self-pay | Admitting: Cardiology

## 2021-06-29 VITALS — BP 148/70 | HR 86 | Wt 210.8 lb

## 2021-06-29 DIAGNOSIS — Z7982 Long term (current) use of aspirin: Secondary | ICD-10-CM | POA: Insufficient documentation

## 2021-06-29 DIAGNOSIS — E1122 Type 2 diabetes mellitus with diabetic chronic kidney disease: Secondary | ICD-10-CM | POA: Insufficient documentation

## 2021-06-29 DIAGNOSIS — I255 Ischemic cardiomyopathy: Secondary | ICD-10-CM | POA: Insufficient documentation

## 2021-06-29 DIAGNOSIS — I951 Orthostatic hypotension: Secondary | ICD-10-CM | POA: Diagnosis not present

## 2021-06-29 DIAGNOSIS — I130 Hypertensive heart and chronic kidney disease with heart failure and stage 1 through stage 4 chronic kidney disease, or unspecified chronic kidney disease: Secondary | ICD-10-CM | POA: Diagnosis not present

## 2021-06-29 DIAGNOSIS — I5022 Chronic systolic (congestive) heart failure: Secondary | ICD-10-CM | POA: Insufficient documentation

## 2021-06-29 DIAGNOSIS — N183 Chronic kidney disease, stage 3 unspecified: Secondary | ICD-10-CM | POA: Insufficient documentation

## 2021-06-29 DIAGNOSIS — Z7902 Long term (current) use of antithrombotics/antiplatelets: Secondary | ICD-10-CM | POA: Insufficient documentation

## 2021-06-29 DIAGNOSIS — I252 Old myocardial infarction: Secondary | ICD-10-CM | POA: Insufficient documentation

## 2021-06-29 DIAGNOSIS — I5042 Chronic combined systolic (congestive) and diastolic (congestive) heart failure: Secondary | ICD-10-CM

## 2021-06-29 DIAGNOSIS — D509 Iron deficiency anemia, unspecified: Secondary | ICD-10-CM | POA: Diagnosis not present

## 2021-06-29 DIAGNOSIS — Z79899 Other long term (current) drug therapy: Secondary | ICD-10-CM | POA: Diagnosis not present

## 2021-06-29 DIAGNOSIS — I25110 Atherosclerotic heart disease of native coronary artery with unstable angina pectoris: Secondary | ICD-10-CM | POA: Diagnosis present

## 2021-06-29 LAB — BASIC METABOLIC PANEL
Anion gap: 7 (ref 5–15)
BUN: 23 mg/dL (ref 8–23)
CO2: 24 mmol/L (ref 22–32)
Calcium: 9.2 mg/dL (ref 8.9–10.3)
Chloride: 109 mmol/L (ref 98–111)
Creatinine, Ser: 1.75 mg/dL — ABNORMAL HIGH (ref 0.44–1.00)
GFR, Estimated: 33 mL/min — ABNORMAL LOW (ref >60)
Glucose, Bld: 178 mg/dL — ABNORMAL HIGH (ref 70–99)
Potassium: 4.2 mmol/L (ref 3.5–5.1)
Sodium: 140 mmol/L (ref 135–145)

## 2021-06-29 LAB — BRAIN NATRIURETIC PEPTIDE: B Natriuretic Peptide: 252.6 pg/mL — ABNORMAL HIGH (ref 0.0–100.0)

## 2021-06-29 NOTE — Patient Instructions (Signed)
There has been no changes to your medications.  Labs done today, your results will be available in MyChart, we will contact you for abnormal readings.  Your physician recommends that you schedule a follow-up appointment in: 4 months   If you have any questions or concerns before your next appointment please send us a message through mychart or call our office at [phone].    TO LEAVE A MESSAGE FOR THE NURSE SELECT OPTION 2, PLEASE LEAVE A MESSAGE INCLUDING: YOUR NAME DATE OF BIRTH CALL BACK NUMBER REASON FOR CALL**this is important as we prioritize the call backs  YOU WILL RECEIVE A CALL BACK THE SAME DAY AS LONG AS YOU CALL BEFORE 4:00 PM  At the [facility], you and your health needs are our priority. As part of our continuing mission to provide you with exceptional heart care, we have created designated Provider Care Teams. These Care Teams include your primary Cardiologist (physician) and Advanced Practice Providers (APPs- Physician Assistants and Nurse Practitioners) who all work together to provide you with the care you need, when you need it.   You may see any of the following providers on your designated Care Team at your next follow up: Dr Daniel Bensimhon Dr Dalton McLean Amy Clegg, NP Brittainy Simmons, PA Jessica Milford,NP Lindsay Finch, PA Lauren Kemp, PharmD   Please be sure to bring in all your medications bottles to every appointment.    

## 2021-06-30 NOTE — Progress Notes (Signed)
?  ? ? ?Date:  06/30/2021  ? ?ID:  Stephanie Manning, DOB Sep 02, 1960, MRN [id number]  ?Provider location: [facility] ?Type of Visit: Established patient ? ?PCP:  [name], MD  ?HF Cardiology: Dr. [name] ?  ?History of Present Illness: ?Stephanie Manning is a 61 y.o. female who has a history of CAD and ischemic cardiomyopathy.  In 12/18, she had anterior MI with DES to totally occluded mid LAD (culprit). She returned in 1/19 for staged DES to 80% RCA stenosis. Echo in 12/18 showed EF 30-35%.  Repeat echo in 3/19 showed EF stable at 30-35%.   ?  ?She was admitted in 7/19 with chest pain, concern for unstable angina.  Culprit of symptoms was thought to be 95% stenosis in a small PLV vessel, medical treatment planned.  ?  ?She saw Dr. Rayann Heman and decided against ICD.  ? ?Echo was done in 6/20 showing stable EF 30-35%.  ?  ?She was unable to tolerate Bidil 1/2 tablet tid due to lightheadedness. She was unable to tolerate dapagliflozin.  ? ?Echo in 11/21 showed EF 40-45% with apical septal and apical akinesis, normal RV.  ? ?Patient was admitted in 6/22 with poor po intake, feeling "sick."  She was noted to be dehydrated with hypotension, orthostasis, and AKI.  She was hydrated and Entresto and spironolactone were held.  Creatinine was as high as 3.28.   She got IV fluid and developed volume overload requiring Lasix.  ? ?Echo in 10/22 showed EF 45% with akinetic apex, normal RV.  ? ?She returns for followup of CHF and CAD.  Weight down 8 more lbs.  BP high today, but SBP runs 120s-130s at home (checks regularly).  Rare atypical chest pain.  Doing yardwork without dyspnea.  Mild dyspnea if she walks a long distance or goes up a steep hill.  No orthopnea/PND.  No palpitations.  No lightheadedness.  ? ?ECG (personally reviewed): NSR/sinus arrhythmia, lateral TWIs.  ?  ?Labs (2/19): K 4.7, creatinine 1.48, hgb 8.4 ?Labs (5/19): LDL 65, K 4.7, creatinine 1.66 ?Labs (8/19): K 5, creatinine 1.78   ?Labs (9/19): K 3.8, creatinine 1.6 ?Labs (12/19): K 3.9, creatinine 1.59 ?Labs (3/20): LDL 73, HDL 41 ?Labs (5/20): K 4.1, creatinine 1.59, LDL 73 ?Labs (9/20): K 4.2, creatinine 1.6 ?Labs (11/20): K 4.1, creatinine 1.7, LDL 50, HDL 32 ?Labs (3/21): K 4.1, creatinine 1.67 ?Labs (8/21): LDL 76, HDL 30, K 4, creatinine 1.86 ?Labs (12/21): LDL 41, HDL 25, TSH normal, K 4, creatinine 1.74 ?Labs (6/22): creatinine 3.28 => 1.96 => 1.94, K 3.8, AST 76, ALT 65 ?Labs (8/22): LDL 63, K 4.1, creatinine 1.69 ?Labs (3/23): LDL 43, K 4.4, creatinine 1.89 ?  ?PMH: ?1. Type II diabetes ?2. CKD stage 3.  ?3. Hyperlipidemia ?4. HTN ?5. Fe deficiency anemia ?6. GERD ?7. CAD: Anterior MI in 12/18 with DES to 100% stenosed mid LAD (culprit), also with CTO of distal LAD, 80% pRCA, 85% mLCx/90% dLCx, 90% OM3.  ?- 1/19 staged PCI of proximal RCA.  ?- Admitted with unstable angina 7/19.  LHC: 75% stenosis proximal-mid LCx, 60% OM1, total occlusion of the distal LAD, patent mid LAD stent, patent RCA stents, 95% stenosis in small PLV branch (likely culprit).  ?8. Chronic systolic CHF: Ischemic cardiomyopathy.  ?- Echo (12/18): EF 30-35%.  ?- Echo (3/19): EF 30-35%, WMAs in LAD territory, RV mildly dilated.  ?- Echo (6/20): EF 30-35%, mild LV dilation, normal RV size and systolic function.  ?-  Echo (11/21): EF 40-45% with apical septal and apical akinesis, normal RV. ?- Echo (10/22): EF 45% with akinetic apex, normal RV.  ? ?Current Outpatient Medications  ?Medication Sig Dispense Refill  ? ACCU-CHEK GUIDE test strip Check blood sugar 2 times a day before breakfast and every night before bedtime. 200 each 3  ? Accu-Chek Softclix Lancets lancets Use as instructed 100 each 2  ? aspirin 81 MG chewable tablet Chew 1 tablet (81 mg total) by mouth daily.    ? atorvastatin (LIPITOR) 80 MG tablet Take 80 mg by mouth daily.    ? blood glucose meter kit and supplies Dispense based on patient and insurance preference. Use up to four times daily as  directed. (FOR ICD-10 E11.65) Pt prefers One touch mini 1 each 5  ? carvedilol (COREG) 12.5 MG tablet Take 1 tablet (12.5 mg total) by mouth 2 (two) times daily with a meal. 180 tablet 3  ? Dulaglutide (TRULICITY) 1.5 WR/6.0AV SOPN Inject 1.5 mg into the skin every 14 (fourteen) days. 3 mL 3  ? ELDERBERRY PO Take 2 tablets by mouth every other day.     ? ferrous sulfate 300 (60 Fe) MG/5ML syrup Take 300 mg by mouth daily with breakfast.    ? furosemide (LASIX) 20 MG tablet Take 1 tablet (20 mg total) by mouth daily as needed. 90 tablet 3  ? gabapentin (NEURONTIN) 300 MG capsule Take 300 mg by mouth at bedtime.     ? glipiZIDE (GLUCOTROL) 5 MG tablet Take by mouth daily before breakfast. As needed    ? insulin aspart (NOVOLOG FLEXPEN) 100 UNIT/ML FlexPen Inject 5 Units into the skin daily as needed for high blood sugar.    ? insulin detemir (LEVEMIR) 100 UNIT/ML injection Inject 40 Units into the skin daily.    ? Multiple Vitamin (MULTIVITAMIN WITH MINERALS) TABS tablet Take 1 tablet by mouth daily.    ? nitroGLYCERIN (NITROSTAT) 0.4 MG SL tablet Dissolve 1 tab under tongue for chest pain- if pain remains after 5 min, call 911, maxof 3 tabs in 15 minutes 25 tablet 2  ? NONFORMULARY OR COMPOUNDED ITEM Seamoss once daily    ? pantoprazole (PROTONIX) 40 MG tablet Take 1 tablet (40 mg total) by mouth 2 (two) times daily. 180 tablet 3  ? sacubitril-valsartan (ENTRESTO) 49-51 MG Take 1 tablet by mouth 2 (two) times daily. 60 tablet 4  ? Saline 0.2 % SOLN Place 1 spray into both nostrils as needed (congestion).     ? spironolactone (ALDACTONE) 25 MG tablet Take 1 tablet (25 mg total) by mouth daily. 90 tablet 3  ? ticagrelor (BRILINTA) 60 MG TABS tablet Take 1 tablet (60 mg total) by mouth 2 (two) times daily. 180 tablet 3  ? tiZANidine (ZANAFLEX) 4 MG tablet Take 4 mg by mouth at bedtime.    ? ?No current facility-administered medications for this encounter.  ? ? ?Allergies:   Hydralazine, Bidil [isosorb  dinitrate-hydralazine], Ciprofloxacin, Farxiga [dapagliflozin], Hydralazine hcl, Isosorbide dinitrate, Ketorolac, Nsaids, Propoxycaine, Amoxicillin, Hydrocodone, Lisinopril, Meloxicam, Strawberry extract, Tape, and Toradol [ketorolac tromethamine]  ? ?Social History:  The patient  reports that she has never smoked. She has never used smokeless tobacco. She reports that she does not drink alcohol and does not use drugs.  ? ?Family History:  The patient's family history includes Diabetes in her mother; Heart disease in her mother; Hypertension in her mother; Stroke in her mother.  ? ?ROS:  Please see the history of present illness.  All other systems are personally reviewed and negative.  ? ?Exam:   ?BP (!) 148/70   Pulse 86   Wt 95.6 kg (210 lb 12.8 oz)   SpO2 100%   BMI 31.59 kg/m?  ?General: NAD ?Neck: No JVD, no thyromegaly or thyroid nodule.  ?Lungs: Clear to auscultation bilaterally with normal respiratory effort. ?CV: Nondisplaced PMI.  Heart regular S1/S2, no S3/S4, no murmur.  No peripheral edema.  No carotid bruit.  Normal pedal pulses.  ?Abdomen: Soft, nontender, no hepatosplenomegaly, no distention.  ?Skin: Intact without lesions or rashes.  ?Neurologic: Alert and oriented x 3.  ?Psych: Normal affect. ?Extremities: No clubbing or cyanosis.  ?HEENT: Normal.  ? ?Recent Labs: ?08/14/2020: Magnesium 1.7 ?08/16/2020: Hemoglobin 8.3; Platelets 102 ?05/27/2021: ALT 15 ?06/29/2021: B Natriuretic Peptide 252.6; BUN 23; Creatinine, Ser 1.75; Potassium 4.2; Sodium 140  ?Personally reviewed  ? ?Wt Readings from Last 3 Encounters:  ?06/29/21 95.6 kg (210 lb 12.8 oz)  ?05/27/21 95.9 kg (211 lb 6.4 oz)  ?04/06/21 98.4 kg (217 lb)  ?  ?ASSESSMENT AND PLAN: ? ?1. CAD: Anterior MI in 12/18 with DES to proximal LAD (culprit) followed by staged DES to proximal RCA.  7/19 admission with unstable angina, likely culprit was 95% stenosis in small PLV. This was managed conservatively.  Rare atypical chest pain.      ?- Continue  atorvastatin, good lipids 3/23.  ?- Continue ASA 81 daily and ticagrelor 60 mg bid.  ?2. Chronic systolic CHF: Ischemic cardiomyopathy. Echo in 10/22 showed EF 45%.  She is not volume overloaded on exam, NYHA class II symptoms. B

## 2021-07-13 HISTORY — PX: TOOTH EXTRACTION: SUR596

## 2021-07-20 ENCOUNTER — Other Ambulatory Visit: Payer: Self-pay

## 2021-07-20 ENCOUNTER — Telehealth: Payer: Self-pay | Admitting: Cardiology

## 2021-07-20 MED ORDER — CARVEDILOL 12.5 MG PO TABS: 12.5000 mg | ORAL_TABLET | Freq: Two times a day (BID) | ORAL | 3 refills | Status: DC

## 2021-07-20 MED ORDER — TICAGRELOR 60 MG PO TABS: 60.0000 mg | ORAL_TABLET | Freq: Two times a day (BID) | ORAL | 3 refills | Status: DC

## 2021-07-20 NOTE — Telephone Encounter (Signed)
Medication refilled and sent to the desired pharmacy ?

## 2021-07-20 NOTE — Telephone Encounter (Signed)
?*  STAT* If patient is at the pharmacy, call can be transferred to refill team. ? ? ?1. Which medications need to be refilled? (please list name of each medication and dose if known) ticagrelor (BRILINTA) 60 MG TABS tablet ?carvedilol (COREG) 12.5 MG tablet ? ?2. Which pharmacy/location (including street and city if local pharmacy) is medication to be sent to? [place], [place] - [address], [address] ? ?3. Do they need a 30 day or 90 day supply? 90 day ? ?Patient is out of medication ?  ?

## 2021-07-25 IMAGING — DX DG CHEST 1V PORT
1 series · 1 of 1 positions shown · non-contrast
Comparison: August 12, 2020

CLINICAL DATA: Hypotension and dizziness

EXAM:
PORTABLE CHEST 1 VIEW

[chest ap]
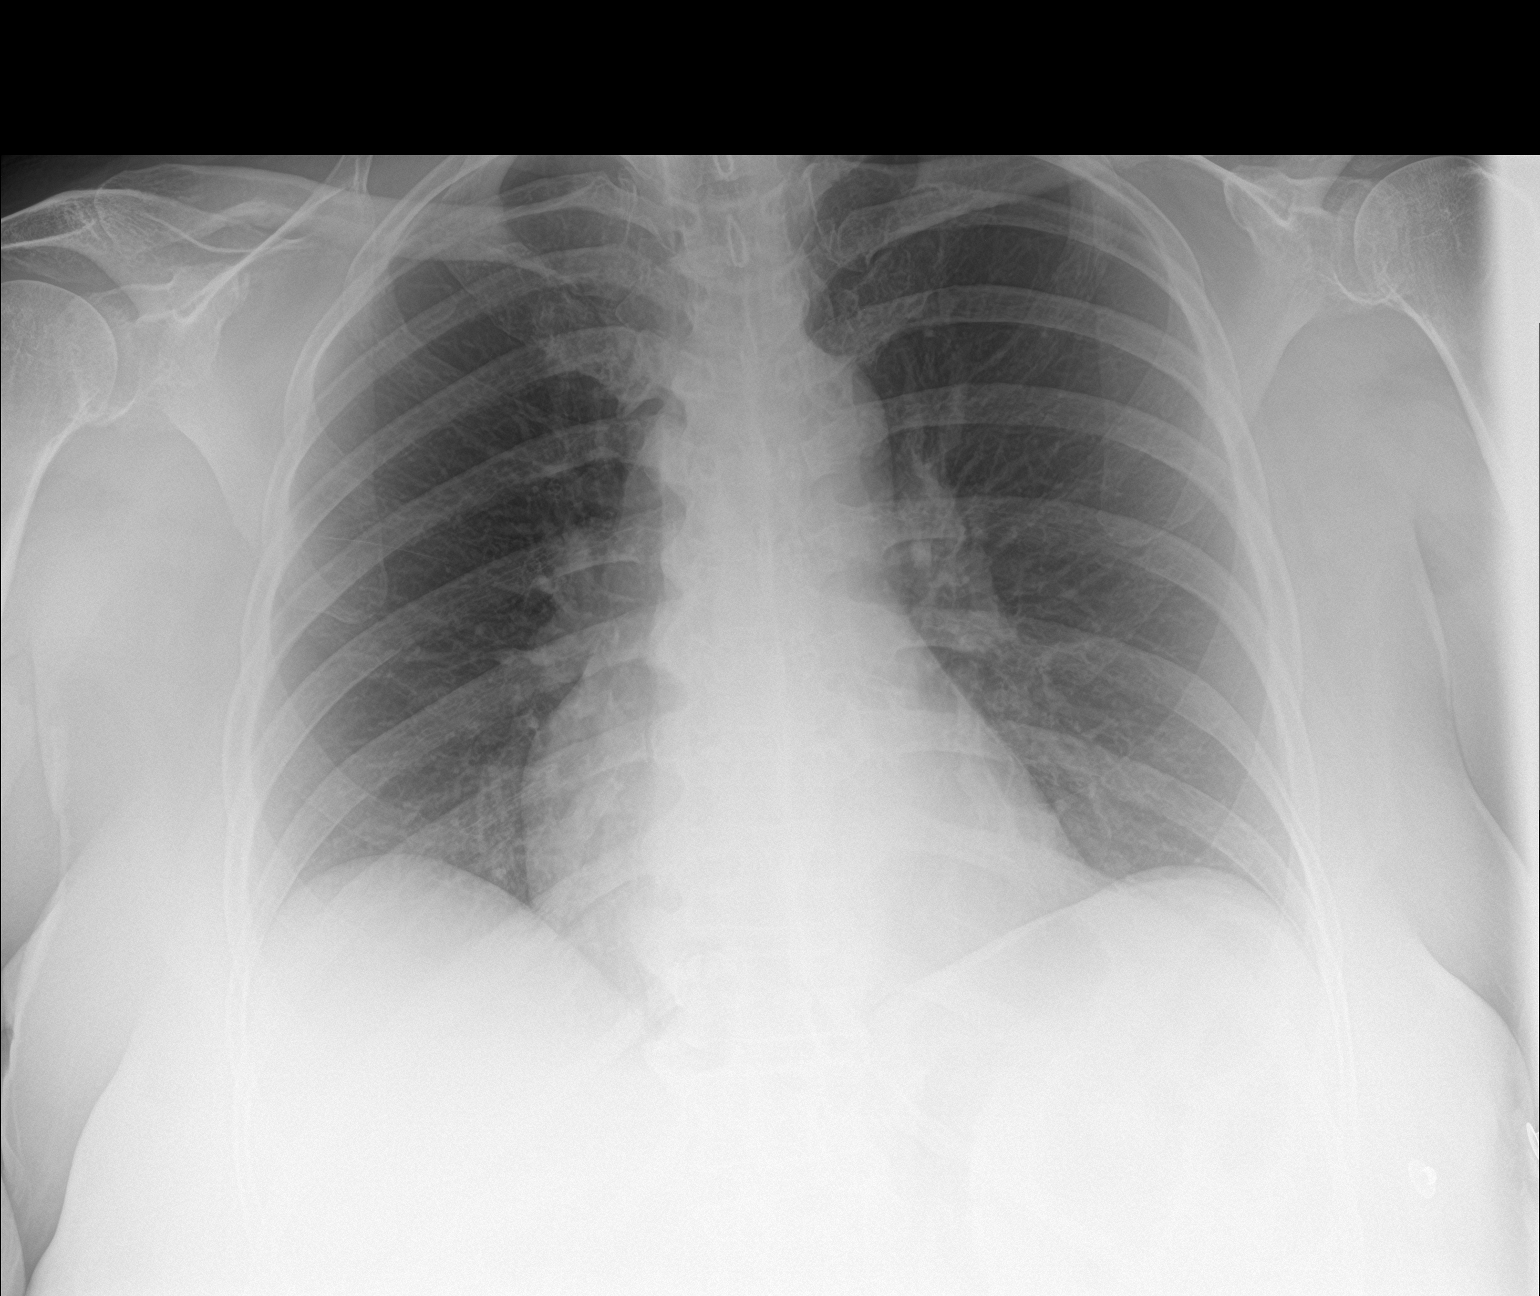

[1 of 1 positions shown; findings below may reference images not displayed]

FINDINGS: The lungs are clear. The heart size and pulmonary vascularity are
normal. No adenopathy. No bone lesions.
IMPRESSION: Lungs clear.  Cardiac silhouette normal.

## 2021-08-04 ENCOUNTER — Ambulatory Visit (INDEPENDENT_AMBULATORY_CARE_PROVIDER_SITE_OTHER): Payer: Medicare Other | Admitting: Nurse Practitioner

## 2021-08-04 ENCOUNTER — Encounter: Payer: Self-pay | Admitting: Nurse Practitioner

## 2021-08-04 VITALS — BP 130/68 | HR 72 | Ht 68.5 in | Wt 209.4 lb

## 2021-08-04 DIAGNOSIS — I10 Essential (primary) hypertension: Secondary | ICD-10-CM | POA: Diagnosis not present

## 2021-08-04 DIAGNOSIS — E782 Mixed hyperlipidemia: Secondary | ICD-10-CM

## 2021-08-04 DIAGNOSIS — N1832 Chronic kidney disease, stage 3b: Secondary | ICD-10-CM | POA: Diagnosis not present

## 2021-08-04 DIAGNOSIS — E1122 Type 2 diabetes mellitus with diabetic chronic kidney disease: Secondary | ICD-10-CM | POA: Diagnosis not present

## 2021-08-04 DIAGNOSIS — Z794 Long term (current) use of insulin: Secondary | ICD-10-CM

## 2021-08-04 NOTE — Patient Instructions (Signed)
Diabetes Mellitus and Foot Care Foot care is an important part of your health, especially when you have diabetes. Diabetes may cause you to have problems because of poor blood flow (circulation) to your feet and legs, which can cause your skin to: Become thinner and drier. Break more easily. Heal more slowly. Peel and crack. You may also have nerve damage (neuropathy) in your legs and feet, causing decreased feeling in them. This means that you may not notice minor injuries to your feet that could lead to more serious problems. Noticing and addressing any potential problems early is the best way to prevent future foot problems. How to care for your feet Foot hygiene  Wash your feet daily with warm water and mild soap. Do not use hot water. Then, pat your feet and the areas between your toes until they are completely dry. Do not soak your feet as this can dry your skin. Trim your toenails straight across. Do not dig under them or around the cuticle. File the edges of your nails with an emery board or nail file. Apply a moisturizing lotion or petroleum jelly to the skin on your feet and to dry, brittle toenails. Use lotion that does not contain alcohol and is unscented. Do not apply lotion between your toes. Shoes and socks Wear clean socks or stockings every day. Make sure they are not too tight. Do not wear knee-high stockings since they may decrease blood flow to your legs. Wear shoes that fit properly and have enough cushioning. Always look in your shoes before you put them on to be sure there are no objects inside. To break in new shoes, wear them for just a few hours a day. This prevents injuries on your feet. Wounds, scrapes, corns, and calluses  Check your feet daily for blisters, cuts, bruises, sores, and redness. If you cannot see the bottom of your feet, use a mirror or ask someone for help. Do not cut corns or calluses or try to remove them with medicine. If you find a minor scrape,  cut, or break in the skin on your feet, keep it and the skin around it clean and dry. You may clean these areas with mild soap and water. Do not clean the area with peroxide, alcohol, or iodine. If you have a wound, scrape, corn, or callus on your foot, look at it several times a day to make sure it is healing and not infected. Check for: Redness, swelling, or pain. Fluid or blood. Warmth. Pus or a bad smell. General tips Do not cross your legs. This may decrease blood flow to your feet. Do not use heating pads or hot water bottles on your feet. They may burn your skin. If you have lost feeling in your feet or legs, you may not know this is happening until it is too late. Protect your feet from hot and cold by wearing shoes, such as at the beach or on hot pavement. Schedule a complete foot exam at least once a year (annually) or more often if you have foot problems. Report any cuts, sores, or bruises to your health care provider immediately. Where to find more information American Diabetes Association: www.diabetes.org Association of Diabetes [employer] & Education Specialists: www.diabeteseducator.org Contact a health care provider if: You have a medical condition that increases your risk of infection and you have any cuts, sores, or bruises on your feet. You have an injury that is not healing. You have redness on your legs or feet. You   feel burning or tingling in your legs or feet. You have pain or cramps in your legs and feet. Your legs or feet are numb. Your feet always feel cold. You have pain around any toenails. Get help right away if: You have a wound, scrape, corn, or callus on your foot and: You have pain, swelling, or redness that gets worse. You have fluid or blood coming from the wound, scrape, corn, or callus. Your wound, scrape, corn, or callus feels warm to the touch. You have pus or a bad smell coming from the wound, scrape, corn, or callus. You have a fever. You have a red  line going up your leg. Summary Check your feet every day for blisters, cuts, bruises, sores, and redness. Apply a moisturizing lotion or petroleum jelly to the skin on your feet and to dry, brittle toenails. Wear shoes that fit properly and have enough cushioning. If you have foot problems, report any cuts, sores, or bruises to your health care provider immediately. Schedule a complete foot exam at least once a year (annually) or more often if you have foot problems. This information is not intended to replace advice given to you by your health care provider. Make sure you discuss any questions you have with your health care provider. Document Revised: 09/20/2019 Document Reviewed: 09/20/2019 [employer] Patient Education  2023 [employer]  

## 2021-08-04 NOTE — Progress Notes (Signed)
08/04/2021, 11:11 AM  Endocrinology Follow Up Visit   Subjective:    Patient ID: Stephanie Manning, female    DOB: [birth year].  Wrightwood Carmack is being seen in follow-up  for management of currently uncontrolled symptomatic diabetes requested by  [name], MD.   Past Medical History:  Diagnosis Date   AKI (acute kidney injury) ([place]) 02/2017   CAD in native artery    a. NSTEMI 02/2017 s/p DES to mLAD, diffuse disease otherwise, EF 30-35%. b. Staged cath 03/2017 - patent stent in the proximal to mid LAD. CTO of the distal LAD, 90% diffuse small first diagonal, 90% distal LCx/OM, 80% diffuse prox RCA, moderately elevated LVEDP 54mHg, s/p DES to mRCA, residual disease treated medically (too small for PCI), EF 30-35%.   Chronic combined systolic and diastolic CHF (congestive heart failure) ([facility]    CKD (chronic kidney disease), stage IV ([facility]    /[name]2/09/2017   Diabetes mellitus, type II, insulin dependent (HCC)    GERD (gastroesophageal reflux disease)    History of blood transfusion    "related to menses"   History of stomach ulcers 2017   Hyperlipidemia    Hypertension    Iron deficiency anemia 03/2017   "had to have iron infusions" (10/10/2017)   Ischemic cardiomyopathy    STEMI (ST elevation myocardial infarction) ([facility] 02/2017    Past Surgical History:  Procedure Laterality Date   CESAREAN SECTION  1987   CORONARY ANGIOPLASTY WITH STENT PLACEMENT  04/01/2017   drug-eluting stent was successfully placed using a STENT SYNERGY DES [id number]   CORONARY STENT INTERVENTION N/A 04/01/2017   Procedure: CORONARY STENT INTERVENTION;  Surgeon: JMartinique [name], MD;  [facility];  Service: Cardiovascular;  Laterality: N/A;   CORONARY/GRAFT ACUTE MI REVASCULARIZATION N/A 03/10/2017   Procedure: Coronary/Graft Acute MI Revascularization;  Surgeon: JMartinique [name], MD;   [facility];  Service: Cardiovascular;  Laterality: N/A;   LEFT HEART CATH AND CORONARY ANGIOGRAPHY N/A 03/10/2017   Procedure: LEFT HEART CATH AND CORONARY ANGIOGRAPHY;  Surgeon: JMartinique [name], MD;  [facility];  Service: Cardiovascular;  Laterality: N/A;   LEFT HEART CATH AND CORONARY ANGIOGRAPHY N/A 04/01/2017   Procedure: LEFT HEART CATH AND CORONARY ANGIOGRAPHY;  Surgeon: JMartinique [name], MD;  [facility];  Service: Cardiovascular;  Laterality: N/A;   LEFT HEART CATH AND CORONARY ANGIOGRAPHY N/A 10/12/2017   Procedure: LEFT HEART CATH AND CORONARY ANGIOGRAPHY;  Surgeon: CSherren Mocha MD;  [facility];  Service: Cardiovascular;  Laterality: N/A;   OOPHORECTOMY     "1 ovary removed"   THROAT SURGERY  02/2014   "nodule in my throat removed; not related to thyroid"   TOOTH EXTRACTION  07/2021    Social History   Socioeconomic History   Marital status: Widowed    Spouse name: Not on file   Number of children: 2   Years of education: Not on file   Highest education level: Not on file  Occupational History   Not on file  Tobacco Use  Smoking status: Never   Smokeless tobacco: Never  Vaping Use   Vaping Use: Never used  Substance and Sexual Activity   Alcohol use: Never   Drug use: Never   Sexual activity: Yes  Other Topics Concern   Not on file  Social History Narrative   Not on file   Social Determinants of Health   Financial Resource Strain: Not on file  Food Insecurity: Not on file  Transportation Needs: Not on file  Physical Activity: Not on file  Stress: Not on file  Social Connections: Not on file    Family History  Problem Relation Age of Onset   Hypertension Mother    Heart disease Mother    Diabetes Mother    Stroke Mother     Outpatient Encounter Medications as of 08/04/2021  Medication Sig   ACCU-CHEK GUIDE test strip Check blood sugar 2 times a day before breakfast and every  night before bedtime.   Accu-Chek Softclix Lancets lancets Use as instructed   aspirin 81 MG chewable tablet Chew 1 tablet (81 mg total) by mouth daily.   atorvastatin (LIPITOR) 80 MG tablet Take 80 mg by mouth daily.   blood glucose meter kit and supplies Dispense based on patient and insurance preference. Use up to four times daily as directed. (FOR ICD-10 E11.65) Pt prefers One touch mini   carvedilol (COREG) 12.5 MG tablet Take 1 tablet (12.5 mg total) by mouth 2 (two) times daily with a meal.   Dulaglutide (TRULICITY) 1.5 GY/1.8HU SOPN Inject 1.5 mg into the skin every 14 (fourteen) days. (Patient taking differently: Inject 1.5 mg into the skin once a week.)   ELDERBERRY PO Take 2 tablets by mouth every other day.    ferrous sulfate 300 (60 Fe) MG/5ML syrup Take 300 mg by mouth daily with breakfast.   furosemide (LASIX) 20 MG tablet Take 1 tablet (20 mg total) by mouth daily as needed.   gabapentin (NEURONTIN) 300 MG capsule Take 300 mg by mouth at bedtime.    glipiZIDE (GLUCOTROL) 5 MG tablet Take by mouth daily before breakfast. As needed (Patient not taking: Reported on 08/04/2021)   insulin aspart (NOVOLOG FLEXPEN) 100 UNIT/ML FlexPen Inject 5 Units into the skin daily as needed for high blood sugar.   insulin detemir (LEVEMIR) 100 UNIT/ML injection Inject 15-40 Units into the skin daily.   Multiple Vitamin (MULTIVITAMIN WITH MINERALS) TABS tablet Take 1 tablet by mouth daily.   nitroGLYCERIN (NITROSTAT) 0.4 MG SL tablet Dissolve 1 tab under tongue for chest pain- if pain remains after 5 min, call 911, maxof 3 tabs in 15 minutes   NONFORMULARY OR COMPOUNDED ITEM Seamoss once daily   pantoprazole (PROTONIX) 40 MG tablet Take 1 tablet (40 mg total) by mouth 2 (two) times daily.   sacubitril-valsartan (ENTRESTO) 49-51 MG Take 1 tablet by mouth 2 (two) times daily.   Saline 0.2 % SOLN Place 1 spray into both nostrils as needed (congestion).    spironolactone (ALDACTONE) 25 MG tablet Take 1  tablet (25 mg total) by mouth daily.   ticagrelor (BRILINTA) 60 MG TABS tablet Take 1 tablet (60 mg total) by mouth 2 (two) times daily.   tiZANidine (ZANAFLEX) 4 MG tablet Take 4 mg by mouth at bedtime.   No facility-administered encounter medications on file as of 08/04/2021.    ALLERGIES: Allergies  Allergen Reactions   Hydralazine Other (See Comments)    Syncope due to hypotension   Bidil [Isosorb Dinitrate-Hydralazine]    Ciprofloxacin  Wilder Glade [Dapagliflozin]    Hydralazine Hcl    Isosorbide Dinitrate    Ketorolac    Nsaids    Propoxycaine    Amoxicillin Other (See Comments)    Yeast infection during treatment   Hydrocodone Palpitations, Rash and Other (See Comments)    GI upset, also   Lisinopril Palpitations and Other (See Comments)    Chest pain, too   Meloxicam Nausea Only   Strawberry Extract Hives    Does not always happen   Tape Rash    Some tapes cause rashes   Toradol [Ketorolac Tromethamine] Palpitations    VACCINATION STATUS:  There is no immunization history on file for this patient.  Diabetes She presents for her follow-up diabetic visit. She has type 2 diabetes mellitus. Onset time: She was diagnosed at approximate age of 63 years. Her disease course has been improving. There are no hypoglycemic associated symptoms. Pertinent negatives for hypoglycemia include no confusion, headaches, pallor or seizures. Pertinent negatives for diabetes include no chest pain, no fatigue, no polydipsia, no polyphagia and no polyuria. There are no hypoglycemic complications. Symptoms are stable. Diabetic complications include heart disease, nephropathy, peripheral neuropathy and retinopathy. Risk factors for coronary artery disease include diabetes mellitus, dyslipidemia, hypertension, obesity, sedentary lifestyle and post-menopausal. Current diabetic treatment includes insulin injections and oral agent (monotherapy) (and Trulicity every other week). She is compliant with  treatment most of the time. Her weight is decreasing steadily. She is following a generally healthy diet. When asked about meal planning, she reported none. She has not had a previous visit with a dietitian. She never participates in exercise. Her home blood glucose trend is increasing steadily. Her overall blood glucose range is >200 mg/dl. (She presents today with her meter, no logs, showing above target fasting and postprandial glycemic profile.  Her POCT A1c today is 9.6% increasing from last visit of 6.8%.  She does admit she over indulged in foods over the holiday season and she also reports she has not been taking her Trulicity on schedule due to pharmacy availability.  She admits she sometimes forgets her Glipizide as well.  She denies any hypoglycemia.  ) An ACE inhibitor/angiotensin II receptor blocker is being taken. She does not see a podiatrist.Eye exam is current.  Hyperlipidemia This is a chronic problem. The current episode started more than 1 year ago. The problem is controlled. Recent lipid tests were reviewed and are normal. Exacerbating diseases include chronic renal disease, diabetes and obesity. Factors aggravating her hyperlipidemia include fatty foods and beta blockers. Pertinent negatives include no chest pain, myalgias or shortness of breath. Current antihyperlipidemic treatment includes statins. The current treatment provides mild improvement of lipids. Compliance problems include adherence to diet and adherence to exercise.  Risk factors for coronary artery disease include diabetes mellitus, dyslipidemia, hypertension, obesity, post-menopausal and family history.  Hypertension This is a chronic problem. The current episode started more than 1 year ago. The problem has been gradually improving since onset. The problem is controlled. Pertinent negatives include no chest pain, headaches, palpitations or shortness of breath. There are no associated agents to hypertension. Risk factors for  coronary artery disease include diabetes mellitus, dyslipidemia, obesity, sedentary lifestyle and post-menopausal state. Past treatments include beta blockers, diuretics and angiotensin blockers. The current treatment provides moderate improvement. Compliance problems include diet and exercise.  Hypertensive end-organ damage includes kidney disease, CAD/MI and retinopathy. Identifiable causes of hypertension include chronic renal disease.   Review of systems  Constitutional: + stable body weight,  current Body mass index is 31.38 kg/m. , no fatigue, no subjective hyperthermia, no subjective hypothermia Eyes: no blurry vision, no xerophthalmia ENT: no sore throat, no nodules palpated in throat, no dysphagia/odynophagia, no hoarseness Cardiovascular: no chest pain, no shortness of breath, no palpitations, no leg swelling Respiratory: no cough, no shortness of breath Gastrointestinal: no nausea/vomiting/diarrhea Musculoskeletal: no muscle/joint aches Skin: no rashes, no hyperemia Neurological: no tremors, no numbness, no tingling, no dizziness Psychiatric: no depression, no anxiety   Objective:    BP 130/68   Pulse 72   Ht 5' 8.5" (1.74 m)   Wt 209 lb 6.4 oz (95 kg)   BMI 31.38 kg/m   Wt Readings from Last 3 Encounters:  08/04/21 209 lb 6.4 oz (95 kg)  06/29/21 210 lb 12.8 oz (95.6 kg)  05/27/21 211 lb 6.4 oz (95.9 kg)    BP Readings from Last 3 Encounters:  08/04/21 130/68  06/29/21 (!) 148/70  05/27/21 120/60     Physical Exam- Limited  Constitutional:  Body mass index is 31.38 kg/m. , not in acute distress, normal state of mind Eyes:  EOMI, no exophthalmos Neck: Supple Cardiovascular: RRR, no murmurs, rubs, or gallops, no edema Respiratory: Adequate breathing efforts, no crackles, rales, rhonchi, or wheezing Musculoskeletal: no gross deformities, strength intact in all four extremities, no gross restriction of joint movements Skin:  no rashes, no hyperemia Neurological:  no tremor with outstretched hands   Diabetic Foot Exam - Simple   Simple Foot Form Diabetic Foot exam was performed with the following findings: Yes 08/04/2021 11:28 AM  Visual Inspection No deformities, no ulcerations, no other skin breakdown bilaterally: Yes Sensation Testing Intact to touch and monofilament testing bilaterally: Yes Pulse Check Posterior Tibialis and Dorsalis pulse intact bilaterally: Yes Comments      CMP     Component Value Date/Time   NA 140 06/29/2021 1251   NA 139 05/27/2021 1001   K 4.2 06/29/2021 1251   CL 109 06/29/2021 1251   CO2 24 06/29/2021 1251   GLUCOSE 178 (H) 06/29/2021 1251   BUN 23 06/29/2021 1251   BUN 30 (H) 05/27/2021 1001   CREATININE 1.75 (H) 06/29/2021 1251   CREATININE 1.60 (H) 11/27/2018 1521   CALCIUM 9.2 06/29/2021 1251   PROT 7.2 05/27/2021 1001   ALBUMIN 4.3 05/27/2021 1001   AST 16 05/27/2021 1001   ALT 15 05/27/2021 1001   ALKPHOS 86 05/27/2021 1001   BILITOT 0.3 05/27/2021 1001   GFRNONAA 33 (L) 06/29/2021 1251   GFRNONAA 35 (L) 11/27/2018 1521   GFRAA 36 (L) 02/29/2020 0000   GFRAA 40 (L) 11/27/2018 1521     Diabetic Labs (most recent): Lab Results  Component Value Date   HGBA1C 8.0 (H) 05/27/2021   HGBA1C 9.6 04/06/2021   HGBA1C 6.8 12/03/2020     Lipid Panel ( most recent) Lipid Panel     Component Value Date/Time   CHOL 94 (L) 05/27/2021 1001   TRIG 52 05/27/2021 1001   HDL 38 (L) 05/27/2021 1001   CHOLHDL 2.5 05/27/2021 1001   CHOLHDL 3.4 10/17/2020 1533   VLDL 16 10/17/2020 1533   LDLCALC 43 05/27/2021 1001        Assessment & Plan:   1) DM type 2 causing vascular disease   - Stephanie Manning has currently uncontrolled symptomatic type 2 DM since 61 years of age.  She presents today with her meter, no logs, showing above target fasting and postprandial glycemic profile.  Her POCT  A1c today is 9.6% increasing from last visit of 6.8%.  She does admit she over indulged in foods over the  holiday season and she also reports she has not been taking her Trulicity on schedule due to pharmacy availability.  She admits she sometimes forgets her Glipizide as well.  She denies any hypoglycemia.    - I had a long discussion with her about the progressive nature of diabetes and the pathology behind its complications. -her diabetes is complicated by retinopathy, nephropathy, coronary artery disease and she remains at a high risk for more acute and chronic complications which include CAD, CVA, CKD, retinopathy, and neuropathy. These are all discussed in detail with her.  - Nutritional counseling repeated at each appointment due to patients tendency to fall back in to old habits.  - The patient admits there is a room for improvement in their diet and drink choices. -  Suggestion is made for the patient to avoid simple carbohydrates from their diet including Cakes, Sweet Desserts / Pastries, Ice Cream, Soda (diet and regular), Sweet Tea, Candies, Chips, Cookies, Sweet Pastries, Store Bought Juices, Alcohol in Excess of 1-2 drinks a day, Artificial Sweeteners, Coffee Creamer, and "Sugar-free" Products. This will help patient to have stable blood glucose profile and potentially avoid unintended weight gain.   - I encouraged the patient to switch to unprocessed or minimally processed complex starch and increased protein intake (animal or plant source), fruits, and vegetables.   - Patient is advised to stick to a routine mealtimes to eat 3 meals a day and avoid unnecessary snacks (to snack only to correct hypoglycemia).  - I have approached her with the following individualized plan to manage  her diabetes and patient agrees:   - she will benefit from simplification of her treatment regimen.  She will not need prandial insulin for now (has script from another provider and only takes it as needed).  -She is advised to continue Levemir 20 units SQ nightly and Trulicity 1.5 mg SQ every week  (constipation is now managed well).    -She is encouraged to continue monitoring blood glucose at least twice a day, before breakfast and before bed and report blood glucose less than 70 or above 300 for three tests in a row.  She could benefit from CGM device to prevent hypoglycemia, she wants to discuss with her cardiologist first since she is on blood thinners.  She did not do well with SGLT2 inhibitor Wilder Glade when it was prescribed by her cardiologist as it caused significant hypotension.  - she is warned not to take insulin without proper monitoring per orders.  - she is not a candidate for metformin, SGLT2 inhibitors due to concurrent renal insufficiency.  - Patient specific target  A1c;  LDL, HDL, Triglycerides, and  Waist Circumference were discussed in detail.  2) Blood Pressure /Hypertension:  -Her blood pressure is controlled to target.  She is advised to continue Carvedilol 12.5 mg p.o. twice daily, Isosorbide mononitrate 30 mg p.o. daily, Entresto 97-103 mg p.o. twice daily, and Aldactone 25 mg p.o. daily.  3) Lipids/Hyperlipidemia:    Her most recent lipid panel from 05/27/21 shows controlled LDL at 43.  She is advised to continue Lipitor 80 mg po daily at bedtime.  Side effects and precautions discussed with her.  4)  Weight/Diet:  Her Body mass index is 31.38 kg/m.-   clearly complicating her diabetes care.  She is a candidate for continued, modest weight loss.  I discussed with her  the fact that loss of 5 - 10% of her  current body weight will have the most impact on her diabetes management.  CDE Consult will be initiated . Exercise, and detailed carbohydrates information provided  -  detailed on discharge instructions.  5) Chronic Care/Health Maintenance: -she is on ACE/ARB and statin medications and is encouraged to initiate and continue to follow up with Ophthalmology, Dentist,  Podiatrist at least yearly or according to recommendations, and advised to stay away from  smoking. I have recommended yearly flu vaccine and pneumonia vaccine at least every 5 years; moderate intensity exercise for up to 150 minutes weekly; and  sleep for at least 7 hours a day.  - she is advised to maintain close follow up with Margo Common, Jarvis Newcomer, MD for primary care needs, as well as her other providers for optimal and coordinated care.     I spent 34 minutes in the care of the patient today including review of labs from [facility], Lipids, Thyroid Function, Hematology (current and previous including abstractions from other facilities); face-to-face time discussing  her blood glucose readings/logs, discussing hypoglycemia and hyperglycemia episodes and symptoms, medications doses, her options of short and long term treatment based on the latest standards of care / guidelines;  discussion about incorporating lifestyle medicine;  and documenting the encounter.    Please refer to Patient Instructions for Blood Glucose Monitoring and Insulin/Medications Dosing Guide"  in media tab for additional information. Please  also refer to " Patient Self Inventory" in the Media  tab for reviewed elements of pertinent patient history.  Stephanie Manning participated in the discussions, expressed understanding, and voiced agreement with the above plans.  All questions were answered to her satisfaction. she is encouraged to contact clinic should she have any questions or concerns prior to her return visit.   Follow up plan: - No follow-ups on file.  Rayetta Pigg, [facility] [address] [place], [place] [address] Phone: [phone] Fax: [phone]   08/04/2021, 11:11 AM

## 2021-08-14 ENCOUNTER — Other Ambulatory Visit: Payer: Self-pay

## 2021-08-14 MED ORDER — TRULICITY 1.5 MG/0.5ML ~~LOC~~ SOAJ: 1.5000 mg | SUBCUTANEOUS | 3 refills | Status: DC

## 2021-08-14 MED ORDER — INSULIN DETEMIR 100 UNIT/ML FLEXPEN: 20.0000 [IU] | PEN_INJECTOR | Freq: Every day | SUBCUTANEOUS | 1 refills | Status: DC

## 2021-09-03 ENCOUNTER — Telehealth: Payer: Self-pay | Admitting: Nurse Practitioner

## 2021-09-03 MED ORDER — TRULICITY 1.5 MG/0.5ML ~~LOC~~ SOAJ: 1.5000 mg | SUBCUTANEOUS | 3 refills | Status: DC

## 2021-09-03 MED ORDER — INSULIN DETEMIR 100 UNIT/ML FLEXPEN: 50.0000 [IU] | PEN_INJECTOR | Freq: Every day | SUBCUTANEOUS | 3 refills | Status: DC

## 2021-09-03 NOTE — Telephone Encounter (Signed)
Pt is asking why you reduced her Levmier and why she did not get a 90 day supply of it? She said she takes 30-40 units at bedtime depending on her sugar. She said she told you started back the Trulicity every week instead of every other week as well. Please advise

## 2021-09-03 NOTE — Telephone Encounter (Signed)
I must have overlooked it.  I changed the scripts and resent to the pharmacy on file for 90 day supply.

## 2021-09-28 ENCOUNTER — Telehealth: Payer: Self-pay | Admitting: Nurse Practitioner

## 2021-09-28 MED ORDER — TRULICITY 1.5 MG/0.5ML ~~LOC~~ SOAJ: 1.5000 mg | SUBCUTANEOUS | 0 refills | Status: DC

## 2021-09-28 NOTE — Telephone Encounter (Signed)
[facility] Rx sent [facility]

## 2021-09-28 NOTE — Telephone Encounter (Signed)
[facility] called and said they received a RX for Trulicity that states inject every 14 days but pt says she does it every week, if that is true, they need a updated RX

## 2021-09-30 ENCOUNTER — Telehealth: Payer: Self-pay | Admitting: Nurse Practitioner

## 2021-09-30 MED ORDER — INSULIN DETEMIR 100 UNIT/ML FLEXPEN: 50.0000 [IU] | PEN_INJECTOR | Freq: Every day | SUBCUTANEOUS | 3 refills | Status: DC

## 2021-09-30 NOTE — Telephone Encounter (Signed)
I sent in a new script for her, although it looks like I had sent one at the end of June for 50 units.  Maybe I had sent it to the wrong pharmacy.  Anyways, I took care of it.

## 2021-09-30 NOTE — Telephone Encounter (Signed)
[facility] called and said that the patient is needing a new RX on her Levemir. She told them she is taking 40 units and the script is 20 units. Please advise.

## 2021-09-30 NOTE — Telephone Encounter (Signed)
I see Levemir 20 units qhs. Pt states she takes 40 units qhs and in another phone note she stated she took 30-40 units.

## 2021-10-19 ENCOUNTER — Encounter (HOSPITAL_COMMUNITY): Payer: Self-pay

## 2021-10-19 ENCOUNTER — Ambulatory Visit (HOSPITAL_COMMUNITY)
Admission: RE | Admit: 2021-10-19 | Discharge: 2021-10-19 | Disposition: A | Discharge status: Home / Self Care | Payer: Medicare Other | Source: Ambulatory Visit | Attending: Cardiology | Admitting: Cardiology

## 2021-10-19 VITALS — BP 142/78 | HR 72 | Ht 68.0 in | Wt 212.0 lb

## 2021-10-19 DIAGNOSIS — E1122 Type 2 diabetes mellitus with diabetic chronic kidney disease: Secondary | ICD-10-CM | POA: Diagnosis not present

## 2021-10-19 DIAGNOSIS — I2510 Atherosclerotic heart disease of native coronary artery without angina pectoris: Secondary | ICD-10-CM | POA: Insufficient documentation

## 2021-10-19 DIAGNOSIS — Z7902 Long term (current) use of antithrombotics/antiplatelets: Secondary | ICD-10-CM | POA: Diagnosis not present

## 2021-10-19 DIAGNOSIS — Z7982 Long term (current) use of aspirin: Secondary | ICD-10-CM | POA: Diagnosis not present

## 2021-10-19 DIAGNOSIS — N183 Chronic kidney disease, stage 3 unspecified: Secondary | ICD-10-CM | POA: Diagnosis not present

## 2021-10-19 DIAGNOSIS — D509 Iron deficiency anemia, unspecified: Secondary | ICD-10-CM | POA: Diagnosis not present

## 2021-10-19 DIAGNOSIS — Z79899 Other long term (current) drug therapy: Secondary | ICD-10-CM | POA: Insufficient documentation

## 2021-10-19 DIAGNOSIS — I255 Ischemic cardiomyopathy: Secondary | ICD-10-CM | POA: Diagnosis not present

## 2021-10-19 DIAGNOSIS — I5022 Chronic systolic (congestive) heart failure: Secondary | ICD-10-CM | POA: Insufficient documentation

## 2021-10-19 DIAGNOSIS — I252 Old myocardial infarction: Secondary | ICD-10-CM | POA: Diagnosis not present

## 2021-10-19 DIAGNOSIS — I130 Hypertensive heart and chronic kidney disease with heart failure and stage 1 through stage 4 chronic kidney disease, or unspecified chronic kidney disease: Secondary | ICD-10-CM | POA: Diagnosis not present

## 2021-10-19 NOTE — Addendum Note (Signed)
Encounter addended by: Payton Mccallum, RN on: 10/19/2021 2:44 PM  Actions taken: Clinical Note Signed

## 2021-10-19 NOTE — Patient Instructions (Signed)
Thank you for coming in today    Do the following things EVERYDAY: Weigh yourself in the morning before breakfast. Write it down and keep it in a log. Take your medicines as prescribed Eat low salt foods--Limit salt (sodium) to 2000 mg per day.  Stay as active as you can everyday Limit all fluids for the day to less than 2 liters  No labs today   Your physician recommends that you schedule a follow-up appointment in:  4 months with Dr. [name]   At the [facility], you and your health needs are our priority. As part of our continuing mission to provide you with exceptional heart care, we have created designated Provider Care Teams. These Care Teams include your primary Cardiologist (physician) and Advanced Practice Providers (APPs- Physician Assistants and Nurse Practitioners) who all work together to provide you with the care you need, when you need it.   You may see any of the following providers on your designated Care Team at your next follow up: Dr Glori Bickers Dr Haynes Kerns, NP Lyda Jester, [place] [facility] [place], [place] Audry Riles, PharmD   Please be sure to bring in all your medications bottles to every appointment.   If you have any questions or concerns before your next appointment please send [name] a message through [place] or call our office at [phone].    TO LEAVE A MESSAGE FOR THE NURSE SELECT OPTION 2, PLEASE LEAVE A MESSAGE INCLUDING: YOUR NAME DATE OF BIRTH CALL BACK NUMBER REASON FOR CALL**this is important as we prioritize the call backs  YOU WILL RECEIVE A CALL BACK THE SAME DAY AS LONG AS YOU CALL BEFORE 4:00 PM

## 2021-10-19 NOTE — Progress Notes (Signed)
Date:  10/19/2021   ID:  [name], DOB March 30, 1960, MRN [id number]  Provider location: [facility] Type of Visit: Established patient  PCP:  [name], MD  HF Cardiology: Dr. [name]   History of Present Illness: Stephanie Manning is a 61 y.o. female who has a history of CAD and ischemic cardiomyopathy.  In 12/18, she had anterior MI with DES to totally occluded mid LAD (culprit). She returned in 1/19 for staged DES to 80% RCA stenosis. Echo in 12/18 showed EF 30-35%.  Repeat echo in 3/19 showed EF stable at 30-35%.     She was admitted in 7/19 with chest pain, concern for unstable angina.  Culprit of symptoms was thought to be 95% stenosis in a small PLV vessel, medical treatment planned.    She saw Dr. Rayann Heman and decided against ICD.   Echo was done in 6/20 showing stable EF 30-35%.    She was unable to tolerate Bidil 1/2 tablet tid due to lightheadedness. She was unable to tolerate dapagliflozin.   Echo in 11/21 showed EF 40-45% with apical septal and apical akinesis, normal RV.   Patient was admitted in 6/22 with poor po intake, feeling "sick."  She was noted to be dehydrated with hypotension, orthostasis, and AKI.  She was hydrated and Entresto and spironolactone were held.  Creatinine was as high as 3.28.   She got IV fluid and developed volume overload requiring Lasix.   Echo in 10/22 showed EF 45% with akinetic apex, normal RV.   She returns today for routein f/u. Reports doing well. Stable NYHA Class II symptoms. Denies wt gain. No LEE, orthopnea or PND. No CP. BP mildly elevated in clinic at 142/78 but much lower at home. Checks BP daily, and average is ~ 120/80. Reports full med compliance. PCP follows labs, recently checked and personally reviewed. SCr 1.9 c/w baseline, SCr 4.4, LDL at goal at 65. Hgb A1c was 7.6 (down from 8.0).    ECG: not performed    Labs (2/19): K 4.7, creatinine 1.48, hgb 8.4 Labs (5/19): LDL 65, K 4.7,  creatinine 1.66 Labs (8/19): K 5, creatinine 1.78  Labs (9/19): K 3.8, creatinine 1.6 Labs (12/19): K 3.9, creatinine 1.59 Labs (3/20): LDL 73, HDL 41 Labs (5/20): K 4.1, creatinine 1.59, LDL 73 Labs (9/20): K 4.2, creatinine 1.6 Labs (11/20): K 4.1, creatinine 1.7, LDL 50, HDL 32 Labs (3/21): K 4.1, creatinine 1.67 Labs (8/21): LDL 76, HDL 30, K 4, creatinine 1.86 Labs (12/21): LDL 41, HDL 25, TSH normal, K 4, creatinine 1.74 Labs (6/22): creatinine 3.28 => 1.96 => 1.94, K 3.8, AST 76, ALT 65 Labs (8/22): LDL 63, K 4.1, creatinine 1.69 Labs (3/23): LDL 43, K 4.4, creatinine 1.89 Labs (6/23): Scr 1.92, K 4.4, LDL 65, Hgb A1c 7.6    PMH: 1. Type II diabetes 2. CKD stage 3.  3. Hyperlipidemia 4. HTN 5. Fe deficiency anemia 6. GERD 7. CAD: Anterior MI in 12/18 with DES to 100% stenosed mid LAD (culprit), also with CTO of distal LAD, 80% pRCA, 85% mLCx/90% dLCx, 90% OM3.  - 1/19 staged PCI of proximal RCA.  - Admitted with unstable angina 7/19.  LHC: 75% stenosis proximal-mid LCx, 60% OM1, total occlusion of the distal LAD, patent mid LAD stent, patent RCA stents, 95% stenosis in small PLV branch (likely culprit).  8. Chronic systolic CHF: Ischemic cardiomyopathy.  - Echo (12/18): EF 30-35%.  - Echo (3/19): EF  30-35%, WMAs in LAD territory, RV mildly dilated.  - Echo (6/20): EF 30-35%, mild LV dilation, normal RV size and systolic function.  - Echo (11/21): EF 40-45% with apical septal and apical akinesis, normal RV. - Echo (10/22): EF 45% with akinetic apex, normal RV.   Current Outpatient Medications  Medication Sig Dispense Refill   ACCU-CHEK GUIDE test strip Check blood sugar 2 times a day before breakfast and every night before bedtime. 200 each 3   Accu-Chek Softclix Lancets lancets Use as instructed 100 each 2   aspirin 81 MG chewable tablet Chew 1 tablet (81 mg total) by mouth daily.     atorvastatin (LIPITOR) 80 MG tablet Take 80 mg by mouth daily.     blood glucose meter  kit and supplies Dispense based on patient and insurance preference. Use up to four times daily as directed. (FOR ICD-10 E11.65) Pt prefers One touch mini 1 each 5   carvedilol (COREG) 12.5 MG tablet Take 1 tablet (12.5 mg total) by mouth 2 (two) times daily with a meal. 180 tablet 3   Dulaglutide (TRULICITY) 1.5 QV/9.5GL SOPN Inject 1.5 mg into the skin once a week. 6 mL 0   ELDERBERRY PO Take 2 tablets by mouth every other day.      ferrous sulfate 300 (60 Fe) MG/5ML syrup Take 300 mg by mouth daily with breakfast.     furosemide (LASIX) 20 MG tablet Take 1 tablet (20 mg total) by mouth daily as needed. 90 tablet 3   gabapentin (NEURONTIN) 300 MG capsule Take 300 mg by mouth at bedtime.      insulin detemir (LEVEMIR) 100 UNIT/ML FlexPen Inject 50 Units into the skin at bedtime. 45 mL 3   Multiple Vitamin (MULTIVITAMIN WITH MINERALS) TABS tablet Take 1 tablet by mouth daily.     nitroGLYCERIN (NITROSTAT) 0.4 MG SL tablet Dissolve 1 tab under tongue for chest pain- if pain remains after 5 min, call 911, maxof 3 tabs in 15 minutes 25 tablet 2   NONFORMULARY OR COMPOUNDED ITEM Seamoss once daily     pantoprazole (PROTONIX) 40 MG tablet Take 1 tablet (40 mg total) by mouth 2 (two) times daily. 180 tablet 3   sacubitril-valsartan (ENTRESTO) 24-26 MG Take 1 tablet by mouth 2 (two) times daily.     Saline 0.2 % SOLN Place 1 spray into both nostrils as needed (congestion).      spironolactone (ALDACTONE) 25 MG tablet Take 1 tablet (25 mg total) by mouth daily. 90 tablet 3   ticagrelor (BRILINTA) 60 MG TABS tablet Take 1 tablet (60 mg total) by mouth 2 (two) times daily. 180 tablet 3   tiZANidine (ZANAFLEX) 4 MG tablet Take 4 mg by mouth at bedtime.     No current facility-administered medications for this encounter.    Allergies:   Hydralazine, Bidil [isosorb dinitrate-hydralazine], Ciprofloxacin, Farxiga [dapagliflozin], Hydralazine hcl, Isosorbide dinitrate, Ketorolac, Nsaids, Propoxycaine,  Amoxicillin, Hydrocodone, Lisinopril, Meloxicam, Strawberry extract, Tape, and Toradol [ketorolac tromethamine]   Social History:  The patient  reports that she has never smoked. She has never used smokeless tobacco. She reports that she does not drink alcohol and does not use drugs.   Family History:  The patient's family history includes Diabetes in her mother; Heart disease in her mother; Hypertension in her mother; Stroke in her mother.   ROS:  Please see the history of present illness.   All other systems are personally reviewed and negative.   Vitals:   BP Marland Kitchen)  142/78   Pulse 72   Ht _0  (1.727 m)   Wt 96.2 kg (212 lb)   SpO2 98%   BMI 32.23 kg/m  PHYSICAL EXAM: General:  Well appearing. No respiratory difficulty HEENT: normal Neck: supple. no JVD. Carotids 2+ bilat; no bruits. No lymphadenopathy or thyromegaly appreciated. Cor: PMI nondisplaced. Regular rate & rhythm. No rubs, gallops or murmurs. Lungs: clear Abdomen: soft, nontender, nondistended. No hepatosplenomegaly. No bruits or masses. Good bowel sounds. Extremities: no cyanosis, clubbing, rash, edema Neuro: alert & oriented x 3, cranial nerves grossly intact. moves all 4 extremities w/o difficulty. Affect pleasant.   Recent Labs: 05/27/2021: ALT 15 06/29/2021: B Natriuretic Peptide 252.6; BUN 23; Creatinine, Ser 1.75; Potassium 4.2; Sodium 140  Personally reviewed    Wt Readings from Last 3 Encounters:  10/19/21 96.2 kg (212 lb)  08/04/21 95 kg (209 lb 6.4 oz)  06/29/21 95.6 kg (210 lb 12.8 oz)    ASSESSMENT AND PLAN:  1. CAD: Anterior MI in 12/18 with DES to proximal LAD (culprit) followed by staged DES to proximal RCA.  7/19 admission with unstable angina, likely culprit was 95% stenosis in small PLV. This was managed conservatively.  Stable w/o anginal symptoms  - Continue atorvastatin, good lipids 6/23 (LDL 65 mg/dL).  - Continue ASA 81 daily and ticagrelor 60 mg bid.  - Continue ? blocker, Coreg 12.5 mg  bid  2. Chronic systolic CHF: Ischemic cardiomyopathy. Echo in 10/22 showed EF 45%.   - NYHA Class II (stable). Euvolemic on exam. BP mildly elevated here but much lower at home, 400 systolic. - Continue Coreg 12.5 mg bid (previously did not tolerate higher dose)  - Continue Entresto 24/26 bid, she did not tolerate higher dose in past with lightheadedness/hypotension.   - Continue spironolactone 25 mg daily.   - She was unable to tolerate Bidil at lowest does due to lightheadedness.   - She was unable to tolerate dapagliflozin.   - Continue Lasix PRN  - She is now out of ICD range. When EF was lower, she refused ICD.    3. CKD: stage 3.   - b/l SCr ~1.9 - did not tolerate SLGT2i  - Labs followed by PCP. BMP recently checked and I personally reviewed results. SCr and K stable  4. Fe deficiency anemia: Long-standing.  She has had IV Fe infusions. Now followed by PCP.  - on PO Fe  5. Type II DM:  - most recent Hgb A1c 7.6 - managed by PCP  - Unable to tolerate dapagliflozin.   F/u w/ Dr. [name] in 4 months   Signed, Lyda Jester, PA-C  10/19/2021  [place] [address] [phone] (office) [phone] (fax)

## 2021-11-09 ENCOUNTER — Other Ambulatory Visit (HOSPITAL_COMMUNITY): Payer: Self-pay

## 2021-11-09 MED ORDER — ENTRESTO 24-26 MG PO TABS: 1.0000 | ORAL_TABLET | Freq: Two times a day (BID) | ORAL | 8 refills | Status: DC

## 2021-11-27 NOTE — Progress Notes (Unsigned)
Cardiology Office Note    Date:  12/01/2021   ID:  Stephanie, Manning [birth year], MRN [id number]  PCP:  [name], MD  Cardiologist: Dr. [name] Heart failure clinic: Dr. [name]  Chief Complaint  Patient presents with   Congestive Heart Failure    History of Present Illness:  Stephanie Manning is a 61 y.o. female with PMH of CAD, DM II, CKD stage IV, HTN, HLD, CAD and ICM.  In December 2018 she had anterior MI with DES to totally occluded mid LAD.  She returned in January 2019 for staged DES to 80% RCA disease.  Echocardiogram in December 2018 showed ejection fraction of 30 to 35%.  Repeat echocardiogram in March 2019 continue to show ejection fraction 30 to 35%.  She has been followed by Dr [name] in the Advanced heart failure clinic.  Given her persistent low ejection fraction, she was referred to electrophysiology service for consideration of ICD.  She was admitted to the hospital on 10/10/2017 with recurrent chest pain.  She underwent Myoview which came back high risk.  She eventually underwent cardiac catheterization on 10/12/2017 which showed 75% prox LCx, 60% OM1, 40% LM, 100% mid LAD chronic occlusion post D2, 95% post lateral lesion.  Stents in the RCA and proximal LAD were patent.   She was recommended to continue aspirin and Brilinta for long-term, beyond 98-month  Carvedilol was reduced to 6.25 mg twice daily due to bradycardia, Mobitz type I and up to 2 dropped beats and dizziness.    She has been followed closely by Dr MAundra Dubinin the CHF clinic. She is on Coreg, Entresto, and aldactone. She had follow up with Echo in June 2020 and EF was unchanged. She has been seen by Dr ARayann Hemanfor consideration of ICD but has deferred. She is intolerant of Bidil due to lightheadedness/syncope. Also intolerant of FIran She was seen by Dr MAundra Dubinin November  2021.  EF improved to 40-45%. Noted aldactone had been held due to elevated potassium. This was resumed at lower dose. Repeat  potassium was 4.0  Patient was admitted in 6/22 with poor po intake, feeling "sick."  She was noted to be dehydrated with hypotension, orthostasis, and AKI.  She was hydrated and Entresto and spironolactone were held.  Creatinine was as high as 3.28.   She got IV fluid and developed volume overload requiring Lasix.    Echo 10/22 EF 45% with akinetic apex, normal RV.   Was seen in the CHF clinic in January. Was considered for increased Entresto dose but given kidney function it was maintained at 24/26 mg bid.   Last seen in Heart failure clinic in August and stable.   On follow up today she is doing very well. She denies any chest pain. Once had a episode of indigestion resolved with Protonix. She is active gardening and walking.   Denies any SOB or increased edema. Weight is stable.    Past Medical History:  Diagnosis Date   [place]   CAD in native artery    a. NSTEMI 02/2017 s/p DES to mLAD, diffuse disease otherwise, EF 30-35%. b. Staged cath 03/2017 - patent stent in the proximal to mid LAD. CTO of the distal LAD, 90% diffuse small first diagonal, 90% distal LCx/OM, 80% diffuse prox RCA, moderately elevated LVEDP 241mg, s/p DES to mRCA, residual disease treated medically (too small for PCI), EF 30-35%.   Chronic combined systolic and diastolic CHF (congestive heart failure) (  [place])    CKD (chronic kidney disease), stage IV ([place])    [name] 04/21/2017   Diabetes mellitus, type II, insulin dependent (HCC)    GERD (gastroesophageal reflux disease)    History of blood transfusion    "related to menses"   History of stomach ulcers 2017   Hyperlipidemia    Hypertension    Iron deficiency anemia 03/2017   "had to have iron infusions" (10/10/2017)   Ischemic cardiomyopathy    STEMI (ST elevation myocardial infarction) ([place]) 02/2017    Past Surgical History:  Procedure Laterality Date   CESAREAN SECTION  1987   CORONARY ANGIOPLASTY WITH STENT PLACEMENT   04/01/2017   drug-eluting stent was successfully placed using a STENT SYNERGY DES [id number]   CORONARY STENT INTERVENTION N/A 04/01/2017   Procedure: CORONARY STENT INTERVENTION;  Surgeon: [name], Sidrah Harden M, MD;  Location: [facility];  Service: Cardiovascular;  Laterality: N/A;   CORONARY/GRAFT ACUTE MI REVASCULARIZATION N/A 03/10/2017   Procedure: Coronary/Graft Acute MI Revascularization;  Surgeon: [name], Markavious Micco M, MD;  Location: [facility];  Service: Cardiovascular;  Laterality: N/A;   LEFT HEART CATH AND CORONARY ANGIOGRAPHY N/A 03/10/2017   Procedure: LEFT HEART CATH AND CORONARY ANGIOGRAPHY;  Surgeon: [name], Alyvia Derk M, MD;  Location: [facility];  Service: Cardiovascular;  Laterality: N/A;   LEFT HEART CATH AND CORONARY ANGIOGRAPHY N/A 04/01/2017   Procedure: LEFT HEART CATH AND CORONARY ANGIOGRAPHY;  Surgeon: [name], Demonta Wombles M, MD;  Location: [facility];  Service: Cardiovascular;  Laterality: N/A;   LEFT HEART CATH AND CORONARY ANGIOGRAPHY N/A 10/12/2017   Procedure: LEFT HEART CATH AND CORONARY ANGIOGRAPHY;  Surgeon: Sherren Mocha, MD;  Location: [facility];  Service: Cardiovascular;  Laterality: N/A;   OOPHORECTOMY     "1 ovary removed"   THROAT SURGERY  02/2014   "nodule in my throat removed; not related to thyroid"   TOOTH EXTRACTION  07/2021    Current Medications: Outpatient Medications Prior to Visit  Medication Sig Dispense Refill   ACCU-CHEK GUIDE test strip Check blood sugar 2 times a day before breakfast and every night before bedtime. 200 each 3   Accu-Chek Softclix Lancets lancets Use as instructed 100 each 2   aspirin 81 MG chewable tablet Chew 1 tablet (81 mg total) by mouth daily.     atorvastatin (LIPITOR) 80 MG tablet Take 80 mg by mouth daily.     blood glucose meter kit and supplies Dispense based on patient and insurance preference. Use up to four times daily as directed. (FOR ICD-10 E11.65) Pt prefers One touch mini 1 each 5    carvedilol (COREG) 12.5 MG tablet Take 1 tablet (12.5 mg total) by mouth 2 (two) times daily with a meal. 180 tablet 3   Dulaglutide (TRULICITY) 1.5 QV/9.5GL SOPN Inject 1.5 mg into the skin once a week. 6 mL 0   ELDERBERRY PO Take 2 tablets by mouth every other day.      ferrous sulfate 300 (60 Fe) MG/5ML syrup Take 300 mg by mouth daily with breakfast.     furosemide (LASIX) 20 MG tablet Take 1 tablet (20 mg total) by mouth daily as needed. 90 tablet 3   gabapentin (NEURONTIN) 300 MG capsule Take 300 mg by mouth at bedtime.      insulin detemir (LEVEMIR) 100 UNIT/ML FlexPen Inject 50 Units into the skin at bedtime. 45 mL 3   Multiple Vitamin (MULTIVITAMIN WITH MINERALS) TABS tablet Take 1 tablet by  mouth daily.     nitroGLYCERIN (NITROSTAT) 0.4 MG SL tablet Dissolve 1 tab under tongue for chest pain- if pain remains after 5 min, call 911, maxof 3 tabs in 15 minutes 25 tablet 2   NONFORMULARY OR COMPOUNDED ITEM Seamoss once daily     pantoprazole (PROTONIX) 40 MG tablet Take 1 tablet (40 mg total) by mouth 2 (two) times daily. 180 tablet 3   sacubitril-valsartan (ENTRESTO) 24-26 MG Take 1 tablet by mouth 2 (two) times daily. 60 tablet 8   Saline 0.2 % SOLN Place 1 spray into both nostrils as needed (congestion).      spironolactone (ALDACTONE) 25 MG tablet Take 1 tablet (25 mg total) by mouth daily. 90 tablet 3   ticagrelor (BRILINTA) 60 MG TABS tablet Take 1 tablet (60 mg total) by mouth 2 (two) times daily. 180 tablet 3   tiZANidine (ZANAFLEX) 4 MG tablet Take 4 mg by mouth at bedtime.     No facility-administered medications prior to visit.     Allergies:   Hydralazine, Bidil [isosorb dinitrate-hydralazine], Ciprofloxacin, Farxiga [dapagliflozin], Hydralazine hcl, Isosorbide dinitrate, Ketorolac, Nsaids, Propoxycaine, Amoxicillin, Hydrocodone, Lisinopril, Meloxicam, Strawberry extract, Tape, and Toradol [ketorolac tromethamine]   Social History   Socioeconomic History   Marital status:  Widowed    Spouse name: Not on file   Number of children: 2   Years of education: Not on file   Highest education level: Not on file  Occupational History   Not on file  Tobacco Use   Smoking status: Never   Smokeless tobacco: Never  Vaping Use   Vaping Use: Never used  Substance and Sexual Activity   Alcohol use: Never   Drug use: Never   Sexual activity: Yes  Other Topics Concern   Not on file  Social History Narrative   Not on file   Social Determinants of Health   Financial Resource Strain: Not on file  Food Insecurity: Not on file  Transportation Needs: Not on file  Physical Activity: Not on file  Stress: Not on file  Social Connections: Not on file     Family History:  The patient's family history includes Diabetes in her mother; Heart disease in her mother; Hypertension in her mother; Stroke in her mother.   ROS:   Please see the history of present illness.    ROS All other systems reviewed and are negative.   PHYSICAL EXAM:   VS:  BP 138/66   Pulse 64   Ht 5' 8.5" (1.74 m)   Wt 205 lb 12.8 oz (93.4 kg)   SpO2 99%   BMI 30.84 kg/m    GEN: Well nourished, well developed, in no acute distress  HEENT: normal  Neck: no JVD, carotid bruits, or masses Cardiac: RRR; no murmurs, rubs, or gallops,no edema  Respiratory:  clear to auscultation bilaterally, normal work of breathing GI: soft, nontender, nondistended, + BS MS: no deformity or atrophy  Skin: warm and dry, no rash Neuro:  Alert and Oriented x 3, Strength and sensation are intact Psych: euthymic mood, full affect  Wt Readings from Last 3 Encounters:  12/01/21 205 lb 12.8 oz (93.4 kg)  10/19/21 212 lb (96.2 kg)  08/04/21 209 lb 6.4 oz (95 kg)      Studies/Labs Reviewed:   EKG:  EKG is not ordered today.    Recent Labs: 05/27/2021: ALT 15 06/29/2021: B Natriuretic Peptide 252.6; BUN 23; Creatinine, Ser 1.75; Potassium 4.2; Sodium 140   Lipid Panel  Component Value Date/Time   CHOL 94 (L)  05/27/2021 1001   TRIG 52 05/27/2021 1001   HDL 38 (L) 05/27/2021 1001   CHOLHDL 2.5 05/27/2021 1001   CHOLHDL 3.4 10/17/2020 1533   VLDL 16 10/17/2020 1533   LDLCALC 43 05/27/2021 1001   Labs dated 04/30/19: Hgb 9.6. BUN 24, creatinine 1.62. otherwise CMET normal. Glucose 80 Dated 04/16/21 creatinine 1.54.  Dated 09/08/21: cholesterol 122, triglycerides 79, HDL 40, LDL 65. BUN 27, creatinine 1.92, other chemistries OK. Hgb 10.2. ferritin 134. A1c 7.6%.   Additional studies/ records that were reviewed today include:   Cath 10/12/2017 Ost LM lesion is 40% stenosed. Prox Cx to Dist Cx lesion is 75% stenosed. Ost 1st Mrg lesion is 60% stenosed. Previously placed Prox LAD to Mid LAD stent (unknown type) is widely patent. Mid LAD lesion is 100% stenosed. Ost 2nd Diag to 2nd Diag lesion is 70% stenosed. Prox RCA lesion is 30% stenosed. Mid RCA lesion is 40% stenosed. Post Atrio lesion is 95% stenosed.   1.  Continued patency of the stented segment in the proximal LAD and stented segment in the proximal RCA 2.  Progressive small vessel CAD involving the distal left circumflex and right posterolateral branch 3.  Chronic occlusion of the mid LAD beyond the second diagonal branch unchanged in appearance from the previous study 4.  Normal LVEDP   Recommendation: Ongoing aggressive medical therapy.  The patient's probable culprit lesion is a severe stenosis in the distal posterior lateral branch involving a small territory and small caliber vessel that I think would be best treated with medical therapy rather than intervention.   Recommend dual antiplatelet therapy with Aspirin 16m daily and Ticagrelor 949mtwice daily long-term (beyond 12 months) because of diffuse CAD/diabetes/multivessel stenting.  Echo 09/01/18: IMPRESSIONS      1. The left ventricle has moderate-severely reduced systolic function, with an ejection fraction of 30-35%. The cavity size was mildly dilated. Left ventricular  diastolic Doppler parameters are consistent with pseudonormalization.  2. The right ventricle has normal systolic function. The cavity was normal.  3. The mitral valve is grossly normal.  4. The aortic valve is tricuspid. Mild thickening of the aortic valve. No stenosis of the aortic valve.  5. Technically difficult; definity used; akinesis of the apex with overall moderate to severe LV dysfunction; moderate diastolic dysfunction.  Echo 01/28/20: IMPRESSIONS     1. Left ventricular ejection fraction, by estimation, is 40 to 45%. The  left ventricle has mildly decreased function. The left ventricle  demonstrates regional wall motion abnormalities with apical septal and  apical akinesis. There is mild left  ventricular hypertrophy. Left ventricular diastolic parameters are  consistent with Grade I diastolic dysfunction (impaired relaxation).   2. Right ventricular systolic function is normal. The right ventricular  size is normal. Tricuspid regurgitation signal is inadequate for assessing  PA pressure.   3. The mitral valve is normal in structure. No evidence of mitral valve  regurgitation. No evidence of mitral stenosis.   4. The aortic valve is tricuspid. Aortic valve regurgitation is not  visualized.   5. The inferior vena cava is normal in size with <50% respiratory  variability, suggesting right atrial pressure of 8 mmHg.   Echo 12/23/20: IMPRESSIONS     1. Left ventricular ejection fraction, by estimation, is 45%. The left  ventricle has mildly decreased function. The left ventricle demonstrates  regional wall motion abnormalities with apical anterior, apical inferior,  and true apex akinesis. The left  ventricular internal cavity size was mildly dilated. Left ventricular  diastolic parameters are consistent with Grade II diastolic dysfunction  (pseudonormalization).   2. Right ventricular systolic function is normal. The right ventricular  size is normal. Tricuspid  regurgitation signal is inadequate for assessing  PA pressure.   3. The mitral valve is normal in structure. Trivial mitral valve  regurgitation. No evidence of mitral stenosis.   4. The aortic valve is tricuspid. Aortic valve regurgitation is not  visualized. No aortic stenosis is present.   5. The inferior vena cava is normal in size with greater than 50%  respiratory variability, suggesting right atrial pressure of 3 mmHg.    ASSESSMENT:    1. Chronic systolic heart failure ([place])   2. Coronary artery disease involving native coronary artery of native heart without angina pectoris   3. CKD (chronic kidney disease), stage IV ([place])   4. Hyperlipidemia with target LDL less than 100       PLAN:  In order of problems listed above:  Chronic combined systolic and diastolic heart failure: EF better at 40-45%.  On maximal Coreg due to history of bradycardia and presence of Mobitz 1 heart block during prior  hospitalization.  On Entresto maximal dose and aldactone. Unable to tolerate Bidil due to lightheadedness but is tolerating Imdur OK.  Intolerant of [place]. Clinically euvolemic.  CAD: s/p DES of LAD in 2018 in setting of anterior MI. S/p DES of RCA in January 2019. I reviewed her last cardiac cath in July 2019. She has extensive small distal branch vessel disease in distal LAD (CTO), small diagonal, distal LCX, and 95% PL lesion. these are not amenable to PCI due to small vessel size. High risk Myoview in July 2019 with cardiac cath as noted. Medical management.  Class 1  angina. On DAPT, statin, Coreg and Imdur.   Hypertension: Blood pressure well controlled   Hyperlipidemia: On Lipitor 80 mg daily.  LDL 65 at goal.   DM2: managed by endocrinology. A1c 7.6%  6.   CKD stage IV  Follow up in 6 months.  Medication Adjustments/Labs and Tests Ordered: Current medicines are reviewed at length with the patient today.  Concerns regarding medicines are outlined above.  Medication changes,  Labs and Tests ordered today are listed in the Patient Instructions below. There are no Patient Instructions on file for this visit.    Signed, Seema Blum [name], MD  12/01/2021 11:51 AM    [address], [place], [place]  [address] Phone: [phone]; Fax: [phone]

## 2021-11-30 ENCOUNTER — Ambulatory Visit: Admitting: Cardiology

## 2021-12-01 ENCOUNTER — Encounter: Payer: Self-pay | Admitting: Cardiology

## 2021-12-01 ENCOUNTER — Ambulatory Visit: Attending: Cardiology | Admitting: Cardiology

## 2021-12-01 VITALS — BP 138/66 | HR 64 | Ht 68.5 in | Wt 205.8 lb

## 2021-12-01 DIAGNOSIS — I5022 Chronic systolic (congestive) heart failure: Secondary | ICD-10-CM

## 2021-12-01 DIAGNOSIS — N184 Chronic kidney disease, stage 4 (severe): Secondary | ICD-10-CM | POA: Diagnosis not present

## 2021-12-01 DIAGNOSIS — I2510 Atherosclerotic heart disease of native coronary artery without angina pectoris: Secondary | ICD-10-CM | POA: Diagnosis not present

## 2021-12-01 DIAGNOSIS — E785 Hyperlipidemia, unspecified: Secondary | ICD-10-CM

## 2021-12-10 ENCOUNTER — Ambulatory Visit (INDEPENDENT_AMBULATORY_CARE_PROVIDER_SITE_OTHER): Payer: Medicare Other | Admitting: Nurse Practitioner

## 2021-12-10 ENCOUNTER — Encounter: Payer: Self-pay | Admitting: Nurse Practitioner

## 2021-12-10 VITALS — BP 135/78 | HR 76 | Ht 68.5 in | Wt 207.4 lb

## 2021-12-10 DIAGNOSIS — N1832 Chronic kidney disease, stage 3b: Secondary | ICD-10-CM

## 2021-12-10 DIAGNOSIS — E782 Mixed hyperlipidemia: Secondary | ICD-10-CM

## 2021-12-10 DIAGNOSIS — Z794 Long term (current) use of insulin: Secondary | ICD-10-CM

## 2021-12-10 DIAGNOSIS — I10 Essential (primary) hypertension: Secondary | ICD-10-CM | POA: Diagnosis not present

## 2021-12-10 DIAGNOSIS — E1122 Type 2 diabetes mellitus with diabetic chronic kidney disease: Secondary | ICD-10-CM

## 2021-12-10 LAB — POCT GLYCOSYLATED HEMOGLOBIN (HGB A1C): Hemoglobin A1C: 7.8 % — AB (ref 4.0–5.6)

## 2021-12-10 MED ORDER — TRULICITY 1.5 MG/0.5ML ~~LOC~~ SOAJ: 1.5000 mg | SUBCUTANEOUS | 3 refills | Status: DC

## 2021-12-10 NOTE — Progress Notes (Signed)
12/10/2021, 11:47 AM  Endocrinology Follow Up Visit   Subjective:    Patient ID: Stephanie Manning, female    DOB: [birth year].  Stephanie Manning is being seen in follow-up  for management of currently uncontrolled symptomatic diabetes requested by  [name], MD.   Past Medical History:  Diagnosis Date   AKI (acute kidney injury) ([place]) 02/2017   CAD in native artery    a. NSTEMI 02/2017 s/p DES to mLAD, diffuse disease otherwise, EF 30-35%. b. Staged cath 03/2017 - patent stent in the proximal to mid LAD. CTO of the distal LAD, 90% diffuse small first diagonal, 90% distal LCx/OM, 80% diffuse prox RCA, moderately elevated LVEDP 50mHg, s/p DES to mRCA, residual disease treated medically (too small for PCI), EF 30-35%.   Chronic combined systolic and diastolic CHF (congestive heart failure) ([facility]    CKD (chronic kidney disease), stage IV ([facility]    /[name]2/09/2017   Diabetes mellitus, type II, insulin dependent (HCC)    GERD (gastroesophageal reflux disease)    History of blood transfusion    "related to menses"   History of stomach ulcers 2017   Hyperlipidemia    Hypertension    Iron deficiency anemia 03/2017   "had to have iron infusions" (10/10/2017)   Ischemic cardiomyopathy    STEMI (ST elevation myocardial infarction) ([facility] 02/2017    Past Surgical History:  Procedure Laterality Date   CESAREAN SECTION  1987   CORONARY ANGIOPLASTY WITH STENT PLACEMENT  04/01/2017   drug-eluting stent was successfully placed using a STENT SYNERGY DES [id number]   CORONARY STENT INTERVENTION N/A 04/01/2017   Procedure: CORONARY STENT INTERVENTION;  Surgeon: JMartinique [name], MD;  [facility];  Service: Cardiovascular;  Laterality: N/A;   CORONARY/GRAFT ACUTE MI REVASCULARIZATION N/A 03/10/2017   Procedure: Coronary/Graft Acute MI Revascularization;  Surgeon: JMartinique [name], MD;   [facility];  Service: Cardiovascular;  Laterality: N/A;   LEFT HEART CATH AND CORONARY ANGIOGRAPHY N/A 03/10/2017   Procedure: LEFT HEART CATH AND CORONARY ANGIOGRAPHY;  Surgeon: JMartinique [name], MD;  [facility];  Service: Cardiovascular;  Laterality: N/A;   LEFT HEART CATH AND CORONARY ANGIOGRAPHY N/A 04/01/2017   Procedure: LEFT HEART CATH AND CORONARY ANGIOGRAPHY;  Surgeon: JMartinique [name], MD;  [facility];  Service: Cardiovascular;  Laterality: N/A;   LEFT HEART CATH AND CORONARY ANGIOGRAPHY N/A 10/12/2017   Procedure: LEFT HEART CATH AND CORONARY ANGIOGRAPHY;  Surgeon: CSherren Mocha MD;  [facility];  Service: Cardiovascular;  Laterality: N/A;   OOPHORECTOMY     "1 ovary removed"   THROAT SURGERY  02/2014   "nodule in my throat removed; not related to thyroid"   TOOTH EXTRACTION  07/2021    Social History   Socioeconomic History   Marital status: Widowed    Spouse name: Not on file   Number of children: 2   Years of education: Not on file   Highest education level: Not on file  Occupational History   Not on file  Tobacco Use  Smoking status: Never   Smokeless tobacco: Never  Vaping Use   Vaping Use: Never used  Substance and Sexual Activity   Alcohol use: Never   Drug use: Never   Sexual activity: Yes  Other Topics Concern   Not on file  Social History Narrative   Not on file   Social Determinants of Health   Financial Resource Strain: Not on file  Food Insecurity: Not on file  Transportation Needs: Not on file  Physical Activity: Not on file  Stress: Not on file  Social Connections: Not on file    Family History  Problem Relation Age of Onset   Hypertension Mother    Heart disease Mother    Diabetes Mother    Stroke Mother     Outpatient Encounter Medications as of 12/10/2021  Medication Sig   ACCU-CHEK GUIDE test strip Check blood sugar 2 times a day before breakfast and every  night before bedtime.   Accu-Chek Softclix Lancets lancets Use as instructed   aspirin 81 MG chewable tablet Chew 1 tablet (81 mg total) by mouth daily.   atorvastatin (LIPITOR) 80 MG tablet Take 80 mg by mouth daily.   blood glucose meter kit and supplies Dispense based on patient and insurance preference. Use up to four times daily as directed. (FOR ICD-10 E11.65) Pt prefers One touch mini   carvedilol (COREG) 12.5 MG tablet Take 1 tablet (12.5 mg total) by mouth 2 (two) times daily with a meal.   ELDERBERRY PO Take 2 tablets by mouth every other day.    ferrous sulfate 300 (60 Fe) MG/5ML syrup Take 300 mg by mouth daily with breakfast.   furosemide (LASIX) 20 MG tablet Take 1 tablet (20 mg total) by mouth daily as needed.   gabapentin (NEURONTIN) 300 MG capsule Take 300 mg by mouth at bedtime.    insulin detemir (LEVEMIR) 100 UNIT/ML FlexPen Inject 50 Units into the skin at bedtime.   Multiple Vitamin (MULTIVITAMIN WITH MINERALS) TABS tablet Take 1 tablet by mouth daily.   nitroGLYCERIN (NITROSTAT) 0.4 MG SL tablet Dissolve 1 tab under tongue for chest pain- if pain remains after 5 min, call 911, maxof 3 tabs in 15 minutes   NONFORMULARY OR COMPOUNDED ITEM Seamoss once daily   pantoprazole (PROTONIX) 40 MG tablet Take 1 tablet (40 mg total) by mouth 2 (two) times daily.   sacubitril-valsartan (ENTRESTO) 24-26 MG Take 1 tablet by mouth 2 (two) times daily.   Saline 0.2 % SOLN Place 1 spray into both nostrils as needed (congestion).    spironolactone (ALDACTONE) 25 MG tablet Take 1 tablet (25 mg total) by mouth daily.   ticagrelor (BRILINTA) 60 MG TABS tablet Take 1 tablet (60 mg total) by mouth 2 (two) times daily.   tiZANidine (ZANAFLEX) 4 MG tablet Take 4 mg by mouth at bedtime.   [DISCONTINUED] Dulaglutide (TRULICITY) 1.5 YJ/8.5UD SOPN Inject 1.5 mg into the skin once a week.   Dulaglutide (TRULICITY) 1.5 JS/9.7WY SOPN Inject 1.5 mg into the skin once a week.   No facility-administered  encounter medications on file as of 12/10/2021.    ALLERGIES: Allergies  Allergen Reactions   Hydralazine Other (See Comments)    Syncope due to hypotension   Bidil [Isosorb Dinitrate-Hydralazine]    Ciprofloxacin    Farxiga [Dapagliflozin]    Hydralazine Hcl    Isosorbide Dinitrate    Ketorolac    Nsaids    Propoxycaine    Amoxicillin Other (See Comments)    Yeast  infection during treatment   Hydrocodone Palpitations, Rash and Other (See Comments)    GI upset, also   Lisinopril Palpitations and Other (See Comments)    Chest pain, too   Meloxicam Nausea Only   Strawberry Extract Hives    Does not always happen   Tape Rash    Some tapes cause rashes   Toradol [Ketorolac Tromethamine] Palpitations    VACCINATION STATUS:  There is no immunization history on file for this patient.  Diabetes She presents for her follow-up diabetic visit. She has type 2 diabetes mellitus. Onset time: She was diagnosed at approximate age of 64 years. Her disease course has been stable. There are no hypoglycemic associated symptoms. Pertinent negatives for hypoglycemia include no confusion, headaches, pallor or seizures. Pertinent negatives for diabetes include no chest pain, no fatigue, no polydipsia, no polyphagia and no polyuria. There are no hypoglycemic complications. Symptoms are stable. Diabetic complications include heart disease, nephropathy, peripheral neuropathy and retinopathy. Risk factors for coronary artery disease include diabetes mellitus, dyslipidemia, hypertension, obesity, sedentary lifestyle and post-menopausal. Current diabetic treatment includes insulin injections and oral agent (monotherapy) (and Trulicity every other week). She is compliant with treatment most of the time. Her weight is fluctuating minimally. She is following a generally healthy diet. When asked about meal planning, she reported none. She has not had a previous visit with a dietitian. She never participates in  exercise. Her home blood glucose trend is increasing steadily. Her overall blood glucose range is 180-200 mg/dl. (She presents today with her meter, no logs, showing worsening fasting glycemic profile over recent weeks.  Her POCT A1c today is 7.8%, increasing from last visit of 7.6%.  She has been stretching out her test strips due to cost.  She recently was bitten by a tick and has noticed that when she eats beef, she gets violent diarrhea.  Analysis of her meter shows 7-day average of 191 (4readings); 14-day average of 195 (5readings); 30-day average of 184 (17readings); 90-day average of 169 (56readings).) An ACE inhibitor/angiotensin II receptor blocker is being taken. She does not see a podiatrist.Eye exam is current.  Hyperlipidemia This is a chronic problem. The current episode started more than 1 year ago. The problem is controlled. Recent lipid tests were reviewed and are normal. Exacerbating diseases include chronic renal disease, diabetes and obesity. Factors aggravating her hyperlipidemia include fatty foods and beta blockers. Pertinent negatives include no chest pain, myalgias or shortness of breath. Current antihyperlipidemic treatment includes statins. The current treatment provides mild improvement of lipids. Compliance problems include adherence to diet and adherence to exercise.  Risk factors for coronary artery disease include diabetes mellitus, dyslipidemia, hypertension, obesity, post-menopausal and family history.  Hypertension This is a chronic problem. The current episode started more than 1 year ago. The problem has been gradually improving since onset. The problem is controlled. Pertinent negatives include no chest pain, headaches, palpitations or shortness of breath. There are no associated agents to hypertension. Risk factors for coronary artery disease include diabetes mellitus, dyslipidemia, obesity, sedentary lifestyle and post-menopausal state. Past treatments include beta  blockers, diuretics and angiotensin blockers. The current treatment provides moderate improvement. Compliance problems include diet and exercise.  Hypertensive end-organ damage includes kidney disease, CAD/MI and retinopathy. Identifiable causes of hypertension include chronic renal disease.    Review of systems  Constitutional: + minimally fluctuating body weight,  current Body mass index is 31.08 kg/m. , no fatigue, no subjective hyperthermia, no subjective hypothermia, decreased appetite Eyes: no blurry vision,  no xerophthalmia ENT: no sore throat, no nodules palpated in throat, no dysphagia/odynophagia, no hoarseness Cardiovascular: no chest pain, no shortness of breath, no palpitations, no leg swelling Respiratory: no cough, no shortness of breath Gastrointestinal: no nausea/vomiting/diarrhea Musculoskeletal: no muscle/joint aches Skin: no rashes, no hyperemia, area under right breast itches (recent tick bite) Neurological: no tremors, no numbness, no tingling, no dizziness Psychiatric: no depression, no anxiety   Objective:    BP 135/78 (BP Location: Right Arm, Patient Position: Sitting, Cuff Size: Large)   Pulse 76   Ht 5' 8.5" (1.74 m)   Wt 207 lb 6.4 oz (94.1 kg)   BMI 31.08 kg/m   Wt Readings from Last 3 Encounters:  12/10/21 207 lb 6.4 oz (94.1 kg)  12/01/21 205 lb 12.8 oz (93.4 kg)  10/19/21 212 lb (96.2 kg)    BP Readings from Last 3 Encounters:  12/10/21 135/78  12/01/21 138/66  10/19/21 (!) 142/78     Physical Exam- Limited  Constitutional:  Body mass index is 31.08 kg/m. , not in acute distress, normal state of mind Eyes:  EOMI, no exophthalmos Neck: Supple Cardiovascular: RRR, no murmurs, rubs, or gallops, no edema Respiratory: Adequate breathing efforts, no crackles, rales, rhonchi, or wheezing Musculoskeletal: no gross deformities, strength intact in all four extremities, no gross restriction of joint movements Skin:  no rashes, no  hyperemia Neurological: no tremor with outstretched hands   Diabetic Foot Exam - Simple   No data filed      CMP     Component Value Date/Time   NA 140 06/29/2021 1251   NA 139 05/27/2021 1001   K 4.2 06/29/2021 1251   CL 109 06/29/2021 1251   CO2 24 06/29/2021 1251   GLUCOSE 178 (H) 06/29/2021 1251   BUN 23 06/29/2021 1251   BUN 30 (H) 05/27/2021 1001   CREATININE 1.75 (H) 06/29/2021 1251   CREATININE 1.60 (H) 11/27/2018 1521   CALCIUM 9.2 06/29/2021 1251   PROT 7.2 05/27/2021 1001   ALBUMIN 4.3 05/27/2021 1001   AST 16 05/27/2021 1001   ALT 15 05/27/2021 1001   ALKPHOS 86 05/27/2021 1001   BILITOT 0.3 05/27/2021 1001   GFRNONAA 33 (L) 06/29/2021 1251   GFRNONAA 35 (L) 11/27/2018 1521   GFRAA 36 (L) 02/29/2020 0000   GFRAA 40 (L) 11/27/2018 1521     Diabetic Labs (most recent): Lab Results  Component Value Date   HGBA1C 7.8 (A) 12/10/2021   HGBA1C 8.0 (H) 05/27/2021   HGBA1C 9.6 04/06/2021     Lipid Panel ( most recent) Lipid Panel     Component Value Date/Time   CHOL 94 (L) 05/27/2021 1001   TRIG 52 05/27/2021 1001   HDL 38 (L) 05/27/2021 1001   CHOLHDL 2.5 05/27/2021 1001   CHOLHDL 3.4 10/17/2020 1533   VLDL 16 10/17/2020 1533   LDLCALC 43 05/27/2021 1001        Assessment & Plan:   1) DM type 2 causing vascular disease   - Stephanie Manning has currently uncontrolled symptomatic type 2 DM since 61 years of age.  She presents today with her meter, no logs, showing worsening fasting glycemic profile over recent weeks.  Her POCT A1c today is 7.8%, increasing from last visit of 7.6%.  She has been stretching out her test strips due to cost.  She recently was bitten by a tick and has noticed that when she eats beef, she gets violent diarrhea.  Analysis of her meter shows 7-day average  of 191 (4readings); 14-day average of 195 (5readings); 30-day average of 184 (17readings); 90-day average of 169 (56readings).   - I had a long discussion with her  about the progressive nature of diabetes and the pathology behind its complications. -her diabetes is complicated by retinopathy, nephropathy, coronary artery disease and she remains at a high risk for more acute and chronic complications which include CAD, CVA, CKD, retinopathy, and neuropathy. These are all discussed in detail with her.  - Nutritional counseling repeated at each appointment due to patients tendency to fall back in to old habits.  - The patient admits there is a room for improvement in their diet and drink choices. -  Suggestion is made for the patient to avoid simple carbohydrates from their diet including Cakes, Sweet Desserts / Pastries, Ice Cream, Soda (diet and regular), Sweet Tea, Candies, Chips, Cookies, Sweet Pastries, Store Bought Juices, Alcohol in Excess of 1-2 drinks a day, Artificial Sweeteners, Coffee Creamer, and "Sugar-free" Products. This will help patient to have stable blood glucose profile and potentially avoid unintended weight gain.   - I encouraged the patient to switch to unprocessed or minimally processed complex starch and increased protein intake (animal or plant source), fruits, and vegetables.   - Patient is advised to stick to a routine mealtimes to eat 3 meals a day and avoid unnecessary snacks (to snack only to correct hypoglycemia).  - I have approached her with the following individualized plan to manage  her diabetes and patient agrees:   - she will benefit from simplification of her treatment regimen.  She will not need prandial insulin for now (has script from another provider and only takes it as needed).  -She is advised to continue Levemir 20-40 units SQ nightly and Trulicity 1.5 mg SQ every week.    -She is encouraged to continue monitoring blood glucose at least twice a day, before breakfast and before bed and report blood glucose less than 70 or above 300 for three tests in a row.  She could benefit from CGM device to prevent  hypoglycemia, she wants to discuss with her cardiologist first since she is on blood thinners.  She did not do well with SGLT2 inhibitor Wilder Glade when it was prescribed by her cardiologist as it caused significant hypotension.  - she is warned not to take insulin without proper monitoring per orders.  - she is not a candidate for metformin, SGLT2 inhibitors due to concurrent renal insufficiency.  - Patient specific target  A1c;  LDL, HDL, Triglycerides, and  Waist Circumference were discussed in detail.  2) Blood Pressure /Hypertension:  -Her blood pressure is controlled to target.  She is advised to continue Carvedilol 12.5 mg p.o. twice daily, Isosorbide mononitrate 30 mg p.o. daily, Entresto 97-103 mg p.o. twice daily, and Aldactone 25 mg p.o. daily.  3) Lipids/Hyperlipidemia:    Her most recent lipid panel from 05/27/21 shows controlled LDL at 43.  She is advised to continue Lipitor 80 mg po daily at bedtime.  Side effects and precautions discussed with her.  4)  Weight/Diet:  Her Body mass index is 31.08 kg/m.-   clearly complicating her diabetes care.  She is a candidate for continued, modest weight loss.  I discussed with her the fact that loss of 5 - 10% of her  current body weight will have the most impact on her diabetes management.  CDE Consult will be initiated . Exercise, and detailed carbohydrates information provided  -  detailed on discharge instructions.  5) Chronic Care/Health Maintenance: -she is on ACE/ARB and statin medications and is encouraged to initiate and continue to follow up with Ophthalmology, Dentist,  Podiatrist at least yearly or according to recommendations, and advised to stay away from smoking. I have recommended yearly flu vaccine and pneumonia vaccine at least every 5 years; moderate intensity exercise for up to 150 minutes weekly; and  sleep for at least 7 hours a day.  - she is advised to maintain close follow up with Stephanie Manning, Stephanie Newcomer, MD for primary care  needs, as well as her other providers for optimal and coordinated care.     I spent 41 minutes in the care of the patient today including review of labs from [place], Lipids, Thyroid Function, Hematology (current and previous including abstractions from other facilities); face-to-face time discussing  her blood glucose readings/logs, discussing hypoglycemia and hyperglycemia episodes and symptoms, medications doses, her options of short and long term treatment based on the latest standards of care / guidelines;  discussion about incorporating lifestyle medicine;  and documenting the encounter. Risk reduction counseling performed per USPSTF guidelines to reduce obesity and cardiovascular risk factors.     Please refer to Patient Instructions for Blood Glucose Monitoring and Insulin/Medications Dosing Guide"  in media tab for additional information. Please  also refer to " Patient Self Inventory" in the Media  tab for reviewed elements of pertinent patient history.  Stephanie Manning participated in the discussions, expressed understanding, and voiced agreement with the above plans.  All questions were answered to her satisfaction. she is encouraged to contact clinic should she have any questions or concerns prior to her return visit.   Follow up plan: - Return in about 4 months (around 04/11/2022) for Diabetes F/U with A1c in office, No previsit labs, Bring meter and logs.  Rayetta Pigg, [facility] [address] [place], [place] [address] Phone: [phone] Fax: [phone]   12/10/2021, 11:47 AM

## 2022-04-01 ENCOUNTER — Encounter (HOSPITAL_COMMUNITY): Payer: Medicare Other | Admitting: Cardiology

## 2022-04-13 ENCOUNTER — Ambulatory Visit: Payer: Medicare Other | Admitting: Nurse Practitioner

## 2022-04-22 ENCOUNTER — Encounter (HOSPITAL_COMMUNITY): Payer: Self-pay | Admitting: Cardiology

## 2022-04-22 ENCOUNTER — Ambulatory Visit (HOSPITAL_COMMUNITY)
Admission: RE | Admit: 2022-04-22 | Discharge: 2022-04-22 | Disposition: A | Discharge status: Home / Self Care | Payer: Medicare Other | Source: Ambulatory Visit | Attending: Cardiology | Admitting: Cardiology

## 2022-04-22 VITALS — BP 120/70 | HR 84 | Wt 209.0 lb

## 2022-04-22 DIAGNOSIS — Z7902 Long term (current) use of antithrombotics/antiplatelets: Secondary | ICD-10-CM | POA: Insufficient documentation

## 2022-04-22 DIAGNOSIS — I255 Ischemic cardiomyopathy: Secondary | ICD-10-CM | POA: Diagnosis not present

## 2022-04-22 DIAGNOSIS — Z79899 Other long term (current) drug therapy: Secondary | ICD-10-CM | POA: Diagnosis not present

## 2022-04-22 DIAGNOSIS — R42 Dizziness and giddiness: Secondary | ICD-10-CM | POA: Diagnosis not present

## 2022-04-22 DIAGNOSIS — I130 Hypertensive heart and chronic kidney disease with heart failure and stage 1 through stage 4 chronic kidney disease, or unspecified chronic kidney disease: Secondary | ICD-10-CM | POA: Diagnosis not present

## 2022-04-22 DIAGNOSIS — I25110 Atherosclerotic heart disease of native coronary artery with unstable angina pectoris: Secondary | ICD-10-CM | POA: Insufficient documentation

## 2022-04-22 DIAGNOSIS — I5022 Chronic systolic (congestive) heart failure: Secondary | ICD-10-CM | POA: Diagnosis not present

## 2022-04-22 DIAGNOSIS — I951 Orthostatic hypotension: Secondary | ICD-10-CM | POA: Insufficient documentation

## 2022-04-22 DIAGNOSIS — D509 Iron deficiency anemia, unspecified: Secondary | ICD-10-CM | POA: Diagnosis not present

## 2022-04-22 DIAGNOSIS — I252 Old myocardial infarction: Secondary | ICD-10-CM | POA: Insufficient documentation

## 2022-04-22 DIAGNOSIS — E1122 Type 2 diabetes mellitus with diabetic chronic kidney disease: Secondary | ICD-10-CM | POA: Diagnosis not present

## 2022-04-22 DIAGNOSIS — N183 Chronic kidney disease, stage 3 unspecified: Secondary | ICD-10-CM | POA: Insufficient documentation

## 2022-04-22 DIAGNOSIS — Z7982 Long term (current) use of aspirin: Secondary | ICD-10-CM | POA: Insufficient documentation

## 2022-04-22 LAB — URINALYSIS, ROUTINE W REFLEX MICROSCOPIC
Bacteria, UA: NONE SEEN
Bilirubin Urine: NEGATIVE
Glucose, UA: NEGATIVE mg/dL
Hgb urine dipstick: NEGATIVE
Ketones, ur: NEGATIVE mg/dL
Nitrite: NEGATIVE
Protein, ur: NEGATIVE mg/dL
Specific Gravity, Urine: 1.009 (ref 1.005–1.030)
pH: 5 (ref 5.0–8.0)

## 2022-04-22 LAB — BASIC METABOLIC PANEL
Anion gap: 9 (ref 5–15)
BUN: 23 mg/dL (ref 8–23)
CO2: 22 mmol/L (ref 22–32)
Calcium: 9.2 mg/dL (ref 8.9–10.3)
Chloride: 109 mmol/L (ref 98–111)
Creatinine, Ser: 1.35 mg/dL — ABNORMAL HIGH (ref 0.44–1.00)
GFR, Estimated: 44 mL/min — ABNORMAL LOW (ref >60)
Glucose, Bld: 121 mg/dL — ABNORMAL HIGH (ref 70–99)
Potassium: 4.1 mmol/L (ref 3.5–5.1)
Sodium: 140 mmol/L (ref 135–145)

## 2022-04-22 LAB — LIPID PANEL
Cholesterol: 118 mg/dL (ref 0–200)
HDL: 45 mg/dL (ref >40)
LDL Cholesterol: 61 mg/dL (ref 0–99)
Total CHOL/HDL Ratio: 2.6 RATIO
Triglycerides: 60 mg/dL (ref <150)
VLDL: 12 mg/dL (ref 0–40)

## 2022-04-22 NOTE — Patient Instructions (Signed)
There has been no changes to your medications.  Labs done today, your results will be available in MyChart, we will contact you for abnormal readings.  Your physician has requested that you have an echocardiogram. Echocardiography is a painless test that uses sound waves to create images of your heart. It provides your doctor with information about the size and shape of your heart and how well your heart's chambers and valves are working. This procedure takes approximately one hour. There are no restrictions for this procedure. Please do NOT wear cologne, perfume, aftershave, or lotions (deodorant is allowed). Please arrive 15 minutes prior to your appointment time.  Your physician recommends that you schedule a follow-up appointment in: 4 months  If you have any questions or concerns before your next appointment please send [name] a message through [place] or call our office at [phone].    TO LEAVE A MESSAGE FOR THE NURSE SELECT OPTION 2, PLEASE LEAVE A MESSAGE INCLUDING: YOUR NAME DATE OF BIRTH CALL BACK NUMBER REASON FOR CALL**this is important as we prioritize the call backs  YOU WILL RECEIVE A CALL BACK THE SAME DAY AS LONG AS YOU CALL BEFORE 4:00 PM  At the [facility], you and your health needs are our priority. As part of our continuing mission to provide you with exceptional heart care, we have created designated Provider Care Teams. These Care Teams include your primary Cardiologist (physician) and Advanced Practice Providers (APPs- Physician Assistants and Nurse Practitioners) who all work together to provide you with the care you need, when you need it.   You may see any of the following providers on your designated Care Team at your next follow up: Dr Glori Bickers Dr Loralie Champagne Dr. Roxana Hires, NP Lyda Jester, [place] [facility] [place], [place] Forestine Na, NP Audry Riles, PharmD   Please be sure to bring in all your  medications bottles to every appointment.    Thank you for choosing [facility]

## 2022-04-23 NOTE — Progress Notes (Signed)
Date:  04/23/2022   ID:  [name], DOB 1960/11/26, MRN [id number]  Provider location: [facility] Type of Visit: Established patient  PCP:  [name], MD  [facility] Cardiology: Dr. [name]   History of Present Illness: Briyelle Delcour is a 62 y.o. female who has a history of CAD and ischemic cardiomyopathy.  In 12/18, she had anterior MI with DES to totally occluded mid LAD (culprit). She returned in 1/19 for staged DES to 80% RCA stenosis. Echo in 12/18 showed EF 30-35%.  Repeat echo in 3/19 showed EF stable at 30-35%.     She was admitted in 7/19 with chest pain, concern for unstable angina.  Culprit of symptoms was thought to be 95% stenosis in a small PLV vessel, medical treatment planned.    She saw Dr. Rayann Heman and decided against ICD.   Echo was done in 6/20 showing stable EF 30-35%.    She was unable to tolerate Bidil 1/2 tablet tid due to lightheadedness. She was unable to tolerate dapagliflozin.   Echo in 11/21 showed EF 40-45% with apical septal and apical akinesis, normal RV.   Patient was admitted in 6/22 with poor po intake, feeling "sick."  She was noted to be dehydrated with hypotension, orthostasis, and AKI.  She was hydrated and Entresto and spironolactone were held.  Creatinine was as high as 3.28.   She got IV fluid and developed volume overload requiring Lasix.   Echo in 10/22 showed EF 45% with akinetic apex, normal RV.   She returns today for followup of CHF and CAD.  She is doing well, no exertional dyspnea or chest pain.  She went to the ER in 1/24 with tingling right arm that she worried could be cardiac.  Workup in the ER was unremarkable in terms of her heart, she was found to have a UTI.  She has been exercising, does a lot of walking.  No exercise limitations.  She has not had to use any Lasix. Weight down 3 lbs.   ECG (personally reviewed): NSR, inferolateral TWIs.    Labs (2/19): K 4.7, creatinine 1.48, hgb  8.4 Labs (5/19): LDL 65, K 4.7, creatinine 1.66 Labs (8/19): K 5, creatinine 1.78  Labs (9/19): K 3.8, creatinine 1.6 Labs (12/19): K 3.9, creatinine 1.59 Labs (3/20): LDL 73, HDL 41 Labs (5/20): K 4.1, creatinine 1.59, LDL 73 Labs (9/20): K 4.2, creatinine 1.6 Labs (11/20): K 4.1, creatinine 1.7, LDL 50, HDL 32 Labs (3/21): K 4.1, creatinine 1.67 Labs (8/21): LDL 76, HDL 30, K 4, creatinine 1.86 Labs (12/21): LDL 41, HDL 25, TSH normal, K 4, creatinine 1.74 Labs (6/22): creatinine 3.28 => 1.96 => 1.94, K 3.8, AST 76, ALT 65 Labs (8/22): LDL 63, K 4.1, creatinine 1.69 Labs (3/23): LDL 43, K 4.4, creatinine 1.89 Labs (6/23): Scr 1.92, K 4.4, LDL 65, Hgb A1c 7.6  Labs (1/24): K 3.7, creatinine 1.6   PMH: 1. Type II diabetes 2. CKD stage 3.  3. Hyperlipidemia 4. HTN 5. Fe deficiency anemia 6. GERD 7. CAD: Anterior MI in 12/18 with DES to 100% stenosed mid LAD (culprit), also with CTO of distal LAD, 80% pRCA, 85% mLCx/90% dLCx, 90% OM3.  - 1/19 staged PCI of proximal RCA.  - Admitted with unstable angina 7/19.  LHC: 75% stenosis proximal-mid LCx, 60% OM1, total occlusion of the distal LAD, patent mid LAD stent, patent RCA stents, 95% stenosis in small PLV branch (likely culprit).  8. Chronic systolic CHF: Ischemic cardiomyopathy.  - Echo (12/18): EF 30-35%.  - Echo (3/19): EF 30-35%, WMAs in LAD territory, RV mildly dilated.  - Echo (6/20): EF 30-35%, mild LV dilation, normal RV size and systolic function.  - Echo (11/21): EF 40-45% with apical septal and apical akinesis, normal RV. - Echo (10/22): EF 45% with akinetic apex, normal RV.   Current Outpatient Medications  Medication Sig Dispense Refill   ACCU-CHEK GUIDE test strip Check blood sugar 2 times a day before breakfast and every night before bedtime. 200 each 3   Accu-Chek Softclix Lancets lancets Use as instructed 100 each 2   aspirin 81 MG chewable tablet Chew 1 tablet (81 mg total) by mouth daily.     atorvastatin  (LIPITOR) 80 MG tablet Take 80 mg by mouth daily.     blood glucose meter kit and supplies Dispense based on patient and insurance preference. Use up to four times daily as directed. (FOR ICD-10 E11.65) Pt prefers One touch mini 1 each 5   carvedilol (COREG) 12.5 MG tablet Take 1 tablet (12.5 mg total) by mouth 2 (two) times daily with a meal. 180 tablet 3   ELDERBERRY PO Take 2 tablets by mouth every other day.      ferrous sulfate 300 (60 Fe) MG/5ML syrup Take 300 mg by mouth daily with breakfast.     furosemide (LASIX) 20 MG tablet Take 1 tablet (20 mg total) by mouth daily as needed. 90 tablet 3   gabapentin (NEURONTIN) 300 MG capsule Take 300 mg by mouth at bedtime.      insulin detemir (LEVEMIR) 100 UNIT/ML injection Inject 40 Units into the skin at bedtime.     Multiple Vitamin (MULTIVITAMIN WITH MINERALS) TABS tablet Take 1 tablet by mouth daily.     nitroGLYCERIN (NITROSTAT) 0.4 MG SL tablet Dissolve 1 tab under tongue for chest pain- if pain remains after 5 min, call 911, maxof 3 tabs in 15 minutes 25 tablet 2   NONFORMULARY OR COMPOUNDED ITEM Seamoss once daily     pantoprazole (PROTONIX) 40 MG tablet Take 1 tablet (40 mg total) by mouth 2 (two) times daily. 180 tablet 3   sacubitril-valsartan (ENTRESTO) 24-26 MG Take 1 tablet by mouth 2 (two) times daily. 60 tablet 8   Saline 0.2 % SOLN Place 1 spray into both nostrils as needed (congestion).      spironolactone (ALDACTONE) 25 MG tablet Take 1 tablet (25 mg total) by mouth daily. 90 tablet 3   ticagrelor (BRILINTA) 60 MG TABS tablet Take 1 tablet (60 mg total) by mouth 2 (two) times daily. 180 tablet 3   tiZANidine (ZANAFLEX) 4 MG tablet Take 4 mg by mouth at bedtime.     Dulaglutide (TRULICITY) 1.5 0000000 SOPN Inject 1.5 mg into the skin once a week. (Patient not taking: Reported on 04/22/2022) 6 mL 3   No current facility-administered medications for this encounter.    Allergies:   Hydralazine, Bidil [isosorb  dinitrate-hydralazine], Ciprofloxacin, Farxiga [dapagliflozin], Hydralazine hcl, Isosorbide dinitrate, Ketorolac, Nsaids, Propoxycaine, Amoxicillin, Hydrocodone, Lisinopril, Meloxicam, Strawberry extract, Tape, and Toradol [ketorolac tromethamine]   Social History:  The patient  reports that she has never smoked. She has never used smokeless tobacco. She reports that she does not drink alcohol and does not use drugs.   Family History:  The patient's family history includes Diabetes in her mother; Heart disease in her mother; Hypertension in her mother; Stroke in her mother.   ROS:  Please  see the history of present illness.   All other systems are personally reviewed and negative.   Vitals:   BP 120/70   Pulse 84   Wt 94.8 kg (209 lb)   SpO2 99%   BMI 31.32 kg/m  PHYSICAL EXAM: General: NAD Neck: No JVD, no thyromegaly or thyroid nodule.  Lungs: Clear to auscultation bilaterally with normal respiratory effort. CV: Nondisplaced PMI.  Heart regular S1/S2, no S3/S4, no murmur.  No peripheral edema.  No carotid bruit.  Normal pedal pulses.  Abdomen: Soft, nontender, no hepatosplenomegaly, no distention.  Skin: Intact without lesions or rashes.  Neurologic: Alert and oriented x 3.  Psych: Normal affect. Extremities: No clubbing or cyanosis.  HEENT: Normal.   Recent Labs: 05/27/2021: ALT 15 06/29/2021: B Natriuretic Peptide 252.6 04/22/2022: BUN 23; Creatinine, Ser 1.35; Potassium 4.1; Sodium 140  Personally reviewed    Wt Readings from Last 3 Encounters:  04/22/22 94.8 kg (209 lb)  12/10/21 94.1 kg (207 lb 6.4 oz)  12/01/21 93.4 kg (205 lb 12.8 oz)    ASSESSMENT AND PLAN:  1. CAD: Anterior MI in 12/18 with DES to proximal LAD (culprit) followed by staged DES to proximal RCA.  7/19 admission with unstable angina, likely culprit was 95% stenosis in small PLV. This was managed conservatively.  No chest pain.   - Continue atorvastatin, check lipids today.   - Continue ASA 81 daily and  ticagrelor 60 mg bid.  - Continue Coreg 12.5 mg bid  2. Chronic systolic CHF: Ischemic cardiomyopathy. Echo in 10/22 showed EF improved to 45%.  NYHA class I-II symptoms, not volume overloaded.  - Continue Coreg 12.5 mg bid (previously did not tolerate higher dose)  - Continue Entresto 24/26 bid, she did not tolerate higher dose in past with lightheadedness/hypotension.   - Continue spironolactone 25 mg daily.   - She was unable to tolerate Bidil at lowest does due to lightheadedness.   - She was unable to tolerate dapagliflozin.   - Continue Lasix PRN  - Last echo was out of ICD range. When EF was lower, she refused ICD.  I will arrange for repeat echo.  3. CKD: stage 3.  She did not tolerate SLGT2i.  - BMET today.  4. Fe deficiency anemia: Long-standing.  She has had IV Fe infusions. Now followed by PCP.  5. Type II DM: Managed by PCP  - Unable to tolerate dapagliflozin.   Followup 4 months with APP.   Signed, Loralie Champagne, MD  04/23/2022  [facility] [address] [facility] [place] [address] [phone] (office) [phone] (fax)

## 2022-04-27 ENCOUNTER — Telehealth (HOSPITAL_COMMUNITY): Payer: Self-pay

## 2022-04-27 NOTE — Telephone Encounter (Signed)
Patient's daughter called and stated patient is having chest pain. I called patient directly and she states she thinks it is gas because it is easing up. I told her with chest pain of any kind it is better to go to the ED to be checked out and advised her to do so. She said she would monitor it and if it gets worse again she will go.

## 2022-04-30 ENCOUNTER — Other Ambulatory Visit (HOSPITAL_COMMUNITY): Payer: Self-pay

## 2022-04-30 MED ORDER — SPIRONOLACTONE 25 MG PO TABS: 25.0000 mg | ORAL_TABLET | Freq: Every day | ORAL | 3 refills | Status: DC

## 2022-05-10 ENCOUNTER — Ambulatory Visit: Payer: Medicare Other | Admitting: Nurse Practitioner

## 2022-05-10 ENCOUNTER — Telehealth: Payer: Self-pay | Admitting: "Endocrinology

## 2022-05-10 DIAGNOSIS — E1122 Type 2 diabetes mellitus with diabetic chronic kidney disease: Secondary | ICD-10-CM

## 2022-05-10 DIAGNOSIS — E782 Mixed hyperlipidemia: Secondary | ICD-10-CM

## 2022-05-10 DIAGNOSIS — I10 Essential (primary) hypertension: Secondary | ICD-10-CM

## 2022-05-10 NOTE — Telephone Encounter (Signed)
PLEASE DISREGARD

## 2022-05-10 NOTE — Telephone Encounter (Signed)
Pt needs a DEXA. He has a f/u with Dr Dorris Fetch in 3 weeks

## 2022-05-17 ENCOUNTER — Ambulatory Visit (HOSPITAL_COMMUNITY)
Admission: RE | Admit: 2022-05-17 | Discharge: 2022-05-17 | Disposition: A | Discharge status: Home / Self Care | Payer: Medicare Other | Source: Ambulatory Visit | Attending: Family Medicine | Admitting: Family Medicine

## 2022-05-17 ENCOUNTER — Telehealth (HOSPITAL_COMMUNITY): Payer: Self-pay

## 2022-05-17 DIAGNOSIS — E119 Type 2 diabetes mellitus without complications: Secondary | ICD-10-CM | POA: Insufficient documentation

## 2022-05-17 DIAGNOSIS — I110 Hypertensive heart disease with heart failure: Secondary | ICD-10-CM | POA: Diagnosis present

## 2022-05-17 DIAGNOSIS — I081 Rheumatic disorders of both mitral and tricuspid valves: Secondary | ICD-10-CM | POA: Diagnosis not present

## 2022-05-17 DIAGNOSIS — I509 Heart failure, unspecified: Secondary | ICD-10-CM | POA: Insufficient documentation

## 2022-05-17 DIAGNOSIS — I219 Acute myocardial infarction, unspecified: Secondary | ICD-10-CM | POA: Diagnosis not present

## 2022-05-17 DIAGNOSIS — I5022 Chronic systolic (congestive) heart failure: Secondary | ICD-10-CM

## 2022-05-17 DIAGNOSIS — K219 Gastro-esophageal reflux disease without esophagitis: Secondary | ICD-10-CM | POA: Insufficient documentation

## 2022-05-17 DIAGNOSIS — I2510 Atherosclerotic heart disease of native coronary artery without angina pectoris: Secondary | ICD-10-CM | POA: Insufficient documentation

## 2022-05-17 DIAGNOSIS — E785 Hyperlipidemia, unspecified: Secondary | ICD-10-CM | POA: Diagnosis not present

## 2022-05-17 LAB — ECHOCARDIOGRAM COMPLETE
Area-P 1/2: 5.02 cm2
Calc EF: 45.7 %
Est EF: 45
S' Lateral: 3.8 cm
Single Plane A2C EF: 41.5 %
Single Plane A4C EF: 49.7 %

## 2022-05-17 NOTE — Telephone Encounter (Signed)
Patient aware of echo results.

## 2022-05-17 NOTE — Progress Notes (Signed)
Echocardiogram 2D Echocardiogram has been performed.  Stephanie Manning 05/17/2022, 12:06 PM

## 2022-06-08 ENCOUNTER — Ambulatory Visit (INDEPENDENT_AMBULATORY_CARE_PROVIDER_SITE_OTHER): Payer: Medicare Other | Admitting: Nurse Practitioner

## 2022-06-08 ENCOUNTER — Encounter: Payer: Self-pay | Admitting: Nurse Practitioner

## 2022-06-08 VITALS — BP 142/80 | HR 59 | Ht 68.5 in | Wt 215.6 lb

## 2022-06-08 DIAGNOSIS — N1832 Chronic kidney disease, stage 3b: Secondary | ICD-10-CM | POA: Diagnosis not present

## 2022-06-08 DIAGNOSIS — Z794 Long term (current) use of insulin: Secondary | ICD-10-CM

## 2022-06-08 DIAGNOSIS — E782 Mixed hyperlipidemia: Secondary | ICD-10-CM

## 2022-06-08 DIAGNOSIS — E1122 Type 2 diabetes mellitus with diabetic chronic kidney disease: Secondary | ICD-10-CM

## 2022-06-08 MED ORDER — TRULICITY 1.5 MG/0.5ML ~~LOC~~ SOAJ: 1.5000 mg | SUBCUTANEOUS | 3 refills | Status: AC

## 2022-06-08 NOTE — Progress Notes (Addendum)
06/08/2022, 12:17 PM  Endocrinology Follow Up Visit   Subjective:    Patient ID: Stephanie Manning, female    DOB: 03/17/1960.  Stephanie Manning is being seen in follow-up  for management of currently uncontrolled symptomatic diabetes requested by  [name], MD.   Past Medical History:  Diagnosis Date   AKI (acute kidney injury) ([place]) 02/2017   CAD in native artery    a. NSTEMI 02/2017 s/p DES to mLAD, diffuse disease otherwise, EF 30-35%. b. Staged cath 03/2017 - patent stent in the proximal to mid LAD. CTO of the distal LAD, 90% diffuse small first diagonal, 90% distal LCx/OM, 80% diffuse prox RCA, moderately elevated LVEDP 51mmHg, s/p DES to mRCA, residual disease treated medically (too small for PCI), EF 30-35%.   Chronic combined systolic and diastolic CHF (congestive heart failure) ([place])    CKD (chronic kidney disease), stage IV ([place])    [name] 04/21/2017   Diabetes mellitus, type II, insulin dependent (HCC)    GERD (gastroesophageal reflux disease)    History of blood transfusion    "related to menses"   History of stomach ulcers 2017   Hyperlipidemia    Hypertension    Iron deficiency anemia 03/2017   "had to have iron infusions" (10/10/2017)   Ischemic cardiomyopathy    STEMI (ST elevation myocardial infarction) ([place]) 02/2017    Past Surgical History:  Procedure Laterality Date   CESAREAN SECTION  1987   CORONARY ANGIOPLASTY WITH STENT PLACEMENT  04/01/2017   drug-eluting stent was successfully placed using a STENT SYNERGY DES [id number]   CORONARY STENT INTERVENTION N/A 04/01/2017   Procedure: CORONARY STENT INTERVENTION;  Surgeon: [name], [name], MD;  Location: [facility];  Service: Cardiovascular;  Laterality: N/A;   CORONARY/GRAFT ACUTE MI REVASCULARIZATION N/A 03/10/2017   Procedure: Coronary/Graft Acute MI Revascularization;  Surgeon: [name], [name], MD;   Location: [facility];  Service: Cardiovascular;  Laterality: N/A;   LEFT HEART CATH AND CORONARY ANGIOGRAPHY N/A 03/10/2017   Procedure: LEFT HEART CATH AND CORONARY ANGIOGRAPHY;  Surgeon: [name], [name], MD;  Location: [facility];  Service: Cardiovascular;  Laterality: N/A;   LEFT HEART CATH AND CORONARY ANGIOGRAPHY N/A 04/01/2017   Procedure: LEFT HEART CATH AND CORONARY ANGIOGRAPHY;  Surgeon: [name], [name], MD;  Location: [facility];  Service: Cardiovascular;  Laterality: N/A;   LEFT HEART CATH AND CORONARY ANGIOGRAPHY N/A 10/12/2017   Procedure: LEFT HEART CATH AND CORONARY ANGIOGRAPHY;  Surgeon: Sherren Mocha, MD;  Location: [facility];  Service: Cardiovascular;  Laterality: N/A;   OOPHORECTOMY     "1 ovary removed"   THROAT SURGERY  02/2014   "nodule in my throat removed; not related to thyroid"   TOOTH EXTRACTION  07/2021    Social History   Socioeconomic History   Marital status: Widowed    Spouse name: Not on file   Number of children: 2   Years of education: Not on file   Highest education level: Not on file  Occupational History   Not on file  Tobacco Use  Smoking status: Never   Smokeless tobacco: Never  Vaping Use   Vaping Use: Never used  Substance and Sexual Activity   Alcohol use: Never   Drug use: Never   Sexual activity: Yes  Other Topics Concern   Not on file  Social History Narrative   Not on file   Social Determinants of Health   Financial Resource Strain: Not on file  Food Insecurity: Not on file  Transportation Needs: Not on file  Physical Activity: Not on file  Stress: Not on file  Social Connections: Not on file    Family History  Problem Relation Age of Onset   Hypertension Mother    Heart disease Mother    Diabetes Mother    Stroke Mother     Outpatient Encounter Medications as of 06/08/2022  Medication Sig   ACCU-CHEK GUIDE test strip Check blood sugar 2 times a day before breakfast and every  night before bedtime.   Accu-Chek Softclix Lancets lancets Use as instructed   aspirin 81 MG chewable tablet Chew 1 tablet (81 mg total) by mouth daily.   atorvastatin (LIPITOR) 80 MG tablet Take 80 mg by mouth daily.   blood glucose meter kit and supplies Dispense based on patient and insurance preference. Use up to four times daily as directed. (FOR ICD-10 E11.65) Pt prefers One touch mini   carvedilol (COREG) 12.5 MG tablet Take 1 tablet (12.5 mg total) by mouth 2 (two) times daily with a meal.   ELDERBERRY PO Take 2 tablets by mouth every other day.    ferrous sulfate 300 (60 Fe) MG/5ML syrup Take 300 mg by mouth daily with breakfast.   furosemide (LASIX) 20 MG tablet Take 1 tablet (20 mg total) by mouth daily as needed.   gabapentin (NEURONTIN) 300 MG capsule Take 300 mg by mouth at bedtime.    insulin detemir (LEVEMIR) 100 UNIT/ML injection Inject 40 Units into the skin at bedtime.   Multiple Vitamin (MULTIVITAMIN WITH MINERALS) TABS tablet Take 1 tablet by mouth daily.   nitroGLYCERIN (NITROSTAT) 0.4 MG SL tablet Dissolve 1 tab under tongue for chest pain- if pain remains after 5 min, call 911, maxof 3 tabs in 15 minutes   NONFORMULARY OR COMPOUNDED ITEM Seamoss once daily   pantoprazole (PROTONIX) 40 MG tablet Take 1 tablet (40 mg total) by mouth 2 (two) times daily.   sacubitril-valsartan (ENTRESTO) 24-26 MG Take 1 tablet by mouth 2 (two) times daily.   Saline 0.2 % SOLN Place 1 spray into both nostrils as needed (congestion).    spironolactone (ALDACTONE) 25 MG tablet Take 1 tablet (25 mg total) by mouth daily.   ticagrelor (BRILINTA) 60 MG TABS tablet Take 1 tablet (60 mg total) by mouth 2 (two) times daily.   tiZANidine (ZANAFLEX) 4 MG tablet Take 4 mg by mouth at bedtime.   [DISCONTINUED] Dulaglutide (TRULICITY) 1.5 0000000 SOPN Inject 1.5 mg into the skin once a week.   Dulaglutide (TRULICITY) 1.5 0000000 SOPN Inject 1.5 mg into the skin once a week.   No facility-administered  encounter medications on file as of 06/08/2022.    ALLERGIES: Allergies  Allergen Reactions   Hydralazine Other (See Comments)    Syncope due to hypotension   Bidil [Isosorb Dinitrate-Hydralazine]    Ciprofloxacin    Farxiga [Dapagliflozin]    Hydralazine Hcl    Isosorbide Dinitrate    Ketorolac    Nsaids    Propoxycaine    Amoxicillin Other (See Comments)    Yeast  infection during treatment   Hydrocodone Palpitations, Rash and Other (See Comments)    GI upset, also   Lisinopril Palpitations and Other (See Comments)    Chest pain, too   Meloxicam Nausea Only   Strawberry Extract Hives    Does not always happen   Tape Rash    Some tapes cause rashes   Toradol [Ketorolac Tromethamine] Palpitations    VACCINATION STATUS:  There is no immunization history on file for this patient.  Diabetes She presents for her follow-up diabetic visit. She has type 2 diabetes mellitus. Onset time: She was diagnosed at approximate age of 27 years. Her disease course has been stable. Hypoglycemia symptoms include sweats and tremors. Pertinent negatives for hypoglycemia include no confusion, headaches, pallor or seizures. Pertinent negatives for diabetes include no chest pain, no fatigue, no polydipsia, no polyphagia and no polyuria. There are no hypoglycemic complications. (Had several episodes of hypoglycemia as a result of working outside) Symptoms are stable. Diabetic complications include heart disease, nephropathy, peripheral neuropathy and retinopathy. Risk factors for coronary artery disease include diabetes mellitus, dyslipidemia, hypertension, obesity, sedentary lifestyle and post-menopausal. Current diabetic treatment includes insulin injections (and Trulicity every other week). She is compliant with treatment most of the time. Her weight is fluctuating minimally. She is following a generally healthy diet. When asked about meal planning, she reported none. She has not had a previous visit with  a dietitian. She never participates in exercise. Her home blood glucose trend is decreasing steadily. Her overall blood glucose range is 140-180 mg/dl. (She presents today with her meter, no logs, showing improving glycemic profile.  Her most recent A1c on 03/19/22 was 8%, increasing from last visit of 7.8%.  She notes she continues to take her Trulicity every other week due to constipation and has been taking 35-40 units of Levemir depending on her glucose readings.  Analysis of her meter shows 7-day average of 146 with 6 readings; 14-day average of 154 with 17 readings; 30-day average of 154 with 28 readings; and 90-day average of 161 with 68 readings.) An ACE inhibitor/angiotensin II receptor blocker is being taken. She does not see a podiatrist.Eye exam is current.  Hyperlipidemia This is a chronic problem. The current episode started more than 1 year ago. The problem is controlled. Recent lipid tests were reviewed and are normal. Exacerbating diseases include chronic renal disease, diabetes and obesity. Factors aggravating her hyperlipidemia include fatty foods and beta blockers. Pertinent negatives include no chest pain, myalgias or shortness of breath. Current antihyperlipidemic treatment includes statins. The current treatment provides mild improvement of lipids. Compliance problems include adherence to diet and adherence to exercise.  Risk factors for coronary artery disease include diabetes mellitus, dyslipidemia, hypertension, obesity, post-menopausal and family history.  Hypertension This is a chronic problem. The current episode started more than 1 year ago. The problem has been gradually improving since onset. The problem is controlled. Associated symptoms include sweats. Pertinent negatives include no chest pain, headaches, palpitations or shortness of breath. There are no associated agents to hypertension. Risk factors for coronary artery disease include diabetes mellitus, dyslipidemia, obesity,  sedentary lifestyle and post-menopausal state. Past treatments include beta blockers, diuretics and angiotensin blockers. The current treatment provides moderate improvement. Compliance problems include diet and exercise.  Hypertensive end-organ damage includes kidney disease, CAD/MI and retinopathy. Identifiable causes of hypertension include chronic renal disease.    Review of systems  Constitutional: + Minimally fluctuating body weight,  current Body mass index is 32.31 kg/m. ,  no fatigue, no subjective hyperthermia, no subjective hypothermia Eyes: no blurry vision, no xerophthalmia ENT: no sore throat, no nodules palpated in throat, no dysphagia/odynophagia, no hoarseness Cardiovascular: no chest pain, no shortness of breath, no palpitations, no leg swelling Respiratory: no cough, no shortness of breath Gastrointestinal: no nausea/vomiting/diarrhea Musculoskeletal: no muscle/joint aches Skin: no rashes, no hyperemia Neurological: no tremors, no numbness, no tingling, no dizziness Psychiatric: no depression, no anxiety   Objective:    BP (!) 142/80 (BP Location: Right Arm, Patient Position: Sitting, Cuff Size: Normal) Comment: Retake manuel cuff  Pulse (!) 59   Ht 5' 8.5" (1.74 m)   Wt 215 lb 9.6 oz (97.8 kg)   BMI 32.31 kg/m   Wt Readings from Last 3 Encounters:  06/08/22 215 lb 9.6 oz (97.8 kg)  04/22/22 209 lb (94.8 kg)  12/10/21 207 lb 6.4 oz (94.1 kg)    BP Readings from Last 3 Encounters:  06/08/22 (!) 142/80  04/22/22 120/70  12/10/21 135/78     Physical Exam- Limited  Constitutional:  Body mass index is 32.31 kg/m. , not in acute distress, normal state of mind Eyes:  EOMI, no exophthalmos Musculoskeletal: no gross deformities, strength intact in all four extremities, no gross restriction of joint movements Skin:  no rashes, no hyperemia Neurological: no tremor with outstretched hands   Diabetic Foot Exam - Simple   No data filed      CMP      Component Value Date/Time   NA 140 04/22/2022 1218   NA 139 05/27/2021 1001   K 4.1 04/22/2022 1218   CL 109 04/22/2022 1218   CO2 22 04/22/2022 1218   GLUCOSE 121 (H) 04/22/2022 1218   BUN 23 04/22/2022 1218   BUN 30 (H) 05/27/2021 1001   CREATININE 1.35 (H) 04/22/2022 1218   CREATININE 1.60 (H) 11/27/2018 1521   CALCIUM 9.2 04/22/2022 1218   PROT 7.2 05/27/2021 1001   ALBUMIN 4.3 05/27/2021 1001   AST 16 05/27/2021 1001   ALT 15 05/27/2021 1001   ALKPHOS 86 05/27/2021 1001   BILITOT 0.3 05/27/2021 1001   GFRNONAA 44 (L) 04/22/2022 1218   GFRNONAA 35 (L) 11/27/2018 1521   GFRAA 36 (L) 02/29/2020 0000   GFRAA 40 (L) 11/27/2018 1521     Diabetic Labs (most recent): Lab Results  Component Value Date   HGBA1C 7.8 (A) 12/10/2021   HGBA1C 8.0 (H) 05/27/2021   HGBA1C 9.6 04/06/2021     Lipid Panel ( most recent) Lipid Panel     Component Value Date/Time   CHOL 118 04/22/2022 1218   CHOL 94 (L) 05/27/2021 1001   TRIG 60 04/22/2022 1218   HDL 45 04/22/2022 1218   HDL 38 (L) 05/27/2021 1001   CHOLHDL 2.6 04/22/2022 1218   VLDL 12 04/22/2022 1218   LDLCALC 61 04/22/2022 1218   LDLCALC 43 05/27/2021 1001        Assessment & Plan:   1) DM type 2 causing vascular disease   - Markesia Kozal has currently uncontrolled symptomatic type 2 DM since 62 years of age.  She presents today with her meter, no logs, showing improving glycemic profile.  Her most recent A1c on 03/19/22 was 8%, increasing from last visit of 7.8%.  She notes she continues to take her Trulicity every other week due to constipation and has been taking 35-40 units of Levemir depending on her glucose readings.  Analysis of her meter shows 7-day average of 146 with 6 readings; 14-day  average of 154 with 17 readings; 30-day average of 154 with 28 readings; and 90-day average of 161 with 68 readings.  - I had a long discussion with her about the progressive nature of diabetes and the pathology behind its  complications. -her diabetes is complicated by retinopathy, nephropathy, coronary artery disease and she remains at a high risk for more acute and chronic complications which include CAD, CVA, CKD, retinopathy, and neuropathy. These are all discussed in detail with her.  - Nutritional counseling repeated at each appointment due to patients tendency to fall back in to old habits.  - The patient admits there is a room for improvement in their diet and drink choices. -  Suggestion is made for the patient to avoid simple carbohydrates from their diet including Cakes, Sweet Desserts / Pastries, Ice Cream, Soda (diet and regular), Sweet Tea, Candies, Chips, Cookies, Sweet Pastries, Store Bought Juices, Alcohol in Excess of 1-2 drinks a day, Artificial Sweeteners, Coffee Creamer, and "Sugar-free" Products. This will help patient to have stable blood glucose profile and potentially avoid unintended weight gain.   - I encouraged the patient to switch to unprocessed or minimally processed complex starch and increased protein intake (animal or plant source), fruits, and vegetables.   - Patient is advised to stick to a routine mealtimes to eat 3 meals a day and avoid unnecessary snacks (to snack only to correct hypoglycemia).  - I have approached her with the following individualized plan to manage  her diabetes and patient agrees:   -She is advised to continue Levemir 20-40 units SQ nightly (will try switching to [place] as Levemir is no longer being manufactured- sample provided to see if she tolerates it well given her drug sensitivities) and Trulicity 1.5 mg SQ every other week.  I did encourage her to keep a close eye on her glucose readings now that the weather is warmer and she is outside more as she may be more at risk for lows.  -She is encouraged to continue monitoring blood glucose at least twice a day, before breakfast and before bed and report blood glucose less than 70 or above 300 for three tests  in a row.  She could benefit from CGM device to prevent hypoglycemia, however she does have sensitivity to some tapes and is afraid the sensors would not stick given her active lifestyle.  She did not do well with SGLT2 inhibitor Wilder Glade when it was prescribed by her cardiologist as it caused significant hypotension.  - she is warned not to take insulin without proper monitoring per orders.  - she is not a candidate for metformin, SGLT2 inhibitors due to concurrent renal insufficiency.  - Patient specific target  A1c;  LDL, HDL, Triglycerides, and  Waist Circumference were discussed in detail.  2) Blood Pressure /Hypertension:  -Her blood pressure is controlled to target.  She is advised to continue Carvedilol 12.5 mg p.o. twice daily, Isosorbide mononitrate 30 mg p.o. daily, Entresto 97-103 mg p.o. twice daily, Lasix 20 mg po daily as needed, and Aldactone 25 mg p.o. daily.  3) Lipids/Hyperlipidemia:    Her most recent lipid panel from 03/19/22 shows controlled LDL at 52.  She is advised to continue Lipitor 80 mg po daily at bedtime.  Side effects and precautions discussed with her.  4)  Weight/Diet:  Her Body mass index is 32.31 kg/m.-   clearly complicating her diabetes care.  She is a candidate for continued, modest weight loss.  I discussed with her the  fact that loss of 5 - 10% of her  current body weight will have the most impact on her diabetes management.  CDE Consult will be initiated . Exercise, and detailed carbohydrates information provided  -  detailed on discharge instructions.  5) Chronic Care/Health Maintenance: -she is on ACE/ARB and statin medications and is encouraged to initiate and continue to follow up with Ophthalmology, Dentist,  Podiatrist at least yearly or according to recommendations, and advised to stay away from smoking. I have recommended yearly flu vaccine and pneumonia vaccine at least every 5 years; moderate intensity exercise for up to 150 minutes weekly; and   sleep for at least 7 hours a day.  - she is advised to maintain close follow up with Margo Common, Jarvis Newcomer, MD for primary care needs, as well as her other providers for optimal and coordinated care.     I spent  48  minutes in the care of the patient today including review of labs from [facility], Lipids, Thyroid Function, Hematology (current and previous including abstractions from other facilities); face-to-face time discussing  her blood glucose readings/logs, discussing hypoglycemia and hyperglycemia episodes and symptoms, medications doses, her options of short and long term treatment based on the latest standards of care / guidelines;  discussion about incorporating lifestyle medicine;  and documenting the encounter. Risk reduction counseling performed per USPSTF guidelines to reduce obesity and cardiovascular risk factors.     Please refer to Patient Instructions for Blood Glucose Monitoring and Insulin/Medications Dosing Guide"  in media tab for additional information. Please  also refer to " Patient Self Inventory" in the Media  tab for reviewed elements of pertinent patient history.  [name] participated in the discussions, expressed understanding, and voiced agreement with the above plans.  All questions were answered to her satisfaction. she is encouraged to contact clinic should she have any questions or concerns prior to her return visit.   Follow up plan: - Return in about 4 months (around 10/08/2022) for Diabetes F/U with A1c in office, No previsit labs, Bring meter and logs.  Rayetta Pigg, [facility] [address] [place], [place] [address] Phone: [phone] Fax: [phone]   06/08/2022, 12:17 PM

## 2022-08-12 NOTE — Progress Notes (Addendum)
Date:  08/16/2022   ID:  Stephanie Manning, DOB 31-Jan-1961, MRN [id number]  Provider location: [facility] Type of Visit: Established patient  PCP:  Virl Diamond, MD  HF Cardiology: Dr. Shirlee Latch   History of Present Illness: Stephanie Manning is a 62 y.o. female who has a history of CAD and ischemic cardiomyopathy.  In 12/18, she had anterior MI with DES to totally occluded mid LAD (culprit). She returned in 1/19 for staged DES to 80% RCA stenosis. Echo in 12/18 showed EF 30-35%.  Repeat echo in 3/19 showed EF stable at 30-35%.     She was admitted in 7/19 with chest pain, concern for unstable angina.  Culprit of symptoms was thought to be 95% stenosis in a small PLV vessel, medical treatment planned.    She saw Dr. Johney Frame and decided against ICD.   Echo was done in 6/20 showing stable EF 30-35%.    She was unable to tolerate Bidil 1/2 tablet tid due to lightheadedness. She was unable to tolerate dapagliflozin.   Echo in 11/21 showed EF 40-45% with apical septal and apical akinesis, normal RV.   Patient was admitted in 6/22 with poor po intake, feeling "sick."  She was noted to be dehydrated with hypotension, orthostasis, and AKI.  She was hydrated and Entresto and spironolactone were held.  Creatinine was as high as 3.28.   She got IV fluid and developed volume overload requiring Lasix.   Echo in 10/22 showed EF 45% with akinetic apex, normal RV.   Follow up 2/24, NYHA I and volume stable. Echo 3/24 showed EF 45%.  Today she returns for HF follow up with her friend. Overall feeling fine. She is not SOB working in her yard, moving rocks and gardening. Denies palpitations, CP, dizziness, edema, or PND/Orthopnea. Appetite ok. No fever or chills. Weight at home 212 pounds. Taking all medications. BP at home 120-150s/80s-90s. Has not needed Lasix.  ECG (personally reviewed): none ordered.   Labs (2/19): K 4.7, creatinine 1.48, hgb 8.4 Labs (5/19): LDL  65, K 4.7, creatinine 1.66 Labs (8/19): K 5, creatinine 1.78  Labs (9/19): K 3.8, creatinine 1.6 Labs (12/19): K 3.9, creatinine 1.59 Labs (3/20): LDL 73, HDL 41 Labs (5/20): K 4.1, creatinine 1.59, LDL 73 Labs (9/20): K 4.2, creatinine 1.6 Labs (11/20): K 4.1, creatinine 1.7, LDL 50, HDL 32 Labs (3/21): K 4.1, creatinine 1.67 Labs (8/21): LDL 76, HDL 30, K 4, creatinine 1.86 Labs (12/21): LDL 41, HDL 25, TSH normal, K 4, creatinine 1.74 Labs (6/22): creatinine 3.28 => 1.96 => 1.94, K 3.8, AST 76, ALT 65 Labs (8/22): LDL 63, K 4.1, creatinine 7.82 Labs (3/23): LDL 43, K 4.4, creatinine 9.56 Labs (6/23): Scr 1.92, K 4.4, LDL 65, Hgb A1c 7.6  Labs (1/24): K 3.7, creatinine 1.6 Labs (2/24): K 4.1, creatinine 1.35, LDL 61   PMH: 1. Type II diabetes 2. CKD stage 3.  3. Hyperlipidemia 4. HTN 5. Fe deficiency anemia 6. GERD 7. CAD: Anterior MI in 12/18 with DES to 100% stenosed mid LAD (culprit), also with CTO of distal LAD, 80% pRCA, 85% mLCx/90% dLCx, 90% OM3.  - 1/19 staged PCI of proximal RCA.  - Admitted with unstable angina 7/19.  LHC: 75% stenosis proximal-mid LCx, 60% OM1, total occlusion of the distal LAD, patent mid LAD stent, patent RCA stents, 95% stenosis in small PLV branch (likely culprit).  8. Chronic systolic CHF: Ischemic cardiomyopathy.  - Echo (12/18):  EF 30-35%.  - Echo (3/19): EF 30-35%, WMAs in LAD territory, RV mildly dilated.  - Echo (6/20): EF 30-35%, mild LV dilation, normal RV size and systolic function.  - Echo (11/21): EF 40-45% with apical septal and apical akinesis, normal RV. - Echo (10/22): EF 45% with akinetic apex, normal RV.  - Echo (3/24): EF 45%, mid-to-apical inferoseptal, apical inferior, apical anterior and apex are akinetic, normal RV   Current Outpatient Medications  Medication Sig Dispense Refill   ACCU-CHEK GUIDE test strip Check blood sugar 2 times a day before breakfast and every night before bedtime. 200 each 3   Accu-Chek Softclix  Lancets lancets Use as instructed 100 each 2   aspirin 81 MG chewable tablet Chew 1 tablet (81 mg total) by mouth daily.     atorvastatin (LIPITOR) 80 MG tablet Take 80 mg by mouth daily.     blood glucose meter kit and supplies Dispense based on patient and insurance preference. Use up to four times daily as directed. (FOR ICD-10 E11.65) Pt prefers One touch mini 1 each 5   carvedilol (COREG) 12.5 MG tablet Take 1 tablet (12.5 mg total) by mouth 2 (two) times daily with a meal. 180 tablet 3   Dulaglutide (TRULICITY) 1.5 MG/0.5ML SOPN Inject 1.5 mg into the skin once a week. 6 mL 3   ELDERBERRY PO Take 2 tablets by mouth every other day.      ferrous sulfate 300 (60 Fe) MG/5ML syrup Take 300 mg by mouth daily with breakfast.     furosemide (LASIX) 20 MG tablet Take 1 tablet (20 mg total) by mouth daily as needed. 90 tablet 3   gabapentin (NEURONTIN) 300 MG capsule Take 300 mg by mouth at bedtime.      insulin detemir (LEVEMIR) 100 UNIT/ML injection Inject 40 Units into the skin at bedtime.     Multiple Vitamin (MULTIVITAMIN WITH MINERALS) TABS tablet Take 1 tablet by mouth daily.     nitroGLYCERIN (NITROSTAT) 0.4 MG SL tablet Dissolve 1 tab under tongue for chest pain- if pain remains after 5 min, call 911, maxof 3 tabs in 15 minutes 25 tablet 2   NONFORMULARY OR COMPOUNDED ITEM Seamoss once daily as needed     pantoprazole (PROTONIX) 40 MG tablet Take 1 tablet (40 mg total) by mouth 2 (two) times daily. 180 tablet 3   sacubitril-valsartan (ENTRESTO) 24-26 MG Take 1 tablet by mouth 2 (two) times daily. 60 tablet 8   Saline 0.2 % SOLN Place 1 spray into both nostrils as needed (congestion).      spironolactone (ALDACTONE) 25 MG tablet Take 1 tablet (25 mg total) by mouth daily. 90 tablet 3   ticagrelor (BRILINTA) 60 MG TABS tablet Take 1 tablet (60 mg total) by mouth 2 (two) times daily. 180 tablet 3   tiZANidine (ZANAFLEX) 4 MG tablet Take 4 mg by mouth at bedtime.     No current  facility-administered medications for this encounter.    Allergies:   Hydralazine, Bidil [isosorb dinitrate-hydralazine], Ciprofloxacin, Farxiga [dapagliflozin], Hydralazine hcl, Isosorbide dinitrate, Ketorolac, Nsaids, Propoxycaine, Amoxicillin, Hydrocodone, Lisinopril, Meloxicam, Strawberry extract, Tape, and Toradol [ketorolac tromethamine]   Social History:  The patient  reports that she has never smoked. She has never used smokeless tobacco. She reports that she does not drink alcohol and does not use drugs.   Family History:  The patient's family history includes Diabetes in her mother; Heart disease in her mother; Hypertension in her mother; Stroke in her mother.  ROS:  Please see the history of present illness.   All other systems are personally reviewed and negative.   Vitals:   BP (!) 150/78 (BP Location: Left Arm, Patient Position: Sitting)   Pulse 77   Wt 96.2 kg (212 lb)   SpO2 100%   BMI 31.77 kg/m   Wt Readings from Last 3 Encounters:  08/16/22 96.2 kg (212 lb)  06/08/22 97.8 kg (215 lb 9.6 oz)  04/22/22 94.8 kg (209 lb)   PHYSICAL EXAM: General:  NAD. No resp difficulty HEENT: Normal Neck: Supple. No JVD. Carotids 2+ bilat; no bruits. No lymphadenopathy or thryomegaly appreciated. Cor: PMI nondisplaced. Regular rate & rhythm. No rubs, gallops or murmurs. Lungs: Clear Abdomen: Soft, nontender, nondistended. No hepatosplenomegaly. No bruits or masses. Good bowel sounds. Extremities: No cyanosis, clubbing, rash, edema Neuro: Alert & oriented x 3, cranial nerves grossly intact. Moves all 4 extremities w/o difficulty. Affect pleasant.  Recent Labs: 04/22/2022: BUN 23; Creatinine, Ser 1.35; Potassium 4.1; Sodium 140  Personally reviewed   ASSESSMENT AND PLAN: 1. CAD: Anterior MI in 12/18 with DES to proximal LAD (culprit) followed by staged DES to proximal RCA.  7/19 admission with unstable angina, likely culprit was 95% stenosis in small PLV. This was managed  conservatively.  No chest pain.   - Continue atorvastatin, good lipids (2/24) - Continue ASA 81 daily and ticagrelor 60 mg bid long-term.  - Continue Coreg 12.5 mg bid  2. Chronic systolic CHF: Ischemic cardiomyopathy. Echo in 10/22 showed EF improved to 45%. Echo (2/24) EF stable at 45%, normal RV. NYHA class I-II symptoms, not volume overloaded.  - Continue Coreg 12.5 mg bid (previously did not tolerate higher dose)  - Continue Entresto 24/26 bid, (lightheadedness/hypotension on higher dose).   - Continue spironolactone 25 mg daily.   - Continue Lasix 20 PRN  - She was unable to tolerate Bidil at lowest does due to lightheadedness.   - She was unable to tolerate dapagliflozin.   - Last echo was out of ICD range. When EF was lower, she refused ICD.  3. CKD: stage 3. She did not tolerate SLGT2i.  - BMET today.  4. Fe deficiency anemia: Long-standing.  She has had IV Fe infusions. Now followed by PCP.  5. Type II DM: Managed by PCP. - Unable to tolerate dapagliflozin.   Followup 4 months with Dr. Shirlee Latch.  Signed, Jacklynn Ganong, FNP  08/16/2022  [facility] [address] [facility] [place] [address] [phone] (office) [phone] (fax)

## 2022-08-16 ENCOUNTER — Encounter (HOSPITAL_COMMUNITY): Payer: Self-pay

## 2022-08-16 ENCOUNTER — Ambulatory Visit (HOSPITAL_COMMUNITY)
Admission: RE | Admit: 2022-08-16 | Discharge: 2022-08-16 | Disposition: A | Discharge status: Home / Self Care | Payer: Medicare Other | Source: Ambulatory Visit | Attending: Family Medicine | Admitting: Family Medicine

## 2022-08-16 VITALS — BP 150/78 | HR 77 | Wt 212.0 lb

## 2022-08-16 DIAGNOSIS — D509 Iron deficiency anemia, unspecified: Secondary | ICD-10-CM

## 2022-08-16 DIAGNOSIS — I130 Hypertensive heart and chronic kidney disease with heart failure and stage 1 through stage 4 chronic kidney disease, or unspecified chronic kidney disease: Secondary | ICD-10-CM | POA: Insufficient documentation

## 2022-08-16 DIAGNOSIS — E1122 Type 2 diabetes mellitus with diabetic chronic kidney disease: Secondary | ICD-10-CM | POA: Insufficient documentation

## 2022-08-16 DIAGNOSIS — Z7982 Long term (current) use of aspirin: Secondary | ICD-10-CM | POA: Diagnosis not present

## 2022-08-16 DIAGNOSIS — E1159 Type 2 diabetes mellitus with other circulatory complications: Secondary | ICD-10-CM

## 2022-08-16 DIAGNOSIS — Z955 Presence of coronary angioplasty implant and graft: Secondary | ICD-10-CM | POA: Diagnosis not present

## 2022-08-16 DIAGNOSIS — Z79899 Other long term (current) drug therapy: Secondary | ICD-10-CM | POA: Diagnosis not present

## 2022-08-16 DIAGNOSIS — Z7985 Long-term (current) use of injectable non-insulin antidiabetic drugs: Secondary | ICD-10-CM | POA: Diagnosis not present

## 2022-08-16 DIAGNOSIS — I5022 Chronic systolic (congestive) heart failure: Secondary | ICD-10-CM | POA: Diagnosis present

## 2022-08-16 DIAGNOSIS — Z8249 Family history of ischemic heart disease and other diseases of the circulatory system: Secondary | ICD-10-CM | POA: Diagnosis not present

## 2022-08-16 DIAGNOSIS — I252 Old myocardial infarction: Secondary | ICD-10-CM | POA: Diagnosis not present

## 2022-08-16 DIAGNOSIS — N183 Chronic kidney disease, stage 3 unspecified: Secondary | ICD-10-CM | POA: Diagnosis not present

## 2022-08-16 DIAGNOSIS — I2510 Atherosclerotic heart disease of native coronary artery without angina pectoris: Secondary | ICD-10-CM

## 2022-08-16 DIAGNOSIS — Z7902 Long term (current) use of antithrombotics/antiplatelets: Secondary | ICD-10-CM | POA: Diagnosis not present

## 2022-08-16 DIAGNOSIS — Z833 Family history of diabetes mellitus: Secondary | ICD-10-CM | POA: Insufficient documentation

## 2022-08-16 DIAGNOSIS — N184 Chronic kidney disease, stage 4 (severe): Secondary | ICD-10-CM

## 2022-08-16 DIAGNOSIS — Z794 Long term (current) use of insulin: Secondary | ICD-10-CM | POA: Diagnosis not present

## 2022-08-16 DIAGNOSIS — I255 Ischemic cardiomyopathy: Secondary | ICD-10-CM | POA: Diagnosis not present

## 2022-08-16 LAB — BASIC METABOLIC PANEL
Anion gap: 8 (ref 5–15)
BUN: 20 mg/dL (ref 8–23)
CO2: 23 mmol/L (ref 22–32)
Calcium: 9.3 mg/dL (ref 8.9–10.3)
Chloride: 108 mmol/L (ref 98–111)
Creatinine, Ser: 1.46 mg/dL — ABNORMAL HIGH (ref 0.44–1.00)
GFR, Estimated: 40 mL/min — ABNORMAL LOW (ref >60)
Glucose, Bld: 198 mg/dL — ABNORMAL HIGH (ref 70–99)
Potassium: 4.3 mmol/L (ref 3.5–5.1)
Sodium: 139 mmol/L (ref 135–145)

## 2022-08-16 NOTE — Patient Instructions (Addendum)
Thank you for coming in today  If you had labs drawn today, any labs that are abnormal the clinic will call you No news is good news  Medications: No changes   Follow up appointments:  Your physician recommends that you schedule a follow-up appointment in:  4 month With Dr. Earlean Shawl will receive a reminder letter in the mail a few months in advance. If you don't receive a letter, please call our office to schedule the follow-up appointment.    Do the following things EVERYDAY: Weigh yourself in the morning before breakfast. Write it down and keep it in a log. Take your medicines as prescribed Eat low salt foods--Limit salt (sodium) to 2000 mg per day.  Stay as active as you can everyday Limit all fluids for the day to less than 2 liters   At the [facility], you and your health needs are our priority. As part of our continuing mission to provide you with exceptional heart care, we have created designated Provider Care Teams. These Care Teams include your primary Cardiologist (physician) and Advanced Practice Providers (APPs- Physician Assistants and Nurse Practitioners) who all work together to provide you with the care you need, when you need it.   You may see any of the following providers on your designated Care Team at your next follow up: Dr Arvilla Meres Dr Marca Ancona Dr. Marcos Eke, NP Robbie Lis, [place] [facility] [place], [place] Brynda Peon, NP Karle Plumber, PharmD   Please be sure to bring in all your medications bottles to every appointment.    Thank you for choosing [facility]  If you have any questions or concerns before your next appointment please send [name] a message through [place] or call our office at [phone].    TO LEAVE A MESSAGE FOR THE NURSE SELECT OPTION 2, PLEASE LEAVE A MESSAGE INCLUDING: YOUR NAME DATE OF BIRTH CALL BACK NUMBER REASON FOR  CALL**this is important as we prioritize the call backs  YOU WILL RECEIVE A CALL BACK THE SAME DAY AS LONG AS YOU CALL BEFORE 4:00 PM

## 2022-08-16 NOTE — Addendum Note (Signed)
Encounter addended by: Jacklynn Ganong, FNP on: 08/16/2022 11:54 AM  Actions taken: Clinical Note Signed

## 2022-08-24 ENCOUNTER — Other Ambulatory Visit: Payer: Self-pay

## 2022-08-24 MED ORDER — TICAGRELOR 60 MG PO TABS: 60.0000 mg | ORAL_TABLET | Freq: Two times a day (BID) | ORAL | 0 refills | Status: DC

## 2022-08-24 MED ORDER — CARVEDILOL 12.5 MG PO TABS: 12.5000 mg | ORAL_TABLET | Freq: Two times a day (BID) | ORAL | 0 refills | Status: DC

## 2022-08-24 NOTE — Addendum Note (Signed)
Addended by: Margaret Pyle D on: 08/24/2022 09:30 AM   Modules accepted: Orders

## 2022-10-11 ENCOUNTER — Ambulatory Visit: Payer: Medicare Other | Admitting: Nurse Practitioner

## 2022-10-11 DIAGNOSIS — E782 Mixed hyperlipidemia: Secondary | ICD-10-CM

## 2022-10-11 DIAGNOSIS — Z7985 Long-term (current) use of injectable non-insulin antidiabetic drugs: Secondary | ICD-10-CM

## 2022-10-11 DIAGNOSIS — I10 Essential (primary) hypertension: Secondary | ICD-10-CM

## 2022-10-11 DIAGNOSIS — E1122 Type 2 diabetes mellitus with diabetic chronic kidney disease: Secondary | ICD-10-CM

## 2022-10-11 DIAGNOSIS — Z794 Long term (current) use of insulin: Secondary | ICD-10-CM

## 2022-12-09 NOTE — Progress Notes (Signed)
Cardiology Office Note    Date:  12/17/2022   ID:  Stephanie Manning, Stephanie Manning 18-Oct-1960, MRN [id number]  PCP:  Virl Diamond, MD  Cardiologist: Dr. Swaziland Heart failure clinic: Dr. Shirlee Latch  Chief Complaint  Patient presents with   Congestive Heart Failure   Coronary Artery Disease    History of Present Illness:  Stephanie Manning is a 62 y.o. female with PMH of CAD, DM II, CKD stage IV, HTN, HLD, CAD and ICM.  In December 2018 she had anterior MI with DES to totally occluded mid LAD.  She returned in January 2019 for staged DES to 80% RCA disease.  Echocardiogram in December 2018 showed ejection fraction of 30 to 35%.  Repeat echocardiogram in March 2019 continue to show ejection fraction 30 to 35%.    She was admitted to the hospital on 10/10/2017 with recurrent chest pain.  She underwent Myoview which came back high risk.  She eventually underwent cardiac catheterization on 10/12/2017 which showed 75% prox LCx, 60% OM1, 40% LM, 100% mid LAD chronic occlusion post D2, 95% post lateral lesion.  Stents in the RCA and proximal LAD were patent.   She was recommended to continue aspirin and Brilinta for long-term, beyond 27-month.  Carvedilol was reduced to 6.25 mg twice daily due to bradycardia, Mobitz type I and up to 2 dropped beats and dizziness.    She has been followed closely by Dr Shirlee Latch in the CHF clinic. She is on Coreg, Entresto, and aldactone. She had follow up with Echo in June 2020 and EF was unchanged. She has been seen by Dr Johney Frame for consideration of ICD but has deferred. She is intolerant of Bidil due to lightheadedness/syncope. Also intolerant of [place]. In November  2021  EF improved to 40-45%. Noted aldactone had been held due to elevated potassium. This was resumed at lower dose. Repeat potassium was 4.0  Patient was admitted in 6/22 with poor po intake, feeling "sick."  She was noted to be dehydrated with hypotension, orthostasis, and AKI.  She was hydrated and Entresto  and spironolactone were held.  Creatinine was as high as 3.28.   She got IV fluid and developed volume overload requiring Lasix. Later CHF therapy resumed.    Echo 10/22 EF 45% with akinetic apex, normal RV.   Last seen in Heart failure clinic in February and stable. Echo was unchanged.   On follow up today she is doing very well. She denies any chest pain.  She is active gardening and walking.   Denies any SOB or increased edema. Weight is stable. Has occasional thigh cramps about once a month.     Past Medical History:  Diagnosis Date   AKI (acute kidney injury) (HCC) 02/2017   CAD in native artery    a. NSTEMI 02/2017 s/p DES to mLAD, diffuse disease otherwise, EF 30-35%. b. Staged cath 03/2017 - patent stent in the proximal to mid LAD. CTO of the distal LAD, 90% diffuse small first diagonal, 90% distal LCx/OM, 80% diffuse prox RCA, moderately elevated LVEDP , s/p DES to mRCA, residual disease treated medically (too small for PCI), EF 30-35%.   Chronic combined systolic and diastolic CHF (congestive heart failure) (HCC)    CKD (chronic kidney disease), stage IV (HCC)    Hattie Perch 04/21/2017   Diabetes mellitus, type II, insulin dependent (HCC)    GERD (gastroesophageal reflux disease)    History of blood transfusion    "related to menses"   History of  stomach ulcers 2017   Hyperlipidemia    Hypertension    Iron deficiency anemia 03/2017   "had to have iron infusions" (10/10/2017)   Ischemic cardiomyopathy    STEMI (ST elevation myocardial infarction) (HCC) 02/2017    Past Surgical History:  Procedure Laterality Date   CESAREAN SECTION  1987   CORONARY ANGIOPLASTY WITH STENT PLACEMENT  04/01/2017   drug-eluting stent was successfully placed using a STENT SYNERGY DES [id number]   CORONARY STENT INTERVENTION N/A 04/01/2017   Procedure: CORONARY STENT INTERVENTION;  Surgeon: Swaziland, Shelly Spenser M, MD;  Location: [facility] INVASIVE CV LAB;  Service: Cardiovascular;  Laterality: N/A;    CORONARY/GRAFT ACUTE MI REVASCULARIZATION N/A 03/10/2017   Procedure: Coronary/Graft Acute MI Revascularization;  Surgeon: Swaziland, Devone Bonilla M, MD;  Location: [facility] INVASIVE CV LAB;  Service: Cardiovascular;  Laterality: N/A;   LEFT HEART CATH AND CORONARY ANGIOGRAPHY N/A 03/10/2017   Procedure: LEFT HEART CATH AND CORONARY ANGIOGRAPHY;  Surgeon: Swaziland, Fannye Myer M, MD;  Location: [facility] INVASIVE CV LAB;  Service: Cardiovascular;  Laterality: N/A;   LEFT HEART CATH AND CORONARY ANGIOGRAPHY N/A 04/01/2017   Procedure: LEFT HEART CATH AND CORONARY ANGIOGRAPHY;  Surgeon: Swaziland, Naylee Frankowski M, MD;  Location: [facility] INVASIVE CV LAB;  Service: Cardiovascular;  Laterality: N/A;   LEFT HEART CATH AND CORONARY ANGIOGRAPHY N/A 10/12/2017   Procedure: LEFT HEART CATH AND CORONARY ANGIOGRAPHY;  Surgeon: Tonny Bollman, MD;  Location: [facility] INVASIVE CV LAB;  Service: Cardiovascular;  Laterality: N/A;   OOPHORECTOMY     "1 ovary removed"   THROAT SURGERY  02/2014   "nodule in my throat removed; not related to thyroid"   TOOTH EXTRACTION  07/2021    Current Medications: Outpatient Medications Prior to Visit  Medication Sig Dispense Refill   ACCU-CHEK GUIDE test strip Check blood sugar 2 times a day before breakfast and every night before bedtime. 200 each 3   Accu-Chek Softclix Lancets lancets Use as instructed 100 each 2   aspirin 81 MG chewable tablet Chew 1 tablet (81 mg total) by mouth daily.     blood glucose meter kit and supplies Dispense based on patient and insurance preference. Use up to four times daily as directed. (FOR ICD-10 E11.65) Pt prefers One touch mini 1 each 5   Dulaglutide (TRULICITY) 1.5 MG/0.5ML SOPN Inject 1.5 mg into the skin once a week. 6 mL 3   ELDERBERRY PO Take 2 tablets by mouth every other day.      ferrous sulfate 300 (60 Fe) MG/5ML syrup Take 300 mg by mouth daily with breakfast.     gabapentin (NEURONTIN) 300 MG capsule Take 300 mg by mouth at bedtime.      insulin detemir (LEVEMIR) 100 UNIT/ML  injection Inject 40 Units into the skin at bedtime.     Multiple Vitamin (MULTIVITAMIN WITH MINERALS) TABS tablet Take 1 tablet by mouth daily.     nitroGLYCERIN (NITROSTAT) 0.4 MG SL tablet Dissolve 1 tab under tongue for chest pain- if pain remains after 5 min, call 911, maxof 3 tabs in 15 minutes 25 tablet 2   NONFORMULARY OR COMPOUNDED ITEM Seamoss once daily as needed     pantoprazole (PROTONIX) 40 MG tablet Take 1 tablet (40 mg total) by mouth 2 (two) times daily. 180 tablet 3   Saline 0.2 % SOLN Place 1 spray into both nostrils as needed (congestion).      tiZANidine (ZANAFLEX) 4 MG tablet Take 4 mg by mouth at bedtime.     atorvastatin (LIPITOR) 80  MG tablet Take 80 mg by mouth daily.     carvedilol (COREG) 12.5 MG tablet Take 1 tablet (12.5 mg total) by mouth 2 (two) times daily with a meal. 180 tablet 0   furosemide (LASIX) 20 MG tablet Take 1 tablet (20 mg total) by mouth daily as needed. 90 tablet 3   sacubitril-valsartan (ENTRESTO) 24-26 MG Take 1 tablet by mouth 2 (two) times daily. 60 tablet 11   spironolactone (ALDACTONE) 25 MG tablet Take 1 tablet (25 mg total) by mouth daily. 90 tablet 3   ticagrelor (BRILINTA) 60 MG TABS tablet Take 1 tablet (60 mg total) by mouth 2 (two) times daily. 180 tablet 0   No facility-administered medications prior to visit.     Allergies:   Hydralazine, Bidil [isosorb dinitrate-hydralazine], Ciprofloxacin, Farxiga [dapagliflozin], Hydralazine hcl, Isosorbide dinitrate, Ketorolac, Nsaids, Propoxycaine, Amoxicillin, Hydrocodone, Lisinopril, Meloxicam, Strawberry extract, Tape, and Toradol [ketorolac tromethamine]   Social History   Socioeconomic History   Marital status: Widowed    Spouse name: Not on file   Number of children: 2   Years of education: Not on file   Highest education level: Not on file  Occupational History   Not on file  Tobacco Use   Smoking status: Never   Smokeless tobacco: Never  Vaping Use   Vaping status: Never Used   Substance and Sexual Activity   Alcohol use: Never   Drug use: Never   Sexual activity: Yes  Other Topics Concern   Not on file  Social History Narrative   Not on file   Social Determinants of Health   Financial Resource Strain: Low Risk  (05/04/2021)   Received from [facility], [facility]   Overall Financial Resource Strain (CARDIA)    Difficulty of Paying Living Expenses: Not very hard  Food Insecurity: No Food Insecurity (05/04/2021)   Received from [facility], [facility]   Hunger Vital Sign    Worried About Running Out of Food in the Last Year: Never true    Ran Out of Food in the Last Year: Never true  Transportation Needs: No Transportation Needs (05/04/2021)   Received from [facility], [facility]   [facility] - Transportation    Lack of Transportation (Medical): No    Lack of Transportation (Non-Medical): No  Physical Activity: Inactive (05/04/2021)   Received from [facility], [facility]   Exercise Vital Sign    Days of Exercise per Week: 0 days    Minutes of Exercise per Session: 0 min  Stress: No Stress Concern Present (05/04/2021)   Received from [facility], [facility] - Occupational Stress Questionnaire    Feeling of Stress : Only a little  Social Connections: Socially Integrated (05/04/2021)   Received from [facility], [facility]   Social Connection and Isolation Panel [NHANES]    Frequency of Communication with Friends and Family: More than three times a week    Frequency of Social Gatherings with Friends and Family: More than three times a week    Attends Religious Services: 1 to 4 times per year    Active Member of [employer] or Organizations: Yes    Attends Engineer, structural: More than 4 times per year    Marital Status: Married     Family History:  The patient's family history includes Diabetes in her mother; Heart disease in her mother; Hypertension in  her mother; Stroke in her mother.   ROS:   Please see the history of present illness.    ROS All other systems reviewed and are negative.   PHYSICAL EXAM:   VS:  BP 134/70 (BP Location: Left Arm, Cuff Size: Normal)   Pulse 66   Ht 5\' 8"  (1.727 m)   Wt 213 lb 9.6 oz (96.9 kg)   SpO2 98%   BMI 32.48 kg/m    GEN: Well nourished, well developed, in no acute distress  HEENT: normal  Neck: no JVD, carotid bruits, or masses Cardiac: RRR; no murmurs, rubs, or gallops,no edema  Respiratory:  clear to auscultation bilaterally, normal work of breathing GI: soft, nontender, nondistended, + BS MS: no deformity or atrophy  Skin: warm and dry, no rash Neuro:  Alert and Oriented x 3, Strength and sensation are intact Psych: euthymic mood, full affect  Wt Readings from Last 3 Encounters:  12/17/22 213 lb 9.6 oz (96.9 kg)  08/16/22 212 lb (96.2 kg)  06/08/22 215 lb 9.6 oz (97.8 kg)      Studies/Labs Reviewed:   EKG:  EKG is not ordered today.    Recent Labs: 08/16/2022: BUN 20; Creatinine, Ser 1.46; Potassium 4.3; Sodium 139   Lipid Panel    Component Value Date/Time   CHOL 118 04/22/2022 1218   CHOL 94 (L) 05/27/2021 1001   TRIG 60 04/22/2022 1218   HDL 45 04/22/2022 1218   HDL 38 (L) 05/27/2021 1001   CHOLHDL 2.6 04/22/2022 1218   VLDL 12 04/22/2022 1218   LDLCALC 61 04/22/2022 1218   LDLCALC 43 05/27/2021 1001   Labs dated 04/30/19: Hgb 9.6. BUN 24, creatinine 1.62. otherwise CMET normal. Glucose 80 Dated 04/16/21 creatinine 1.54.  Dated 09/08/21: cholesterol 122, triglycerides 79, HDL 40, LDL 65. BUN 27, creatinine 1.92, other chemistries OK. Hgb 10.2. ferritin 134. A1c 7.6%.   Additional studies/ records that were reviewed today include:   Cath 10/12/2017 Ost LM lesion is 40% stenosed. Prox Cx to Dist Cx lesion is 75% stenosed. Ost 1st Mrg lesion is 60% stenosed. Previously placed Prox LAD to Mid LAD stent (unknown type) is widely patent. Mid LAD lesion is 100%  stenosed. Ost 2nd Diag to 2nd Diag lesion is 70% stenosed. Prox RCA lesion is 30% stenosed. Mid RCA lesion is 40% stenosed. Post Atrio lesion is 95% stenosed.   1.  Continued patency of the stented segment in the proximal LAD and stented segment in the proximal RCA 2.  Progressive small vessel CAD involving the distal left circumflex and right posterolateral branch 3.  Chronic occlusion of the mid LAD beyond the second diagonal branch unchanged in appearance from the previous study 4.  Normal LVEDP   Recommendation: Ongoing aggressive medical therapy.  The patient's probable culprit lesion is a severe stenosis in the distal posterior lateral branch involving a small territory and small caliber vessel that I think would be best treated with medical therapy rather than intervention.   Recommend dual antiplatelet therapy with Aspirin 81mg  daily and Ticagrelor 90mg  twice daily long-term (beyond 12 months) because of diffuse CAD/diabetes/multivessel stenting.  Echo 09/01/18: IMPRESSIONS      1. The left ventricle has moderate-severely reduced systolic function, with an ejection fraction of 30-35%. The cavity size was mildly dilated. Left ventricular diastolic Doppler parameters are consistent with pseudonormalization.  2. The right ventricle has normal systolic function. The cavity was normal.  3. The mitral valve is grossly normal.  4. The aortic valve is  tricuspid. Mild thickening of the aortic valve. No stenosis of the aortic valve.  5. Technically difficult; definity used; akinesis of the apex with overall moderate to severe LV dysfunction; moderate diastolic dysfunction.  Echo 01/28/20: IMPRESSIONS     1. Left ventricular ejection fraction, by estimation, is 40 to 45%. The  left ventricle has mildly decreased function. The left ventricle  demonstrates regional wall motion abnormalities with apical septal and  apical akinesis. There is mild left  ventricular hypertrophy. Left ventricular  diastolic parameters are  consistent with Grade I diastolic dysfunction (impaired relaxation).   2. Right ventricular systolic function is normal. The right ventricular  size is normal. Tricuspid regurgitation signal is inadequate for assessing  PA pressure.   3. The mitral valve is normal in structure. No evidence of mitral valve  regurgitation. No evidence of mitral stenosis.   4. The aortic valve is tricuspid. Aortic valve regurgitation is not  visualized.   5. The inferior vena cava is normal in size with <50% respiratory  variability, suggesting right atrial pressure of 8 mmHg.   Echo 12/23/20: IMPRESSIONS     1. Left ventricular ejection fraction, by estimation, is 45%. The left  ventricle has mildly decreased function. The left ventricle demonstrates  regional wall motion abnormalities with apical anterior, apical inferior,  and true apex akinesis. The left  ventricular internal cavity size was mildly dilated. Left ventricular  diastolic parameters are consistent with Grade II diastolic dysfunction  (pseudonormalization).   2. Right ventricular systolic function is normal. The right ventricular  size is normal. Tricuspid regurgitation signal is inadequate for assessing  PA pressure.   3. The mitral valve is normal in structure. Trivial mitral valve  regurgitation. No evidence of mitral stenosis.   4. The aortic valve is tricuspid. Aortic valve regurgitation is not  visualized. No aortic stenosis is present.   5. The inferior vena cava is normal in size with greater than 50%  respiratory variability, suggesting right atrial pressure of 3 mmHg.   Echo 05/17/22: IMPRESSIONS     1. Left ventricular ejection fraction, by estimation, is 45%. The left  ventricle has mildly decreased function. The left ventricle demonstrates  regional wall motion abnormalities (see scoring diagram/findings for  description). The mid-to-apical  inferoseptal, apical inferior, apical anterior and  apex are akinetic. Left  ventricular diastolic parameters are consistent with Grade II diastolic  dysfunction (pseudonormalization). The average left ventricular global  longitudinal strain is -10.0 %. The  global longitudinal strain is abnormal.   2. Right ventricular systolic function is normal. The right ventricular  size is normal. Tricuspid regurgitation signal is inadequate for assessing  PA pressure.   3. The mitral valve is normal in structure. Mild mitral valve  regurgitation.   4. The aortic valve is tricuspid. Aortic valve regurgitation is not  visualized. No aortic stenosis is present.   5. The inferior vena cava is normal in size with <50% respiratory  variability, suggesting right atrial pressure of 8 mmHg.   Comparison(s): No significant change from prior study.     ASSESSMENT:    1. Chronic systolic heart failure (HCC)   2. Coronary artery disease involving native coronary artery of native heart without angina pectoris   3. CKD (chronic kidney disease), stage IV (HCC)   4. DM type 2 causing vascular disease (HCC)   5. Hyperlipidemia with target LDL less than 100        PLAN:  In order of problems listed above:  Chronic  combined systolic and diastolic heart failure: EF 45%.  On maximal Coreg due to history of bradycardia and presence of Mobitz 1 heart block during prior  hospitalization.  On Entresto maximal dose (due to hypotension) and aldactone. Unable to tolerate Bidil due to lightheadedness but is tolerating Imdur OK.  Intolerant of [place]. Clinically euvolemic.  CAD: s/p DES of LAD in 2018 in setting of anterior MI. S/p DES of RCA in January 2019. Marland Kitchen She has extensive small distal branch vessel disease in distal LAD (CTO), small diagonal, distal LCX, and 95% PL lesion. these are not amenable to PCI due to small vessel size. High risk Myoview in July 2019 with cardiac cath as noted. Medical management.  Class 1  angina. On DAPT, statin, Coreg and Imdur.    Hypertension: Blood pressure well controlled   Hyperlipidemia: On Lipitor 80 mg daily.  LDL 61 at goal.   DM2: managed by endocrinology.   6.   CKD stage IV. Creatinine in June 1.46.   Follow up in one year  Medication Adjustments/Labs and Tests Ordered: Current medicines are reviewed at length with the patient today.  Concerns regarding medicines are outlined above.  Medication changes, Labs and Tests ordered today are listed in the Patient Instructions below. There are no Patient Instructions on file for this visit.    Signed, Cyan Moultrie Swaziland, MD  12/17/2022 11:56 AM    [facility] [address] [place], [place], [place]  [address] Phone: [phone]; Fax: [phone]

## 2022-12-17 ENCOUNTER — Ambulatory Visit: Attending: Cardiology | Admitting: Cardiology

## 2022-12-17 ENCOUNTER — Other Ambulatory Visit (HOSPITAL_COMMUNITY): Payer: Self-pay

## 2022-12-17 ENCOUNTER — Encounter: Payer: Self-pay | Admitting: Cardiology

## 2022-12-17 VITALS — BP 134/70 | HR 66 | Ht 68.0 in | Wt 213.6 lb

## 2022-12-17 DIAGNOSIS — E1159 Type 2 diabetes mellitus with other circulatory complications: Secondary | ICD-10-CM

## 2022-12-17 DIAGNOSIS — E785 Hyperlipidemia, unspecified: Secondary | ICD-10-CM

## 2022-12-17 DIAGNOSIS — N184 Chronic kidney disease, stage 4 (severe): Secondary | ICD-10-CM | POA: Diagnosis not present

## 2022-12-17 DIAGNOSIS — I5022 Chronic systolic (congestive) heart failure: Secondary | ICD-10-CM | POA: Diagnosis not present

## 2022-12-17 DIAGNOSIS — I2510 Atherosclerotic heart disease of native coronary artery without angina pectoris: Secondary | ICD-10-CM | POA: Diagnosis not present

## 2022-12-17 MED ORDER — ATORVASTATIN CALCIUM 80 MG PO TABS: 80.0000 mg | ORAL_TABLET | Freq: Every day | ORAL | 3 refills | Status: DC

## 2022-12-17 MED ORDER — ENTRESTO 24-26 MG PO TABS: 1.0000 | ORAL_TABLET | Freq: Two times a day (BID) | ORAL | 3 refills | Status: DC

## 2022-12-17 MED ORDER — TICAGRELOR 60 MG PO TABS: 60.0000 mg | ORAL_TABLET | Freq: Two times a day (BID) | ORAL | 0 refills | Status: AC

## 2022-12-17 MED ORDER — FUROSEMIDE 20 MG PO TABS: 20.0000 mg | ORAL_TABLET | Freq: Every day | ORAL | 3 refills | Status: DC | PRN

## 2022-12-17 MED ORDER — CARVEDILOL 12.5 MG PO TABS: 12.5000 mg | ORAL_TABLET | Freq: Two times a day (BID) | ORAL | 3 refills | Status: DC

## 2022-12-17 MED ORDER — SPIRONOLACTONE 25 MG PO TABS: 25.0000 mg | ORAL_TABLET | Freq: Every day | ORAL | 3 refills | Status: DC

## 2022-12-17 MED ORDER — ENTRESTO 24-26 MG PO TABS: 1.0000 | ORAL_TABLET | Freq: Two times a day (BID) | ORAL | 11 refills | Status: DC

## 2022-12-17 NOTE — Patient Instructions (Signed)
Medication Instructions:  Continue same medications *If you need a refill on your cardiac medications before your next appointment, please call your pharmacy*   Lab Work: None ordered   Testing/Procedures: None ordered   Follow-Up: At [facility], you and your health needs are our priority.  As part of our continuing mission to provide you with exceptional heart care, we have created designated Provider Care Teams.  These Care Teams include your primary Cardiologist (physician) and Advanced Practice Providers (APPs -  Physician Assistants and Nurse Practitioners) who all work together to provide you with the care you need, when you need it.  We recommend signing up for the patient portal called "MyChart".  Sign up information is provided on this After Visit Summary.  MyChart is used to connect with patients for Virtual Visits (Telemedicine).  Patients are able to view lab/test results, encounter notes, upcoming appointments, etc.  Non-urgent messages can be sent to your provider as well.   To learn more about what you can do with MyChart, go to [email or web address].    Your next appointment:  1 year   Call in June to schedule Oct appointment     Provider:  Dr.Jordan

## 2023-07-29 ENCOUNTER — Telehealth: Payer: Self-pay | Admitting: Cardiology

## 2023-07-29 ENCOUNTER — Telehealth (HOSPITAL_COMMUNITY): Payer: Self-pay | Admitting: Cardiology

## 2023-07-29 NOTE — Telephone Encounter (Signed)
 Patient identification verified by 2 forms. Sims Duck, RN     Called and spoke to patient  Patient states:  - Not currently having chest pain. Main concern is hypertension and that she has been trying to schedule an appointment with [facility] without success.  - She would like to make an appointment with Dr. Mitzie Anda or Dr. Swaziland.   Patient denies:              Interventions/Plan: - Previous telephone encounter shows patient called at 11:17am and 3:56pm. Message was routed to [facility] triage pool but neither messaged addressed.  - Patient scheduled with Dr. Swaziland in July and added to waitlist.  - Encounter forwarded to covering nurse and [facility] for follow up.    Reviewed ED warning signs/precautions  Patient agrees with plan, no questions at this time

## 2023-07-29 NOTE — Telephone Encounter (Signed)
 Patient identification verified by 2 forms. Sims Duck, RN     Called patient. No answer. LVMTCB . Based on reported symptoms DOD appt available for 08/02/23 with Dr. Filiberto Hug being held until 5pm today.

## 2023-07-29 NOTE — Telephone Encounter (Signed)
 Patient called front office and expressed frustration about getting an appt with Dr. Mitzie Anda.   Patient wanted to speak to a nurse about having some chest pain.   Front office tried to assist pt with getting an appt with the app clinic, which pt declined.   Her telephone number is [phone].   Can someone call her and assist with the chest pain concerns?  Please advise.  Thank you!

## 2023-07-29 NOTE — Telephone Encounter (Signed)
 Pt c/o of Chest Pain: STAT if active (IN THIS MOMENT) CP, including tightness, pressure, jaw pain, shoulder/upper arm/back pain, SOB, nausea, and vomiting.  1. Are you having CP right now (tightness, pressure, or discomfort)? No  2. Are you experiencing any other symptoms (ex. SOB, nausea, vomiting, sweating)? SOB  3. How long have you been experiencing CP? 3 Weeks ago  4. Is your CP continuous or coming and going? Coming and going   5. Have you taken Nitroglycerin ? Yes, does relieve the chest pain  6. If CP returns before callback, please consider calling 911. ?    Patient stated she has been having high BP. Patient stated she has not checked her BP this morning, but yesterday morning her BP was 157/90. Please advise.

## 2023-08-01 NOTE — Telephone Encounter (Signed)
 Young child answered phone and pts is at [employer] Will try again later

## 2023-08-04 NOTE — Telephone Encounter (Signed)
 Spoke to patient she stated she saw PCP yesterday 5/21.Stated her B/P is much better 135/86.Stated she will keep appointment already scheduled with Dr.Jordan 7/3 at 3:40 pm.

## 2023-09-09 NOTE — Progress Notes (Unsigned)
 Cardiology Office Note    Date:  09/15/2023   ID:  Merril, Isakson [birth year], MRN [id number]  PCP:  Hollis Alan ORN, MD  Cardiologist: Dr. [place] Heart failure clinic: Dr. Rolan  Chief Complaint  Patient presents with   Coronary Artery Disease   Congestive Heart Failure    History of Present Illness:  Stephanie Manning is a 63 y.o. female with PMH of CAD, DM II, CKD stage IV, HTN, HLD, CAD and ICM.  In December 2018 she had anterior MI with DES to totally occluded mid LAD.  She returned in January 2019 for staged DES to 80% RCA disease.  Echocardiogram in December 2018 showed ejection fraction of 30 to 35%.  Repeat echocardiogram in March 2019 continue to show ejection fraction 30 to 35%.    She was admitted to the hospital on 10/10/2017 with recurrent chest pain.  She underwent Myoview  which came back high risk.  She eventually underwent cardiac catheterization on 10/12/2017 which showed 75% prox LCx, 60% OM1, 40% LM, 100% mid LAD chronic occlusion post D2, 95% post lateral lesion.  Stents in the RCA and proximal LAD were patent.   She was recommended to continue aspirin  and Brilinta  for long-term, beyond 16-month.  Carvedilol  was reduced to 6.25 mg twice daily due to bradycardia, Mobitz type I and up to 2 dropped beats and dizziness.    She has been followed closely by Dr Rolan in the CHF clinic. She is on Coreg , Entresto , and aldactone . She had follow up with Echo in June 2020 and EF was unchanged. She has been seen by Dr Kelsie for consideration of ICD but has deferred. She is intolerant of Bidil due to lightheadedness/syncope. Also intolerant of Farxiga . In November  2021  EF improved to 40-45%. Noted aldactone  had been held due to elevated potassium. This was resumed at lower dose. Repeat potassium was normal.   Patient was admitted in 6/22 with poor po intake, feeling sick.  She was noted to be dehydrated with hypotension, orthostasis, and AKI.  She was hydrated and  Entresto  and spironolactone  were held.  Creatinine was as high as 3.28.   She got IV fluid and developed volume overload requiring Lasix . Later CHF therapy resumed.    Echo 10/22 EF 45% with akinetic apex, normal RV.   Last seen in Heart failure clinic in February 2024 and stable. Echo was unchanged.   On follow up today she is doing very well. She denies any chest pain.  She is active gardening and walking.   Denies any SOB or increased edema. Weight is stable. Has occasional thigh cramps about once a month. Labs from PCP show last A1c 9%. LDL had gone up to 125 but on last check was 79.     Past Medical History:  Diagnosis Date   AKI (acute kidney injury) (HCC) 02/2017   CAD in native artery    a. NSTEMI 02/2017 s/p DES to mLAD, diffuse disease otherwise, EF 30-35%. b. Staged cath 03/2017 - patent stent in the proximal to mid LAD. CTO of the distal LAD, 90% diffuse small first diagonal, 90% distal LCx/OM, 80% diffuse prox RCA, moderately elevated LVEDP , s/p DES to mRCA, residual disease treated medically (too small for PCI), EF 30-35%.   Chronic combined systolic and diastolic CHF (congestive heart failure) (HCC)    CKD (chronic kidney disease), stage IV (HCC)    thelbert 04/21/2017   Diabetes mellitus, type II, insulin  dependent (HCC)  GERD (gastroesophageal reflux disease)    History of blood transfusion    related to menses   History of stomach ulcers 2017   Hyperlipidemia    Hypertension    Iron deficiency anemia 03/2017   had to have iron infusions (10/10/2017)   Ischemic cardiomyopathy    STEMI (ST elevation myocardial infarction) (HCC) 02/2017    Past Surgical History:  Procedure Laterality Date   CESAREAN SECTION  1987   CORONARY ANGIOPLASTY WITH STENT PLACEMENT  04/01/2017   drug-eluting stent was successfully placed using a STENT SYNERGY DES [id number]   CORONARY STENT INTERVENTION N/A 04/01/2017   Procedure: CORONARY STENT INTERVENTION;  Surgeon: [place], Jerzy Crotteau M,  MD;  Location: [facility] INVASIVE CV LAB;  Service: Cardiovascular;  Laterality: N/A;   CORONARY/GRAFT ACUTE MI REVASCULARIZATION N/A 03/10/2017   Procedure: Coronary/Graft Acute MI Revascularization;  Surgeon: [place], Ricketta Colantonio M, MD;  Location: [facility] INVASIVE CV LAB;  Service: Cardiovascular;  Laterality: N/A;   LEFT HEART CATH AND CORONARY ANGIOGRAPHY N/A 03/10/2017   Procedure: LEFT HEART CATH AND CORONARY ANGIOGRAPHY;  Surgeon: [place], Aundra Pung M, MD;  Location: [facility] INVASIVE CV LAB;  Service: Cardiovascular;  Laterality: N/A;   LEFT HEART CATH AND CORONARY ANGIOGRAPHY N/A 04/01/2017   Procedure: LEFT HEART CATH AND CORONARY ANGIOGRAPHY;  Surgeon: [place], Manar Smalling M, MD;  Location: [facility] INVASIVE CV LAB;  Service: Cardiovascular;  Laterality: N/A;   LEFT HEART CATH AND CORONARY ANGIOGRAPHY N/A 10/12/2017   Procedure: LEFT HEART CATH AND CORONARY ANGIOGRAPHY;  Surgeon: Wonda Sharper, MD;  Location: [facility] INVASIVE CV LAB;  Service: Cardiovascular;  Laterality: N/A;   OOPHORECTOMY     1 ovary removed   THROAT SURGERY  02/2014   nodule in my throat removed; not related to thyroid    TOOTH EXTRACTION  07/2021    Current Medications: Outpatient Medications Prior to Visit  Medication Sig Dispense Refill   ACCU-CHEK GUIDE test strip Check blood sugar 2 times a day before breakfast and every night before bedtime. 200 each 3   Accu-Chek Softclix Lancets lancets Use as instructed 100 each 2   aspirin  81 MG chewable tablet Chew 1 tablet (81 mg total) by mouth daily.     atorvastatin  (LIPITOR ) 80 MG tablet Take 1 tablet (80 mg total) by mouth daily. 90 tablet 3   blood glucose meter kit and supplies Dispense based on patient and insurance preference. Use up to four times daily as directed. (FOR ICD-10 E11.65) Pt prefers One touch mini 1 each 5   carvedilol  (COREG ) 12.5 MG tablet Take 1 tablet (12.5 mg total) by mouth 2 (two) times daily with a meal. 180 tablet 3   ELDERBERRY PO Take 2 tablets by mouth every other day.       ferrous sulfate  300 (60 Fe) MG/5ML syrup Take 300 mg by mouth daily with breakfast.     furosemide  (LASIX ) 20 MG tablet Take 1 tablet (20 mg total) by mouth daily as needed. 90 tablet 3   gabapentin  (NEURONTIN ) 300 MG capsule Take 300 mg by mouth at bedtime.      glipiZIDE  (GLUCOTROL ) 5 MG tablet Take 5 mg by mouth as needed (high bs).     Multiple Vitamin (MULTIVITAMIN WITH MINERALS) TABS tablet Take 1 tablet by mouth daily.     nitroGLYCERIN  (NITROSTAT ) 0.4 MG SL tablet Dissolve 1 tab under tongue for chest pain- if pain remains after 5 min, call 911, maxof 3 tabs in 15 minutes 25 tablet 2   NONFORMULARY OR COMPOUNDED ITEM Seamoss  once daily as needed     pantoprazole  (PROTONIX ) 40 MG tablet Take 1 tablet (40 mg total) by mouth 2 (two) times daily. 180 tablet 3   sacubitril -valsartan  (ENTRESTO ) 24-26 MG Take 1 tablet by mouth 2 (two) times daily. 180 tablet 3   Saline 0.2 % SOLN Place 1 spray into both nostrils as needed (congestion).      spironolactone  (ALDACTONE ) 25 MG tablet Take 1 tablet (25 mg total) by mouth daily. 90 tablet 3   ticagrelor  (BRILINTA ) 60 MG TABS tablet Take 1 tablet (60 mg total) by mouth 2 (two) times daily. 180 tablet 0   tiZANidine  (ZANAFLEX ) 4 MG tablet Take 4 mg by mouth at bedtime.     TRESIBA FLEXTOUCH 100 UNIT/ML FlexTouch Pen at bedtime.     Dulaglutide  (TRULICITY ) 1.5 MG/0.5ML SOPN Inject 1.5 mg into the skin once a week. (Patient not taking: Reported on 09/15/2023) 6 mL 3   insulin  detemir (LEVEMIR ) 100 UNIT/ML injection Inject 40 Units into the skin at bedtime. (Patient not taking: Reported on 09/15/2023)     No facility-administered medications prior to visit.     Allergies:   Hydralazine , Bidil [isosorb dinitrate-hydralazine ], Ciprofloxacin, Farxiga  [dapagliflozin ], Hydralazine  hcl, Isosorbide  dinitrate, Ketorolac, Nsaids, Propoxycaine, Amoxicillin, Hydrocodone, Lisinopril, Meloxicam, Strawberry extract, Tape, and Toradol [ketorolac tromethamine]   Social  History   Socioeconomic History   Marital status: Widowed    Spouse name: Not on file   Number of children: 2   Years of education: Not on file   Highest education level: Not on file  Occupational History   Not on file  Tobacco Use   Smoking status: Never   Smokeless tobacco: Never  Vaping Use   Vaping status: Never Used  Substance and Sexual Activity   Alcohol use: Never   Drug use: Never   Sexual activity: Yes  Other Topics Concern   Not on file  Social History Narrative   Not on file   Social Drivers of Health   Financial Resource Strain: Low Risk  (05/04/2021)   Received from [facility]   Overall Financial Resource Strain (CARDIA)    Difficulty of Paying Living Expenses: Not very hard  Food Insecurity: No Food Insecurity (05/04/2021)   Received from [facility]   Hunger Vital Sign    Within the past 12 months, you worried that your food would run out before you got the money to buy more.: Never true    Within the past 12 months, the food you bought just didn't last and you didn't have money to get more.: Never true  Transportation Needs: No Transportation Needs (05/04/2021)   Received from [facility] - Transportation    Lack of Transportation (Medical): No    Lack of Transportation (Non-Medical): No  Physical Activity: Inactive (05/04/2021)   Received from [facility]   Exercise Vital Sign    On average, how many days per week do you engage in moderate to strenuous exercise (like a brisk walk)?: 0 days    On average, how many minutes do you engage in exercise at this level?: 0 min  Stress: No Stress Concern Present (05/04/2021)   Received from [facility] - Occupational Stress Questionnaire    Feeling of Stress : Only a little  Social Connections: Socially Integrated (05/04/2021)   Received from [facility]   Social Connection and Isolation Panel    In a typical  week, how many times do you  talk on the phone with family, friends, or neighbors?: More than three times a week    How often do you get together with friends or relatives?: More than three times a week    How often do you attend church or religious services?: 1 to 4 times per year    Do you belong to any clubs or organizations such as church groups, unions, fraternal or athletic groups, or school groups?: Yes    How often do you attend meetings of the clubs or organizations you belong to?: More than 4 times per year    Are you married, widowed, divorced, separated, never married, or living with a partner?: Married     Family History:  The patient's family history includes Diabetes in her mother; Heart disease in her mother; Hypertension in her mother; Stroke in her mother.   ROS:   Please see the history of present illness.    ROS All other systems reviewed and are negative.   PHYSICAL EXAM:   VS:  BP 135/82 (BP Location: Left Arm)   Pulse 69   Ht 5' 8.5 (1.74 m)   Wt 222 lb (100.7 kg)   SpO2 97%   BMI 33.26 kg/m    GEN: Well nourished, well developed, in no acute distress  HEENT: normal  Neck: no JVD, carotid bruits, or masses Cardiac: RRR; no murmurs, rubs, or gallops,no edema  Respiratory:  clear to auscultation bilaterally, normal work of breathing GI: soft, nontender, nondistended, + BS MS: no deformity or atrophy  Skin: warm and dry, no rash Neuro:  Alert and Oriented x 3, Strength and sensation are intact Psych: euthymic mood, full affect  Wt Readings from Last 3 Encounters:  09/15/23 222 lb (100.7 kg)  12/17/22 213 lb 9.6 oz (96.9 kg)  08/16/22 212 lb (96.2 kg)      Studies/Labs Reviewed:   EKG Interpretation Date/Time:  Thursday September 15 2023 15:23:56 EDT Ventricular Rate:  69 PR Interval:  166 QRS Duration:  86 QT Interval:  426 QTC Calculation: 456 R Axis:   24  Text Interpretation: Sinus rhythm with marked sinus arrhythmia with Premature ventricular complexes or Fusion complexes  Septal infarct , age undetermined ST & T wave abnormality, consider inferior ischemia ST & T wave abnormality, consider anterolateral ischemia When compared with ECG of 22-Apr-2022 11:55, Fusion complexes are now Present Premature ventricular complexes are now Present Septal infarct is now Present Confirmed by [place], Saadiq Poche [phone]) on 09/15/2023 3:35:33 PM   Recent Labs: No results found for requested labs within last 365 days.   Lipid Panel    Component Value Date/Time   CHOL 118 04/22/2022 1218   CHOL 94 (L) 05/27/2021 1001   TRIG 60 04/22/2022 1218   HDL 45 04/22/2022 1218   HDL 38 (L) 05/27/2021 1001   CHOLHDL 2.6 04/22/2022 1218   VLDL 12 04/22/2022 1218   LDLCALC 61 04/22/2022 1218   LDLCALC 43 05/27/2021 1001   Labs dated 04/30/19: Hgb 9.6. BUN 24, creatinine 1.62. otherwise CMET normal. Glucose 80 Dated 04/16/21 creatinine 1.54.  Dated 09/08/21: cholesterol 122, triglycerides 79, HDL 40, LDL 65. BUN 27, creatinine 1.92, other chemistries OK. Hgb 10.2. ferritin 134. A1c 7.6%.   Additional studies/ records that were reviewed today include:   Cath 10/12/2017 Ost LM lesion is 40% stenosed. Prox Cx to Dist Cx lesion is 75% stenosed. Ost 1st Mrg lesion is 60% stenosed. Previously placed Prox LAD to Mid LAD  stent (unknown type) is widely patent. Mid LAD lesion is 100% stenosed. Ost 2nd Diag to 2nd Diag lesion is 70% stenosed. Prox RCA lesion is 30% stenosed. Mid RCA lesion is 40% stenosed. Post Atrio lesion is 95% stenosed.   1.  Continued patency of the stented segment in the proximal LAD and stented segment in the proximal RCA 2.  Progressive small vessel CAD involving the distal left circumflex and right posterolateral branch 3.  Chronic occlusion of the mid LAD beyond the second diagonal branch unchanged in appearance from the previous study 4.  Normal LVEDP   Recommendation: Ongoing aggressive medical therapy.  The patient's probable culprit lesion is a severe stenosis in the  distal posterior lateral branch involving a small territory and small caliber vessel that I think would be best treated with medical therapy rather than intervention.   Recommend dual antiplatelet therapy with Aspirin  81mg  daily and Ticagrelor  90mg  twice daily long-term (beyond 12 months) because of diffuse CAD/diabetes/multivessel stenting.  Echo 09/01/18: IMPRESSIONS      1. The left ventricle has moderate-severely reduced systolic function, with an ejection fraction of 30-35%. The cavity size was mildly dilated. Left ventricular diastolic Doppler parameters are consistent with pseudonormalization.  2. The right ventricle has normal systolic function. The cavity was normal.  3. The mitral valve is grossly normal.  4. The aortic valve is tricuspid. Mild thickening of the aortic valve. No stenosis of the aortic valve.  5. Technically difficult; definity  used; akinesis of the apex with overall moderate to severe LV dysfunction; moderate diastolic dysfunction.  Echo 01/28/20: IMPRESSIONS     1. Left ventricular ejection fraction, by estimation, is 40 to 45%. The  left ventricle has mildly decreased function. The left ventricle  demonstrates regional wall motion abnormalities with apical septal and  apical akinesis. There is mild left  ventricular hypertrophy. Left ventricular diastolic parameters are  consistent with Grade I diastolic dysfunction (impaired relaxation).   2. Right ventricular systolic function is normal. The right ventricular  size is normal. Tricuspid regurgitation signal is inadequate for assessing  PA pressure.   3. The mitral valve is normal in structure. No evidence of mitral valve  regurgitation. No evidence of mitral stenosis.   4. The aortic valve is tricuspid. Aortic valve regurgitation is not  visualized.   5. The inferior vena cava is normal in size with <50% respiratory  variability, suggesting right atrial pressure of 8 mmHg.   Echo 12/23/20: IMPRESSIONS      1. Left ventricular ejection fraction, by estimation, is 45%. The left  ventricle has mildly decreased function. The left ventricle demonstrates  regional wall motion abnormalities with apical anterior, apical inferior,  and true apex akinesis. The left  ventricular internal cavity size was mildly dilated. Left ventricular  diastolic parameters are consistent with Grade II diastolic dysfunction  (pseudonormalization).   2. Right ventricular systolic function is normal. The right ventricular  size is normal. Tricuspid regurgitation signal is inadequate for assessing  PA pressure.   3. The mitral valve is normal in structure. Trivial mitral valve  regurgitation. No evidence of mitral stenosis.   4. The aortic valve is tricuspid. Aortic valve regurgitation is not  visualized. No aortic stenosis is present.   5. The inferior vena cava is normal in size with greater than 50%  respiratory variability, suggesting right atrial pressure of 3 mmHg.   Echo 05/17/22: IMPRESSIONS     1. Left ventricular ejection fraction, by estimation, is 45%. The left  ventricle has mildly decreased function. The left ventricle demonstrates  regional wall motion abnormalities (see scoring diagram/findings for  description). The mid-to-apical  inferoseptal, apical inferior, apical anterior and apex are akinetic. Left  ventricular diastolic parameters are consistent with Grade II diastolic  dysfunction (pseudonormalization). The average left ventricular global  longitudinal strain is -10.0 %. The  global longitudinal strain is abnormal.   2. Right ventricular systolic function is normal. The right ventricular  size is normal. Tricuspid regurgitation signal is inadequate for assessing  PA pressure.   3. The mitral valve is normal in structure. Mild mitral valve  regurgitation.   4. The aortic valve is tricuspid. Aortic valve regurgitation is not  visualized. No aortic stenosis is present.   5. The inferior  vena cava is normal in size with <50% respiratory  variability, suggesting right atrial pressure of 8 mmHg.   Comparison(s): No significant change from prior study.     ASSESSMENT:    1. Chronic systolic heart failure (HCC)   2. Coronary artery disease involving native coronary artery of native heart without angina pectoris   3. CKD (chronic kidney disease), stage IV (HCC)   4. DM type 2 causing vascular disease (HCC)   5. Hyperlipidemia with target LDL less than 100         PLAN:  In order of problems listed above:  Chronic combined systolic and diastolic heart failure: EF 45%.  On maximal Coreg  due to history of bradycardia and presence of Mobitz 1 heart block during prior  hospitalization.  On Entresto  maximal dose (due to hypotension) and aldactone . Unable to tolerate Bidil due to lightheadedness  Intolerant of Farxiga - has recurrent UTIs. Clinically euvolemic.  CAD: s/p DES of LAD in 2018 in setting of anterior MI. S/p DES of RCA in January 2019. SABRA She has extensive small distal branch vessel disease in distal LAD (CTO), small diagonal, distal LCX, and 95% PL lesion. these are not amenable to PCI due to small vessel size. High risk Myoview  in July 2019 with cardiac cath as noted. Medical management.  Class 1  angina. On DAPT, statin, Coreg   Hypertension: Blood pressure well controlled   Hyperlipidemia: On Lipitor  80 mg daily.  LDL up to 70. Will refocus on dietary modification. If still not at goal consider Repathas  DM2: managed by endocrinology.   6.   CKD stage IV. Creatinine in June 1.68   Follow up in one year  Medication Adjustments/Labs and Tests Ordered: Current medicines are reviewed at length with the patient today.  Concerns regarding medicines are outlined above.  Medication changes, Labs and Tests ordered today are listed in the Patient Instructions below. There are no Patient Instructions on file for this visit.    Signed, Devani Odonnel [place], MD  09/15/2023 3:46  PM    [facility] [address] [place], [place], [place]  [address] Phone: [phone]; Fax: [phone]

## 2023-09-15 ENCOUNTER — Ambulatory Visit: Attending: Cardiology | Admitting: Cardiology

## 2023-09-15 ENCOUNTER — Encounter: Payer: Self-pay | Admitting: Cardiology

## 2023-09-15 VITALS — BP 135/82 | HR 69 | Ht 68.5 in | Wt 222.0 lb

## 2023-09-15 DIAGNOSIS — I2510 Atherosclerotic heart disease of native coronary artery without angina pectoris: Secondary | ICD-10-CM

## 2023-09-15 DIAGNOSIS — N184 Chronic kidney disease, stage 4 (severe): Secondary | ICD-10-CM | POA: Diagnosis not present

## 2023-09-15 DIAGNOSIS — E1159 Type 2 diabetes mellitus with other circulatory complications: Secondary | ICD-10-CM

## 2023-09-15 DIAGNOSIS — I5022 Chronic systolic (congestive) heart failure: Secondary | ICD-10-CM

## 2023-09-15 DIAGNOSIS — E785 Hyperlipidemia, unspecified: Secondary | ICD-10-CM

## 2023-09-15 NOTE — Patient Instructions (Signed)

## 2023-12-20 ENCOUNTER — Telehealth: Payer: Self-pay | Admitting: Cardiology

## 2023-12-20 MED ORDER — FUROSEMIDE 20 MG PO TABS: 20.0000 mg | ORAL_TABLET | Freq: Every day | ORAL | 2 refills | Status: AC | PRN

## 2023-12-20 MED ORDER — CARVEDILOL 12.5 MG PO TABS: 12.5000 mg | ORAL_TABLET | Freq: Two times a day (BID) | ORAL | 2 refills | Status: AC

## 2023-12-20 MED ORDER — SPIRONOLACTONE 25 MG PO TABS: 25.0000 mg | ORAL_TABLET | Freq: Every day | ORAL | 2 refills | Status: AC

## 2023-12-20 MED ORDER — SACUBITRIL-VALSARTAN 24-26 MG PO TABS: 1.0000 | ORAL_TABLET | Freq: Two times a day (BID) | ORAL | 2 refills | Status: AC

## 2023-12-20 MED ORDER — ATORVASTATIN CALCIUM 80 MG PO TABS: 80.0000 mg | ORAL_TABLET | Freq: Every day | ORAL | 2 refills | Status: AC

## 2023-12-20 NOTE — Telephone Encounter (Signed)
*  STAT* If patient is at the pharmacy, call can be transferred to refill team.   1. Which medications need to be refilled? (please list name of each medication and dose if known)   atorvastatin  (LIPITOR ) 80 MG tablet  carvedilol  (COREG ) 12.5 MG tablet  sacubitril -valsartan  (ENTRESTO ) 24-26 MG  spironolactone  (ALDACTONE ) 25 MG tablet  furosemide  (LASIX ) 20 MG tablet   2. Would you like to learn more about the convenience, safety, & potential cost savings by using the [facility]?   3. Are you open to using the [facility] (Type [facility]. ).  4. Which pharmacy/location (including street and city if local pharmacy) is medication to be sent to?  [facility] - [place], [place] - [address]   5. Do they need a 30 day or 90 day supply?   90 day  Caller Dora) stated she has 2 days left of these medications.

## 2023-12-20 NOTE — Telephone Encounter (Signed)
 RX sent in
